# Patient Record
Sex: Female | Born: 1945 | Race: Black or African American | Hispanic: No | Smoking: Never smoker
Health system: Southern US, Community
[De-identification: ages and names within clinical notes are randomized; demographics above are authoritative.]

## PROBLEM LIST (undated history)

## (undated) DIAGNOSIS — E119 Type 2 diabetes mellitus without complications: Secondary | ICD-10-CM

## (undated) DIAGNOSIS — N289 Disorder of kidney and ureter, unspecified: Secondary | ICD-10-CM

## (undated) DIAGNOSIS — H269 Unspecified cataract: Secondary | ICD-10-CM

## (undated) DIAGNOSIS — M1990 Unspecified osteoarthritis, unspecified site: Secondary | ICD-10-CM

## (undated) DIAGNOSIS — K219 Gastro-esophageal reflux disease without esophagitis: Secondary | ICD-10-CM

## (undated) DIAGNOSIS — H539 Unspecified visual disturbance: Secondary | ICD-10-CM

## (undated) DIAGNOSIS — E785 Hyperlipidemia, unspecified: Secondary | ICD-10-CM

## (undated) DIAGNOSIS — I824Z9 Acute embolism and thrombosis of unspecified deep veins of unspecified distal lower extremity: Secondary | ICD-10-CM

## (undated) HISTORY — PX: HYSTERECTOMY: SHX81

## (undated) HISTORY — PX: CHOLECYSTECTOMY: SHX55

## (undated) HISTORY — PX: APPENDECTOMY: SHX54

---

## 2009-12-20 ENCOUNTER — Emergency Department: Admit: 2009-12-20 | Payer: Self-pay | Source: Emergency Department | Admitting: Emergency Medical Services

## 2010-12-22 HISTORY — PX: AV FISTULA PLACEMENT: SHX1204

## 2011-02-03 ENCOUNTER — Ambulatory Visit
Admission: RE | Admit: 2011-02-03 | Disposition: A | Discharge status: Home / Self Care | Payer: Self-pay | Source: Ambulatory Visit | Admitting: Specialist

## 2011-02-03 LAB — CBC
Hematocrit: 31.2 % — ABNORMAL LOW (ref 37.0–47.0)
Hgb: 9.9 g/dL — ABNORMAL LOW (ref 12.0–16.0)
MCH: 28 pg (ref 28.0–32.0)
MCHC: 31.7 g/dL — ABNORMAL LOW (ref 32.0–36.0)
MCV: 88.4 fL (ref 80.0–100.0)
MPV: 11.8 fL (ref 9.4–12.3)
Platelets: 171 10*3/uL (ref 140–400)
RBC: 3.53 10*6/uL — ABNORMAL LOW (ref 4.20–5.40)
RDW: 14 % (ref 12–15)
WBC: 5.87 10*3/uL (ref 3.50–10.80)

## 2011-02-03 LAB — BASIC METABOLIC PANEL
Anion Gap: 11 (ref 5.0–15.0)
BUN: 54 mg/dL — ABNORMAL HIGH (ref 7–21)
CO2: 26 mEq/L (ref 22–31)
Calcium: 9.7 mg/dL (ref 8.6–10.2)
Chloride: 109 mEq/L — ABNORMAL HIGH (ref 98–107)
Creatinine: 2.3 mg/dL — ABNORMAL HIGH (ref 0.5–1.4)
Glucose: 105 mg/dL — ABNORMAL HIGH (ref 70–100)
Potassium: 4.2 mEq/L (ref 3.6–5.0)
Sodium: 146 mEq/L — ABNORMAL HIGH (ref 136–143)

## 2011-02-03 LAB — POTASSIUM WHOLE BLOOD: Whole Blood Potassium: 4 mEq/L (ref 3.6–5.0)

## 2011-02-03 LAB — GFR: EGFR: 25.6

## 2011-04-07 ENCOUNTER — Ambulatory Visit
Admission: RE | Admit: 2011-04-07 | Disposition: A | Discharge status: Home / Self Care | Payer: Self-pay | Source: Ambulatory Visit | Attending: Specialist | Admitting: Specialist

## 2011-06-26 ENCOUNTER — Inpatient Hospital Stay
Admission: AD | Admit: 2011-06-26 | Discharge: 2011-06-30 | Disposition: A | Discharge status: Home / Self Care | Payer: Self-pay | Source: Ambulatory Visit | Attending: Nephrology | Admitting: Nephrology

## 2011-06-27 LAB — COMPREHENSIVE METABOLIC PANEL
ALT: 23 U/L (ref 7–56)
AST (SGOT): 15 U/L (ref 5–40)
Albumin/Globulin Ratio: 1.2 (ref 1.1–1.8)
Albumin: 3.2 g/dL — ABNORMAL LOW (ref 3.7–5.1)
Alkaline Phosphatase: 78 U/L (ref 43–122)
Anion Gap: 9 (ref 5.0–15.0)
BUN: 24 mg/dL — ABNORMAL HIGH (ref 7–21)
Bilirubin, Total: 0.6 mg/dL (ref 0.2–1.3)
CO2: 22 mEq/L (ref 22–31)
Calcium: 8.9 mg/dL (ref 8.6–10.2)
Chloride: 113 mEq/L — ABNORMAL HIGH (ref 98–107)
Creatinine: 2.3 mg/dL — ABNORMAL HIGH (ref 0.5–1.4)
Globulin: 2.6 g/dL (ref 2.0–3.7)
Glucose: 130 mg/dL — ABNORMAL HIGH (ref 70–100)
Potassium: 4.6 mEq/L (ref 3.6–5.0)
Protein, Total: 5.8 g/dL — ABNORMAL LOW (ref 6.0–8.0)
Sodium: 144 mEq/L — ABNORMAL HIGH (ref 136–143)

## 2011-06-27 LAB — CBC AND DIFFERENTIAL
Basophils Absolute Automated: 0.02 10*3/uL (ref 0.00–0.20)
Basophils Automated: 0 % (ref 0–2)
Eosinophils Absolute Automated: 0.15 10*3/uL (ref 0.00–0.70)
Eosinophils Automated: 2 % (ref 0–5)
Hematocrit: 25.4 % — ABNORMAL LOW (ref 37.0–47.0)
Hgb: 8.1 g/dL — ABNORMAL LOW (ref 12.0–16.0)
Immature Granulocytes Absolute: 0.02 10*3/uL
Immature Granulocytes: 0 % (ref 0–1)
Lymphocytes Absolute Automated: 1.75 10*3/uL (ref 0.50–4.40)
Lymphocytes Automated: 24 % (ref 15–41)
MCH: 27.9 pg — ABNORMAL LOW (ref 28.0–32.0)
MCHC: 31.9 g/dL — ABNORMAL LOW (ref 32.0–36.0)
MCV: 87.6 fL (ref 80.0–100.0)
MPV: 10.9 fL (ref 9.4–12.3)
Monocytes Absolute Automated: 0.62 10*3/uL (ref 0.00–1.20)
Monocytes: 9 % (ref 0–11)
Neutrophils Absolute: 4.63 10*3/uL (ref 1.80–8.10)
Neutrophils: 65 % (ref 52–75)
Nucleated RBC: 0 /100 WBC
Platelets: 164 10*3/uL (ref 140–400)
RBC: 2.9 10*6/uL — ABNORMAL LOW (ref 4.20–5.40)
RDW: 15 % (ref 12–15)
WBC: 7.17 10*3/uL (ref 3.50–10.80)

## 2011-06-27 LAB — URINALYSIS WITH MICROSCOPIC
Bilirubin, UA: NEGATIVE
Blood, UA: NEGATIVE
Glucose, UA: NEGATIVE
Ketones UA: NEGATIVE
Leukocyte Esterase, UA: NEGATIVE
Nitrite, UA: NEGATIVE
Specific Gravity UA POCT: 1.009 (ref 1.001–1.035)
Urine pH: 6 (ref 5.0–8.0)
Urobilinogen, UA: NORMAL mg/dL

## 2011-06-27 LAB — PHOSPHORUS: Phosphorus: 3.5 mg/dL (ref 2.5–4.5)

## 2011-06-27 LAB — GFR: EGFR: 25.6

## 2011-06-27 LAB — MAGNESIUM: Magnesium: 1.9 mg/dL (ref 1.6–2.3)

## 2011-06-28 LAB — URINE CULTURE

## 2011-06-28 LAB — CBC AND DIFFERENTIAL
Absolute NRBC: 0 10*3/uL
Basophils Absolute Automated: 0.02 10*3/uL (ref 0.00–0.20)
Basophils Automated: 0 % (ref 0–2)
Eosinophils Absolute Automated: 0.13 10*3/uL (ref 0.00–0.70)
Eosinophils Automated: 2 % (ref 0–5)
Hematocrit: 26.8 % — ABNORMAL LOW (ref 37.0–47.0)
Hgb: 8.6 g/dL — ABNORMAL LOW (ref 12.0–16.0)
Immature Granulocytes Absolute: 0.01 10*3/uL
Immature Granulocytes: 0 % (ref 0–1)
Lymphocytes Absolute Automated: 2.43 10*3/uL (ref 0.50–4.40)
Lymphocytes Automated: 33 % (ref 15–41)
MCH: 28.3 pg (ref 28.0–32.0)
MCHC: 32.1 g/dL (ref 32.0–36.0)
MCV: 88.2 fL (ref 80.0–100.0)
MPV: 11.5 fL (ref 9.4–12.3)
Monocytes Absolute Automated: 0.59 10*3/uL (ref 0.00–1.20)
Monocytes: 8 % (ref 0–11)
Neutrophils Absolute: 4.17 10*3/uL (ref 1.80–8.10)
Neutrophils: 57 % (ref 52–75)
Nucleated RBC: 0 /100 WBC
Platelets: 173 10*3/uL (ref 140–400)
RBC: 3.04 10*6/uL — ABNORMAL LOW (ref 4.20–5.40)
RDW: 15 % (ref 12–15)
WBC: 7.34 10*3/uL (ref 3.50–10.80)

## 2011-06-28 LAB — COMPREHENSIVE METABOLIC PANEL
ALT: 26 U/L (ref 7–56)
AST (SGOT): 17 U/L (ref 5–40)
Albumin/Globulin Ratio: 1.1 (ref 1.1–1.8)
Albumin: 3.4 g/dL — ABNORMAL LOW (ref 3.7–5.1)
Alkaline Phosphatase: 82 U/L (ref 43–122)
Anion Gap: 12 (ref 5.0–15.0)
BUN: 21 mg/dL (ref 7–21)
Bilirubin, Total: 0.6 mg/dL (ref 0.2–1.3)
CO2: 24 mEq/L (ref 22–31)
Calcium: 8.5 mg/dL — ABNORMAL LOW (ref 8.6–10.2)
Chloride: 110 mEq/L — ABNORMAL HIGH (ref 98–107)
Creatinine: 2 mg/dL — ABNORMAL HIGH (ref 0.5–1.4)
Globulin: 3 g/dL (ref 2.0–3.7)
Glucose: 90 mg/dL (ref 70–100)
Potassium: 4.5 mEq/L (ref 3.6–5.0)
Protein, Total: 6.4 g/dL (ref 6.0–8.0)
Sodium: 146 mEq/L — ABNORMAL HIGH (ref 136–143)

## 2011-06-28 LAB — HEPATITIS C ANTIBODY: Hepatitis C, AB: NONREACTIVE

## 2011-06-28 LAB — HEPATITIS B SURFACE ANTIGEN W/ REFLEX TO CONFIRMATION: Hepatitis B Surface Antigen: NONREACTIVE

## 2011-06-28 LAB — PHOSPHORUS: Phosphorus: 4.2 mg/dL (ref 2.5–4.5)

## 2011-06-28 LAB — MAGNESIUM: Magnesium: 1.9 mg/dL (ref 1.6–2.3)

## 2011-06-28 LAB — GFR: EGFR: 30.1

## 2011-06-29 LAB — CBC AND DIFFERENTIAL
Basophils Absolute Automated: 0.02 10*3/uL (ref 0.00–0.20)
Basophils Automated: 0 % (ref 0–2)
Eosinophils Absolute Automated: 0.16 10*3/uL (ref 0.00–0.70)
Eosinophils Automated: 2 % (ref 0–5)
Hematocrit: 29.9 % — ABNORMAL LOW (ref 37.0–47.0)
Hgb: 9.5 g/dL — ABNORMAL LOW (ref 12.0–16.0)
Immature Granulocytes Absolute: 0.01 10*3/uL
Immature Granulocytes: 0 % (ref 0–1)
Lymphocytes Absolute Automated: 2.26 10*3/uL (ref 0.50–4.40)
Lymphocytes Automated: 29 % (ref 15–41)
MCH: 27.8 pg — ABNORMAL LOW (ref 28.0–32.0)
MCHC: 31.8 g/dL — ABNORMAL LOW (ref 32.0–36.0)
MCV: 87.4 fL (ref 80.0–100.0)
MPV: 11.4 fL (ref 9.4–12.3)
Monocytes Absolute Automated: 0.76 10*3/uL (ref 0.00–1.20)
Monocytes: 10 % (ref 0–11)
Neutrophils Absolute: 4.65 10*3/uL (ref 1.80–8.10)
Neutrophils: 59 % (ref 52–75)
Nucleated RBC: 1 /100 WBC
Platelets: 180 10*3/uL (ref 140–400)
RBC: 3.42 10*6/uL — ABNORMAL LOW (ref 4.20–5.40)
RDW: 15 % (ref 12–15)
WBC: 7.85 10*3/uL (ref 3.50–10.80)

## 2011-06-29 LAB — COMPREHENSIVE METABOLIC PANEL
ALT: 24 U/L (ref 7–56)
AST (SGOT): 17 U/L (ref 5–40)
Albumin/Globulin Ratio: 1.3 (ref 1.1–1.8)
Albumin: 3.7 g/dL (ref 3.7–5.1)
Alkaline Phosphatase: 94 U/L (ref 43–122)
Anion Gap: 11 (ref 5.0–15.0)
BUN: 20 mg/dL (ref 7–21)
Bilirubin, Total: 0.7 mg/dL (ref 0.2–1.3)
CO2: 26 mEq/L (ref 22–31)
Calcium: 8.6 mg/dL (ref 8.6–10.2)
Chloride: 106 mEq/L (ref 98–107)
Creatinine: 1.9 mg/dL — ABNORMAL HIGH (ref 0.5–1.4)
Globulin: 2.9 g/dL (ref 2.0–3.7)
Glucose: 141 mg/dL — ABNORMAL HIGH (ref 70–100)
Potassium: 4.5 mEq/L (ref 3.6–5.0)
Protein, Total: 6.6 g/dL (ref 6.0–8.0)
Sodium: 143 mEq/L (ref 136–143)

## 2011-06-29 LAB — MAGNESIUM: Magnesium: 1.9 mg/dL (ref 1.6–2.3)

## 2011-06-29 LAB — PHOSPHORUS: Phosphorus: 4.2 mg/dL (ref 2.5–4.5)

## 2011-06-29 LAB — GFR: EGFR: 31.9

## 2011-06-30 LAB — COMPREHENSIVE METABOLIC PANEL
ALT: 29 U/L (ref 7–56)
AST (SGOT): 34 U/L (ref 5–40)
Albumin/Globulin Ratio: 1.4 (ref 1.1–1.8)
Albumin: 3.8 g/dL (ref 3.7–5.1)
Alkaline Phosphatase: 87 U/L (ref 43–122)
Anion Gap: 13 (ref 5.0–15.0)
BUN: 20 mg/dL (ref 7–21)
Bilirubin, Total: 0.6 mg/dL (ref 0.2–1.3)
CO2: 26 mEq/L (ref 22–31)
Calcium: 8.7 mg/dL (ref 8.6–10.2)
Chloride: 102 mEq/L (ref 98–107)
Creatinine: 2 mg/dL — ABNORMAL HIGH (ref 0.5–1.4)
Globulin: 2.8 g/dL (ref 2.0–3.7)
Glucose: 183 mg/dL — ABNORMAL HIGH (ref 70–100)
Potassium: 4.5 mEq/L (ref 3.6–5.0)
Protein, Total: 6.6 g/dL (ref 6.0–8.0)
Sodium: 141 mEq/L (ref 136–143)

## 2011-06-30 LAB — PHOSPHORUS: Phosphorus: 4.4 mg/dL (ref 2.5–4.5)

## 2011-06-30 LAB — CBC AND DIFFERENTIAL
Basophils Absolute Automated: 0.02 10*3/uL (ref 0.00–0.20)
Basophils Automated: 0 % (ref 0–2)
Eosinophils Absolute Automated: 0.14 10*3/uL (ref 0.00–0.70)
Eosinophils Automated: 2 % (ref 0–5)
Hematocrit: 28.5 % — ABNORMAL LOW (ref 37.0–47.0)
Hgb: 9.2 g/dL — ABNORMAL LOW (ref 12.0–16.0)
Immature Granulocytes Absolute: 0.01 10*3/uL
Immature Granulocytes: 0 % (ref 0–1)
Lymphocytes Absolute Automated: 2.13 10*3/uL (ref 0.50–4.40)
Lymphocytes Automated: 25 % (ref 15–41)
MCH: 28.1 pg (ref 28.0–32.0)
MCHC: 32.3 g/dL (ref 32.0–36.0)
MCV: 87.2 fL (ref 80.0–100.0)
MPV: 10.9 fL (ref 9.4–12.3)
Monocytes Absolute Automated: 0.69 10*3/uL (ref 0.00–1.20)
Monocytes: 8 % (ref 0–11)
Neutrophils Absolute: 5.39 10*3/uL (ref 1.80–8.10)
Neutrophils: 64 % (ref 52–75)
Nucleated RBC: 0 /100 WBC
Platelets: 164 10*3/uL (ref 140–400)
RBC: 3.27 10*6/uL — ABNORMAL LOW (ref 4.20–5.40)
RDW: 15 % (ref 12–15)
WBC: 8.37 10*3/uL (ref 3.50–10.80)

## 2011-06-30 LAB — MAGNESIUM: Magnesium: 1.9 mg/dL (ref 1.6–2.3)

## 2011-06-30 LAB — GFR: EGFR: 30.1

## 2011-08-15 ENCOUNTER — Ambulatory Visit
Admission: RE | Admit: 2011-08-15 | Discharge: 2011-08-15 | Disposition: A | Discharge status: Home / Self Care | Payer: Self-pay | Source: Ambulatory Visit | Attending: Specialist | Admitting: Specialist

## 2011-09-02 LAB — ECG 12-LEAD
Atrial Rate: 74 {beats}/min
P Axis: 60 degrees
P-R Interval: 168 ms
Q-T Interval: 398 ms
QRS Duration: 88 ms
QTC Calculation (Bezet): 441 ms
R Axis: -20 degrees
T Axis: 43 degrees
Ventricular Rate: 74 {beats}/min

## 2011-09-04 LAB — ECG 12-LEAD
Atrial Rate: 75 {beats}/min
P Axis: 44 degrees
P-R Interval: 138 ms
Q-T Interval: 406 ms
QRS Duration: 82 ms
QTC Calculation (Bezet): 453 ms
R Axis: -23 degrees
T Axis: 17 degrees
Ventricular Rate: 75 {beats}/min

## 2011-10-22 NOTE — Op Note (Signed)
Mary [name], Mary [name]      MRN:          [id number]      Account:      [id number]      Document ID:  [id number]      Procedure Date: 04/07/2011            Admit Date: 04/07/2011            Patient Location: DISCHARGED 04/07/2011      Patient Type: A            SURGEON: Shanira Tine A Djuna Frechette MD      ASSISTANT:                  PREOPERATIVE DIAGNOSIS:      Chronic renal failure.            POSTOPERATIVE DIAGNOSIS:      Chronic renal failure.            TITLE OF PROCEDURE:      Left cephalic vein transposition.            ANESTHESIA:      Local sedation.            BRIEF HISTORY:      This is a 65 year old female with history of chronic renal failure who has      a left proximal radial arteriovenous fistula formation; however, a cephalic      vein which is maturing, but it is very deep.  Therefore, decision was made      to proceed with the above procedure.  The diameter of cephalic vein is      marginal.  The patient may require a patch angioplasty at a later date.            DESCRIPTION OF PROCEDURE:      The patient was brought to the suite.  Ultrasound was used, and the      cephalic vein was marked from elbow up to the shoulder.  Then, local      anesthesia was administered, 3 small incisions were made after the arm was      prepped and draped in sterile fashion.  Then incisions were made and the      cephalic vein was dissected out.  The cephalic vein was marginal      approximately 6 mm.  Near the arterial anastomosis the vein was clamped and      was divided in a beveled fashion.  There were some neointimal hyperplasia      formation in this area.  The vein was brought out of its bed.  All the side      branches were ligated using Hemoclips.  The vein was dilated and again      measured approximately 5 to 6 mm.  A tunnel was created after local      anesthesia was administered in a subcutaneous fashion, and then a      venovenostomy was created using 7-0 PDS using 2 separate sutures in a      running fashion.  Flow  was restored.  There was reasonable thrill in the      fistula.  There was no evidence of bleeding.  Wounds were closed using 3-0      Monocryl in a running fashion.  Skin was closed using 4-0 Monocryl in      subcuticular fashion.  Steri-Strips were applied.  Patient tolerated the  Page 1 of 2      NORMANI, BORNTREGER      MRN:          [id number]      Account:      [id number]      Document ID:  [id number]      Procedure Date: 04/07/2011            procedure well.  Needle count, sponge count was correct.  She was      transferred to the recovery room.                        Electronic Signing Provider            D:  04/07/2011 15:13 PM by Dr. Ann Lions A. Lutricia Feil, MD ([id number])      T:  04/07/2011 22:05 PM by [id number]                  [id number] Ikhinmwin MD                                   Page 2 of 2      Authenticated by [facility] A. Lutricia Feil, MD ([id number]) On 04/09/2011 10:56:25 AM

## 2011-10-22 NOTE — Op Note (Signed)
Mary [name], Mary [name]      MRN:          [id number]      Account:      [id number]      Document ID:  [id number]      Procedure Date: 02/03/2011            Admit Date: 02/03/2011            Patient Location: DISCHARGED 02/03/2011      Patient Type: A            SURGEON: Jakaya Jacobowitz A Bindi Klomp MD      ASSISTANT:                  PREOPERATIVE DIAGNOSIS:      Chronic renal failure.            POSTOPERATIVE DIAGNOSIS:      Chronic renal failure.            TITLE OF PROCEDURE:      Creation of left proximal radial arteriovenous fistula.            ANESTHESIA:      Local with sedation.            BRIEF HISTORY:      This is a 65 year old female with history of chronic renal failure who is      in need of long-term dialysis access.  Therefore, the decision was made to      proceed with the above procedure.  Risks and benefits were stated and      agreed.            DESCRIPTION OF PROCEDURE:      The patient was brought to the operating suite, was placed in supine      position.  Left arm was ultrasounded and the antebrachial vein appeared to      be patent and feeding into the cephalic vein.  The basilic vein was small      and possibly had some communication.  Then, we proceeded with prepping and      draping the left arm.  A small incision was made in the antecubital area.      Antebrachial vein was dissected out and ligated using Hemoclips.  The vein      was dilated and appeared to be of reasonable size.  Then, proximal radial      artery, which had a reasonable pulse, was dissected out.  Then, we      proceeded with applying bulldog clamps to the radial artery proximal and      distal arteries and then arteriotomy was made using 11 blade and an      end-to-side anastomosis was created using 7-0 PDS in a running fashion.      Flow was restored.  There was a reasonable thrill in the fistula.  The vein      was set still somewhat in spasm.  Then there was no evidence of bleeding.      Wound was closed using 3-0 Monocryl in  running fashion.  Skin was closed      using 4-0 Monocryl in subcuticular fashion.  Steri-Strips were applied.      The patient tolerated the procedure well, was transferred to recovery room.  Page 1 of 2      LEANDREA, SCAGNELLI      MRN:          [id number]      Account:      [id number]      Document ID:  [id number]      Procedure Date: 02/03/2011                        Electronic Signing Provider            D:  02/03/2011 15:31 PM by Dr. Ann Lions A. Lutricia Feil, MD ([id number])      T:  02/03/2011 22:40 PM by [id number]                  cc:                                   Page 2 of 2      Authenticated by [facility] A. Lutricia Feil, MD ([id number]) On 02/05/2011 08:00:53 AM

## 2011-11-08 NOTE — H&P (Signed)
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  The patient was admitted to [facility] because of increasing shortness of breath, difficulty      breathing, rapid increasing weight over the past couple of months of about      25 to 30 pounds, significant swelling of the bilateral lower extremities      resulting in extreme difficulty in the patient ambulating or wearing shoes,      pain in lower extremities because of the swelling.  The patient's      creatinine clearance has recently dropped to about 15 to 20 mL per minute.      The patient had a left arm AV fistula created by Dr. Lutricia Feil on February 03, 2011.  Left arm AV fistula is functioning well, has good bruit and      thrill.  The patient has agreed to be started on hemodialysis.  Plan is for      patient to be initiated on hemodialysis during this admission.  The patient      denies any chest pain at this time.            PAST MEDICAL HISTORY:      Notable for end-stage kidney failure secondary to diabetic nephropathy and      hypertensive nephrosclerosis, history of hypertension, history of diabetes      mellitus, history of hyperlipidemia, history of a hematological disorder.            ALLERGIES:      The patient has allergy to PENICILLIN.            SOCIAL HISTORY:      The patient does not smoke, drink, or use drugs.            MEDICATIONS:      At home include Glimepiride 2 mg daily, Norvasc 10 mg daily, Carvita  150 mg      daily, Nexium 40 mg daily, Citracal 1 tablet daily, aspirin 81 mg daily,      sodium bicarbonate 650 mEq twice a day, Torsemide 20 mg daily, Lyrica 75 mg      twice a day, Lipitor 10 mg daily.            REVIEW OF SYSTEMS:      Notable for shortness of breath, difficulty breathing, bilateral lower      extremity swelling, weakness.  No chest pain, no nausea, no vomiting.  Page 1 of 2      Mary [name], Mary [name]      MRN:          [id number]      Account:      [id number]      Document ID:  [id number]                        PHYSICAL EXAMINATION:      GENERAL:  Elderly woman lying in bed in no acute respiratory distress.      VITAL SIGNS:  Blood pressure 168/65, temperature of 98.9, pulse of 82,      respiratory rate of 18.      HEENT:  Normocephalic, atraumatic.      NECK:  Supple.      LUNGS:  Decreased breath sounds.      CARDIOVASCULAR:  Normal first and second heart sounds, no S3, no S4.      ABDOMEN:  Soft, slightly obese, nontender, no masses, normal bowel sounds.      EXTREMITIES:  4+ bilateral lower extremity edema.            LABORATORY DATA:      WBC of 7.17, hematocrit of 25.4, platelet of 164.  Urinalysis was positive      for protein.  _____ pending.  Creatinine clearance is about 15 mL per      minute.            IMPRESSION:      1. End-stage kidney failure.      2.  Anemia.            PLAN:      The patient is admitted to the medical floor. We will start patient on      Aranesp 100 mcg ever week.  We will initiate hemodialysis. We will resume      patient's other medications.  I have discussed plan of care with patient      and the patient's family.                                    D:  06/27/2011 09:09 AM by Dr. Gwenith Daily. Leonette Most, MD [phone])      T:  06/27/2011 09:39 AM by [id number]                  cc:                                   Page 2 of 2      Authenticated by Gwenith Daily. Leonette Most, MD [phone]) On 06/27/2011 11:04:42 AM

## 2011-11-08 NOTE — Op Note (Signed)
Mary [name], Mary [name]      MRN:          [id number]      Account:      [id number]      Document ID:  [id number]      Procedure Date: 08/15/2011            Admit Date: 08/15/2011            Patient Location: DISCHARGED 08/15/2011      Patient Type: A            SURGEON: Emaly Boschert A Mariadelosang Wynns MD      ASSISTANT:                  PREOPERATIVE DIAGNOSIS:      Chronic renal failure with non-matured left arm cephalic arteriovenous      fistula.            POSTOPERATIVE DIAGNOSIS:      Chronic renal failure with non-matured left arm cephalic arteriovenous      fistula.            TITLE OF PROCEDURE:      Revision of a left cephalic vein transposition.            PROCEDURES:      Revision of left cephalic vein transposition with patch angioplasty.            ASSISTING SURGEON:      Vecente Campoverde.            BRIEF HISTORY:      This is a 65 year old female with history of chronic renal failure who had      undergone left cephalic vein transposition.  There has been difficulty      accessing the cephalic vein; therefore, decision was made to proceed with      patch angioplasty more proximal aspect of the vein which is rather deep.      Risks and benefits were stated and agreed.            DESCRIPTION OF PROCEDURE:      The patient was brought to the operating suite.  No IV could be obtained by      the anesthesia staff.  Therefore, under ultrasound in sterile technique, a      right brachial IV was inserted.  Two grams of Ancef were given      intravenously.  Left arm was prepped and draped in sterile fashion.  Local      anesthesia was administered, and in the mid upper arm approximately 8 cm of      the cephalic vein, which was rather diseased because of multiple previous      puncture sites and hematoma was dissected out, 5000 units of heparin were      given intravenously.  Then, the cephalic vein was clamped using a #5      [place]ogarty and centrally after it flushed was clamped using a bulldog clamp      and then a 2-cm  pericardial patch was used with 6-0 Prolene in running      fashion, and patch angioplasty was performed.  Then, flow was restored.                                   Page 1 of 2      AIDEL, BINGAMAN      MRN:  [id number]      Account:      [id number]      Document ID:  [id number]      Procedure Date: 08/15/2011            There was excellent thrill in the fistula.  50 mg of protamine were given      intravenously and then excess fat in the subcutaneous tissue was removed      and we proceeded with raising flaps and skin was closed using 3-0 Monocryl      and Indermil.  Arm was wrapped a sterile dressings applied.  The patient      tolerated the procedure well, was transferred to the recovery room.                        Electronic Signing Provider            D:  08/15/2011 17:44 PM by Dr. Ann Lions A. Lutricia Feil, MD ([id number])      T:  08/16/2011 12:03 PM by [id number]                  cc:     Emelda Fear MD                                   Page 2 of 2      Authenticated and Edited by Trixie Dredge. Lutricia Feil, MD ([id number]) On 08/18/11 8:50:31 AM

## 2011-11-08 NOTE — Discharge Summary (Signed)
Mary [name], Mary [name]      MRN:          [id number]      Account:      [id number]      Document ID:  [id number]                  Admit Date: 06/26/2011      Discharge Date: 06/30/2011            ATTENDING PHYSICIAN:  Emelda Fear, MD                  DISCHARGE DIAGNOSIS:      End-stage kidney failure.            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  The patient      also has a hematological disorder.  The patient was admitted to [facility] on June 26, 2011 because of shortness of breath, difficulty      breathing, swelling of the lower extremities resulting in difficulty with      ambulation.  The patient had gained about 20 pounds within the past month.      The patient had a creatinine clearance done, which was less than 20 mL per      minute.  The patient had a left arm AV fistula created in February 2012.      The fistula was functioning well by the time of admission.  Hemodialysis      was initiated via the left arm AV fistula.            After the patient was admitted to the medical floor, the patient tolerated      hemodialysis very well.  During this admission, the patient also was      evaluated by the social work service.  An outpatient dialysis facility was      identified.  The patient was subsequently placed in the outpatient      hemodialysis facility.  Plan was for patient to resume hemodialysis in the      outpatient clinic on Tuesday, Thursday, and Saturday.  At the time of      discharge, the patient was feeling better.  Swelling of the lower      extremities has improved.  Shortness of breath and difficulty breathing      also has resolved.            DISCHARGE MEDICATIONS:      Glimepiride 2 mg daily, Norvasc 10 mg daily, Corvita 150 mg daily, Nexium      40 mg daily, Citracal 1 tablet  daily, aspirin 81 mg daily, sodium      bicarbonate 5 grams b.i.d., torsemide 20 mg daily, Lyrica 75 mg twice a      day, Lipitor 10 mg nightly.            PLAN ON DISCHARGE:      The patient was to follow up in the outpatient dialysis clinic in 1 week.            DISCHARGE DIAGNOSIS:      As listed above.  Page 1 of 2      Mary [name], Mary [name]      MRN:          [id number]      Account:      [id number]      Document ID:  [id number]                                    D:  07/09/2011 10:54 AM by Dr. Gwenith Daily. Leonette Most, MD [phone])      T:  07/09/2011 12:24 PM by BS                        cc:                                   Page 2 of 2      Authenticated by Gwenith Daily. Leonette Most, MD [phone]) On 07/14/2011 11:05:25 AM

## 2011-11-17 ENCOUNTER — Emergency Department
Admit: 2011-11-17 | Discharge: 2011-11-17 | Disposition: A | Discharge status: Home / Self Care | Payer: Self-pay | Source: Emergency Department

## 2011-11-17 LAB — ECG 12-LEAD
Atrial Rate: 88 {beats}/min
P Axis: 68 degrees
P-R Interval: 152 ms
Q-T Interval: 368 ms
QRS Duration: 88 ms
QTC Calculation (Bezet): 445 ms
R Axis: -18 degrees
T Axis: 41 degrees
Ventricular Rate: 88 {beats}/min

## 2011-12-23 DIAGNOSIS — N186 End stage renal disease: Secondary | ICD-10-CM

## 2011-12-23 DIAGNOSIS — I82419 Acute embolism and thrombosis of unspecified femoral vein: Secondary | ICD-10-CM | POA: Insufficient documentation

## 2011-12-23 HISTORY — DX: End stage renal disease: N18.6

## 2011-12-31 ENCOUNTER — Encounter
Admission: RE | Disposition: A | Discharge status: Home / Self Care | Payer: Self-pay | Source: Ambulatory Visit | Attending: Specialist

## 2011-12-31 ENCOUNTER — Ambulatory Visit: Payer: Medicare Other | Admitting: Specialist

## 2011-12-31 ENCOUNTER — Ambulatory Visit
Admission: RE | Admit: 2011-12-31 | Discharge: 2011-12-31 | Disposition: A | Discharge status: Home / Self Care | Payer: Medicare Other | Source: Ambulatory Visit | Attending: Specialist | Admitting: Specialist

## 2011-12-31 DIAGNOSIS — N186 End stage renal disease: Secondary | ICD-10-CM | POA: Insufficient documentation

## 2011-12-31 DIAGNOSIS — E785 Hyperlipidemia, unspecified: Secondary | ICD-10-CM | POA: Insufficient documentation

## 2011-12-31 DIAGNOSIS — E1129 Type 2 diabetes mellitus with other diabetic kidney complication: Secondary | ICD-10-CM | POA: Insufficient documentation

## 2011-12-31 DIAGNOSIS — I871 Compression of vein: Secondary | ICD-10-CM | POA: Insufficient documentation

## 2011-12-31 DIAGNOSIS — T82898A Other specified complication of vascular prosthetic devices, implants and grafts, initial encounter: Secondary | ICD-10-CM | POA: Insufficient documentation

## 2011-12-31 DIAGNOSIS — Z992 Dependence on renal dialysis: Secondary | ICD-10-CM | POA: Insufficient documentation

## 2011-12-31 DIAGNOSIS — Y832 Surgical operation with anastomosis, bypass or graft as the cause of abnormal reaction of the patient, or of later complication, without mention of misadventure at the time of the procedure: Secondary | ICD-10-CM | POA: Insufficient documentation

## 2011-12-31 DIAGNOSIS — I120 Hypertensive chronic kidney disease with stage 5 chronic kidney disease or end stage renal disease: Secondary | ICD-10-CM | POA: Insufficient documentation

## 2011-12-31 DIAGNOSIS — N058 Unspecified nephritic syndrome with other morphologic changes: Secondary | ICD-10-CM | POA: Insufficient documentation

## 2011-12-31 HISTORY — DX: Hyperlipidemia, unspecified: E78.5

## 2011-12-31 HISTORY — DX: Unspecified cataract: H26.9

## 2011-12-31 HISTORY — DX: Gastro-esophageal reflux disease without esophagitis: K21.9

## 2011-12-31 HISTORY — DX: Unspecified osteoarthritis, unspecified site: M19.90

## 2011-12-31 LAB — POCT GLUCOSE: Whole Blood Glucose POCT: 187 mg/dL — AB (ref 70–100)

## 2011-12-31 SURGERY — VENOUS - UPPER FISTULA ANGIO; POSS PTA &/OR THROMBO
Anesthesia: Conscious Sedation | Laterality: Left

## 2011-12-31 MED ORDER — LIDOCAINE HCL (PF) 1 % IJ SOLN
INTRAMUSCULAR | Status: AC
Start: 2011-12-31 — End: 2011-12-31
  Administered 2011-12-31: 10 mL
  Filled 2011-12-31: qty 1

## 2011-12-31 MED ORDER — SODIUM CHLORIDE 0.9 % IV SOLN
INTRAVENOUS | Status: DC
Start: 2011-12-31 — End: 2011-12-31

## 2011-12-31 MED ORDER — HEPARIN WASH BOWL 5 UNITS/ML SOLN (CATH LAB)
Status: AC
Start: 2011-12-31 — End: 2011-12-31
  Filled 2011-12-31: qty 1

## 2011-12-31 MED ORDER — IODIXANOL 320 MG/ML IV SOLN
15.00 mL | Freq: Once | INTRAVENOUS | Status: AC
Start: 2011-12-31 — End: 2011-12-31
  Administered 2011-12-31: 15 mL via INTRAMUSCULAR
  Filled 2011-12-31: qty 50

## 2011-12-31 MED ORDER — ACETAMINOPHEN 325 MG PO TABS
650.0000 mg | ORAL_TABLET | ORAL | Status: DC | PRN
Start: 2011-12-31 — End: 2011-12-31

## 2011-12-31 NOTE — Progress Notes (Signed)
Unable to start peripheral IV or get CBC, Dr. Lutricia Feil aware. States we do not need PIV and has obtained CBC from Dialysis center.

## 2011-12-31 NOTE — H&P (Signed)
PLEASE SEE PREVIOUS H&P DONE BY DR HASHEMI ON 10/07/11 FOR ADDITIONAL INFORMATION IN CHART.    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 The patient   also has a hematological disorder. The patient was admitted to [facility] on June 26, 2011 because of shortness of breath, difficulty   breathing, swelling of the lower extremities resulting in difficulty with   Ambulation.     NOW WITH AV ACCESS POOR HEMODIALYSIS DUE TO OUTFLOW STENOSIS    MEDS: SEE LIST    ALL: NKDA    ROS: DENIES ALL    PE: A/O X3 NAD  LUE AVF PULSES WITH    THRILL    A/P: 66 YO F  AVSS WITH L AVF STENOSIS AND PENDING FISTULOGRAM.

## 2011-12-31 NOTE — Discharge Summary (Signed)
S/p LUE AV graftogram, s/p balloon angioplasty x2, tolerated well.

## 2011-12-31 NOTE — Discharge Instructions (Signed)
You may resume all prior medications. Take Tylenol for pain as needed.    You may use the graft as soon as needed.    Follow up with Dr. Lutricia Feil in 4 weeks. Please get an Ultrasound of the left arm graft prior to the appointment. A prescription order for the Ultrasound will be provided upon discharge.

## 2011-12-31 NOTE — Progress Notes (Signed)
Discharge instructions reviewed with pt.  Pt verbalizes understanding.  Pt ambulates around unit.  Pt tele d/c, pt did not have PIV.

## 2012-01-01 NOTE — Op Note (Signed)
Procedure Date: 12/31/2011     Patient Type: A     SURGEON: Charlyne Robertshaw A Divante Kotch MD  ASSISTANT:  Wendee Copp MD     PREOPERATIVE DIAGNOSIS:  Chronic renal failure with stenosis, left cephalic vein transposition.     POSTOPERATIVE DIAGNOSIS:  Chronic renal failure with stenosis, left cephalic vein transposition.     TITLE OF PROCEDURE:  1.  Intraoperative ultrasound.  2.  Left arm fistulogram.  3.  Venogram of central venous system.  4.  Balloon angioplasty, left cephalic vein.     ANESTHESIA:  Local.     BRIEF HISTORY:  The patient has history of chronic renal failure and has a left arm  cephalic vein transposition.  The patient had undergone ultrasound  examination which revealed evidence of significant stenosis a few  centimeters central to the arterial anastomosis.  Therefore, decision was  made to proceed with the above procedure.  Risks and benefits were stated  and agreed.     DESCRIPTION OF PROCEDURE:  The patient was brought to the catheterization lab.  They could not start  IV.  Therefore, I decided to use the ultrasound and a micropuncture was  used after left arm was prepped in sterile fashion and left arm was  cannulated with a micropuncture.  At this time, fistulogram was obtained,  which showed 2 areas of stenoses, one a few centimeters proximal to the  arterial anastomosis and one in the upper arm.  The cephalic arch also had  some degree of very mild stenosis with a few collaterals but did not appear  to be hemodynamically significant.  The subclavian vein was visualized and  appeared to be patent.  Then, venogram of the innominate vein and proximal  superior vena cava was obtained and appeared to be patent.  At this time, a  6-French sheath was inserted, 0.035 wire was placed and a 9 mm x 4 cm  Mustang balloon was used and both areas of stenoses were balloon  angioplastied.  There was no significant waisting.  Therefore, we went up  to a 12 x 4 Mustang balloon and there appeared to be some waisting,  which  was eliminated.  At this time there was excellent thrill in the fistula.   Followup fistulogram revealed the more central stenosis to be much better,  and more distal one appeared there could be some tortuosity involving the  vein.  At this time, the patient had excellent thrill, and since there was  no more waisting, we decided to leave this alone and 3-0 Monocryl  pursestring suture was placed and sheath was removed.  The patient  tolerated the procedure well, was transferred to the recovery room.           D:  12/31/2011 12:41 PM by Dr. Ann Lions A. Lutricia Feil, MD ([id number])  T:  12/31/2011 19:45 PM by [id number]      Lorin [id number]) (Doc ID: [id number])

## 2012-08-02 ENCOUNTER — Emergency Department
Admission: EM | Admit: 2012-08-02 | Discharge: 2012-08-02 | Disposition: A | Discharge status: Home / Self Care | Payer: Medicare Other | Attending: Emergency Medicine | Admitting: Emergency Medicine

## 2012-08-02 ENCOUNTER — Emergency Department: Payer: Medicare Other

## 2012-08-02 DIAGNOSIS — K219 Gastro-esophageal reflux disease without esophagitis: Secondary | ICD-10-CM | POA: Insufficient documentation

## 2012-08-02 DIAGNOSIS — N186 End stage renal disease: Secondary | ICD-10-CM | POA: Insufficient documentation

## 2012-08-02 DIAGNOSIS — I120 Hypertensive chronic kidney disease with stage 5 chronic kidney disease or end stage renal disease: Secondary | ICD-10-CM | POA: Insufficient documentation

## 2012-08-02 DIAGNOSIS — M7989 Other specified soft tissue disorders: Secondary | ICD-10-CM | POA: Insufficient documentation

## 2012-08-02 DIAGNOSIS — M19079 Primary osteoarthritis, unspecified ankle and foot: Secondary | ICD-10-CM | POA: Insufficient documentation

## 2012-08-02 DIAGNOSIS — E7800 Pure hypercholesterolemia, unspecified: Secondary | ICD-10-CM | POA: Insufficient documentation

## 2012-08-02 DIAGNOSIS — E119 Type 2 diabetes mellitus without complications: Secondary | ICD-10-CM | POA: Insufficient documentation

## 2012-08-02 MED ORDER — TRAMADOL HCL 50 MG PO TABS
50.00 mg | ORAL_TABLET | Freq: Four times a day (QID) | ORAL | Status: DC | PRN
Start: 2012-08-02 — End: 2018-04-08

## 2012-08-02 MED ORDER — TRAMADOL HCL 50 MG PO TABS
50.00 mg | ORAL_TABLET | Freq: Once | ORAL | Status: AC
Start: 2012-08-02 — End: 2012-08-02
  Administered 2012-08-02: 50 mg via ORAL
  Filled 2012-08-02: qty 1

## 2012-08-02 NOTE — ED Provider Notes (Signed)
[facility] EMERGENCY DEPARTMENT HISTORY AND PHYSICAL EXAM    Date: 08/02/2012  Patient Name: Mary [name]  Attending Physician: Jerline Pain, MD  Patient DOB:  18-Jun-1946  MRN:  [id number]  Room:  06/A06    Patient was evaluated by ED physician, Jerline Pain, MD at 3:56 PM    History     Chief Complaint   Patient presents with   . Leg Pain     right       Onset:   10 days  Severity:  moderate      The patient Mary [name], is a 66 y.o. female who presents with chief complaint of gradually worsening RLE pain and swelling for 10 days. Pt has a h/o ESRD on HD, remote h/o DVT, DM, HTN and chronic BLE swelling. Pt saw her podiatrist for sx 1 wk ago and had an ankle cortisone injection without improvement.  Pt referred to ED to r/o DVT. Pt denies any SOB, no back pain, no focal weakness.    PCP:  Alric Quan, MD  Podiatry: Dr Araceli Bouche      Past Medical History       Past Medical History   Diagnosis Date   . Diabetes mellitus without complication    . Hyperlipidemia    . Hypertensive disorder    . Chronic kidney disease      dialysis t-tr-sat   . Arthritis    . Cataracts, bilateral    . GERD (gastroesophageal reflux disease)          Past Surgical History       Past Surgical History   Procedure Date   . Av fistula placement 2012         Family History    History reviewed. No pertinent family history.    Social History    History     Social History   . Marital Status: Widowed     Spouse Name: N/A     Number of Children: N/A   . Years of Education: N/A     Social History Main Topics   . Smoking status: Never Smoker    . Smokeless tobacco: None   . Alcohol Use: No   . Drug Use: No   . Sexually Active:      Other Topics Concern   . None     Social History Narrative   . None       Allergies    Allergies   Allergen Reactions   . Penicillins Hives   . Shrimp (Shellfish Allergy) Swelling         Current/Home Medications    Current/Home Medications    ACETAMINOPHEN (TYLENOL) 500 MG TABLET    Take 500 mg by mouth every  6 (six) hours as needed.      AMLODIPINE (NORVASC) 10 MG TABLET    Take 10 mg by mouth daily.      ASPIRIN 81 MG TABLET    Take 81 mg by mouth daily.      ATORVASTATIN (LIPITOR) 10 MG TABLET    Take 10 mg by mouth daily.      B COMPLEX-VITAMIN C-FOLIC ACID (NEPHRO-VITE) 0.8 MG TABS    Take 0.8 mg by mouth.    BISACODYL (DULCOLAX) 5 MG EC TABLET    Take 5 mg by mouth daily as needed.      ESOMEPRAZOLE (NEXIUM) 40 MG CAPSULE    Take 40 mg by mouth every morning before breakfast.  INSULIN ASPART (NOVOLOG) 100 UNIT/ML INJECTION    Inject 1-10 Units into the skin 3 (three) times daily before meals.      INSULIN GLARGINE (LANTUS) 100 UNIT/ML INJECTION    Inject 10-20 Units into the skin nightly.      PREGABALIN (LYRICA) 75 MG CAPSULE    Take 75 mg by mouth 2 (two) times daily.         Vital Signs     BP 140/64  Pulse 72  Temp(Src) 98 F (36.7 C) (Temporal Artery)  Resp 18  Ht 1.6 m  Wt 94.5 kg  BMI 36.91 kg/m2  SpO2 98%  Patient Vitals for the past 24 hrs:   BP Temp Temp src Pulse Resp SpO2 Height Weight   08/02/12 1720 140/64 mmHg - - 72  18  98 % - -   08/02/12 1536 148/64 mmHg 98 F (36.7 C) Temporal Art 83  16  97 % 1.6 m 94.5 kg         Review of Systems   Review of Systems   Constitutional: Negative for fever.   Musculoskeletal: Positive for myalgias. Negative for back pain and falls.   Neurological: Negative for weakness.   All other systems reviewed and are negative.        Physical Exam     CONSTITUTIONAL  Patient is afebrile, Vital signs reviewed, Well appearing,  Patient appears uncomfortable, Alert and oriented X 3  HEAD   Atraumatic, Normocephalic.  EYES   Pupils equal, round and reactive to light, Extraocular muscles intact.  ENT   Posterior pharynx normal, Mouth normal to inspection.  NECK   Normal ROM, Cervical spine nontender.  RESPIRATORY CHEST   Chest is nontender, Breath sounds normal, No respiratory distress.  CARDIOVASCULAR   RRR, Normal S1 S2. + systolic murmur 2/6.  ABDOMEN   Abdomen  is nontender, No peritoneal signs.  UPPER EXTREMITY   Inspection normal, No cyanosis.  LOWER EXTREMITY   No cyanosis. FP 3/4 b/l, chronic stasis changes BLE with moderate edema, 3+ LLE and ankle, 2+ RLE. TTP of medial L ankle and dorsal foot, no calf tenderness, no induration or fluctuance.  NEURO  No focal motor deficits, CNs intact, Speech normal.  SKIN   Skin is warm, Skin is dry, Skin is normal color.  LYMPHATIC   No adenopathy in neck.  PSYCHIATRIC   Oriented X 3, Normal affect, Normal insight, Normal concentration.      ED Medication Orders     ED Medication Orders      Start     Status Ordering Provider    08/02/12 1701   traMADOL (ULTRAM) tablet 50 mg   Once      Route: Oral  Ordered Dose: 50 mg         Last MAR action:  Given Joane Postel A                Orders Placed During this Encounter     Orders Placed This Encounter   Procedures   . US Venous Dopp Right Low Extrem       Diagnostic Study Results     Radiologic Studies  Radiology Results (24 Hour)     Procedure Component Value Units Date/Time    US Venous Dopp Right Low Extrem [id number] Collected:08/02/12 1745    Order Status:Completed  Updated:08/02/12 1751    Narrative:    Clinical History: Right lower extremity pain. Rule out DVT.     Findings: Real-time sonographic  imaging of the deep venous system of the  right lower extremity was performed using color-flow, Doppler waveform,  manual graded compression, and flow augmentation techniques. Common  femoral, femoral, popliteal, and calf veins were evaluated. All  visualized deep venous structures demonstrate normal flow and  compressibility in the absence of intraluminal thrombus.       Impression:    Impression: No evidence of right lower extremity DVT.      Marland Kitchen    Clinical Course / MDM       Notes: Pt's pain improved with Ultram, VSS, reviewed results of doppler and follow up with pt.  Foot and ankle pain and increased swelling likely related to arthritis.              Clinical Impression &  Disposition     Clinical Impression:  1. Swelling of right lower extremity    2. Osteoarthritis of ankle and foot        Disposition  ED Disposition     Discharge Mary [name] discharge to home/self care.    Condition at discharge: Stable            Prescriptions  New Prescriptions    TRAMADOL (ULTRAM) 50 MG TABLET    Take 1 tablet (50 mg total) by mouth every 6 (six) hours as needed for Pain.               Fenton Malling, MD  08/02/12 [phone]

## 2012-08-02 NOTE — Discharge Instructions (Signed)
LE Edema Etiology Unknown    You have been seen today for swelling of your leg(s).    The cause of your swelling is still not known, despite the evaluation done by the physician.    There are many possible causes for leg swelling. The following is a description of some of theses causes:   Deep Venous Thrombus (DVT): This is a blood clot in one of the deep veins of the leg. Symptoms can include leg pain and swelling. The diagnosis is usually made with an ultrasound of the leg which can detect a blockage in the veins.   Congestive heart failure is a condition where some fluid has built-up in your lungs because of a problem with your heart. The main symptom is shortness of breath which is often worse when you lie down. You may also notice leg swelling and weight gain.   Cellulitis: This is a bacterial infection of the skin. Symptoms usually include redness, swelling, and warmth in the affected area. Some people will have a fever with this infection.   Venous Stasis: This develops when the blood pools in the legs for an extended time. The legs become swollen and sometimes red. The swelling gets better at night because lying flat makes it easier for blood to return to the heart. During the day when a person stands or sits with their legs dangling down, the blood once again has trouble getting out of the legs and the swelling worsens.   Low amounts of Albumin: Albumin is a type of protein that is carried in the blood stream. One of the things that it does is to keep the fluid part of the blood from leaking out of the blood vessels and into the surrounding tissues. Low albumin can happen from poor nutrition, alcoholism, liver disease and other chronic illnesses.    You had a duplex-Doppler ultrasound of your leg to look for a blood clot. No blood clot was seen. Although this test is not perfect, it does a very good job in detecting blood clots. In some cases, the doctor may want the ultrasound repeated in about a  week to recheck the results.    Despite any testing that may have been done here today, the exact cause of your swelling is still not known. Even though the cause is unknown, your physician feels that it is safe for you to be discharged to home. You will need to be seen by your doctor or a referral physician in order to have further testing done.    YOU SHOULD SEEK MEDICAL ATTENTION IMMEDIATELY, EITHER HERE OR AT THE NEAREST EMERGENCY DEPARTMENT, IF ANY OF THE FOLLOWING OCCURS:   You develop worsening swelling of your leg(s).   You develop redness of the skin of your legs with pain and / or fever.   You develop any shortness of breath or chest pain or tightness or pressure over your heart.   You develop any shortness or breath, especially if you also have pain in your chest when you take a breath.      LE Edema Etiology Unknown    You have been seen today for swelling of your leg(s).    The cause of your swelling is still not known, despite the evaluation done by the physician.    There are many possible causes for leg swelling. The following is a description of some of theses causes:   Deep Venous Thrombus (DVT): This is a blood clot in one of the  deep veins of the leg. Symptoms can include leg pain and swelling. The diagnosis is usually made with an ultrasound of the leg which can detect a blockage in the veins.   Congestive heart failure is a condition where some fluid has built-up in your lungs because of a problem with your heart. The main symptom is shortness of breath which is often worse when you lie down. You may also notice leg swelling and weight gain.   Cellulitis: This is a bacterial infection of the skin. Symptoms usually include redness, swelling, and warmth in the affected area. Some people will have a fever with this infection.   Venous Stasis: This develops when the blood pools in the legs for an extended time. The legs become swollen and sometimes red. The swelling gets better at  night because lying flat makes it easier for blood to return to the heart. During the day when a person stands or sits with their legs dangling down, the blood once again has trouble getting out of the legs and the swelling worsens.   Low amounts of Albumin: Albumin is a type of protein that is carried in the blood stream. One of the things that it does is to keep the fluid part of the blood from leaking out of the blood vessels and into the surrounding tissues. Low albumin can happen from poor nutrition, alcoholism, liver disease and other chronic illnesses.    You had a duplex-Doppler ultrasound of your leg to look for a blood clot. No blood clot was seen. Although this test is not perfect, it does a very good job in detecting blood clots. In some cases, the doctor may want the ultrasound repeated in about a week to recheck the results.    Despite any testing that may have been done here today, the exact cause of your swelling is still not known. Even though the cause is unknown, your physician feels that it is safe for you to be discharged to home. You will need to be seen by your doctor or a referral physician in order to have further testing done.    YOU SHOULD SEEK MEDICAL ATTENTION IMMEDIATELY, EITHER HERE OR AT THE NEAREST EMERGENCY DEPARTMENT, IF ANY OF THE FOLLOWING OCCURS:   You develop worsening swelling of your leg(s).   You develop redness of the skin of your legs with pain and / or fever.   You develop any shortness of breath or chest pain or tightness or pressure over your heart.   You develop any shortness or breath, especially if you also have pain in your chest when you take a breath.

## 2012-08-02 NOTE — ED Notes (Signed)
Pt states that she has a history of bone spurs and was c/o foot and ankle pain that was treated by a prednisone shot last Monday.  Pt now c/o heel pain that radiates up her leg.  Unsure if it is the front or back of her leg but states it hurts.  Pt states she called her doctor and they told her to come to ER to make sure there is no blood clot.  Right ankle and lower leg  slightly swollen.

## 2013-06-20 ENCOUNTER — Emergency Department: Payer: Medicare Other

## 2013-06-20 ENCOUNTER — Inpatient Hospital Stay: Payer: Medicare Other | Admitting: Family Medicine

## 2013-06-20 ENCOUNTER — Inpatient Hospital Stay
Admission: EM | Admit: 2013-06-20 | Discharge: 2013-06-23 | DRG: 602 | Disposition: A | Discharge status: Home / Self Care | Payer: Medicare Other | Attending: Family Medicine | Admitting: Family Medicine

## 2013-06-20 DIAGNOSIS — L02419 Cutaneous abscess of limb, unspecified: Principal | ICD-10-CM | POA: Diagnosis present

## 2013-06-20 DIAGNOSIS — Z880 Allergy status to penicillin: Secondary | ICD-10-CM

## 2013-06-20 DIAGNOSIS — Z992 Dependence on renal dialysis: Secondary | ICD-10-CM

## 2013-06-20 DIAGNOSIS — I825Y9 Chronic embolism and thrombosis of unspecified deep veins of unspecified proximal lower extremity: Secondary | ICD-10-CM | POA: Diagnosis present

## 2013-06-20 DIAGNOSIS — K219 Gastro-esophageal reflux disease without esophagitis: Secondary | ICD-10-CM | POA: Diagnosis present

## 2013-06-20 DIAGNOSIS — H268 Other specified cataract: Secondary | ICD-10-CM | POA: Diagnosis present

## 2013-06-20 DIAGNOSIS — Z794 Long term (current) use of insulin: Secondary | ICD-10-CM

## 2013-06-20 DIAGNOSIS — Z7982 Long term (current) use of aspirin: Secondary | ICD-10-CM

## 2013-06-20 DIAGNOSIS — I120 Hypertensive chronic kidney disease with stage 5 chronic kidney disease or end stage renal disease: Secondary | ICD-10-CM | POA: Diagnosis present

## 2013-06-20 DIAGNOSIS — E119 Type 2 diabetes mellitus without complications: Secondary | ICD-10-CM | POA: Diagnosis present

## 2013-06-20 DIAGNOSIS — N186 End stage renal disease: Secondary | ICD-10-CM | POA: Diagnosis present

## 2013-06-20 DIAGNOSIS — N039 Chronic nephritic syndrome with unspecified morphologic changes: Secondary | ICD-10-CM | POA: Diagnosis present

## 2013-06-20 LAB — BASIC METABOLIC PANEL
Anion Gap: 17 — ABNORMAL HIGH (ref 5.0–15.0)
BUN: 69 mg/dL — ABNORMAL HIGH (ref 7–19)
CO2: 23 (ref 22–29)
Calcium: 9.5 mg/dL (ref 8.5–10.5)
Chloride: 102 (ref 98–107)
Creatinine: 5.8 mg/dL — ABNORMAL HIGH (ref 0.6–1.0)
Glucose: 188 mg/dL — ABNORMAL HIGH (ref 70–100)
Potassium: 4.4 (ref 3.5–5.1)
Sodium: 142 (ref 136–145)

## 2013-06-20 LAB — CBC AND DIFFERENTIAL
Basophils Absolute Automated: 0.03 (ref 0.00–0.20)
Basophils Automated: 0 %
Eosinophils Absolute Automated: 0.21 (ref 0.00–0.70)
Eosinophils Automated: 2 %
Hematocrit: 31.8 % — ABNORMAL LOW (ref 37.0–47.0)
Hgb: 10.8 g/dL — ABNORMAL LOW (ref 12.0–16.0)
Immature Granulocytes Absolute: 0.06 — ABNORMAL HIGH
Immature Granulocytes: 1 %
Lymphocytes Absolute Automated: 2.43 (ref 0.50–4.40)
Lymphocytes Automated: 25 %
MCH: 31.3 pg (ref 28.0–32.0)
MCHC: 34 g/dL (ref 32.0–36.0)
MCV: 92.2 fL (ref 80.0–100.0)
MPV: 11.9 fL (ref 9.4–12.3)
Monocytes Absolute Automated: 0.64 (ref 0.00–1.20)
Monocytes: 7 %
Neutrophils Absolute: 6.31 (ref 1.80–8.10)
Neutrophils: 66 %
Nucleated RBC: 0 (ref 0–1)
Platelets: 177 (ref 140–400)
RBC: 3.45 — ABNORMAL LOW (ref 4.20–5.40)
RDW: 14 % (ref 12–15)
WBC: 9.62 (ref 3.50–10.80)

## 2013-06-20 LAB — GFR: EGFR: 8.7

## 2013-06-20 MED ORDER — SODIUM CHLORIDE 0.9 % IV MBP
1.0000 g | Freq: Once | INTRAVENOUS | Status: AC
Start: 2013-06-20 — End: 2013-06-20
  Administered 2013-06-20: 1 g via INTRAVENOUS
  Filled 2013-06-20: qty 1000

## 2013-06-20 MED ORDER — ACETAMINOPHEN 325 MG PO TABS
650.00 mg | ORAL_TABLET | Freq: Four times a day (QID) | ORAL | Status: AC | PRN
Start: 2013-06-20 — End: 2013-06-21

## 2013-06-20 MED ORDER — HYDROCODONE-ACETAMINOPHEN 5-325 MG PO TABS
1.0000 | ORAL_TABLET | Freq: Once | ORAL | Status: AC
Start: 2013-06-20 — End: 2013-06-20
  Administered 2013-06-20: 1 via ORAL
  Filled 2013-06-20: qty 1

## 2013-06-20 NOTE — Plan of Care (Signed)
Pt admit from ED at 2115 via stretcher.  Pt oriented to room and call light.  Dr. [name] called and updated to pt admit.  Safety measures implemented.  Bed alarm is on.

## 2013-06-20 NOTE — ED Provider Notes (Addendum)
Physician/Midlevel provider first contact with patient: 06/20/13 1724         History     Chief Complaint   Patient presents with   . Leg Pain     Since Friday. Gradual onset lle redness edema of foot/ankle.  No fever  No upper leg pain/swelling  Gradual onset  Dialysis t/t/s  DM history    No injury  History per pt  Gradual onset  No modifiers    Pcp/renal [place]      Past Medical History   Diagnosis Date   . Diabetes mellitus without complication    . Hyperlipidemia    . Hypertensive disorder    . Chronic kidney disease      dialysis t-tr-sat   . Arthritis    . Cataracts, bilateral    . GERD (gastroesophageal reflux disease)        Past Surgical History   Procedure Date   . Av fistula placement 2012       History reviewed. No pertinent family history.    Social  History   Substance Use Topics   . Smoking status: Never Smoker    . Smokeless tobacco: Not on file   . Alcohol Use: No       .     Allergies   Allergen Reactions   . Penicillins Hives   . Shrimp (Shellfish Allergy) Swelling       Current/Home Medications    ACETAMINOPHEN (TYLENOL) 500 MG TABLET    Take 500 mg by mouth every 6 (six) hours as needed.      AMLODIPINE (NORVASC) 10 MG TABLET    Take 10 mg by mouth daily.      ASPIRIN 81 MG TABLET    Take 81 mg by mouth daily.      ATORVASTATIN (LIPITOR) 10 MG TABLET    Take 10 mg by mouth daily.      B COMPLEX-VITAMIN C-FOLIC ACID (NEPHRO-VITE) 0.8 MG TABS    Take 0.8 mg by mouth.    BISACODYL (DULCOLAX) 5 MG EC TABLET    Take 5 mg by mouth daily as needed.      ESOMEPRAZOLE (NEXIUM) 40 MG CAPSULE    Take 40 mg by mouth every morning before breakfast.      INSULIN ASPART (NOVOLOG) 100 UNIT/ML INJECTION    Inject 1-10 Units into the skin 3 (three) times daily before meals.      INSULIN GLARGINE (LANTUS) 100 UNIT/ML INJECTION    Inject 10-20 Units into the skin nightly.      PREGABALIN (LYRICA) 75 MG CAPSULE    Take 75 mg by mouth 2 (two) times daily.      TRAMADOL (ULTRAM) 50 MG TABLET    Take 1 tablet (50  mg total) by mouth every 6 (six) hours as needed for Pain.        Review of Systems   Constitutional: Negative for fever.   HENT: Negative for congestion.    Respiratory: Negative for shortness of breath.    Cardiovascular: Positive for leg swelling. Negative for chest pain.   Gastrointestinal: Negative for abdominal pain.   Genitourinary: Negative for dysuria.   Musculoskeletal: Positive for arthralgias (l shoulder pain awoke with this pain worse with palpation).   Skin: Negative for rash.   Neurological: Negative for headaches.   Psychiatric/Behavioral: Negative for dysphoric mood.   All other systems reviewed and are negative.        Physical Exam    BP  118/59  Pulse 69  Temp 98.3 F (36.8 C)  Resp 16  Ht 1.6 m  Wt 79.379 kg  BMI 31.01 kg/m2  SpO2 97%    Physical Exam   Constitutional:        Well Appearing   HENT:   Head: Normocephalic.   Eyes: Conjunctivae normal are normal.   Neck:        Normal Inspection   Cardiovascular: Normal rate, regular rhythm, normal heart sounds and intact distal pulses.    Pulmonary/Chest: Breath sounds normal.   Abdominal: Soft. Bowel sounds are normal. There is no tenderness.   Musculoskeletal: She exhibits edema.        Redness confluent lle asso warmth  No fluctuance  No upper leg edema     Neurological: She is alert. She has normal strength.   Skin: Skin is warm and dry.   Psychiatric: She has a normal mood and affect.       MDM and ED Course     ED Medication Orders      Start     Status Ordering Provider    06/20/13 2040   HYDROcodone-acetaminophen (NORCO) 5-325 MG per tablet 1 tablet   Once      Route: Oral  Ordered Dose: 1 tablet         Ordered Alisa Graff    06/20/13 1855   cefTRIAXone (ROCEPHIN) 1 g in sodium chloride 0.9 % 50 mL IVPB mini-bag plus   Once      Route: Intravenous  Ordered Dose: 1 g         Last MAR action:  Stopped K[facility]ens [facility] J                 MDM  cx ikhinwin will see tomorrow      Procedures    Clinical Impression & Disposition      Clinical Impression  Final diagnoses:   Cellulitis of left lower extremity   Diabetes        ED Disposition     Admit Bed Type: General [8]  Admitting Physician: Toy Baker [id number]  Patient Class: Observation [104]             New Prescriptions    No medications on file               Alisa Graff, MD  06/20/13 1737    Alisa Graff, MD  06/20/13 2040

## 2013-06-20 NOTE — ED Notes (Signed)
Pt reports left leg pain since Friday. Pt with + swelling and bruising to LLE/ankle.  No injury noted. Pt alert.

## 2013-06-21 DIAGNOSIS — L03116 Cellulitis of left lower limb: Secondary | ICD-10-CM

## 2013-06-21 HISTORY — DX: Cellulitis of left lower limb: L03.116

## 2013-06-21 LAB — CBC
Hematocrit: 28.8 % — ABNORMAL LOW (ref 37.0–47.0)
Hgb: 9.5 g/dL — ABNORMAL LOW (ref 12.0–16.0)
MCH: 30.5 pg (ref 28.0–32.0)
MCHC: 33 g/dL (ref 32.0–36.0)
MCV: 92.6 fL (ref 80.0–100.0)
MPV: 11.8 fL (ref 9.4–12.3)
Nucleated RBC: 0 (ref 0–1)
Platelets: 193 (ref 140–400)
RBC: 3.11 — ABNORMAL LOW (ref 4.20–5.40)
RDW: 14 % (ref 12–15)
WBC: 7.69 (ref 3.50–10.80)

## 2013-06-21 LAB — POCT GLUCOSE
Whole Blood Glucose POCT: 279 mg/dL — AB (ref 70–100)
Whole Blood Glucose POCT: 323 mg/dL — AB (ref 70–100)
Whole Blood Glucose POCT: 183 mg/dL — AB (ref 70–100)
Whole Blood Glucose POCT: 140 mg/dL — AB (ref 70–100)
Whole Blood Glucose POCT: 140 mg/dL — AB (ref 70–100)

## 2013-06-21 LAB — RENAL FUNCTION PANEL
Albumin: 2.7 g/dL — ABNORMAL LOW (ref 3.5–5.0)
Anion Gap: 15 (ref 5.0–15.0)
BUN: 74 mg/dL — ABNORMAL HIGH (ref 7–19)
CO2: 23 (ref 22–29)
Calcium: 9.1 mg/dL (ref 8.5–10.5)
Chloride: 104 (ref 98–107)
Creatinine: 5.2 mg/dL — ABNORMAL HIGH (ref 0.6–1.0)
Glucose: 229 mg/dL — ABNORMAL HIGH (ref 70–100)
Phosphorus: 5.7 mg/dL — ABNORMAL HIGH (ref 2.3–4.7)
Potassium: 4.1 (ref 3.5–5.1)
Sodium: 142 (ref 136–145)

## 2013-06-21 LAB — GFR: EGFR: 9.9

## 2013-06-21 MED ORDER — INSULIN ASPART 100 UNIT/ML SC SOLN
1.00 [IU] | SUBCUTANEOUS | Status: DC | PRN
Start: 2013-06-21 — End: 2013-06-23
  Administered 2013-06-21: 2 [IU] via SUBCUTANEOUS
  Administered 2013-06-22: 3 [IU] via SUBCUTANEOUS

## 2013-06-21 MED ORDER — AMLODIPINE BESYLATE 5 MG PO TABS
10.0000 mg | ORAL_TABLET | Freq: Every day | ORAL | Status: DC
Start: 2013-06-21 — End: 2013-06-23
  Administered 2013-06-22: 10 mg via ORAL
  Filled 2013-06-21: qty 2

## 2013-06-21 MED ORDER — SODIUM CHLORIDE 0.9 % IV BOLUS
250.0000 mL | INTRAVENOUS | Status: AC | PRN
Start: 2013-06-21 — End: 2013-06-21

## 2013-06-21 MED ORDER — HEPARIN SODIUM (PORCINE) 1000 UNIT/ML IJ SOLN
2000.0000 [IU] | INTRAMUSCULAR | Status: AC | PRN
Start: 2013-06-21 — End: 2013-06-21
  Administered 2013-06-21: 2000 [IU] via INTRAVENOUS
  Filled 2013-06-21: qty 2

## 2013-06-21 MED ORDER — DEXTROSE 50 % IV SOLN
25.0000 mL | INTRAVENOUS | Status: DC | PRN
Start: 2013-06-21 — End: 2013-06-23

## 2013-06-21 MED ORDER — NEPHRO-VITE 0.8 MG PO TABS
1.0000 | ORAL_TABLET | Freq: Every day | ORAL | Status: DC
Start: 2013-06-21 — End: 2013-06-23
  Administered 2013-06-21 – 2013-06-23 (×3): 1 via ORAL
  Filled 2013-06-21 (×3): qty 1

## 2013-06-21 MED ORDER — SODIUM CHLORIDE 0.9 % IV MBP
100.0000 mg | Freq: Two times a day (BID) | INTRAVENOUS | Status: DC
Start: 2013-06-21 — End: 2013-06-21
  Administered 2013-06-21: 100 mg via INTRAVENOUS
  Filled 2013-06-21 (×2): qty 100

## 2013-06-21 MED ORDER — VANCOMYCIN HCL 1000 MG IV SOLR
1000.0000 mg | Freq: Once | INTRAVENOUS | Status: AC
Start: 2013-06-21 — End: 2013-06-21
  Administered 2013-06-21: 1000 mg via INTRAVENOUS
  Filled 2013-06-21: qty 1000

## 2013-06-21 MED ORDER — PANTOPRAZOLE SODIUM 40 MG PO TBEC
40.0000 mg | DELAYED_RELEASE_TABLET | Freq: Every morning | ORAL | Status: DC
Start: 2013-06-21 — End: 2013-06-23
  Administered 2013-06-21 – 2013-06-23 (×3): 40 mg via ORAL
  Filled 2013-06-21 (×3): qty 1

## 2013-06-21 MED ORDER — LIDOCAINE-TRANSPARENT DRESSING 4 % EX KIT
PACK | CUTANEOUS | Status: DC | PRN
Start: 2013-06-21 — End: 2013-06-23
  Administered 2013-06-21 – 2013-06-23 (×2): 1 via TOPICAL
  Filled 2013-06-21 (×2): qty 1

## 2013-06-21 MED ORDER — GLUCOSE 40 % PO GEL
15.0000 g | ORAL | Status: DC | PRN
Start: 2013-06-21 — End: 2013-06-23

## 2013-06-21 MED ORDER — ACETAMINOPHEN 325 MG PO TABS
650.0000 mg | ORAL_TABLET | ORAL | Status: DC | PRN
Start: 2013-06-21 — End: 2013-06-23

## 2013-06-21 MED ORDER — SODIUM CHLORIDE 0.9 % IV SOLN
INTRAVENOUS | Status: AC
Start: 2013-06-21 — End: 2013-06-21

## 2013-06-21 MED ORDER — BISACODYL 5 MG PO TBEC
5.0000 mg | DELAYED_RELEASE_TABLET | Freq: Every day | ORAL | Status: DC | PRN
Start: 2013-06-21 — End: 2013-06-23
  Administered 2013-06-21 – 2013-06-22 (×2): 5 mg via ORAL
  Filled 2013-06-21 (×2): qty 1

## 2013-06-21 MED ORDER — ASPIRIN 81 MG PO CHEW
81.00 mg | CHEWABLE_TABLET | Freq: Every day | ORAL | Status: DC
Start: 2013-06-21 — End: 2013-06-23
  Administered 2013-06-21 – 2013-06-23 (×3): 81 mg via ORAL
  Filled 2013-06-21 (×3): qty 1

## 2013-06-21 MED ORDER — SODIUM CHLORIDE 0.9 % IV BOLUS
100.0000 mL | INTRAVENOUS | Status: AC | PRN
Start: 2013-06-21 — End: 2013-06-21

## 2013-06-21 MED ORDER — ATORVASTATIN CALCIUM 20 MG PO TABS
10.0000 mg | ORAL_TABLET | Freq: Every evening | ORAL | Status: DC
Start: 2013-06-21 — End: 2013-06-23
  Administered 2013-06-21 – 2013-06-22 (×2): 10 mg via ORAL
  Filled 2013-06-21 (×2): qty 1

## 2013-06-21 MED ORDER — INSULIN ASPART 100 UNIT/ML SC SOLN
1.0000 [IU] | Freq: Three times a day (TID) | SUBCUTANEOUS | Status: DC | PRN
Start: 2013-06-21 — End: 2013-06-23
  Administered 2013-06-21: 1 [IU] via SUBCUTANEOUS
  Filled 2013-06-21: qty 30

## 2013-06-21 MED ORDER — ONDANSETRON HCL 4 MG/2ML IJ SOLN
4.00 mg | Freq: Three times a day (TID) | INTRAMUSCULAR | Status: DC | PRN
Start: 2013-06-21 — End: 2013-06-23

## 2013-06-21 MED ORDER — INSULIN GLARGINE 100 UNIT/ML SC SOLN
12.0000 [IU] | Freq: Every evening | SUBCUTANEOUS | Status: DC
Start: 2013-06-21 — End: 2013-06-23
  Administered 2013-06-21 – 2013-06-22 (×3): 12 [IU] via SUBCUTANEOUS
  Filled 2013-06-21: qty 120

## 2013-06-21 MED ORDER — INSULIN ASPART 100 UNIT/ML SC SOLN
1.0000 [IU] | Freq: Every evening | SUBCUTANEOUS | Status: DC | PRN
Start: 2013-06-21 — End: 2013-06-23
  Administered 2013-06-21: 1 [IU] via SUBCUTANEOUS
  Administered 2013-06-21: 2 [IU] via SUBCUTANEOUS
  Administered 2013-06-22: 1 [IU] via SUBCUTANEOUS
  Filled 2013-06-21: qty 20

## 2013-06-21 MED ORDER — ENOXAPARIN SODIUM 30 MG/0.3ML SC SOLN
30.0000 mg | Freq: Every morning | SUBCUTANEOUS | Status: DC
Start: 2013-06-21 — End: 2013-06-23
  Administered 2013-06-21 – 2013-06-23 (×3): 30 mg via SUBCUTANEOUS
  Filled 2013-06-21 (×3): qty 0.3

## 2013-06-21 MED ORDER — OXYCODONE-ACETAMINOPHEN 5-325 MG PO TABS
1.0000 | ORAL_TABLET | ORAL | Status: DC | PRN
Start: 2013-06-21 — End: 2013-06-23
  Administered 2013-06-21 – 2013-06-23 (×3): 1 via ORAL
  Filled 2013-06-21 (×3): qty 1

## 2013-06-21 MED ORDER — PREGABALIN 75 MG PO CAPS
75.0000 mg | ORAL_CAPSULE | Freq: Two times a day (BID) | ORAL | Status: DC
Start: 2013-06-21 — End: 2013-06-23
  Administered 2013-06-21 – 2013-06-23 (×5): 75 mg via ORAL
  Filled 2013-06-21 (×5): qty 1

## 2013-06-21 MED ORDER — GLUCAGON HCL (RDNA) 1 MG IJ SOLR
1.0000 mg | INTRAMUSCULAR | Status: DC | PRN
Start: 2013-06-21 — End: 2013-06-23

## 2013-06-21 NOTE — Progress Notes (Signed)
[facility]  HOSPITALIST  PROGRESS NOTE      Patient: Mary [name]  Date: 06/21/2013   LOS: 1 Days  Admission Date: 06/20/2013   MRN: [id number]  Attending: Evans Lance MD     ASSESSMENT/PLAN     Mary [name] is a 67 y.o. female admitted with LEFT LE cellulitis for the past 3 days.    LEFT LE Cellulitis  On doxycycline with improved. S/P single dose ceftriaxone;  Followed by ID (Dr. Fayne Mediate) who adding vanco (renally dosed) with improved. Pedal pulses >+1 B/L, arterial insufficiency unlikely    ESRD on HD (TTS)  Stable. Managed by  Leonette Most    IDDM  Not well controlled. Continue sliding scale and home Lantus    Anemia of Chronic Kidney Disease  Hgb at goal. Continue to monitor    HTN  Well controlled    HLD  Stable, continue lipitor    GERD  Continue PPI    Neuropathy  Stable. Continue lyrica    Nutrition: Renal Diet  GI Prophylaxis: continue PPI  DVT Prophylaxis: low molecular weight heparin    Code Status: full code    DISPO: Pending    Care Plan discussed with nursing, consultants, case manager.    Family Contact: none    SUBJECTIVE     Leg swelling/pain improved, redness down. No fever/chills, no other pain. No other complaints. No HA/dizziness    MEDICATIONS     Current Facility-Administered Medications   Medication Dose Route Frequency   . amLODIPine  10 mg Oral Daily   . aspirin  81 mg Oral Daily   . atorvastatin  10 mg Oral QPM   . b complex-vitamin c-folic acid  1 tablet Oral Daily   . [COMPLETED] cefTRIAXone  1 g Intravenous Once   . doxycycline  100 mg Intravenous Q12H   . enoxaparin  30 mg Subcutaneous QAM   . [COMPLETED] HYDROcodone-acetaminophen  1 tablet Oral Once   . insulin glargine  12 Units Subcutaneous QHS   . pantoprazole  40 mg Oral QAM AC   . pregabalin  75 mg Oral Q12H [place]       ROS     Remainder of 10 point ROS as above or otherwise negative    PHYSICAL EXAM     Filed Vitals:    06/21/13 0549   BP: 116/55   Pulse: 68   Temp: 97.3 F (36.3 C)   Resp: 20   SpO2: 95%       Temperature: Temp  Min:  97 F (36.1 C)  Max: 98.3 F (36.8 C)  Pulse: Pulse  Min: 68   Max: 77   Respiratory: Resp  Min: 16   Max: 20   Non-Invasive BP: BP  Min: 112/67  Max: 150/63  Pulse Oximetry SpO2  Min: 95 %  Max: 100 %    Intake and Output Summary (Last 24 hours) at Date Time    Intake/Output Summary (Last 24 hours) at 06/21/13 0848  Last data filed at 06/21/13 0547   Gross per 24 hour   Intake    211 ml   Output      0 ml   Net    211 ml       GEN APPEARANCE: Normal;  A&OX3  HEENT: PERLA; EOMI; Conjunctiva Clear  NECK: Supple; No bruits  CVS: RRR, S1, S2; No M/G/R  LUNGS: CTAB; No Wheezes; No Rhonchi: No rales  ABD: Soft; No TTP; + Normoactive BS  EXT:  LEFT UE fistula, No edema; Pulses 2+ and intact  SKIN: mild LEFT LE rash with swelling, mild   NEURO: CN 2-12 intact; No Focal neurological deficits      LABS       Lab 06/21/13 0520 06/20/13 1746   WBC 7.69 9.62   RBC 3.11* 3.45*   HGB 9.5* 10.8*   HCT 28.8* 31.8*   MCV 92.6 92.2   PLT 193 177         Lab 06/20/13 1746   NA 142   K 4.4   CL 102   CO2 23   BUN 69*   CREAT 5.8*   GLU 188*   CA 9.5   MG --         Lab 06/21/13 0922   ALT --   AST --   GGT --   BILITOTAL --   BILIDIRECT --   ALB 2.7*   ALKPHOS --       No results found for this basename: CK:3,CKMB:3,CKMBINDEX:3,TROPI:3 in the last 168 hours    No results found for this basename: INR:3,PT:3,PTT:3 in the last 168 hours      RADIOLOGY     Radiological Procedure reviewed.    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 No evidence of     acute DVT.        Susy Manor, MD     06/20/2013 7:19 PM       Signed,  Evans Lance, MD  8:48 AM 06/21/2013

## 2013-06-21 NOTE — Progress Notes (Signed)
Dialysis Treatment Note  Pt's lines clotted at 3.5 hour mark of dialysis, able to return all but approx 50 ml blood. New set up and treatment resumed. Lowered goal per bp decrease.     Patient:  Mary [name] MRN#:  [id number]  Unit/Room/Bed:  [id number]   Isolation: None       Allergies:   Allergies   Allergen Reactions   . Penicillins Hives   . Shrimp (Shellfish Allergy) Swelling     Code Status: Full Code    Problem List:   Patient Active Problem List    Diagnosis Date Noted   . Cellulitis of left leg 06/21/2013   . ESRD (end stage renal disease) 06/21/2013   . DM (diabetes mellitus) 06/21/2013   . Hypertension 06/21/2013   . Cellulitis 06/21/2013       Dialysis Order:  Active Orders   Dialysis    Hemodialysis inpatient     Frequency: Once     Start Date/Time: 06/21/13 [id number]     Number of Occurrences:  1 Occurrences     Order Questions:     . Date of Dialysis 06/21/2013     . K+ (Initial) 3 mEq     . KPP (Per Protocol) Yes     . Ca++ 2.5 mEq     . Bicarb 35 mEq     . Na+ 138 mEq     . Dialyzer F160     . Dialysate Temperature (C) 37     . BFR-As tolerated to a maximum of: 350 mL/min     . DFR 700 mL/min     . Duration of Treatment 4 Hours     . Fluid Removal (L) and Dry Weight (Kg) 2L (Comment: 2 - 2.5 kg as tolerated )     . Access Type-Location AVF-L        Dialysis Treatment Type:    Time out/Safety Check: Time Out/Safety Check Completed: Yes (06/21/13 0912)  [facility] Consent: Consent for HD signed for this hospitalization: Yes (06/21/13 0912)  Blood Consent (if applicable): Blood Consent Verified: Not Applicable ([id number])    Dialysis Access:      Graft/Fistula  Left (Active)   Fistula/ Graft Assessment Abnormalities WDL 06/21/2013  1:51 PM   Needle Size 15 Gauge 06/21/2013  1:51 PM   Cannulation Sites held Arterial (min) 5 06/21/2013  1:51 PM   Cannulation Sites held Venous (min) 5 06/21/2013  1:51 PM   Hemostasis Achieved Yes 06/21/2013  1:51 PM       General Assessments:     06/21/13 0912   Bedside  Nurse Communication   Name of bedside RN - pre dialysis [facility]   Treatment Initiation- With Dialysis Precautions   Time Out/Safety Check Completed Yes   Consent for HD signed for this hospitalization Y   Blood Consent Verified N/A   Dialysis Precautions All Connections Secured;Saline Line Double Clamped;Venous Parameters Set;Arterial Parameters Set;Air Foam Detecctor Engaged   Dialysis Treatment Type Routine;Bedside   Is patient diabetic? No   RO/Hemodialysis Architectural technologist   Is Total Chlorine less than 0.1 ppm? Yes   Orignial Total Chlorine Testing Time 0800   RO/Hemodialysis Archivist Number 3    RO # 3    Water Hardness 0    pH 7.6    Pressure Test Verified Yes   Alarms Verified Passed   Machine Temperature 97.7 F (36.5 C)   Alarms Verified Yes   Hemodialysis  Conductivity (Machine) 13.9    Hemodialysis Conductivity (Meter) 13.8    RO Machine Log Completed Yes   Hepatitis Status   HBsAg (Antigen) Result Negative   HBsAg Date Drawn 08/31/12   HBsAb (Antibody) Result Reactive  (>150)   Dialysis Weight   Pre-Treatment Weight (Kg) 88.9    Scale Type Floor Scale   Vitals   Temp ! 96.9 F (36.1 C)   Heart Rate 65    BP 133/59 mmHg   O2 Device None (Room air)   Assessment   Mental Status Alert;Oriented;Cooperative   Respiratory  WDL   Edema  X   LLE Edema +3   Skin Condition/Temp Warm   Mobility Ambulatory with Assistance   Graft/Fistula  Left   Placement Date: (c)   Access Type: Arteriovenous Fistula  Orientation: Left  Graft/Fistula Location: Upper Arm   Fistula/ Graft Assessment Abnormalities WDL   Needle Size 15 Gauge   Pain Assessment   Charting Type Assessment   Pain Assessment Numeric Scale (0-10)   Pain Score 0   Hemodialysis Comments   Pre-Hemodialysis Comments Consent signed, pre weighed, pt finished breakfast before dialysis    Hemodialysis History Information   Outpatient Dialysis Unit [facility]    Outpatient Dialysis Schedule TTS   Outpatient Nephrologist [facility]       06/21/13 1351   Treatment Summary   Time Off Machine 1330   Duration of Treatment (Hours) 4   Dialyzer Clearance Clotted   Fluid Volume Off (mL) 2000    Prime Volume (mL) 200    Rinseback Volume (mL) 400    Fluid Given: Normal Saline (mL) 200    Fluid Given: PRBC  0 mL   Fluid Given: Albumin (mL) 0    Fluid Given: Other (mL) 0    Total Fluid Given 800    Hemodialysis Net Fluid Removed 1200    Post Treatment Assessment   Patient Response to Treatment pt's blood pressure started dropping during first hour of dialysis, lowered goal   Information for Next Treatment 2000 units of heparin used, lines clotted at 3.5 hours, will need more heparin    Graft/Fistula  Left   Placement Date: (c)   Access Type: Arteriovenous Fistula  Orientation: Left  Graft/Fistula Location: Upper Arm   Fistula/ Graft Assessment Abnormalities WDL   Needle Size 15 Gauge   Cannulation Sites held Arterial (min) 5   Cannulation Sites held Venous (min) 5   Hemostasis Achieved Yes   Vitals   Temp 97.2 F (36.2 C)   Heart Rate 61    BP 114/57 mmHg   Assessment   Mental Status Alert;Oriented;Cooperative   Cardiac Regularity Regular   Cardiac Rhythm Normal Sinus Rhythm   Edema  X   LLE Edema +2   General Skin Color Appropriate for ethnicity  (left lower ext redness, swollen )   Skin Condition/Temp Warm   Mobility Ambulatory with Assistance   Education   Person taught Patient   Knowledge basis Substantial   Topics taught Procedure   Bedside Nurse Communication   Name of bedside RN - pre dialysis [facility]    Name of bedside RN - post dialysis [facility]           Pain Assessment: Pain Assessment  Charting Type: Assessment (06/21/13 0912)  Pain Assessment: Numeric Scale (0-10) (06/21/13 0912)  Pain Score: 0-No pain (06/21/13 0912)  POSS Score: Awake and Alert (06/20/13 2154)  Pain Location: Leg (06/20/13 2154)  Pain Orientation: Left (06/20/13 2154)  Pain Descriptors: Heaviness (06/20/13 2154)  Pain Frequency: Continuous (06/20/13 2154)  Effect of Pain on  Daily Activities: moderate (06/20/13 2154)  Patient's Stated Comfort Functional Goal: 3-mild pain (06/20/13 2154)  Pain Intervention(s): Medication (See eMAR) (06/20/13 2154)    Labs Values:    HBsAg Result:HBsAg (Antigen) Result: Negative (06/21/13 0912)  HBsAg Date Drawn: HBsAg Date Drawn: 08/31/12 (06/21/13 0912)  HBsAg Next Due Date:     Lab Results   Component Value Date    BUN 74* 06/21/2013    NA 142 06/21/2013    K 4.1 06/21/2013    CREAT 5.2* 06/21/2013    CO2 23 06/21/2013    CA 9.1 06/21/2013    PHOS 5.7* 06/21/2013    GLU 229* 06/21/2013    ALB 2.7* 06/21/2013    HGB 9.5* 06/21/2013    HCT 28.8* 06/21/2013    WBC 7.69 06/21/2013    PLT 193 06/21/2013         Current Diet Order  Diet consistent carbohydrate     RO/Hemodialysis Machine Safety Checks - Before Each Treatment:  RO/Hemodialysis Archivist Number: 3  (06/21/13 0912)  RO #: 3  (06/21/13 0912)  Water Hardness: 0  (06/21/13 0912)  pH: 7.6  (06/21/13 0912)  Pressure Test Verified: Yes (06/21/13 0912)  Alarms Verified: Passed (06/21/13 0912)  Machine Temperature: 97.7 F (36.5 C) (06/21/13 0912)  Alarms Verified: Yes (06/21/13 0912)  Hemodialysis Conductivity (Machine): 13.9  (06/21/13 0912)  Hemodialysis Conductivity (Meter): 13.8  (06/21/13 0912)  RO Machine Log Completed: Yes (06/21/13 0912)    Chlorine Testing:  RO/Hemodialysis Machine Safety Checks  Is Total Chlorine less than 0.1 ppm?: Yes (06/21/13 0912)  Orignial Total Chlorine Testing Time: 0800 (06/21/13 0912)    Vitals and Weight:  Patient Vitals for the past 4 hrs:   BP Temp Pulse Resp   06/21/13 1351 114/57 mmHg 97.2 F (36.2 C) 61  -   06/21/13 1322 111/57 mmHg 96.6 F (35.9 C) 66  20    06/21/13 1317 120/60 mmHg - 66  -   06/21/13 1300 121/68 mmHg - 67  -   06/21/13 1234 114/62 mmHg - 64  -   06/21/13 1214 123/62 mmHg - 61  -   06/21/13 1153 133/61 mmHg - 62  -   06/21/13 1134 129/66 mmHg - 62  -   06/21/13 1113 116/59 mmHg - 63  -   06/21/13 1053 129/62 mmHg - 62  -   06/21/13 1034  124/59 mmHg - 64  -   06/21/13 1013 99/52 mmHg - 66  -        Dialysis Weight  Pre-Treatment Weight (Kg): 88.9  (06/21/13 0912)  Scale Type: Floor Scale (06/21/13 0912)       Treatment:   06/21/13 0912 06/21/13 0936   Vitals   Temp ! 96.9 F (36.1 C) --    Heart Rate 65  64    BP 133/59 mmHg 123/56 mmHg   O2 Device None (Room air) --    Journalist, newspaper Yes --    Blood Flow Rate (mL/min) 350 mL/min 350 mL/min   Arterial Pressure (mmHg) -230 mmHg -160 mmHg   Venous Pressure (mmHg) 100  121    Dialysate Flow Rate (mL/min) 700 mL/min 700 mL/min   Transmembrane Pressure (mmHg) 50 mmHg 52 mmHg   Ultrafiltration Rate (mL/Hr) 600 mL/hr 600 mL/hr   Fluid Removal (ml) 0 ml 250 ml  Dialysate K (mEq/L) 3 mEq/L 3 mEq/L   Dialysate CA (mEq/L) 2.5 mEq/L 2.5 mEq/L   Hemodialysis Comments   Arteriovenous Lines Secure Yes Yes   Comments pre weigh, lidocaine cream applied pre dialysis  --       06/21/13 0954 06/21/13 1013 06/21/13 1034   Vitals   Heart Rate 61  66  64    BP 113/60 mmHg 99/52 mmHg 124/59 mmHg   Machine Metrics   Blood Flow Rate (mL/min) 350 mL/min 350 mL/min 350 mL/min   Arterial Pressure (mmHg) -162 mmHg -170 mmHg -169 mmHg   Venous Pressure (mmHg) 126  126  125    Dialysate Flow Rate (mL/min) 700 mL/min 700 mL/min 700 mL/min   Transmembrane Pressure (mmHg) 53 mmHg 56 mmHg 50 mmHg   Ultrafiltration Rate (mL/Hr) 600 mL/hr 600 mL/hr 460 mL/hr   Fluid Removal (ml) 440 ml 621 ml 778 ml   Fluid Bolus (ml) --  100 ml --    Dialysate K (mEq/L) 3 mEq/L 3 mEq/L 3 mEq/L   Dialysate CA (mEq/L) 2.5 mEq/L 2.5 mEq/L 2.5 mEq/L   Hemodialysis Comments   Arteriovenous Lines Secure Yes Yes Yes   Comments --  ns bp support, lowered goal  --        06/21/13 1053   Vitals   Heart Rate 62    BP 129/62 mmHg   Machine Metrics   Blood Flow Rate (mL/min) 350 mL/min   Arterial Pressure (mmHg) -168 mmHg   Venous Pressure (mmHg) 126    Dialysate Flow Rate (mL/min) 700 mL/min   Transmembrane Pressure (mmHg)  54 mmHg   Ultrafiltration Rate (mL/Hr) 460 mL/hr   Fluid Removal (ml) 927 ml   Fluid Bolus (ml) --    Dialysate K (mEq/L) 3 mEq/L   Dialysate CA (mEq/L) 2.5 mEq/L   Hemodialysis Comments   Arteriovenous Lines Secure Yes   Comments --       06/21/13 1053 06/21/13 1113 06/21/13 1120   Vitals   Heart Rate 62  63  --    BP 129/62 mmHg 116/59 mmHg --    Machine Metrics   Blood Flow Rate (mL/min) 350 mL/min 350 mL/min 350 mL/min   Arterial Pressure (mmHg) -168 mmHg -174 mmHg -171 mmHg   Venous Pressure (mmHg) 126  134  164    Dialysate Flow Rate (mL/min) 700 mL/min 700 mL/min --    Transmembrane Pressure (mmHg) 54 mmHg 54 mmHg 52 mmHg   Ultrafiltration Rate (mL/Hr) 460 mL/hr 460 mL/hr 460 mL/hr   Fluid Removal (ml) 927 ml 1081 ml --    Dialysate K (mEq/L) 3 mEq/L 3 mEq/L --    Dialysate CA (mEq/L) 2.5 mEq/L 2.5 mEq/L --    Hemodialysis Comments   Arteriovenous Lines Secure Yes Yes --    Comments --  no hd complaints, talking on the  phone  pt complaining of left leg pain, requested pain medication        06/21/13 1134 06/21/13 1153   Vitals   Heart Rate 62  62    BP 129/66 mmHg 133/61 mmHg   Machine Metrics   Blood Flow Rate (mL/min) 350 mL/min 350 mL/min   Arterial Pressure (mmHg) -173 mmHg -162 mmHg   Venous Pressure (mmHg) 204  193    Dialysate Flow Rate (mL/min) 700 mL/min 700 mL/min   Transmembrane Pressure (mmHg) 39 mmHg 26 mmHg   Ultrafiltration Rate (mL/Hr) 460 mL/hr 460 mL/hr   Fluid Removal (ml) 1234 ml 1382 ml  Dialysate K (mEq/L) 3 mEq/L 3 mEq/L   Dialysate CA (mEq/L) 2.5 mEq/L 2.5 mEq/L   Hemodialysis Comments   Arteriovenous Lines Secure Yes Yes   Comments Pt received percocet by primary RN --             Intake/Output:  I/O this shift:  In: 240 [P.O.:240]  Out: 1200 [Other:1200]      Medications:  Current Facility-Administered Medications   Medication Dose Route Last Rate Last Dose   . heparin (porcine) injection 2,000 Units  2,000 Units Intravenous   2,000 Units at 06/21/13 0925   . sodium chloride 0.9 %  bolus 100 mL  100 mL Other       . sodium chloride 0.9 % bolus 250 mL  250 mL Intravenous            Education:  Education  Person taught: Patient (06/21/13 1351)  Knowledge basis: Substantial (06/21/13 1351)  Topics taught: Procedure (06/21/13 1351)    Primary Nurse Communication:  Bedside Nurse Communication  Name of bedside RN - pre dialysis: [facility]as[facility]  (06/21/13 1351)  Name of bedside RN - post dialysis: Chasity (06/21/13 1351)      [facility] nurse signature/date/time: Reynaldo Minium 06/21/13 1400

## 2013-06-21 NOTE — Consults (Signed)
Service Date: 06/21/2013     Patient Type: I     CONSULTING PHYSICIAN: Leontine Locket MD     REFERRING PHYSICIAN: Toy Baker MD     CONSULTING SERVICE:  Infectious Disease     REASON FOR CONSULTATION:  Antibiotic recommendations.     HISTORY OF PRESENT ILLNESS:  The patient is a very pleasant 67 year old female with a known past medical  history significant for end-stage renal disease on hemodialysis every  Tuesday, Thursday, and Saturday under Dr. Emelda Fear, type 2  diabetes, anemia and hypertension.  She presents to the hospital with a  chief complaint of left lower extremity pain and cellulitis.  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  She was appropriately pancultured including  2 sets of blood cultures and started on IV antibiotics, given the clinical  presentation of cellulitis.  She was started on IV doxycycline by the ER  and is currently on doxycycline as well as Rocephin.  I was asked to see  the patient for further recommendations.  To date since admission, her pain  has greatly improved.  There is cellulitis of the left lower extremity, but  again also has clinically improved while on therapy.  I am seeing her for  the first time today on June 21, 2013.  She is resting comfortably in no  apparent distress, appears nontoxic, undergoing hemodialysis in her room.     PAST MEDICAL HISTORY:  Again is listed above including end-stage renal disease, type 2 diabetes,  anemia, hypertension, history of arthritis, bilateral cataracts and  gastroesophageal reflux disease.     SOCIAL HISTORY:  She is a nonsmoker, nondrinker, non-IV drug abuser.  No recent travel  outside of [place].     FAMILY HISTORY:  Noncontributory towards the patient's current condition.     CURRENT MEDICATIONS:  As per [facility] including the Rocephin x1 dose IV doxycycline.     ALLERGIES:  She has no known drug allergies except for PENICILLINS and SHRIMP.      REVIEW OF SYSTEMS:  Again significant for the pain, heat, tenderness and erythema to her left  lower extremity.  She does recall during her birthday celebration several  days ago she did inadvertently cause trauma to her lower extremity hitting  it against a piece of furniture.  This could be the cause that prompted the  cause of her initiation of cellulitis.     PHYSICAL EXAMINATION:  GENERAL:  She is hemodynamically stable and afebrile.  HEENT:  Unremarkable.  NECK:  Supple.  LUNGS:  Grossly clear.  HEART:  S1, S2.  ABDOMEN:  Soft, benign.  EXTREMITIES:  The left lower extremity cellulitis below the knee with  erythema, redness, heat production and pain.  NEUROLOGIC:  No focal deficits, mood normal affect.     LABORATORY DATA:  White count of 7.7, H and H of 10/30, platelet count of 193 normal  differential; BUN and creatinine are 74/5.2 as expected given her known  end-stage renal disease on dialysis, glucose of 229.  Blood cultures are  pending.  Venous Doppler confirming chronic DVT, nothing acute, there are  chronic thrombosis seen at the left popliteal vein.  No acute DVT mentioned  or seen.     CONCLUSION:  This is a very pleasant 67 year old female with left lower extremity  cellulitis already clinically improving on the antibiotics started by the  ER including Rocephin x1  and doxycycline.  Given the fact that she is a  dialysis patient undergoing dialysis 3 times a week I will modify her  antibiotics to as follows:  I will discontinue both the Rocephin and the  doxycycline now and start her on vancomycin will give 1 dose of vancomycin  1 g post-dialysis today and continue to follow the patient clinically.  If  she continues to improve on vancomycin she will be able to be discharged  home with a 7 to 10-day course of IV vancomycin post-dialysis at her  dialysis center.  This should be appropriate for the treatment and  management of her left lower extremity cellulitis.  We will follow up on  the cultures  and if these cultures do return back negative and there is no  evidence of any additional potential bacteremia.  The vancomycin should  suffice upon her discharge.     Thanks again for this very interesting consultation, further  recommendations to follow.  Overall time of consultation took 60 minutes.           D:  06/21/2013 11:10 AM by Dr. Eddie Dibbles B. Zadiel [place] MD ([id number])  T:  06/21/2013 14:44 PM by       Lorin [id number]) (Doc [id number])

## 2013-06-21 NOTE — H&P (Signed)
ADMISSION HISTORY AND PHYSICAL EXAM    Date Time: 06/21/2013 1:57 AM  Patient Name: Mary [name]  Attending Physician: Toy Baker, MD    Assessment:   CELLULITIS-LEFT LOWER EXTREMITY: admit to [facility], IV antibiotics, ID consult, pain control, elevation and monitor, Doppler depicts chronic thrombus, Pt reports she is aware and has been treated for the same, blood cultures drawn in the ED, monitor  ESRD: Dialysis Tuesday, Thursday and Saturday, Dr. Leonette Most on board, defer to him for dialysis orders.  DM-II: continue home lantus, consistent carb diet, SSNI, accuhecks, optimize BG control  Anemia: chronic, secondary to renal disease, monitor  Hypertension: optimize BP control in the hospital          Plan:   See above    History of Present Illness:   Mary [name] is a 67 y.o. female who presents to the hospital with swelling, redness, tenderness and warmth affecting the left lower extremity. Pt reports symptoms started 3 days back and progressively getting worse. Denies fever, chills or trauma to the leg. Pt has ESRD and receives dialysis Tuesday, Thursday and Saturday. Pt denies any HA, sore throat, neck pain, CP, SOB, cough, abd pain, N/V/D, focal neurological deficits or any other concerns. Pt reports that she was diagnosed with a blood clot in left leg many years back, took blood thinners as prescribed and was taken off the blood thinners many years back.     Past Medical History:     Past Medical History   Diagnosis Date   . Diabetes mellitus without complication    . Hyperlipidemia    . Hypertensive disorder    . Chronic kidney disease      dialysis t-tr-sat   . Arthritis    . Cataracts, bilateral    . GERD (gastroesophageal reflux disease)        Past Surgical History:     Past Surgical History   Procedure Date   . Av fistula placement 2012       Family History:   History reviewed. No pertinent family history.    Social History:     History     Social History   . Marital Status: Widowed     Spouse Name:  N/A     Number of Children: N/A   . Years of Education: N/A     Social History Main Topics   . Smoking status: Never Smoker    . Smokeless tobacco: Not on file   . Alcohol Use: No   . Drug Use: No   . Sexually Active:      Other Topics Concern   . Not on file     Social History Narrative   . No narrative on file       Allergies:     Allergies   Allergen Reactions   . Penicillins Hives   . Shrimp (Shellfish Allergy) Swelling       Medications:     Prescriptions prior to admission   Medication Sig   . acetaminophen (TYLENOL) 500 MG tablet Take 500 mg by mouth every 6 (six) hours as needed.     Marland Kitchen amLODIPine (NORVASC) 10 MG tablet Take 10 mg by mouth daily.     Marland Kitchen aspirin 81 MG tablet Take 81 mg by mouth daily.     Marland Kitchen atorvastatin (LIPITOR) 10 MG tablet Take 10 mg by mouth daily.     Marland Kitchen b complex-vitamin c-folic acid (NEPHRO-VITE) 0.8 MG TABS Take 0.8 mg by mouth.   Marland Kitchen  bisacodyl (DULCOLAX) 5 MG EC tablet Take 5 mg by mouth daily as needed.     Marland Kitchen esomeprazole (NEXIUM) 40 MG capsule Take 40 mg by mouth every morning before breakfast.     . insulin aspart (NOVOLOG) 100 UNIT/ML injection Inject 1-10 Units into the skin 3 (three) times daily before meals.     . insulin glargine (LANTUS) 100 UNIT/ML injection Inject 10-20 Units into the skin nightly.     . pregabalin (LYRICA) 75 MG capsule Take 75 mg by mouth 2 (two) times daily.     . traMADOL (ULTRAM) 50 MG tablet Take 1 tablet (50 mg total) by mouth every 6 (six) hours as needed for Pain.       Review of Systems:   A comprehensive review of systems was: Negative except as above on a 10 system ROS    Physical Exam:     Filed Vitals:    06/20/13 2111   BP: 112/67   Pulse: 74   Temp: 97 F (36.1 C)   Resp: 18   SpO2: 97%       Intake and Output Summary (Last 24 hours) at Date Time  No intake or output data in the 24 hours ending 06/21/13 0157    General appearance - in mild to moderate distress  Mental status - alert, oriented to person, place, and time  Eyes - pupils equal and  reactive, extraocular eye movements intact  Neck - supple, no significant adenopathy  Chest - scattered basilary crackles  Heart - S1 and S2 normal  Abdomen - soft, nontender, nondistended, no masses or organomegaly  Neurological - alert, oriented, normal speech, no focal findings or movement disorder noted  Musculoskeletal - no joint tenderness, deformity or swelling  Extremities - abnormal exam of redness, warmth and tenderness circumfrential left LE, fistula left UE,   Skin - as above, warm    Labs:     Results     Procedure Component Value Units Date/Time    Blood Culture #1 [id number] Collected:06/20/13 1936    Specimen Information:Blood / Blood Updated:06/20/13 2143    Narrative:    63ml required    Blood Culture #2 [id number] Collected:06/20/13 1804    Specimen Information:Blood / Blood Updated:06/20/13 2101    Narrative:    13ml required    CBC and differential [id number]  (Abnormal) Collected:06/20/13 1746    Specimen Information:Blood / Blood Updated:06/20/13 1844     WBC 9.62      RBC 3.45 (L)      Hgb 10.8 (L) g/dL      Hematocrit 31.8 (L) %      MCV 92.2 fL      MCH 31.3 pg      MCHC 34.0 g/dL      RDW 14 %      Platelets 177      MPV 11.9 fL      Neutrophils 66 %      Lymphocytes Automated 25 %      Monocytes 7 %      Eosinophils Automated 2 %      Basophils Automated 0 %      Immature Granulocyte 1 %      Nucleated RBC 0      Neutrophils Absolute 6.31      Abs Lymph Automated 2.43      Abs Mono Automated 0.64      Abs Eos Automated 0.21      Absolute Baso Automated 0.03  Absolute Immature Granulocyte 0.06 (H)     Basic Metabolic Panel [id number]  (Abnormal) Collected:06/20/13 1746    Specimen Information:Blood Updated:06/20/13 1811     Glucose 188 (H) mg/dL      BUN 69 (H) mg/dL      Creatinine 5.8 (H) mg/dL      CALCIUM 9.5 mg/dL      Sodium 142      Potassium 4.4      Chloride 102      CO2 23      Anion Gap 17.0 (H)     GFR [id number] Collected:06/20/13 1746     EGFR 8.7 Updated:06/20/13 1811           Recent CBC   Recent Labs   Basename 06/20/13 1746    RBC 3.45*    HGB 10.8*    HCT 31.8*    MCV 92.2    MCH 31.3    MCHC 34.0    RDW 14    MPV 11.9    LABPLAT --     Recent BMP   Recent Labs   Basename 06/20/13 1746    GLU 188*    BUN 69*    CREAT 5.8*    CA 9.5    NA 142    K 4.4    CL 102    CO2 23       Rads:   Radiological Procedure reviewed.    HISTORY: Left lower extremity edema and erythema; evaluate for DVT.   FINDINGS: Pearline Cables scale, color Doppler and spectral sonography of the left   lower extremity veins was performed. Peripheral linear echogenic   thrombus is present in the left popliteal vein and is compressible. This   appears chronic.. The other left lower extremity deep venous structures   are widely patent and compressible. No detectable acute intraluminal   thrombus. Right common femoral vein is patent.   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 No evidence of   acute DVT.   Susy [place], MD   06/20/2013 7:19 PM              Signed by: Toy Baker

## 2013-06-21 NOTE — Treatment Plan (Signed)
Consult dictated  Dx: LLE cellulitis  Give vanco post hd; dose adjust thereafter while in-patient; upon d/c may administer at HD center  Check cultures  Continue excellent nursing supportative care  Will follow

## 2013-06-21 NOTE — Consults (Signed)
[facility]   Consultation    Date Time: 06/21/2013 4:56 PM  Patient Name: Mary [name], Mary [name]  Medical Record Number: [id number]   Primary Care Physician: @[email or web address] @   Requesting Physician: Toy Baker, MD    Reason for Consultation:   ESRD  Assessment:   ESRD  Left LE cellulitis  Plan:   Hemodialysis today    We will follow this patient closely with you. Thank you for allowing [place] to participate in the care of this patient.    Alric Quan, MD  Office - [phone]      History:   Mary [name] is a 67 y.o. female who presents to the hospital on 06/20/2013 with PMH notable for  ESRD on hemodialysis tue, thur, sat, DM, HTN, admitted because of left lower extremity cellulitis. Renal consult requested for management of kidney failure. Pt was last hemodialysed on Saturday. Denies any SOB.    Past Medical and Surgical  History:     Past Medical History   Diagnosis Date   . Diabetes mellitus without complication    . Hyperlipidemia    . Hypertensive disorder    . Chronic kidney disease      dialysis t-tr-sat   . Arthritis    . Cataracts, bilateral    . GERD (gastroesophageal reflux disease)        Family History:     Social History:   No tobacco  No EtOH  No IVDA  Married  Allergies:     Allergies   Allergen Reactions   . Penicillins Hives   . Shrimp (Shellfish Allergy) Swelling     Medications:     Current Facility-Administered Medications   Medication Dose Route Frequency   . amLODIPine  10 mg Oral Daily   . aspirin  81 mg Oral Daily   . atorvastatin  10 mg Oral QPM   . b complex-vitamin c-folic acid  1 tablet Oral Daily   . [COMPLETED] cefTRIAXone  1 g Intravenous Once   . enoxaparin  30 mg Subcutaneous QAM   . [COMPLETED] HYDROcodone-acetaminophen  1 tablet Oral Once   . insulin glargine  12 Units Subcutaneous QHS   . pantoprazole  40 mg Oral QAM AC   . pregabalin  75 mg Oral Q12H [place]   . [COMPLETED] vancomycin  1,000 mg Intravenous Once   . [DISCONTINUED] doxycycline  100 mg  Intravenous Q12H     Review of Systems:   12 systems were reviewed and were negative except for the following:Left leg pain   Physical Exam:   Temp:  [96.6 F (35.9 C)-98.3 F (36.8 C)] 97.2 F (36.2 C)  Heart Rate:  [61-77] 61   Resp Rate:  [16-20] 20   BP: (99-150)/(52-68) 114/57 mmHg    Intake and Output Summary (Last 24 hours) at Date Time    Intake/Output Summary (Last 24 hours) at 06/21/13 1656  Last data filed at 06/21/13 1351   Gross per 24 hour   Intake    891 ml   Output   1200 ml   Net   -309 ml       Gen: WN WD NAD  HEENT: NC/AT, moist MM, clear oropharynx  Neck: No JVD  CV: S1 S2 N RRR no M/R/GC  Chest: Good effort, CTAB  Ab: ND NT Soft no HSM +BS  Skin: Dry  Ext: left leg cellulitis  Psych: Appropriate mood and affect    Labs:     Lab 06/21/13  [id number] 06/20/13 1746   GLU 229* 188*   BUN 74* 69*   CREAT 5.2* 5.8*   CA 9.1 9.5   NA 142 142   K 4.1 4.4   CL 104 102   CO2 23 23   ALB 2.7* --   PHOS 5.7* --   MG -- --       Lab 06/21/13 0520 06/20/13 1746   WBC 7.69 9.62   RBC 3.11* 3.45*   HGB 9.5* 10.8*   HCT 28.8* 31.8*   MCV 92.6 92.2   MCH 30.5 31.3   MCHC 33.0 34.0   RDW 14 14   MPV 11.8 11.9   PLT 193 177     Radiology:   US Venous Low Extrem Duplx Dopp Uni Left    06/20/2013   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 No evidence of acute DVT.  Susy Manor, MD  06/20/2013 7:19 PM     EKG:     Prior Records:   I reviewed her  old records.

## 2013-06-21 NOTE — Plan of Care (Signed)
Problem: Health Promotion  Goal: Knowledge - disease process  Extent of understanding conveyed about a specific disease process.   Outcome: Progressing  Patient has cellulitis on the LLE, and with 3+ edema. Purple skin color with pulses.  pain level is at 6 out of 10, and no nausea and vomiting.  Blood glucose last night was 279. Waiting for Dr [name] to enter the admission orders since 2300.  Patient is also a dialysis patient Mary [name], and Sat    Problem: Safety  Goal: Patient will be free from injury during hospitalization  Outcome: Progressing  Patient is alert and oriented times three. Able to ambulate with cane and steady gait.  Bed alarm is still on for safety and asked patient to call before getting up.    Problem: Pain  Goal: Patient's pain/discomfort is manageable  Outcome: Progressing  Patient complaints of left leg pain 6 out of 10. No pain medications given so far.     Problem: Potential for Compromised Skin Integrity  Goal: Skin integrity is maintained or improved  Assess and monitor skin integrity. Identify patients at risk for skin breakdown on admission and per policy. Collaborate with interdisciplinary team and initiate plans and interventions as needed.   Outcome: Progressing  Patient has cellulitis on the LLE, with 3+ edema. No opening and any drainage. Skin is purple in color, warm and hard to touch.    Problem: Moderate/High Fall Risk Score >/=15  Goal: Patient will remain free of falls  Outcome: Progressing  Patient is alert and oriented times three. Able to ambulate with cane and steady gait.  Bed alarm is still on for safety and asked patient to call before getting up.

## 2013-06-22 LAB — CBC
Hematocrit: 27.7 % — ABNORMAL LOW (ref 37.0–47.0)
Hgb: 9.2 g/dL — ABNORMAL LOW (ref 12.0–16.0)
MCH: 30.5 pg (ref 28.0–32.0)
MCHC: 33.2 g/dL (ref 32.0–36.0)
MCV: 91.7 fL (ref 80.0–100.0)
MPV: 11.4 fL (ref 9.4–12.3)
Nucleated RBC: 0 (ref 0–1)
Platelets: 160 (ref 140–400)
RBC: 3.02 — ABNORMAL LOW (ref 4.20–5.40)
RDW: 14 % (ref 12–15)
WBC: 7.64 (ref 3.50–10.80)

## 2013-06-22 LAB — POCT GLUCOSE
Whole Blood Glucose POCT: 130 mg/dL — AB (ref 70–100)
Whole Blood Glucose POCT: 186 mg/dL — AB (ref 70–100)
Whole Blood Glucose POCT: 289 mg/dL — AB (ref 70–100)
Whole Blood Glucose POCT: 183 mg/dL — AB (ref 70–100)

## 2013-06-22 LAB — VANCOMYCIN, RANDOM
Vancomycin Random: 16.2 ug/mL
Vancomycin Time of Last Dose: 1149

## 2013-06-22 LAB — HEMOGLOBIN A1C: Hemoglobin A1C: 8 % — ABNORMAL HIGH (ref 0.0–6.0)

## 2013-06-22 MED ORDER — VANCOMYCIN HCL 1000 MG IV SOLR
1000.0000 mg | Freq: Once | INTRAVENOUS | Status: AC
Start: 2013-06-23 — End: 2013-06-23
  Administered 2013-06-23: 1000 mg via INTRAVENOUS
  Filled 2013-06-22: qty 1000

## 2013-06-22 MED ORDER — GUAIFENESIN-CODEINE 100-10 MG/5ML PO SYRP
5.0000 mL | ORAL_SOLUTION | ORAL | Status: DC | PRN
Start: 2013-06-22 — End: 2013-06-23

## 2013-06-22 MED ORDER — CALCIUM CARBONATE ANTACID 500 MG PO CHEW
500.0000 mg | CHEWABLE_TABLET | Freq: Once | ORAL | Status: AC
Start: 2013-06-22 — End: 2013-06-22
  Administered 2013-06-22: 500 mg via ORAL
  Filled 2013-06-22: qty 1

## 2013-06-22 NOTE — Progress Notes (Addendum)
[facility]  HOSPITALIST  PROGRESS NOTE      Patient: Mary [name]  Date: 06/22/2013   LOS: 2 Days  Admission Date: 06/20/2013   MRN: [id number]  Attending: Garen Grams MD     ASSESSMENT/PLAN     Mary [name] is a 67 y.o. female admitted with   Active Hospital Problems    Diagnosis   . Cellulitis of left leg   . ESRD (end stage renal disease)   . DM (diabetes mellitus)   . Hypertension   . Cellulitis     1.  Cellulitis of left leg: completes 48 hours from blood culture collection overnight.  Will get report by am.  Therefore, she will remain in hospital overnight and get dialysis in am.  She should go home thereafter if blood cultures remain negative per ID recs to complete 10 days of treatment.  Repeat vancomycin level in am to see if she needs redosing in am.    2.  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    3.  HTN: appears to be reasonably controlled on her home regimen.    4.  DM: continued on home regimen of medications.    Nutrition: Renal Diet  GI Prophylaxis: not indicated  DVT Prophylaxis: heparin    Code Status: full code    DISPO: home with family    Care Plan discussed with nursing, consultants, case manager.    Family Contact: daughter updated on phone    SUBJECTIVE     Mary [name] reports feeling better with respect to her left leg.  Less swollen and less tender compared to admission.  Feels ok.  Interested in going home.    MEDICATIONS     Current Facility-Administered Medications   Medication Dose Route Frequency   . amLODIPine  10 mg Oral Daily   . aspirin  81 mg Oral Daily   . atorvastatin  10 mg Oral QPM   . b complex-vitamin c-folic acid  1 tablet Oral Daily   . enoxaparin  30 mg Subcutaneous QAM   . insulin glargine  12 Units Subcutaneous QHS   . pantoprazole  40 mg Oral  QAM AC   . pregabalin  75 mg Oral Q12H [place]   . [START ON 06/23/2013] vancomycin  1,000 mg Intravenous Once in dialysis       ROS     Remainder of 10 point ROS as above or otherwise negative    PHYSICAL EXAM     Filed Vitals:    06/22/13 1716   BP: 108/63   Pulse: 73   Temp: 97.2 F (36.2 C)   Resp: 18   SpO2: 95%       Temperature: Temp  Min: 97.2 F (36.2 C)  Max: 97.8 F (36.6 C)  Pulse: Pulse  Min: 65   Max: 73   Respiratory: Resp  Min: 18   Max: 20   Non-Invasive BP: BP  Min: 108/63  Max: 124/62  Pulse Oximetry SpO2  Min: 95 %  Max: 97 %    Intake and Output Summary (Last 24 hours) at Date Time    Intake/Output Summary (Last 24 hours) at 06/22/13 1826  Last data filed at 06/22/13 0538   Gross per 24 hour  Intake    453 ml   Output      0 ml   Net    453 ml       GEN APPEARANCE: No acute distress, alert and oriented times 3  HEENT: PERLA; Extraocular movements intact; sclera anicteric  NECK: Supple; No bruits, No JVP elevation  CARDIAC: Regular rate and rhythm, S1, S2; no murmur, gallop or rub  LUNGS: Clear to auscultation  Without wheezes;rhonchi or rales  ABD: Soft; No TTP; + Normoactive BS, no masses, rebound or guarding  EXT: No edema; Pulses 2+ bilaterally  SKIN: No rash or Lesions  NEURO: CN 2-12 intact; No focal neurological deficits      LABS       Lab 06/22/13 0607 06/21/13 0520 06/20/13 1746   WBC 7.64 7.69 9.62   RBC 3.02* 3.11* 3.45*   HGB 9.2* 9.5* 10.8*   HCT 27.7* 28.8* 31.8*   MCV 91.7 92.6 92.2   PLT 160 193 177         Lab 06/21/13 0922 06/20/13 1746   NA 142 142   K 4.1 4.4   CL 104 102   CO2 23 23   BUN 74* 69*   CREAT 5.2* 5.8*   GLU 229* 188*   CA 9.1 9.5   MG -- --         Lab 06/21/13 0922   ALT --   AST --   GGT --   BILITOTAL --   BILIDIRECT --   ALB 2.7*   ALKPHOS --       No results found for this basename: CK:3,CKMB:3,CKMBINDEX:3,TROPI:3 in the last 168 hours    No results found for this basename: INR:3,PT:3,PTT:3 in the last 168 hours      RADIOLOGY     Radiological procedures  reviewed.    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 No evidence of     acute DVT.        Susy Manor, MD     06/20/2013 7:19 PM       Signed,  Garen Grams, MD  6:26 PM 06/22/2013

## 2013-06-22 NOTE — Progress Notes (Signed)
[facility] ID PROGRESS NOTE    Date Time: 06/22/2013 4:18 PM  Patient Name: Mary [name], Mary [name]      Assessment:   LLE cellulitis  ESRF on HD Tuesday, Thursday and Saturdays  DM  Anemia  HTN  Chronic L popliteal thrombus  GERD   Bilateral cataracts    Plan:    Continue IV Vancomycin prn levels on HD for 10 days, last dose given was 06/21/13   Clinically improving, patient report 50 % better.   OK with discharge of BC negative at 48 hours   Follow up in the office after discharge in 10 days.    Subjective:   Still with erythema, warmth and pain. Reports difficulty walking due to discomfort. Tolerating her Vancomycin with 50% improvement in her LLE sxs.    Medications:     Current Facility-Administered Medications   Medication Dose Route Frequency   . amLODIPine  10 mg Oral Daily   . aspirin  81 mg Oral Daily   . atorvastatin  10 mg Oral QPM   . b complex-vitamin c-folic acid  1 tablet Oral Daily   . enoxaparin  30 mg Subcutaneous QAM   . insulin glargine  12 Units Subcutaneous QHS   . pantoprazole  40 mg Oral QAM AC   . pregabalin  75 mg Oral Q12H [place]   . [START ON 06/23/2013] vancomycin  1,000 mg Intravenous Once in dialysis       Review of Systems:   A comprehensive review of systems was: History obtained from chart review and the patient  Respiratory ROS: negative for - cough, shortness of breath or sputum changes  Cardiovascular ROS: negative for - chest pain  Gastrointestinal ROS: negative for - abdominal pain, diarrhea or nausea/vomiting  Discomfort in LLE with dependency and ambulation  Physical Exam:     Filed Vitals:    06/22/13 0624   BP: 124/62   Pulse: 65   Temp: 97.5 F (36.4 C)   Resp: 18   SpO2: 96%     Temp (24hrs), Avg:97.7 F (36.5 C), Min:97.5 F (36.4 C), Max:97.8 F (36.6 C)    Intake and Output Summary (Last 24 hours) at Date Time    Intake/Output Summary (Last 24 hours) at 06/22/13 1618  Last data filed at 06/22/13 0538   Gross per 24 hour   Intake    453 ml   Output      0 ml   Net    453 ml        General appearance - Nontoxic and NAD  Mental status - alert, normal mood  HEENT-NC/AT, anicteric  Chest - lungs clear  Heart - S1 and S2 with out murmur  Abdomen - soft and nontender  Neurological - not examined  Extremities - LLE with wrinkles of the skin, area above the ankle with pigmentation change and slight erythema, warmth and tenderness  Skin - no drug rash  LUE HD fistula with bruit      Labs:     Results     Procedure Component Value Units Date/Time    POCT glucose (AC, HS, and 0300) [id number]  (Abnormal) Collected:06/22/13 1147     POCT Glucose WB 186 (A) mg/dL Updated:06/22/13 1147    Hemoglobin A1C [[id number]]  (Abnormal) Collected:06/22/13 0607    Specimen Information:Blood Updated:06/22/13 1026     Hemoglobin A1C 8.0 (H) %     POCT glucose (AC, HS, and 0300) [id number]  (Abnormal) Collected:06/22/13 0718     POCT  Glucose WB 130 (A) mg/dL Updated:06/22/13 0719    Vancomycin, random [id number] Collected:06/22/13 0607    Specimen Information:Blood Updated:06/22/13 0701     Vancomycin Random 16.2 ug/mL      Vancomycin Time of Last Dose 1149      Vancomycin Date of Last Dose 06/21/2013     CBC [[id number]]  (Abnormal) Collected:06/22/13 0607    Specimen Information:Blood / Blood Updated:06/22/13 0629     WBC 7.64      RBC 3.02 (L)      Hgb 9.2 (L) g/dL      Hematocrit 27.7 (L) %      MCV 91.7 fL      MCH 30.5 pg      MCHC 33.2 g/dL      RDW 14 %      Platelets 160      MPV 11.4 fL      Nucleated RBC 0     Blood Culture #1 [id number] Collected:06/20/13 1936    Specimen Information:Blood / Blood Updated:06/21/13 2215    Narrative:    ORDER#: [id number]                                    ORDERED BY: Leana Roe, PATRICK  SOURCE: Blood Blood                                  COLLECTED:  06/20/13 19:36  ANTIBIOTICS AT COLL.:                                RECEIVED :  06/20/13 21:42  Culture Blood                              PRELIM      06/21/13 22:15  06/21/13   No Growth after 1 day/s of incubation.       Blood Culture #2 [id number] Collected:06/20/13 [id number]    Specimen Information:Blood / Blood Updated:06/21/13 2121    Narrative:    ORDER#: [id number]                                    ORDERED BY: Leana Roe, PATRICK  SOURCE: Blood Blood                                  COLLECTED:  06/20/13 18:04  ANTIBIOTICS AT COLL.:                                RECEIVED :  06/20/13 21:01  Culture Blood                              PRELIM      06/21/13 21:21  06/21/13   No Growth after 1 day/s of incubation.                Lab 06/21/13 0922 06/20/13 1746   NA 142 142   K 4.1 4.4   CL 104 102   CO2 23 23  BUN 74* 69*   CREAT 5.2* 5.8*   CA 9.1 9.5   ALB 2.7* --   PROT -- --   BILITOTAL -- --   ALKPHOS -- --   ALT -- --   AST -- --   GLU 229* 188*       Lab 06/21/13 0922   NA 142   K 4.1   CL 104   CO2 23   BUN 74*   CREAT 5.2*   EGFR 9.9   GLU 229*   CA 9.1       Lab 06/22/13 0607 06/21/13 0520 06/20/13 1746   WBC 7.64 7.69 9.62   HGB 9.2* 9.5* 10.8*   HCT 27.7* 28.8* 31.8*   PLT 160 193 177   NEUTROPCT -- -- --   MONOPCT -- -- --     No results found for this basename: CK in the last 168 hours  Recent LFT No results found for this basename: BILIT,BILID,BILII,AST,ALT,ALP,TP,ALB,GLOB,AG in the last 24 hours    Rads:     Radiology Results (24 Hour)     ** No Results found for the last 24 hours. **          Signed by: Chesley Noon

## 2013-06-22 NOTE — Plan of Care (Signed)
Problem: Health Promotion  Goal: Knowledge - disease process  Extent of understanding conveyed about a specific disease process.   Outcome: Progressing  Patient has cellulitis on the LLE, and improve today, less swollen and still in purple color and patient is able to move around more without the cane.  Blood glucose was 249 at night and covered with Novolog and Lantus.  Continue Plan of care, and treat infection and monitoring the blood glucose.    Problem: Safety  Goal: Patient will be free from injury during hospitalization  Outcome: Progressing  Patient is alert and oriented times three, and able to make her needs known. Bed alarm is on at all times for safety.    Problem: Pain  Goal: Patient's pain/discomfort is manageable  Outcome: Progressing  Patient c/o throbbing on the LLE, and percocet 1 tab po gave tolerated well, and pain level is down and able to sleep.  No nausea and vomiting.    Problem: Potential for Compromised Skin Integrity  Goal: Skin integrity is maintained or improved  Assess and monitor skin integrity. Identify patients at risk for skin breakdown on admission and per policy. Collaborate with interdisciplinary team and initiate plans and interventions as needed.   Outcome: Progressing  Patient has cellulitis on the LLE, with no opening and any drainage.     Problem: Moderate/High Fall Risk Score >/=15  Goal: Patient will remain free of falls  Patient is alert and oriented times three, and able to make her needs known. Bed alarm is on at all times for safety.

## 2013-06-22 NOTE — Progress Notes (Signed)
Mary [name] is a 67 y.o. female patient with PMH notable for DM, HTN, ESRD on hemodialysis, admitted because of cellulitis of left leg.    Active Problems:   Cellulitis of left leg   ESRD (end stage renal disease)   DM (diabetes mellitus)   Hypertension   Cellulitis    Past Medical History   Diagnosis Date   . Diabetes mellitus without complication    . Hyperlipidemia    . Hypertensive disorder    . Chronic kidney disease      dialysis t-tr-sat   . Arthritis    . Cataracts, bilateral    . GERD (gastroesophageal reflux disease)      Current Facility-Administered Medications   Medication Dose Route Frequency Provider Last Rate Last Dose   . acetaminophen (TYLENOL) tablet 650 mg  650 mg Oral Q4H PRN Toy Baker, MD       . acetaminophen (TYLENOL) tablet 650 mg  650 mg Oral Q4H PRN Toy Baker, MD       . amLODIPine (NORVASC) tablet 10 mg  10 mg Oral Daily Toy Baker, MD   10 mg at 06/22/13 0852   . aspirin chewable tablet 81 mg  81 mg Oral Daily Toy Baker, MD   81 mg at 06/22/13 0855   . atorvastatin (LIPITOR) tablet 10 mg  10 mg Oral QPM Toy Baker, MD   10 mg at 06/22/13 1742   . b complex-vitamin c-folic acid (NEPHRO-VITE) tablet 1 tablet  1 tablet Oral Daily Toy Baker, MD   1 tablet at 06/22/13 0853   . bisacodyl (DULCOLAX) EC tablet 5 mg  5 mg Oral QD PRN Toy Baker, MD   5 mg at 06/21/13 2255   . dextrose (GLUCOSE) 40 % oral gel 15 g  15 g Oral PRN Toy Baker, MD       . dextrose 50 % bolus 25 mL  25 mL Intravenous PRN Toy Baker, MD       . enoxaparin (LOVENOX) syringe 30 mg  30 mg Subcutaneous Darlina Guys, MD   30 mg at 06/22/13 0855   . glucagon (rDNA) (GLUCAGEN) injection 1 mg  1 mg Intramuscular PRN Toy Baker, MD       . insulin aspart (NovoLOG) injection 1-3 Units  1-3 Units Subcutaneous QHS and 0300 PRN Toy Baker, MD   1 Units at 06/21/13 2250   . insulin aspart (NovoLOG) injection 1-5 Units  1-5 Units Subcutaneous TID AC PRN Toy Baker, MD   1  Units at 06/21/13 [phone]   . insulin aspart (NovoLOG) injection 1-5 Units  1-5 Units Subcutaneous PRN Toy Baker, MD   3 Units at 06/22/13 1742   . insulin glargine (LANTUS) injection 12 Units  12 Units Subcutaneous QHS Toy Baker, MD   12 Units at 06/21/13 2250   . lidocaine (LMX PLUS 4%) 4 % dressing   Topical PRN Alric Quan, MD   1 each at 06/21/13 [phone]   . ondansetron (ZOFRAN) injection 4 mg  4 mg Intravenous Q8H PRN Toy Baker, MD       . oxyCODONE-acetaminophen (PERCOCET) 5-325 MG per tablet 1 tablet  1 tablet Oral Q4H PRN Toy Baker, MD   1 tablet at 06/22/13 0127   . pantoprazole (PROTONIX) EC tablet 40 mg  40 mg Oral QAM AC Toy Baker, MD   40 mg at 06/22/13 0851   . pregabalin (LYRICA) capsule 75 mg  75 mg Oral Q12H [facility] Toy Baker, MD  75 mg at 06/22/13 0856   . [START ON 06/23/2013] vancomycin (VANCOCIN) 1,000 mg in sodium chloride 0.9 % 250 mL IVPB  1,000 mg Intravenous Once in dialysis Leontine Locket, MD         Allergies   Allergen Reactions   . Penicillins Hives   . Shrimp (Shellfish Allergy) Swelling     Blood pressure 108/63, pulse 73, temperature 97.2 F (36.2 C), temperature source Oral, resp. rate 18, height 1.6 m (5' 2.99"), weight 78.4 kg (172 lb 13.5 oz), SpO2 95.00%.    Subjective:  Symptoms:  Stable.    Diet:  Adequate intake.    Activity level: Returning to normal.      Objective:  General Appearance:  In no acute distress.    Vital signs: (most recent): Blood pressure 108/63, pulse 73, temperature 97.2 F (36.2 C), temperature source Oral, resp. rate 18, height 1.6 m (5' 2.99"), weight 78.4 kg (172 lb 13.5 oz), SpO2 95.00%.    Output: No urine output and producing stool.    Lungs:  Normal effort.    Heart: Normal rate.    Neurological: Patient is alert.    Skin:  Warm.    Abdomen: Abdomen is soft.      Results     Procedure Component Value Units Date/Time    POCT glucose (AC, HS, and 0300) [id number]  (Abnormal) Collected:06/22/13 1718     POCT Glucose WB  289 (A) mg/dL Updated:06/22/13 1719    POCT glucose (AC, HS, and 0300) [id number]  (Abnormal) Collected:06/22/13 1147     POCT Glucose WB 186 (A) mg/dL Updated:06/22/13 1147    Hemoglobin A1C [[id number]]  (Abnormal) Collected:06/22/13 0607    Specimen Information:Blood Updated:06/22/13 1026     Hemoglobin A1C 8.0 (H) %     POCT glucose (AC, HS, and 0300) [id number]  (Abnormal) Collected:06/22/13 0718     POCT Glucose WB 130 (A) mg/dL Updated:06/22/13 0719    Vancomycin, random [id number] Collected:06/22/13 0607    Specimen Information:Blood Updated:06/22/13 0701     Vancomycin Random 16.2 ug/mL      Vancomycin Time of Last Dose 1149      Vancomycin Date of Last Dose 06/21/2013     CBC [[id number]]  (Abnormal) Collected:06/22/13 0607    Specimen Information:Blood / Blood Updated:06/22/13 0629     WBC 7.64      RBC 3.02 (L)      Hgb 9.2 (L) g/dL      Hematocrit 27.7 (L) %      MCV 91.7 fL      MCH 30.5 pg      MCHC 33.2 g/dL      RDW 14 %      Platelets 160      MPV 11.4 fL      Nucleated RBC 0     Blood Culture #1 [id number] Collected:06/20/13 1936    Specimen Information:Blood / Blood Updated:06/21/13 2215    Narrative:    ORDER#: [id number]                                    ORDERED BY: Leana Roe, PATRICK  SOURCE: Blood Blood                                  COLLECTED:  06/20/13 19:36  ANTIBIOTICS AT COLL.:  RECEIVED :  06/20/13 21:42  Culture Blood                              PRELIM      06/21/13 22:15  06/21/13   No Growth after 1 day/s of incubation.      Blood Culture #2 [id number] Collected:06/20/13 [id number]    Specimen Information:Blood / Blood Updated:06/21/13 2121    Narrative:    ORDER#: [id number]                                    ORDERED BY: Leana Roe, PATRICK  SOURCE: Blood Blood                                  COLLECTED:  06/20/13 18:04  ANTIBIOTICS AT COLL.:                                RECEIVED :  06/20/13 21:01  Culture Blood                              PRELIM       06/21/13 21:21  06/21/13   No Growth after 1 day/s of incubation.        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 No evidence of acute DVT.  Susy Manor, MD  06/20/2013 7:19 PM     Assessment:  (1.cellulitis of left leg  2.ESRD  3.DM  4.HTN).       Plan:   (1.Antibiotic coverage  2.hemodialysis in AM).       Alric Quan  06/22/2013

## 2013-06-23 LAB — POCT GLUCOSE
Whole Blood Glucose POCT: 86 mg/dL (ref 70–100)
Whole Blood Glucose POCT: 120 mg/dL — AB (ref 70–100)

## 2013-06-23 LAB — CBC
Hematocrit: 28.1 % — ABNORMAL LOW (ref 37.0–47.0)
Hgb: 9.4 g/dL — ABNORMAL LOW (ref 12.0–16.0)
MCH: 30.5 pg (ref 28.0–32.0)
MCHC: 33.5 g/dL (ref 32.0–36.0)
MCV: 91.2 fL (ref 80.0–100.0)
MPV: 10.9 fL (ref 9.4–12.3)
Nucleated RBC: 0 (ref 0–1)
Platelets: 176 (ref 140–400)
RBC: 3.08 — ABNORMAL LOW (ref 4.20–5.40)
RDW: 14 % (ref 12–15)
WBC: 7.48 (ref 3.50–10.80)

## 2013-06-23 LAB — BASIC METABOLIC PANEL
Anion Gap: 12 (ref 5.0–15.0)
BUN: 49 mg/dL — ABNORMAL HIGH (ref 7–19)
CO2: 23 (ref 22–29)
Calcium: 9.1 mg/dL (ref 8.5–10.5)
Chloride: 109 — ABNORMAL HIGH (ref 98–107)
Creatinine: 3.8 mg/dL — ABNORMAL HIGH (ref 0.6–1.0)
Glucose: 90 mg/dL (ref 70–100)
Potassium: 4.1 (ref 3.5–5.1)
Sodium: 144 (ref 136–145)

## 2013-06-23 LAB — VANCOMYCIN, RANDOM
Vancomycin Random: 12.8 ug/mL
Vancomycin Time of Last Dose: 1145

## 2013-06-23 LAB — GFR: EGFR: 14.3

## 2013-06-23 MED ORDER — SODIUM CHLORIDE 0.9 % IV BOLUS
100.0000 mL | INTRAVENOUS | Status: AC | PRN
Start: 2013-06-23 — End: 2013-06-23

## 2013-06-23 MED ORDER — SODIUM CHLORIDE 0.9 % IV BOLUS
250.0000 mL | INTRAVENOUS | Status: AC | PRN
Start: 2013-06-23 — End: 2013-06-23

## 2013-06-23 MED ORDER — HEPARIN SODIUM (PORCINE) 1000 UNIT/ML IJ SOLN
2000.0000 [IU] | INTRAMUSCULAR | Status: AC | PRN
Start: 2013-06-23 — End: 2013-06-23
  Administered 2013-06-23: 2000 [IU]
  Filled 2013-06-23: qty 2

## 2013-06-23 NOTE — Progress Notes (Signed)
Dialysis Treatment Note    Patient:  Mary [name] MRN#:  [id number]  Unit/Room/Bed:  [id number]   Isolation: None       Allergies:   Allergies   Allergen Reactions   . Penicillins Hives   . Shrimp (Shellfish Allergy) Swelling     Code Status: Full Code    Problem List:   Patient Active Problem List    Diagnosis Date Noted   . Cellulitis of left leg 06/21/2013   . ESRD (end stage renal disease) 06/21/2013   . DM (diabetes mellitus) 06/21/2013   . Hypertension 06/21/2013   . Cellulitis 06/21/2013       Dialysis Order:  Active Orders   Dialysis    Hemodialysis inpatient     Frequency: Once     Start Date/Time: 06/23/13 1208     Number of Occurrences:  1 Occurrences     Order Comments: Ok to admin vancomycin ordered on dialysis per no IV access     Order Questions:     . Date of Dialysis 06/23/2013     . K+ (Initial) 2 mEq     . KPP (Per Protocol) Yes     . Ca++ 2.5 mEq     . Bicarb 35 mEq     . Na+ 138 mEq     . Dialyzer F160     . Dialysate Temperature (C) 37     . BFR-As tolerated to a maximum of: 350 mL/min     . DFR 700 mL/min     . Duration of Treatment 3.5 Hours     . Fluid Removal (L) and Dry Weight (Kg) 2L (Comment: as tol)        Dialysis Treatment Type:    Time out/Safety Check: Time Out/Safety Check Completed: Yes (06/23/13 0840)  [facility] Consent: Consent for HD signed for this hospitalization: Yes (06/23/13 0840)  Blood Consent (if applicable): Blood Consent Verified: Not Applicable ([id number])    Dialysis Access:      Graft/Fistula  Left (Active)   Fistula/ Graft Assessment Abnormalities WDL 06/23/2013 12:31 PM   Needle Size 15 Gauge 06/23/2013 12:31 PM   Cannulation Sites held Arterial (min) 5 06/23/2013 12:31 PM   Cannulation Sites held Venous (min) 5 06/23/2013 12:31 PM   Hemostasis Achieved Yes 06/23/2013 12:31 PM       General Assessments:     06/23/13 0840   Bedside Nurse Communication   Name of bedside RN - pre dialysis Edna   Treatment Initiation- With Dialysis Precautions   Time Out/Safety  Check Completed Yes   Consent for HD signed for this hospitalization Y   Blood Consent Verified N/A   Dialysis Precautions All Connections Secured;Saline Line Double Clamped;Venous Parameters Set;Arterial Parameters Set;Air Foam Detecctor Engaged   Dialysis Treatment Type Routine;Bedside   Is patient diabetic? Yes   RO/Hemodialysis Architectural technologist   Is Total Chlorine less than 0.1 ppm? Yes   Orignial Total Chlorine Testing Time 0745   RO/Hemodialysis Archivist Number 2    RO # 2    Water Hardness 0    pH 7.6    Pressure Test Verified Yes   Alarms Verified Passed   Machine Temperature 98.6 F (37 C)   Alarms Verified Yes   Hemodialysis Conductivity (Machine) 13.7    Hemodialysis Conductivity (Meter) 13.8    Dialyzer Lot Number [id number])   Tubing Lot Number [id number])   RO Machine Log Completed Yes  Hepatitis Status   HBsAg (Antigen) Result Negative   HBsAg Date Drawn 08/31/12   HBsAb (Antibody) Result Reactive  (>150)   Dialysis Weight   Pre-Treatment Weight (Kg) 88.9    Scale Type Floor Scale   Vitals   Temp 97.1 F (36.2 C)   Heart Rate 77    BP 134/82 mmHg   O2 Device None (Room air)   Assessment   Mental Status Alert;Oriented;Cooperative   Cardiac Regularity Regular   Respiratory  WDL   Edema  WDL   General Skin Color Appropriate for ethnicity  (LLE warm to touch, reddened, improved since last tx)   Skin Condition/Temp Warm   Gastrointestinal (WDL) WDL   Mobility Ambulatory   Graft/Fistula  Left   Placement Date: (c)   Access Type: Arteriovenous Fistula  Orientation: Left  Graft/Fistula Location: Upper Arm   Fistula/ Graft Assessment Abnormalities WDL   Needle Size 15 Gauge   Pain Assessment   Charting Type Assessment   Pain Assessment Numeric Scale (0-10)   Pain Score 0   Hemodialysis Comments   Pre-Hemodialysis Comments Tx started without difficulty    Hemodialysis History Information   Outpatient Dialysis Unit [facility]    Outpatient Dialysis Schedule TTS   Outpatient  Nephrologist Ikhinmwin       06/23/13 1231   Treatment Summary   Time Off Machine 1210   Duration of Treatment (Hours) 3.5   Dialyzer Clearance Lightly streaked   Fluid Volume Off (mL) 2600    Prime Volume (mL) 200    Rinseback Volume (mL) 200    Fluid Given: Normal Saline (mL) 200    Fluid Given: PRBC  0 mL   Fluid Given: Albumin (mL) 0    Fluid Given: Other (mL) 250   (Vancomycin)   Total Fluid Given 850    Hemodialysis Net Fluid Removed 1750    Post Treatment Assessment   Post-Treatment Weight (Kg) 87.2    Patient Response to Treatment pt tolerated tx well, a couple episodes of asymptomatic hyptoension, did not increase goal to compensate for vanco admin per bp   Graft/Fistula  Left   Placement Date: (c)   Access Type: Arteriovenous Fistula  Orientation: Left  Graft/Fistula Location: Upper Arm   Fistula/ Graft Assessment Abnormalities WDL   Needle Size 15 Gauge   Cannulation Sites held Arterial (min) 5   Cannulation Sites held Venous (min) 5   Hemostasis Achieved Yes   Vitals   Temp 97.4 F (36.3 C)   Heart Rate 73    BP 134/76 mmHg   Assessment   Mental Status Alert;Oriented;Cooperative   Cardiac Regularity Other (Comment)   Edema  WDL   General Skin Color Appropriate for ethnicity   Skin Condition/Temp Warm   Mobility Ambulatory   Education   Person taught Patient   Knowledge basis None   Topics taught Procedure;Medications   Teaching Tools Explain   Bedside Nurse Communication   Name of bedside RN - pre dialysis Parke Simmers   Name of bedside RN - post dialysis Edna           Pain Assessment: Pain Assessment  Charting Type: Assessment (06/23/13 0840)  Pain Assessment: Numeric Scale (0-10) (06/23/13 0840)  Pain Score: 0-No pain (06/23/13 0840)  POSS Score: Slightly drowsy, easily aroused (06/23/13 0330)  Pain Location: Ankle (06/23/13 0245)  Pain Orientation: Left (06/23/13 0245)  Pain Descriptors: Aching (06/23/13 0245)  Pain Frequency: Intermittent (06/23/13 0245)  Effect of Pain on Daily Activities: moderate  (  06/23/13 0245)  Patient's Stated Comfort Functional Goal: 3-mild pain (06/23/13 0330)  Pain Intervention(s): Medication (See eMAR) (06/23/13 0245)    Labs Values:    HBsAg Result:HBsAg (Antigen) Result: Negative (06/23/13 0840)  HBsAg Date Drawn: HBsAg Date Drawn: 08/31/12 (06/23/13 0840)  HBsAg Next Due Date:     Lab Results   Component Value Date    BUN 49* 06/23/2013    NA 144 06/23/2013    K 4.1 06/23/2013    CREAT 3.8* 06/23/2013    CO2 23 06/23/2013    CA 9.1 06/23/2013    PHOS 5.7* 06/21/2013    GLU 90 06/23/2013    ALB 2.7* 06/21/2013    HGB 9.4* 06/23/2013    HCT 28.1* 06/23/2013    WBC 7.48 06/23/2013    PLT 176 06/23/2013         Current Diet Order  Diet consistent carbohydrate     RO/Hemodialysis Machine Safety Checks - Before Each Treatment:  RO/Hemodialysis Archivist Number: 2  (06/23/13 0840)  RO #: 2  (06/23/13 0840)  Water Hardness: 0  (06/23/13 0840)  pH: 7.6  (06/23/13 0840)  Pressure Test Verified: Yes (06/23/13 0840)  Alarms Verified: Passed (06/23/13 0840)  Machine Temperature: 98.6 F (37 C) (06/23/13 0840)  Alarms Verified: Yes (06/23/13 0840)  Hemodialysis Conductivity (Machine): 13.7  (06/23/13 0840)  Hemodialysis Conductivity (Meter): 13.8  (06/23/13 0840)  RO Machine Log Completed: Yes (06/23/13 0840)    Chlorine Testing:  RO/Hemodialysis Machine Safety Checks  Is Total Chlorine less than 0.1 ppm?: Yes (06/23/13 0840)  Orignial Total Chlorine Testing Time: 0745 (06/23/13 0840)    Vitals and Weight:  Patient Vitals for the past 4 hrs:   BP Temp Pulse   06/23/13 1231 134/76 mmHg 97.4 F (36.3 C) 73    06/23/13 1156 121/62 mmHg - 72    06/23/13 1141 95/59 mmHg - 74    06/23/13 1126 87/64 mmHg - 76    06/23/13 1113 106/59 mmHg - 77    06/23/13 1056 106/65 mmHg - 68    06/23/13 1041 101/59 mmHg - 72    06/23/13 1026 112/59 mmHg - 69    06/23/13 0955 129/65 mmHg - 72    06/23/13 0940 108/57 mmHg - 70    06/23/13 0926 111/59 mmHg - 67    06/23/13 0911 121/58 mmHg - 68    06/23/13 0859 120/55 mmHg  - 65         Dialysis Weight  Pre-Treatment Weight (Kg): 88.9  (06/23/13 0840)  Scale Type: Floor Scale (06/23/13 0840)  Post-Treatment Weight (Kg): 87.2  (06/23/13 1231)    Treatment:            06/23/13 0840 06/23/13 0859 06/23/13 0911   Vitals   Temp 97.1 F (36.2 C) --  --    Heart Rate 77  65  68    BP 134/82 mmHg 120/55 mmHg 121/58 mmHg   O2 Device None (Room air) --  --    Journalist, newspaper Yes --  --    Blood Flow Rate (mL/min) 350 mL/min 350 mL/min 350 mL/min   Arterial Pressure (mmHg) -140 mmHg -147 mmHg -147 mmHg   Venous Pressure (mmHg) 100  107  106    Dialysate Flow Rate (mL/min) 700 mL/min 700 mL/min 700 mL/min   Transmembrane Pressure (mmHg) 90 mmHg 92 mmHg 92 mmHg   Ultrafiltration Rate (mL/Hr) 740 mL/hr 740 mL/hr 740 mL/hr  Fluid Removal (ml) 0 ml 220 ml 381 ml   Dialysate K (mEq/L) 2 mEq/L 2 mEq/L 2 mEq/L   Dialysate CA (mEq/L) 2.5 mEq/L 2.5 mEq/L 2.5 mEq/L   Hemodialysis Comments   Arteriovenous Lines Secure Yes Yes Yes       06/23/13 0926   Vitals   Temp --    Heart Rate 67    BP 111/59 mmHg   O2 Device --    Journalist, newspaper --    Blood Flow Rate (mL/min) 350 mL/min   Arterial Pressure (mmHg) -150 mmHg   Venous Pressure (mmHg) 109    Dialysate Flow Rate (mL/min) 700 mL/min   Transmembrane Pressure (mmHg) 93 mmHg   Ultrafiltration Rate (mL/Hr) 740 mL/hr   Fluid Removal (ml) 563 ml   Dialysate K (mEq/L) 2 mEq/L   Dialysate CA (mEq/L) 2.5 mEq/L   Hemodialysis Comments   Arteriovenous Lines Secure Yes      06/23/13 0940 06/23/13 0955 06/23/13 1026   Vitals   Heart Rate 70  72  69    BP 108/57 mmHg 129/65 mmHg 112/59 mmHg   Machine Metrics   Blood Flow Rate (mL/min) 350 mL/min 350 mL/min 350 mL/min   Arterial Pressure (mmHg) -149 mmHg -148 mmHg -148 mmHg   Venous Pressure (mmHg) 119  121  117    Dialysate Flow Rate (mL/min) 700 mL/min 700 mL/min 700 mL/min   Transmembrane Pressure (mmHg) 94 mmHg 95 mmHg 94 mmHg    Ultrafiltration Rate (mL/Hr) 740 mL/hr 740 mL/hr 740 mL/hr   Fluid Removal (ml) 744 ml 950 ml 1302 ml   Fluid Bolus (ml) 100 ml --  --    Hemodialysis Comments   Arteriovenous Lines Secure Yes Yes Yes   Comments Flushed lines, pt talking on the phone, no complaints  --  needles intact, no complaints       06/23/13 1041   Vitals   Heart Rate 72    BP 101/59 mmHg   Machine Metrics   Blood Flow Rate (mL/min) 350 mL/min   Arterial Pressure (mmHg) -149 mmHg   Venous Pressure (mmHg) 117    Dialysate Flow Rate (mL/min) 700 mL/min   Transmembrane Pressure (mmHg) 93 mmHg   Ultrafiltration Rate (mL/Hr) 740 mL/hr   Fluid Removal (ml) 1484 ml   Fluid Bolus (ml) --    Hemodialysis Comments   Arteriovenous Lines Secure Yes   Comments --       06/23/13 1113   Machine Metrics   Blood Flow Rate (mL/min) 350 mL/min   Arterial Pressure (mmHg) -150 mmHg   Venous Pressure (mmHg) 121    Dialysate Flow Rate (mL/min) 700 mL/min   Transmembrane Pressure (mmHg) 93 mmHg   Ultrafiltration Rate (mL/Hr) 740 mL/hr   Fluid Removal (ml) 1900 ml       Intake/Output:  I/O this shift:  In: 360 [P.O.:360]  Out: 1750 [Other:1750]      Medications:  Current Facility-Administered Medications   Medication Dose Route Last Rate Last Dose   . heparin (porcine) injection 2,000 Units  2,000 Units Intracatheter   2,000 Units at 06/23/13 0838   . sodium chloride 0.9 % bolus 100 mL  100 mL Other       . sodium chloride 0.9 % bolus 250 mL  250 mL Intravenous            Education:  Education  Person taught: Patient (06/23/13 1231)  Knowledge basis: None (06/23/13 1231)  Topics taught:  Procedure;Medications (06/23/13 1231)  Teaching Tools: Explain (06/23/13 1231)    Primary Nurse Communication:  Bedside Nurse Communication  Name of bedside RN - pre dialysis: Parke Simmers (06/23/13 1231)  Name of bedside RN - post dialysis: Edna (06/23/13 1231)      [facility] nurse signature/date/time:Bessye Stith 06/23/13 1240

## 2013-06-23 NOTE — Plan of Care (Signed)
Problem: Pain  Goal: Patient's pain/discomfort is manageable  Outcome: Progressing  Pt reports relief of intermittent pain with PRN Percocet.  Continue to monitor.     Problem: Moderate/High Fall Risk Score >/=15  Goal: Patient will remain free of falls  Outcome: Progressing  Pt remains free from injury, utilizes call light, bed alarm on.

## 2013-06-23 NOTE — Plan of Care (Signed)
Pt discharged home with family after dialysis. Discharged instructions given. Pt verbalized understanding of discharged instructions.

## 2013-06-23 NOTE — Discharge Summary (Signed)
[name]      Patient: Mary [name]  Admission Date: 06/20/2013   DOB: [birth year]  Discharge Date: 06/23/2013    MRN: [id number]  Discharge Attending: Garen Grams MD   Referring Physician: No primary provider on file.  PCP: No primary provider on file.       DISCHARGE SUMMARY     Discharge Information   Admission Diagnosis:   Diabetes [250.00]  Cellulitis of left lower extremity [682.6]  92319  Cellulitis  192319       Discharge Diagnosis:   Patient Active Problem List    Diagnosis Date Noted   . Cellulitis of left leg 06/21/2013   . ESRD (end stage renal disease) 06/21/2013   . DM (diabetes mellitus) 06/21/2013   . Hypertension 06/21/2013   . Cellulitis 06/21/2013        Admission Condition: fair  Discharge Condition: good  Functional Status: Patient is independent with mobility/ambulation, transfers, ADL's, IADL's.    Discharge Medications:     Medication List       As of 06/23/2013  1:34 PM      CONTINUE taking these medications           acetaminophen 500 MG tablet    Commonly known as: TYLENOL        amLODIPine 10 MG tablet    Commonly known as: NORVASC        aspirin 81 MG tablet        atorvastatin 10 MG tablet    Commonly known as: LIPITOR        b complex-vitamin c-folic acid 0.8 MG Tabs        bisacodyl 5 MG EC tablet    Commonly known as: DULCOLAX        esomeprazole 40 MG capsule    Commonly known as: NexIUM        insulin aspart 100 UNIT/ML injection    Commonly known as: NovoLOG        insulin glargine 100 UNIT/ML injection    Commonly known as: LANTUS        pregabalin 75 MG capsule    Commonly known as: LYRICA        traMADol 50 MG tablet    Commonly known as: ULTRAM    Take 1 tablet (50 mg total) by mouth every 6 (six) hours as needed for Pain.                 Hospital Course   Presentation History   [name] presented to the ED due to swelling and redness in her left leg.  She was found to have cellulitis and so was admitted to the hospital.    See H&P for details.    Hospital  Course (3 Days)   1.  Cellulitis of left leg: she was seen by infectious disease who adjusted her antibiotics while here.  She gradually was tapered to vancomycin.  She is gradually improved with respect to swelling and pain in that leg.  She has remained afebrile. She will complete her course of treatment for a total of 10 days with vancomycin after dialysis.  Nephrology is aware.  She will follow with infectious disease in the office early next week to evaluate the progress of the infection.    2. 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  She has not had any problems other  than venous stasis in that leg since then.  She will be seen by her outpatient hematologist for further guidance.  I discussed this with the patient, her family and Dr Desma Paganini.    3. HTN was reasonably controlled during this stay.    4.  DM was reasonably controlled during this stay.    5.  ESRD: she was maintained on RRT via a fistula per her outpatient regimen with her nephrologist.    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 No evidence of     acute DVT.        Susy Manor, MD     06/20/2013 7:19 PM       Treatment Team:   Attending Provider: Toy Baker, MD  Consulting Physician: Chesley Noon, MD  Consulting Physician: Leontine Locket, MD  Consulting Physician: Alric Quan, MD  Consulting Physician: Quintin Alto, Rollene Rotunda, MD       Best Practices   Was the patient admitted with either a CHF Exacerbation or Pneumonia? No     Progress Note/Physical Exam at Discharge     Subjective: [name] reports continuing to feel better.  She is tolerating oral intake.  On dialysis currently and doing well.  Looking forward to going home.  Informed her that her blood cultures were reported as negative.    Filed Vitals:    06/23/13 1126 06/23/13 1141 06/23/13 1156 06/23/13 1231    BP: 87/64 95/59 121/62 134/76   Pulse: 76 74 72 73   Temp:    97.4 F (36.3 C)   TempSrc:       Resp:       Height:       Weight:       SpO2:           General: NAD, Alert and oriented to person, place and time  HEENT: perrla, eomi, sclera anicteric, OP: Clear, oral mucosa moist  Neck: supple, full range of motion, no lymphadenopathy  Cardiovascular: Regular, rate and rhythm, no m/r/g  Lungs: Clear to auscultation bilaterally, no wheeze/rales/rhonchi  Abdomen: soft, +BS, Nontender/Nondistended, no masses  Extremities: no Clubbing/cyanosis/edema  Skin: no rashes or lesions noted  Neurologic: CNII-XII intact, no focal motor or sensory deficits         Diagnostics     Labs/Studies Pending at Discharge: No    Last Labs     Lab 06/23/13 0632 06/22/13 0607 06/21/13 0520   WBC 7.48 7.64 7.69   RBC 3.08* 3.02* 3.11*   HGB 9.4* 9.2* 9.5*   HCT 28.1* 27.7* 28.8*   MCV 91.2 91.7 92.6   PLT 176 160 193         Lab 06/23/13 0632 06/21/13 0922 06/20/13 1746   NA 144 142 142   K 4.1 4.1 4.4   CL 109* 104 102   CO2 23 23 23    BUN 49* 74* 69*   CREAT 3.8* 5.2* 5.8*   GLU 90 229* 188*   CA 9.1 9.1 9.5   MG -- -- --     Microbiology Results     Procedure Component Value Units Date/Time    Blood Culture #1 [id number] Collected:06/20/13 1936    Specimen Information:Blood / Blood Updated:06/22/13 2215    Narrative:    ORDER#: [id number]  ORDERED BY: MEEHAN, PATRICK  SOURCE: Blood Blood                                  COLLECTED:  06/20/13 19:36  ANTIBIOTICS AT COLL.:                                RECEIVED :  06/20/13 21:42  Culture Blood                              PRELIM      06/22/13 22:15  06/21/13   No Growth after 1 day/s of incubation.  06/22/13   No Growth after 2 day/s of incubation.      Blood Culture #2 [id number] Collected:06/20/13 [id number]    Specimen Information:Blood / Blood Updated:06/22/13 2121    Narrative:    ORDER#: [id number]                                    ORDERED BY: Leana Roe,  PATRICK  SOURCE: Blood Blood                                  COLLECTED:  06/20/13 18:04  ANTIBIOTICS AT COLL.:                                RECEIVED :  06/20/13 21:01  Culture Blood                              PRELIM      06/22/13 21:21  06/21/13   No Growth after 1 day/s of incubation.  06/22/13   No Growth after 2 day/s of incubation.               Patient Instructions   Discharge Diet: diabetic diet and renal diet  Discharge Activity:  activity as tolerated    Follow Up Appointment:  Follow-up Information     Follow up with Alric Quan, MD. In 2 days. (dialysis)     Contact information:    [address]  [place] [address]  [phone]          Follow up with Chesley Noon, MD. Call in 5 days.    Contact information:    [place]  [place] [address]  [phone]                 Time spent examining patient, discussing with patient/family regarding hospital course, chart review, reconciling medications and discharge planning: 40 minutes.    Signed,  Garen Grams, MD  1:34 PM 06/23/2013

## 2013-06-27 NOTE — Progress Notes (Signed)
Pt receives hout-pt hemodialysis at [facility] , Tues-Thurs- Sat , first chair @ 0600 per Girard Cooter, Dialysis Liaison w/ [facility].

## 2013-07-11 ENCOUNTER — Emergency Department: Payer: Medicare Other

## 2013-07-11 ENCOUNTER — Emergency Department
Admission: EM | Admit: 2013-07-11 | Discharge: 2013-07-11 | Disposition: A | Discharge status: Home / Self Care | Payer: Medicare Other | Attending: Emergency Medical Services | Admitting: Emergency Medical Services

## 2013-07-11 DIAGNOSIS — G5790 Unspecified mononeuropathy of unspecified lower limb: Secondary | ICD-10-CM | POA: Insufficient documentation

## 2013-07-11 DIAGNOSIS — Z794 Long term (current) use of insulin: Secondary | ICD-10-CM | POA: Insufficient documentation

## 2013-07-11 DIAGNOSIS — Z992 Dependence on renal dialysis: Secondary | ICD-10-CM | POA: Insufficient documentation

## 2013-07-11 DIAGNOSIS — Z7982 Long term (current) use of aspirin: Secondary | ICD-10-CM | POA: Insufficient documentation

## 2013-07-11 DIAGNOSIS — E119 Type 2 diabetes mellitus without complications: Secondary | ICD-10-CM | POA: Insufficient documentation

## 2013-07-11 DIAGNOSIS — N189 Chronic kidney disease, unspecified: Secondary | ICD-10-CM | POA: Insufficient documentation

## 2013-07-11 DIAGNOSIS — E7800 Pure hypercholesterolemia, unspecified: Secondary | ICD-10-CM | POA: Insufficient documentation

## 2013-07-11 DIAGNOSIS — I129 Hypertensive chronic kidney disease with stage 1 through stage 4 chronic kidney disease, or unspecified chronic kidney disease: Secondary | ICD-10-CM | POA: Insufficient documentation

## 2013-07-11 DIAGNOSIS — Z86718 Personal history of other venous thrombosis and embolism: Secondary | ICD-10-CM | POA: Insufficient documentation

## 2013-07-11 MED ORDER — DIPHENHYDRAMINE HCL 25 MG PO CAPS
50.0000 mg | ORAL_CAPSULE | Freq: Once | ORAL | Status: AC
Start: 2013-07-11 — End: 2013-07-11
  Administered 2013-07-11: 50 mg via ORAL
  Filled 2013-07-11: qty 2

## 2013-07-11 MED ORDER — GABAPENTIN 300 MG PO CAPS
300.0000 mg | ORAL_CAPSULE | Freq: Three times a day (TID) | ORAL | Status: DC
Start: 2013-07-11 — End: 2013-07-11
  Filled 2013-07-11 (×4): qty 1

## 2013-07-11 NOTE — ED Provider Notes (Signed)
Physician/Midlevel provider first contact with patient: 07/11/13 0400         EMERGENCY DEPARTMENT HISTORY AND PHYSICAL EXAM    Date: 07/11/2013  Patient Name: Mary [name]  Attending Physician: Alain Honey, MD  Patient DOB:  1946-03-25  MRN:  [id number]    History of Presenting Illness     Chief Complaint:   Chief Complaint   Patient presents with   . Leg Pain       Historian:  Patient  Onset:   Sudden     67 y.o. female h/o DM, high chol, HTN, DVT, neuropathy. Patient is an extremely poor historian, distracting complaints from one body system to the other. When she was asked about her presenting symptoms today, she reports tingling and numbness of the right foot. Associated with subjective facial swelling and itching. Today the patient p/w right foot tingling that she thinks migrated from her left foot and is mainly concerned about the amount of itching on the bottom of her right foot. Patient was recently diagnosed with a DVT in the left leg approximately 24 hours ago and was prescribed Lovenox. Denies any fever, weakness, right leg pain, right foot pain, or any injury.     PMD:  Alric Quan, MD    Past Medical History     Past Medical History   Diagnosis Date   . Diabetes mellitus without complication    . Hyperlipidemia    . Hypertensive disorder    . Chronic kidney disease      dialysis t-tr-sat   . Arthritis    . Cataracts, bilateral    . GERD (gastroesophageal reflux disease)    . Acute deep vein thrombosis (DVT) of distal vein of left lower extremity        Past Surgical History     Past Surgical History   Procedure Date   . Av fistula placement 2012       Family History     History reviewed. No pertinent family history.    Social History     History     Social History   . Marital Status: Widowed     Spouse Name: N/A     Number of Children: N/A   . Years of Education: N/A     Social History Main Topics   . Smoking status: Never Smoker    . Smokeless tobacco: Not on file   . Alcohol Use: No   .  Drug Use: No   . Sexually Active:      Other Topics Concern   . Not on file     Social History Narrative   . No narrative on file       Allergies     Allergies   Allergen Reactions   . Penicillins Hives   . Shrimp (Shellfish Allergy) Swelling       Home Medications     Home medications reviewed by ED MD at 4:01 AM     Previous Medications    ACETAMINOPHEN (TYLENOL) 500 MG TABLET    Take 500 mg by mouth every 6 (six) hours as needed.      AMLODIPINE (NORVASC) 10 MG TABLET    Take 10 mg by mouth daily.      ASPIRIN 81 MG TABLET    Take 81 mg by mouth daily.      ATORVASTATIN (LIPITOR) 10 MG TABLET    Take 10 mg by mouth daily.      B COMPLEX-VITAMIN C-FOLIC  ACID (NEPHRO-VITE) 0.8 MG TABS    Take 0.8 mg by mouth.    BISACODYL (DULCOLAX) 5 MG EC TABLET    Take 5 mg by mouth daily as needed.      ESOMEPRAZOLE (NEXIUM) 40 MG CAPSULE    Take 40 mg by mouth every morning before breakfast.      INSULIN ASPART (NOVOLOG) 100 UNIT/ML INJECTION    Inject 1-10 Units into the skin 3 (three) times daily before meals.      INSULIN GLARGINE (LANTUS) 100 UNIT/ML INJECTION    Inject 10-20 Units into the skin nightly.      PREGABALIN (LYRICA) 75 MG CAPSULE    Take 75 mg by mouth 2 (two) times daily.      TRAMADOL (ULTRAM) 50 MG TABLET    Take 1 tablet (50 mg total) by mouth every 6 (six) hours as needed for Pain.       Review of Systems     Review of Systems   Constitutional: Negative for fever.   HENT:        Positive for subjective facial swelling   Musculoskeletal:        Negative for right leg pain or right foot pain    Skin: Positive for itching (face, right foot).   Neurological: Positive for tingling (right foot). Negative for weakness.        Positive for right foot numbness   All other systems reviewed and are negative.         Physical Exam     CONSTITUTIONAL  Patient is afebrile, Vital signs reviewed.  HEAD  Atraumatic, Normocephallc.  EYES   Eyes are normal to inspection, PERRL, No discharge from eyes,  ENT  Ears normal to  inspection, Nose examination normal, Posterior pharynx normal.  NECK   Normal ROM, No jugular venous distention, No meningeal signs.  RESPIRATORY CHEST   Chest is nontender, Breath sounds normal.  CARDIOVASCULAR   RRR, Heart sounds normal, Normal S1 S2.  ABDOMEN  Abdomen is nontender, No pulsatile masses, No other masses,  Bowel sounds normal, No distension, No peritoneal signs.  BACK  There is no CVA Tenderness, There is no tenderness to palpation.   UPPER EXTREMITY  Inspection normal, No cyanosis.   LOWER EXTREMITY  Left leg is slightly more swollen compared to the right and slightly warmer than the right leg with calf tenderness, No lesions or rash on the right foot, Sensation is intact, No cyanosis.   NEURO  GCS Is 15, No focal motor deficits, No focal sensory deficits  SKIN   Skin is warm, Skin is dry, Skin is normal color  LYMPHATIC   No adenopathy in neck.  PSYCHIATRIC Oriented X 3, Normal affect. Normal insight.    ED Medications Administered     ED Medication Orders      Start     Status Ordering Provider    07/11/13 0600   gabapentin (NEURONTIN) capsule 300 mg   Every 8 hours scheduled      Route: Oral  Ordered Dose: 300 mg         Last MAR action:  Not Given Phillp Dolores A    07/11/13 0459   diphenhydrAMINE (BENADRYL) capsule 50 mg   Once      Route: Oral  Ordered Dose: 50 mg         Last MAR action:  Given Lorenza Shakir A  Orders Placed During This Encounter     No orders of the defined types were placed in this encounter.       Data Review     Nursing records reviewed and agree: Yes    Pulse Oximetry Analysis - Normal    I, Alain Honey, MD, personally performed the services documented.  Jinho Heath Lark is scribing for me on [employer]. I reviewed and confirm the accuracy of the information in this medical record.     I, Toribio Harbour, am serving as a Education administrator to document services personally performed by Alain Honey, MD, based on the provider's statements to me.     Rendering  Provider: Alain Honey, MD    Diagnostic Study Results     Labs     Results     ** No Results found for the last 24 hours. **          Radiologic Studies  Radiology Results (24 Hour)     ** No Results found for the last 24 hours. **      .    VS     Patient Vitals for the past 24 hrs:   BP Temp Pulse Resp SpO2 Weight   07/11/13 0502 126/62 mmHg - 81  18  95 % -   07/11/13 0246 112/51 mmHg 96.9 F (36.1 C) 78  20  100 % 78.4 kg       MDM and Clinical Notes       Diagnosis and Disposition     Clinical Impression  1. Neuropathy        Disposition  ED Disposition     Discharge Mary [name] discharge to home/self care.    Condition at discharge: Stable            Prescriptions  New Prescriptions    No medications on file           Alain Honey, MD  07/11/13 [phone]

## 2013-07-11 NOTE — Discharge Instructions (Signed)
Dear Ms. Riley Nearing:    I appreciate your choosing the [facility] for your healthcare needs, and hope your visit today was EXCELLENT.    Instructions:  Please follow-up with Dr. Leonette Most, your primary care doctor, today as scheduled.       Below is some information that our patients often find helpful.    We wish you good health and please do not hesitate to contact us if we can ever be of any assistance.    Sincerely,  Alain Honey, MD  [name]    ________________________________________________________________    If you do not continue to improve or your condition worsens, please contact your doctor or return immediately to the Emergency Department.    Thank you for choosing [facility] for your emergency care needs.  We strive to provide EXCELLENT care to you and your family.      DOCTOR REFERRALS  Call [phone] if you need any further referrals and we can help you find a primary care doctor or specialist.  Also, available online at:  [email or web address]    YOUR CONTACT INFORMATION  Before leaving please check with registration to make sure we have an up-to-date contact number.  You can call registration at [phone] to update your information.  For questions about your hospital bill, please call [phone].  For questions about your Emergency Dept Physician bill please call [phone].      [place]  If you need help with health or social services, please call 2-1-1 for a free referral to resources in your area.  2-1-1 is a free service connecting people with information on health insurance, free clinics, pregnancy, mental health, dental care, food assistance, housing, and substance abuse counseling.  Also, available online at:  http://[email or web address].org    MEDICAL RECORDS AND TESTS  Certain laboratory test results do not come back the same day, for example urine cultures.   We will contact  you if other important findings are noted.  Radiology films are often reviewed again to ensure accuracy.  If there is any discrepancy, we will notify you.      Please call [phone] to pick up a complimentary CD of any radiology studies performed.  If you or your doctor would like to request a copy of your medical records, please call [phone].      ORTHOPEDIC INJURY   Please know that significant injuries can exist even when an initial x-ray is read as normal or negative.  This can occur because some fractures (broken bones) are not initially visible on x-rays.  For this reason, close outpatient follow-up with your primary care doctor or bone specialist (orthopedist) is required.    MEDICATIONS AND FOLLOWUP  Please be aware that some prescription medications can cause drowsiness.  Use caution when driving or operating machinery.    The examination and treatment you have received in our Emergency Department is provided on an emergency basis, and is not intended to be a substitute for your primary care physician.  It is important that your doctor checks you again and that you report any new or remaining problems at that time.      [place], [place], [place] [address] (1.4 miles, 7 minutes)  [place], [place], [place] [address] (6.5 miles, 13 minutes)  Handout with directions available on request

## 2013-07-11 NOTE — ED Notes (Addendum)
Was seen in [facility] ER yesterday for LLE pain, dx with DVT, now c/o RLE pain and swelling, started Lovenox injections

## 2013-07-19 ENCOUNTER — Inpatient Hospital Stay
Admission: EM | Admit: 2013-07-19 | Discharge: 2013-07-21 | DRG: 313 | Disposition: A | Discharge status: Home / Self Care | Payer: Medicare Other | Attending: Internal Medicine | Admitting: Internal Medicine

## 2013-07-19 ENCOUNTER — Emergency Department: Payer: Medicare Other

## 2013-07-19 ENCOUNTER — Inpatient Hospital Stay: Payer: Medicare Other | Admitting: Internal Medicine

## 2013-07-19 DIAGNOSIS — Z794 Long term (current) use of insulin: Secondary | ICD-10-CM

## 2013-07-19 DIAGNOSIS — I825Y9 Chronic embolism and thrombosis of unspecified deep veins of unspecified proximal lower extremity: Secondary | ICD-10-CM | POA: Diagnosis present

## 2013-07-19 DIAGNOSIS — E119 Type 2 diabetes mellitus without complications: Secondary | ICD-10-CM | POA: Diagnosis present

## 2013-07-19 DIAGNOSIS — I319 Disease of pericardium, unspecified: Secondary | ICD-10-CM | POA: Diagnosis present

## 2013-07-19 DIAGNOSIS — G609 Hereditary and idiopathic neuropathy, unspecified: Secondary | ICD-10-CM | POA: Diagnosis present

## 2013-07-19 DIAGNOSIS — Z880 Allergy status to penicillin: Secondary | ICD-10-CM

## 2013-07-19 DIAGNOSIS — I120 Hypertensive chronic kidney disease with stage 5 chronic kidney disease or end stage renal disease: Secondary | ICD-10-CM | POA: Diagnosis present

## 2013-07-19 DIAGNOSIS — E785 Hyperlipidemia, unspecified: Secondary | ICD-10-CM | POA: Diagnosis present

## 2013-07-19 DIAGNOSIS — Z7901 Long term (current) use of anticoagulants: Secondary | ICD-10-CM

## 2013-07-19 DIAGNOSIS — Z91013 Allergy to seafood: Secondary | ICD-10-CM

## 2013-07-19 DIAGNOSIS — N186 End stage renal disease: Secondary | ICD-10-CM | POA: Diagnosis present

## 2013-07-19 DIAGNOSIS — M129 Arthropathy, unspecified: Secondary | ICD-10-CM | POA: Diagnosis present

## 2013-07-19 DIAGNOSIS — K219 Gastro-esophageal reflux disease without esophagitis: Secondary | ICD-10-CM | POA: Diagnosis present

## 2013-07-19 DIAGNOSIS — Z992 Dependence on renal dialysis: Secondary | ICD-10-CM

## 2013-07-19 DIAGNOSIS — R079 Chest pain, unspecified: Principal | ICD-10-CM | POA: Diagnosis present

## 2013-07-19 DIAGNOSIS — I498 Other specified cardiac arrhythmias: Secondary | ICD-10-CM | POA: Diagnosis present

## 2013-07-19 LAB — CK
Creatine Kinase (CK): 124 U/L (ref 29–168)
Creatine Kinase (CK): 89 U/L (ref 29–168)

## 2013-07-19 LAB — POCT GLUCOSE: Whole Blood Glucose POCT: 310 mg/dL — AB (ref 70–100)

## 2013-07-19 LAB — CBC AND DIFFERENTIAL
Basophils Absolute Automated: 0.03 (ref 0.00–0.20)
Basophils Automated: 0 %
Eosinophils Absolute Automated: 0.05 (ref 0.00–0.70)
Eosinophils Automated: 0 %
Hematocrit: 34.7 % — ABNORMAL LOW (ref 37.0–47.0)
Hgb: 11.7 g/dL — ABNORMAL LOW (ref 12.0–16.0)
Immature Granulocytes Absolute: 0.03
Immature Granulocytes: 0 %
Lymphocytes Absolute Automated: 1.91 (ref 0.50–4.40)
Lymphocytes Automated: 18 %
MCH: 31 pg (ref 28.0–32.0)
MCHC: 33.7 g/dL (ref 32.0–36.0)
MCV: 91.8 fL (ref 80.0–100.0)
MPV: 12.1 fL (ref 9.4–12.3)
Monocytes Absolute Automated: 0.77 (ref 0.00–1.20)
Monocytes: 7 %
Neutrophils Absolute: 7.78 (ref 1.80–8.10)
Neutrophils: 74 %
Nucleated RBC: 0 (ref 0–1)
Platelets: 158 (ref 140–400)
RBC: 3.78 — ABNORMAL LOW (ref 4.20–5.40)
RDW: 14 % (ref 12–15)
WBC: 10.54 (ref 3.50–10.80)

## 2013-07-19 LAB — BASIC METABOLIC PANEL
Anion Gap: 14 (ref 5.0–15.0)
BUN: 20 mg/dL — ABNORMAL HIGH (ref 7–19)
CO2: 26 (ref 22–29)
Calcium: 10 mg/dL (ref 8.5–10.5)
Chloride: 93 — ABNORMAL LOW (ref 98–107)
Creatinine: 3.3 mg/dL — ABNORMAL HIGH (ref 0.6–1.0)
Glucose: 187 mg/dL — ABNORMAL HIGH (ref 70–100)
Potassium: 4.4 (ref 3.5–5.1)
Sodium: 133 — ABNORMAL LOW (ref 136–145)

## 2013-07-19 LAB — TROPONIN I
Troponin I: 0.02 ng/mL (ref 0.00–0.09)
Troponin I: 0.02 ng/mL (ref 0.00–0.09)

## 2013-07-19 LAB — PT AND APTT
PT INR: 1.8 — ABNORMAL HIGH (ref 0.9–1.1)
PT: 20.8 — ABNORMAL HIGH (ref 12.6–15.0)
PTT: 43 — ABNORMAL HIGH (ref 23–37)

## 2013-07-19 LAB — GFR: EGFR: 16.8

## 2013-07-19 MED ORDER — ONDANSETRON HCL 4 MG/2ML IJ SOLN
4.0000 mg | Freq: Once | INTRAMUSCULAR | Status: AC
Start: 2013-07-19 — End: 2013-07-19
  Administered 2013-07-19: 4 mg via INTRAVENOUS
  Filled 2013-07-19: qty 2

## 2013-07-19 MED ORDER — FAMOTIDINE 10 MG/ML IV SOLN (WRAP)
20.0000 mg | Freq: Once | INTRAVENOUS | Status: AC
Start: 2013-07-19 — End: 2013-07-19
  Administered 2013-07-19: 20 mg via INTRAVENOUS
  Filled 2013-07-19: qty 2

## 2013-07-19 MED ORDER — GLUCAGON HCL (RDNA) 1 MG IJ SOLR
1.00 mg | INTRAMUSCULAR | Status: DC | PRN
Start: 2013-07-19 — End: 2013-07-21

## 2013-07-19 MED ORDER — DEXTROSE 50 % IV SOLN
25.0000 mL | INTRAVENOUS | Status: DC | PRN
Start: 2013-07-19 — End: 2013-07-21

## 2013-07-19 MED ORDER — PROMETHAZINE HCL 25 MG/ML IJ SOLN
12.5000 mg | Freq: Four times a day (QID) | INTRAMUSCULAR | Status: DC | PRN
Start: 2013-07-19 — End: 2013-07-21

## 2013-07-19 MED ORDER — CYCLOBENZAPRINE HCL 10 MG PO TABS
10.00 mg | ORAL_TABLET | Freq: Three times a day (TID) | ORAL | Status: DC
Start: 2013-07-19 — End: 2013-07-20
  Administered 2013-07-19: 10 mg via ORAL
  Filled 2013-07-19 (×2): qty 1

## 2013-07-19 MED ORDER — GABAPENTIN 300 MG PO CAPS
300.0000 mg | ORAL_CAPSULE | Freq: Three times a day (TID) | ORAL | Status: DC
Start: 2013-07-19 — End: 2013-07-20
  Administered 2013-07-19 – 2013-07-20 (×2): 300 mg via ORAL
  Filled 2013-07-19 (×2): qty 1

## 2013-07-19 MED ORDER — PREGABALIN 75 MG PO CAPS
75.0000 mg | ORAL_CAPSULE | Freq: Two times a day (BID) | ORAL | Status: DC
Start: 2013-07-19 — End: 2013-07-20
  Administered 2013-07-19 – 2013-07-20 (×2): 75 mg via ORAL
  Filled 2013-07-19 (×2): qty 1

## 2013-07-19 MED ORDER — PANTOPRAZOLE SODIUM 40 MG IV SOLR
40.0000 mg | Freq: Every day | INTRAVENOUS | Status: DC
Start: 2013-07-19 — End: 2013-07-19
  Administered 2013-07-19: 40 mg via INTRAVENOUS
  Filled 2013-07-19: qty 40

## 2013-07-19 MED ORDER — ATORVASTATIN CALCIUM 20 MG PO TABS
10.0000 mg | ORAL_TABLET | Freq: Every evening | ORAL | Status: DC
Start: 2013-07-19 — End: 2013-07-21
  Administered 2013-07-19 – 2013-07-20 (×2): 10 mg via ORAL
  Filled 2013-07-19 (×2): qty 1

## 2013-07-19 MED ORDER — GLUCOSE 40 % PO GEL
15.0000 g | ORAL | Status: DC | PRN
Start: 2013-07-19 — End: 2013-07-21

## 2013-07-19 MED ORDER — NITROGLYCERIN 0.4 MG SL SUBL
0.40 mg | SUBLINGUAL_TABLET | SUBLINGUAL | Status: DC | PRN
Start: 2013-07-19 — End: 2013-07-21
  Administered 2013-07-19: 0.4 mg via SUBLINGUAL

## 2013-07-19 MED ORDER — HYDROMORPHONE HCL PF 1 MG/ML IJ SOLN
0.2000 mg | INTRAMUSCULAR | Status: DC | PRN
Start: 2013-07-19 — End: 2013-07-20

## 2013-07-19 MED ORDER — HYDROMORPHONE HCL PF 1 MG/ML IJ SOLN
0.5000 mg | INTRAMUSCULAR | Status: DC | PRN
Start: 2013-07-19 — End: 2013-07-19
  Administered 2013-07-19: 0.5 mg via INTRAVENOUS
  Filled 2013-07-19: qty 1

## 2013-07-19 MED ORDER — WARFARIN SODIUM 5 MG PO TABS
5.0000 mg | ORAL_TABLET | Freq: Every day | ORAL | Status: DC
Start: 2013-07-19 — End: 2013-07-21
  Administered 2013-07-19 – 2013-07-21 (×3): 5 mg via ORAL
  Filled 2013-07-19 (×3): qty 1

## 2013-07-19 MED ORDER — BISACODYL 10 MG RE SUPP
10.0000 mg | Freq: Every day | RECTAL | Status: DC | PRN
Start: 2013-07-19 — End: 2013-07-21

## 2013-07-19 MED ORDER — LACTULOSE 10 GM/15ML PO SOLN
20.0000 g | Freq: Three times a day (TID) | ORAL | Status: DC
Start: 2013-07-19 — End: 2013-07-21
  Administered 2013-07-19 – 2013-07-21 (×6): 20 g via ORAL
  Filled 2013-07-19 (×6): qty 30

## 2013-07-19 MED ORDER — MORPHINE SULFATE 2 MG/ML IJ/IV SOLN (WRAP)
2.0000 mg | Status: DC | PRN
Start: 2013-07-19 — End: 2013-07-19

## 2013-07-19 MED ORDER — HYDROMORPHONE HCL PF 1 MG/ML IJ SOLN
0.5000 mg | Freq: Once | INTRAMUSCULAR | Status: AC
Start: 2013-07-19 — End: 2013-07-19
  Administered 2013-07-19: 0.5 mg via INTRAVENOUS
  Filled 2013-07-19: qty 1

## 2013-07-19 MED ORDER — ASPIRIN 81 MG PO CHEW
81.00 mg | CHEWABLE_TABLET | Freq: Every day | ORAL | Status: DC
Start: 2013-07-19 — End: 2013-07-20
  Administered 2013-07-19 – 2013-07-20 (×2): 81 mg via ORAL
  Filled 2013-07-19 (×3): qty 1

## 2013-07-19 MED ORDER — IODIXANOL 320 MG/ML IV SOLN
100.00 mL | Freq: Once | INTRAVENOUS | Status: AC | PRN
Start: 2013-07-19 — End: 2013-07-19
  Administered 2013-07-19: 100 mL via INTRAVENOUS

## 2013-07-19 MED ORDER — NITROGLYCERIN 0.4 MG SL SUBL
0.4000 mg | SUBLINGUAL_TABLET | SUBLINGUAL | Status: AC | PRN
Start: 2013-07-19 — End: 2013-07-19
  Administered 2013-07-19 (×3): 0.4 mg via SUBLINGUAL
  Filled 2013-07-19 (×2): qty 1

## 2013-07-19 MED ORDER — SIMETHICONE 80 MG PO CHEW
80.0000 mg | CHEWABLE_TABLET | Freq: Two times a day (BID) | ORAL | Status: DC
Start: 2013-07-19 — End: 2013-07-21
  Administered 2013-07-19 – 2013-07-21 (×4): 80 mg via ORAL
  Filled 2013-07-19 (×5): qty 1

## 2013-07-19 MED ORDER — NITROGLYCERIN 0.4 MG SL SUBL
0.4000 mg | SUBLINGUAL_TABLET | SUBLINGUAL | Status: DC | PRN
Start: 2013-07-19 — End: 2013-07-21

## 2013-07-19 MED ORDER — ACETAMINOPHEN 500 MG PO TABS
500.0000 mg | ORAL_TABLET | Freq: Four times a day (QID) | ORAL | Status: DC | PRN
Start: 2013-07-19 — End: 2013-07-21

## 2013-07-19 MED ORDER — DIPHENHYDRAMINE HCL 25 MG PO CAPS
25.0000 mg | ORAL_CAPSULE | Freq: Every day | ORAL | Status: DC | PRN
Start: 2013-07-19 — End: 2013-07-21

## 2013-07-19 MED ORDER — PANTOPRAZOLE SODIUM 40 MG PO TBEC
40.0000 mg | DELAYED_RELEASE_TABLET | Freq: Every morning | ORAL | Status: DC
Start: 2013-07-20 — End: 2013-07-21
  Administered 2013-07-20 – 2013-07-21 (×2): 40 mg via ORAL
  Filled 2013-07-19 (×2): qty 1

## 2013-07-19 MED ORDER — INSULIN GLARGINE 100 UNIT/ML SC SOLN
4.0000 [IU] | Freq: Every evening | SUBCUTANEOUS | Status: DC
Start: 2013-07-19 — End: 2013-07-21
  Administered 2013-07-19 – 2013-07-20 (×2): 4 [IU] via SUBCUTANEOUS

## 2013-07-19 MED ORDER — INSULIN ASPART 100 UNIT/ML SC SOLN
1.0000 [IU] | Freq: Three times a day (TID) | SUBCUTANEOUS | Status: DC | PRN
Start: 2013-07-19 — End: 2013-07-21
  Administered 2013-07-19: 2 [IU] via SUBCUTANEOUS
  Administered 2013-07-20: 3 [IU] via SUBCUTANEOUS
  Administered 2013-07-20: 2 [IU] via SUBCUTANEOUS
  Administered 2013-07-20: 5 [IU] via SUBCUTANEOUS
  Administered 2013-07-20: 1 [IU] via SUBCUTANEOUS
  Administered 2013-07-21: 2 [IU] via SUBCUTANEOUS
  Administered 2013-07-21: 4 [IU] via SUBCUTANEOUS
  Filled 2013-07-19: qty 20

## 2013-07-19 NOTE — H&P (Addendum)
[facility]      Patient: Mary [name]  Date: 07/19/2013   DOB: [birth year]  Admission Date: 07/19/2013   MRN: [id number]  Attending: Merrilee Jansky MD       Chief Complaint   Patient presents with   . Chest Pain      History Gathered From: Self, Adult Child and EMS/ED notes.    HISTORY AND PHYSICAL     Mary [name] is a 67 y.o. female with a PMHx of ESRD, IDDM, recent hospitalization for left lower extremity clot on coumadin who presented localized substernal chest pain at rest last night.  Pain also occurred during HD.  Chest pain worse with laying flat after eating pinto bean meal, non spicy, non fatty meal. +belching, 10/10 crampy, dull, hurts with movement on rotation to right and raising legs.  Patient states pain decreased to 8/10 after Dilaudid 0.5mg  iv which is tolerable for her. Denies nausea, +constipated (may have not had a BM in several days). Denied fevers or chills.             Pain mmildly relieved with nitro SL x3.  More relief with Dilaudid 0.5 mig iv but this causes sedation in patient.  ER also ordered CT chest with IV contrast which is negative for PE.       Past Medical History   Diagnosis Date   . Diabetes mellitus without complication    . Hyperlipidemia    . Hypertensive disorder    . Chronic kidney disease      dialysis t-tr-sat   . Arthritis    . Cataracts, bilateral    . GERD (gastroesophageal reflux disease)    . Acute deep vein thrombosis (DVT) of distal vein of left lower extremity    . Hemodialysis access site with mature fistula        Past Surgical History   Procedure Date   . Av fistula placement 2012         Home medications:   Benadryl 25 mg daily prn neuropthy  Gabapentin 300 mg po q8hrs   Amlodipine 10 mg daily  Flexeril 10mg  tid  Percocet 5-325 mg 1-2 tabs every 6hours prn   Atorvastatin  10 mg daily  Warfarin 5mg  daily at 5pm  Nexium 30mg  po daily  lantus 4 - 5 qhs  novolog 5-6 units bid prn blood sugars  Asa 81 daily  Nephrocaps 1 tab daily  Dulcolax 5mg   qhs  Lyrica 75 mg bid  Tylenol 500mg  1 tab q6hrs prn pain      Allergies   Allergen Reactions   . Penicillins Hives   . Shrimp (Shellfish Allergy) Swelling       CODE STATUS: full code  Will need to be further reviewed with patient.  Daughters have Advance directive at home and will bring in tomorrow.  POA: Alvera Singh     PRIMARY CARE MD: Alric Quan, MD    History reviewed. No pertinent family history.    History   Substance Use Topics   . Smoking status: Never Smoker    . Smokeless tobacco: Not on file   . Alcohol Use: No       REVIEW OF SYSTEMS     Ten point review of systems negative or as per HPI and below endorsements.    PHYSICAL EXAM     Vital Signs (most recent): BP 142/70  Pulse 105  Temp 98.4 F (36.9 C)  Resp 20  Ht  1.6 m (5\' 3" )  Wt 85.73 kg (189 lb)  BMI 33.49 kg/m2  SpO2 99%  Constiutional: Appears stated age.  Lethargy after dilaudid iv    HEENT: alopecia, NC/AT, PERRL, no scleral icterus or conjunctival pallor, no nasal discharge, MMM, oropharynx without erythema or exudate  Neck: trachea midline, supple, no cervical or supraclavicular lymphadenopathy or masses  Cardiovascular: RRR, normal S1 S2, no murmurs, gallops, palpable thrills, no JVD, Non-displaced PMI.  Respiratory: Normal rate. No retractions or increased work of breathing. Clear to auscultation and percussion bilaterally.  Gastrointestinal: Diminished BS, mild distension, mild diffuse tenderness, no rebound or guarding, no hepatosplenomegaly  Genitourinary: no suprapubic or costovertebral angle tenderness  Musculoskeletal: ROM and motor strength grossly normal. No clubbing, edema, or cyanosis. DP and radial pulses 2+ and symmetric.  Skin: indurated left lower extremity (Blood clot)  Neurologic: EOMI, CN 2-12 grossly intact. no gross motor or sensory deficits, patellar and bicep DTR 2+ and symmetric, downward plantar reflexes      LABS & IMAGING     Recent Results (from the past 24 hour(s))   BASIC METABOLIC PANEL     Collection Time    07/19/13  1:03 PM       Component Value Range    Glucose 187 (*) 70 - 100 mg/dL    BUN 20 (*) 7 - 19 mg/dL    Creatinine 3.3 (*) 0.6 - 1.0 mg/dL    CALCIUM 10.0  8.5 - 10.5 mg/dL    Sodium 133 (*) 136 - 145    Potassium 4.4  3.5 - 5.1    Chloride 93 (*) 98 - 107    CO2 26  22 - 29    Anion Gap 14.0  5.0 - 15.0   CK    Collection Time    07/19/13  1:03 PM       Component Value Range    Creatine Kinase (CK) 124  29 - 168 U/L   GFR    Collection Time    07/19/13  1:03 PM       Component Value Range    EGFR 16.8     CBC AND DIFFERENTIAL    Collection Time    07/19/13  1:04 PM       Component Value Range    WBC 10.54  3.50 - 10.80    RBC 3.78 (*) 4.20 - 5.40    Hgb 11.7 (*) 12.0 - 16.0 g/dL    Hematocrit 34.7 (*) 37.0 - 47.0 %    MCV 91.8  80.0 - 100.0 fL    MCH 31.0  28.0 - 32.0 pg    MCHC 33.7  32.0 - 36.0 g/dL    RDW 14  12 - 15 %    Platelets 158  140 - 400    MPV 12.1  9.4 - 12.3 fL    Neutrophils 74  None %    Lymphocytes Automated 18  None %    Monocytes 7  None %    Eosinophils Automated 0  None %    Basophils Automated 0  None %    Immature Granulocyte 0  None %    Nucleated RBC 0  0 - 1    Neutrophils Absolute 7.78  1.80 - 8.10    Abs Lymph Automated 1.91  0.50 - 4.40    Abs Mono Automated 0.77  0.00 - 1.20    Abs Eos Automated 0.05  0.00 - 0.70    Absolute Baso Automated  0.03  0.00 - 0.20    Absolute Immature Granulocyte 0.03  0   TROPONIN I    Collection Time    07/19/13  1:04 PM       Component Value Range    Troponin I 0.02  0.00 - 0.09 ng/mL   PT AND APTT    Collection Time    07/19/13  2:23 PM       Component Value Range    PT 20.8 (*) 12.6 - 15.0    PT INR 1.8 (*) 0.9 - 1.1    PT Anticoag. Given Within 48 hrs. Unknown      PTT 43 (*) 23 - 37   TROPONIN I    Collection Time    07/19/13  6:17 PM       Component Value Range    Troponin I 0.02  0.00 - 0.09 ng/mL   CK    Collection Time    07/19/13  6:17 PM       Component Value Range    Creatine Kinase (CK) 89  29 - 168 U/L         IMAGING:  XR  CHEST AP PORTABLE    Final Result:  Hypoinflation with minimal basilar atelectasis.         Sheria Lang, MD     07/19/2013 2:45 PM   CT ANGIOGRAM CHEST    Final Result:      1. No CT scan evidence for detectable pulmonary embolism.    2. Minimal cardiomegaly.    3. Minimal bibasilar lung atelectasis.    4. Remainder as above.        Sheria Lang, MD     07/19/2013 3:18 PM       CARDIAC:  EKG Interpretation:  sinus rhythm, LVH.    Markers:    Lab 07/19/13 1817 07/19/13 1304 07/19/13 1303   CK 89 -- 124   CKMB -- -- --   CKMBINDEX -- -- --   TROPI 0.02 0.02 --       EMERGENCY DEPARTMENT COURSE:  Orders Placed This Encounter   Procedures   . XR Chest AP Portable   . CT Angio Chest   . CBC and differential   . Basic Metabolic Panel   . Creatine Kinase (CK)   . Troponin I   . GFR   . PT/APTT   . Troponin I   . Creatine Kinase (CK)   . Diet consistent carbohydrate   . May be transported off monitor   . ED Holding Orders Expire in 8 Hours   . Notify Admitting Attending ( Change in Condition)   . Notify Attending of Patient Arrival to Floor within 8 Hours   . Notify Physician (Vital Signs)   . Notify Physician (Lab Results)   . Vital Signs Q4HR   . ED Unit Sec Comm Order   . ECG 12 Lead   . ECG 12 lead   . ECG 12 lead   . Saline lock IV   . [name] ED Bed Request       ASSESSMENT & PLAN     Mary [name] is a 67 y.o. female admitted under OBSERVATION with nonspecific chest pain.    1. Chest pain/Abdominal pain: Given risk factors will continue to follow Cardiac enzymes and daily ekg.  If positive for ischemia/injury will contact cardiology.  This could also be due GI due to constipation. Check CT abdomen.  Start Lactulose tid, with  dulcolax prn. Decrease Dilaudid to 0.2 mg iv due to sedation.  Ad simethicone. Will order clear liquid diet for now, but npo for ct scan. advance as tolerated.     2. HTN: norvasc.      3. IDDM: start lantus subqhs 4 units, Novolog ss, check ac,qhs bs    4. Chronic neuropathy: resume Lyrica,  Gabapentin, flexeril    5. Left lower ext dvt: continue coumadin      6. ESRD: ON HMD Nephro consulted.   Marland Kitchen  GI Prophylaxis  continue PPI         DVT/VTE Prophylaxis: on coumadin      Signed,  Merrilee Jansky, MD  07/19/2013 7:40 PM    ** Notified around 3 am by Radiologist regarding patient having a new pericardial effusion on CT abdomen: ordered echo and cardiology consult.

## 2013-07-19 NOTE — ED Notes (Addendum)
Mid sternal chest "pressure" "gas" since last night.  Pt just finished hemodialysis today.  Pt in tears because of pain when she attempted to climb onto stretcher.  Movement exacerbates pain.

## 2013-07-19 NOTE — Consults (Signed)
[facility]   Consultation    Date Time: 07/19/2013 5:59 PM  Patient Name: Mary [name], Mary [name]  Medical Record Number: [id number]   Primary Care Physician: @[email or web address] @   Requesting Physician: Laroy Apple, MD    Reason for Consultation:   ESRD on hemodialysis      Assessment:   Chest pain  ESRD  DM  Plan:   Hemodialysis Tue, Thurs, Sat    We will follow this patient closely with you. Thank you for allowing [place] to participate in the care of this patient.    Alric Quan, MD  Office - [phone]      History:   MARGHERITA HOSP is a 67 y.o. female who presents to the hospital on 07/19/2013 with PMH notable for DM, HTN, ESRD on hemodialysis, who is admitted because of chest pain. Renal consult requested for management of kidney failure. Pt had hemodialysis today. Next scheduled hemodialysis is on Thursday. Patient states that pain worsened during hemodialysis today, had partial relief with oxygen.    Past Medical and Surgical  History:     Past Medical History   Diagnosis Date   . Diabetes mellitus without complication    . Hyperlipidemia    . Hypertensive disorder    . Chronic kidney disease      dialysis t-tr-sat   . Arthritis    . Cataracts, bilateral    . GERD (gastroesophageal reflux disease)    . Acute deep vein thrombosis (DVT) of distal vein of left lower extremity    . Hemodialysis access site with mature fistula        Family History:     Social History:   No tobacco  No EtOH  No IVDA  Married  Allergies:     Allergies   Allergen Reactions   . Penicillins Hives   . Shrimp (Shellfish Allergy) Swelling     Medications:     Current Facility-Administered Medications   Medication Dose Route Frequency   . [COMPLETED] famotidine  20 mg Intravenous Once   . [COMPLETED] HYDROmorphone  0.5 mg Intravenous Once   . [COMPLETED] ondansetron  4 mg Intravenous Once   . pantoprazole  40 mg Intravenous Daily     Review of Systems:   12 systems were reviewed and were negative except for  the following:chest pain  Physical Exam:   Temp:  [98.4 F (36.9 C)] 98.4 F (36.9 C)  Heart Rate:  [90-104] 99   Resp Rate:  [16-22] 20   BP: (97-151)/(56-74) 151/74 mmHg    Intake and Output Summary (Last 24 hours) at Date Time  No intake or output data in the 24 hours ending 07/19/13 1759    Gen: WN WD NAD  HEENT: NC/AT, moist MM, clear oropharynx  Neck: No JVD  CV: S1 S2 N RRR no M/R/GC  Chest:decreased breath sounds  Ab: ND NT Soft no HSM +BS  Skin: Dry  Ext: slight swelling  Psych: Appropriate mood and affect    Labs:     Lab 07/19/13 1303   GLU 187*   BUN 20*   CREAT 3.3*   CA 10.0   NA 133*   K 4.4   CL 93*   CO2 26   ALB --   PHOS --   MG --       Lab 07/19/13 1304   WBC 10.54   RBC 3.78*   HGB 11.7*   HCT 34.7*   MCV 91.8  MCH 31.0   MCHC 33.7   RDW 14   MPV 12.1   PLT 158     Radiology:   Ct Angio Chest    07/19/2013   1. No CT scan evidence for detectable pulmonary embolism. 2. Minimal cardiomegaly. 3. Minimal bibasilar lung atelectasis. 4. Remainder as above.  Sheria Lang, MD  07/19/2013 3:18 PM     Xr Chest Ap Portable    07/19/2013   Hypoinflation with minimal basilar atelectasis.  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 No evidence of acute DVT.  Susy Manor, MD  06/20/2013 7:19 PM     EKG:     Prior Records:   I reviewed her  old records.

## 2013-07-19 NOTE — ED Provider Notes (Signed)
Physician/Midlevel provider first contact with patient: 07/19/13 [place] HISTORY AND PHYSICAL EXAM    Patient Name: Mary [name], Mary [name]  Encounter Date:  07/19/2013  Rendering Provider: Sung Amabile, MD  Patient DOB:  1946-10-14  MRN:  [id number]    History of Presenting Illness     Historian: Patient    67 y.o. female h/o DM, hyperlipidemia, HTN, chronic kidney disease, DVT of LLE, and hemodialysis access site with mature fistula p/w persistent, worsening mid-sternal chest pain that feels like "gas" since waking ~8.5 hours ago. Took Tums with no relief. Reports that pain worsened during dialysis today. She felt some relief with oxygen given during dialysis. States that after dialysis she felt unsteady. Pt is on Coumadin.    PMD:  Alric Quan, MD    Past Medical History     Past Medical History   Diagnosis Date   . Diabetes mellitus without complication    . Hyperlipidemia    . Hypertensive disorder    . Chronic kidney disease      dialysis t-tr-sat   . Arthritis    . Cataracts, bilateral    . GERD (gastroesophageal reflux disease)    . Acute deep vein thrombosis (DVT) of distal vein of left lower extremity    . Hemodialysis access site with mature fistula        Past Surgical History     Past Surgical History   Procedure Date   . Av fistula placement 2012       Family History     History reviewed. No pertinent family history.    Social History     History     Social History   . Marital Status: Widowed     Spouse Name: N/A     Number of Children: N/A   . Years of Education: N/A     Social History Main Topics   . Smoking status: Never Smoker    . Smokeless tobacco: Not on file   . Alcohol Use: No   . Drug Use: No   . Sexually Active:      Other Topics Concern   . Not on file     Social History Narrative   . No narrative on file       Home Medications     Home medications reviewed by ED MD at 12:35 PM     Previous Medications    ACETAMINOPHEN (TYLENOL) 500 MG  TABLET    Take 500 mg by mouth every 6 (six) hours as needed.      AMLODIPINE (NORVASC) 10 MG TABLET    Take 10 mg by mouth daily.      ASPIRIN 81 MG TABLET    Take 81 mg by mouth daily.      ATORVASTATIN (LIPITOR) 10 MG TABLET    Take 10 mg by mouth daily.      B COMPLEX-VITAMIN C-FOLIC ACID (NEPHRO-VITE) 0.8 MG TABS    Take 0.8 mg by mouth.    BISACODYL (DULCOLAX) 5 MG EC TABLET    Take 5 mg by mouth daily as needed.      ESOMEPRAZOLE (NEXIUM) 40 MG CAPSULE    Take 40 mg by mouth every morning before breakfast.      INSULIN ASPART (NOVOLOG) 100 UNIT/ML INJECTION    Inject 1-10 Units into the skin 3 (three) times daily before meals.      INSULIN GLARGINE (LANTUS)  100 UNIT/ML INJECTION    Inject 10-20 Units into the skin nightly.      PREGABALIN (LYRICA) 75 MG CAPSULE    Take 75 mg by mouth 2 (two) times daily.      TRAMADOL (ULTRAM) 50 MG TABLET    Take 1 tablet (50 mg total) by mouth every 6 (six) hours as needed for Pain.       Review of Systems     Review of Systems   Constitutional: Positive for malaise/fatigue.   Cardiovascular: Positive for chest pain.   All other systems reviewed and are negative.        Physical Exam     CONSTITUTIONAL: Vital signs reviewed, Well appearing, Alert and oriented X 3.   HEAD: Atraumatic, Normocephalic.   EYES: Eyes are normal to inspection, Pupils equal, round and reactive to light, Sclera are normal, Conjunctiva are normal.   ENT: Nose examination normal, Posterior pharynx normal, Mouth normal to inspection.   NECK: Normal ROM.   RESPIRATORY CHEST: Chest is nontender, Breath sounds normal, No respiratory distress.   CARDIOVASCULAR: RRR, No murmurs.   ABDOMEN: Abdomen is nontender, Bowel sounds normal, No distension, No peritoneal signs.   BACK: There is no CVA Tenderness, There is no tenderness to palpation.   NEURO: No focal motor deficits, No focal sensory deficits, Speech normal.   SKIN: Skin is warm, Skin is dry, Skin is normal color.   PSYCHIATRIC: Oriented X 3, Normal  affect, Normal insight.     ED Medications Administered     ED Medication Orders      Start     Status Ordering Provider    07/19/13 1553   pantoprazole (PROTONIX) injection 40 mg   Daily      Route: Intravenous  Ordered Dose: 40 mg         Last MAR action:  Given Juron Vorhees SELF    07/19/13 1553   HYDROmorphone (DILAUDID) injection 0.5 mg   Once      Route: Intravenous  Ordered Dose: 0.5 mg         Last MAR action:  Given Quinntin Malter SELF    07/19/13 1553   ondansetron (ZOFRAN) injection 4 mg   Once      Route: Intravenous  Ordered Dose: 4 mg         Last MAR action:  Given Ignatz Deis SELF    07/19/13 1531   nitroglycerin (NITROSTAT) SL tablet 0.4 mg   Every 5 min PRN      Route: Sublingual  Ordered Dose: 0.4 mg         Last MAR action:  Given Nakshatra Klose SELF    07/19/13 1253   famotidine (PEPCID) injection 20 mg   Once      Route: Intravenous  Ordered Dose: 20 mg         Last MAR action:  Given Calianne Larue, [place] During This Encounter     Orders Placed This Encounter   Procedures   . XR Chest AP Portable   . CT Angio Chest   . CBC and differential   . Basic Metabolic Panel   . Creatine Kinase (CK)   . Troponin I   . GFR   . PT/APTT   . Diet consistent carbohydrate   . May be transported off monitor   . ED Unit Sec Comm Order   .  ECG 12 Lead   . ECG 12 lead   . Saline lock IV   . [name] ED Bed Request       Diagnostic Study Results     The results of the diagnostic studies below were reviewed by the ED provider:    Labs  Results     Procedure Component Value Units Date/Time    PT/APTT [id number]  (Abnormal) Collected:07/19/13 1423     PT 20.8 (H) Updated:07/19/13 1441     PT INR 1.8 (H)      PT Anticoag. Given Within 48 hrs. Unknown      PTT 43 (H)     Troponin I [id number] Collected:07/19/13 1304    Specimen Information:Blood Updated:07/19/13 1332     Troponin I 0.02 ng/mL     Basic Metabolic Panel [id number]  (Abnormal) Collected:07/19/13 1303     Specimen Information:Blood Updated:07/19/13 1329     Glucose 187 (H) mg/dL      BUN 20 (H) mg/dL      Creatinine 3.3 (H) mg/dL      CALCIUM 10.0 mg/dL      Sodium 133 (L)      Potassium 4.4      Chloride 93 (L)      CO2 26      Anion Gap 14.0     Creatine Kinase (CK) [id number] Collected:07/19/13 1303    Specimen Information:Blood Updated:07/19/13 1329     Creatine Kinase (CK) 124 U/L     GFR [id number] Collected:07/19/13 1303     EGFR 16.8 Updated:07/19/13 1329    CBC and differential [[id number]]  (Abnormal) Collected:07/19/13 1304    Specimen Information:Blood / Blood Updated:07/19/13 1311     WBC 10.54      RBC 3.78 (L)      Hgb 11.7 (L) g/dL      Hematocrit 34.7 (L) %      MCV 91.8 fL      MCH 31.0 pg      MCHC 33.7 g/dL      RDW 14 %      Platelets 158      MPV 12.1 fL      Neutrophils 74 %      Lymphocytes Automated 18 %      Monocytes 7 %      Eosinophils Automated 0 %      Basophils Automated 0 %      Immature Granulocyte 0 %      Nucleated RBC 0      Neutrophils Absolute 7.78      Abs Lymph Automated 1.91      Abs Mono Automated 0.77      Abs Eos Automated 0.05      Absolute Baso Automated 0.03      Absolute Immature Granulocyte 0.03           Radiologic Studies  Radiology Results (24 Hour)     Procedure Component Value Units Date/Time    CT Angio Chest [id number] Collected:07/19/13 1513    Order Status:Completed  Updated:07/19/13 1522    Narrative:    Reason for exam: 67 year old female with chest pain. Concern for  pulmonary embolism.    TECHNIQUE: Spiral CT angiography after IV contrast. 95 cc Visipaque  administered. MIPS generated.    FINDINGS:  There are no detectable pulmonary emboli. The pulmonary arteries are  normal caliber.    The heart is minimally enlarged. Aorta reveals no aneurysm or  dissection. There is no pericardial effusion.  The lungs reveal minimal bibasilar atelectasis. No infiltrates are seen.  There are no pleural effusions. There is no pneumothorax.    There is mild arthritis  in the spine. Thyroid gland is minimally  enlarged.      Impression:      1. No CT scan evidence for detectable pulmonary embolism.  2. Minimal cardiomegaly.  3. Minimal bibasilar lung atelectasis.  4. Remainder as above.    Sheria Lang, MD   07/19/2013 3:18 PM    XR Chest AP Portable [id number] Collected:07/19/13 1443    Order Status:Completed  Updated:07/19/13 1449    Narrative:    Reason for exam: 68 year old female with chest pain.    FINDINGS:  Portable AP view.  The lungs are hypoinflated with minimal basilar atelectasis. The heart  is top normal size. There is no pneumothorax.      Impression:     Hypoinflation with minimal basilar atelectasis.    Sheria Lang, MD   07/19/2013 2:45 PM          Scribe and MD Attestations     I, Sung Amabile, MD, personally performed the services documented. Arne Cleveland is scribing for me on [place] G. I reviewed and confirm the accuracy of the information in this medical record.    I, Arne Cleveland, am serving as a Education administrator to document services personally performed by Sung Amabile, MD, based on the provider's statements to me.     Rendering Provider: Sung Amabile, MD    Monitors, EKG, Critical Care, and Splints     EKG (interpreted by ED physician): Normal sinus rhythm, rate at 92 bpm, nonspecific ST changes, nonspecific EKG.  Cardiac Monitor (interpreted by ED physician): N/A    Critical Care:   Splint check:      MDM and Clinical Notes     Notes: Pt denies having pain like this in the past. Her pain did not seem to change after nitroglycerin but is much better after Protonix and Dilaudid. Pt was belching and spitting up. Likely this is GERD/PUD. Will be admitted.    Consults:    Diagnosis and Disposition     Clinical Impression  1. Chest pain        Disposition  ED Disposition     Admit Bed Type: Telemetry [5]  Admitting Physician: Laroy Apple [id number]  Patient Class: Observation [104]            Prescriptions     New Prescriptions    No  medications on file                 Truddie Coco Self, MD  07/21/13 2142

## 2013-07-19 NOTE — Progress Notes (Signed)
Patient is admitted to room 426 from ED with CP. BP is 151/74, HR is 99. C/o CP 8/10. Dr. Emelia Loron is notified patient's arrival and condition. Stat EKG and  ck and troponin done, sublingual nitroglycerine X2 given per MD's order. Patient's chest down to 4/10 after two nitro but BP is down to 108/71. Patient still c/o epigastric pain and is very uncomfortable. Dr.Valosky is notified again. Dilaudid 0.5mg  IV given per Dr.Valosky's order. Patient is resting comfortable in bed. Pain level is 1/10 at epigastric area.

## 2013-07-20 ENCOUNTER — Inpatient Hospital Stay: Payer: Medicare Other

## 2013-07-20 ENCOUNTER — Ambulatory Visit: Payer: Medicare Other

## 2013-07-20 LAB — ECG 12-LEAD
Atrial Rate: 92 {beats}/min
Atrial Rate: 100 {beats}/min
Atrial Rate: 105 {beats}/min
P Axis: 61 degrees
P Axis: 63 degrees
P Axis: 57 degrees
P-R Interval: 164 ms
P-R Interval: 160 ms
P-R Interval: 140 ms
Q-T Interval: 360 ms
Q-T Interval: 362 ms
Q-T Interval: 334 ms
QRS Duration: 88 ms
QRS Duration: 86 ms
QRS Duration: 84 ms
QTC Calculation (Bezet): 445 ms
QTC Calculation (Bezet): 466 ms
QTC Calculation (Bezet): 441 ms
R Axis: -20 degrees
R Axis: 12 degrees
R Axis: -20 degrees
T Axis: 54 degrees
T Axis: 49 degrees
T Axis: 34 degrees
Ventricular Rate: 92 {beats}/min
Ventricular Rate: 100 {beats}/min
Ventricular Rate: 105 {beats}/min

## 2013-07-20 LAB — POCT GLUCOSE
Whole Blood Glucose POCT: 290 mg/dL — AB (ref 70–100)
Whole Blood Glucose POCT: 201 mg/dL — AB (ref 70–100)
Whole Blood Glucose POCT: 165 mg/dL — AB (ref 70–100)
Whole Blood Glucose POCT: 355 mg/dL — AB (ref 70–100)

## 2013-07-20 LAB — TROPONIN I
Troponin I: 0.01 ng/mL (ref 0.00–0.09)
Troponin I: 0.04 ng/mL (ref 0.00–0.09)

## 2013-07-20 LAB — CK
Creatine Kinase (CK): 68 U/L (ref 29–168)
Creatine Kinase (CK): 50 U/L (ref 29–168)

## 2013-07-20 LAB — TSH: TSH: 0.59 (ref 0.35–4.94)

## 2013-07-20 MED ORDER — IBUPROFEN 600 MG PO TABS
600.0000 mg | ORAL_TABLET | Freq: Three times a day (TID) | ORAL | Status: DC
Start: 2013-07-20 — End: 2013-07-21
  Administered 2013-07-20 – 2013-07-21 (×3): 600 mg via ORAL
  Filled 2013-07-20 (×3): qty 1

## 2013-07-20 NOTE — Plan of Care (Signed)
Problem: Pain  Goal: Patient's pain/discomfort is manageable  Outcome: Progressing  Pt resting well overnight. No c/o chest pain. Pt drowsy, but easily arousable. VSS, afebrile, 4LNC, Tele- SR. Pt tolerating po. Able to swallow HS pills. No void or BM yet this evening. No acute events. Will con't to monitor pt for changes and con't to ensure pt safety.

## 2013-07-20 NOTE — Progress Notes (Signed)
Mary [name] is a 67 y.o. female patient with PMH notable for ESRD on hemodialysis, DM, HTN, admitted because of chest pain.  .  Active Problems:   Chest pain at rest    Past Medical History   Diagnosis Date   . Diabetes mellitus without complication    . Hyperlipidemia    . Hypertensive disorder    . Chronic kidney disease      dialysis t-tr-sat   . Arthritis    . Cataracts, bilateral    . GERD (gastroesophageal reflux disease)    . Acute deep vein thrombosis (DVT) of distal vein of left lower extremity    . Hemodialysis access site with mature fistula      Current Facility-Administered Medications   Medication Dose Route Frequency Provider Last Rate Last Dose   . acetaminophen (TYLENOL) tablet 500 mg  500 mg Oral Q6H PRN Shariff, Imran, MD       . aspirin chewable tablet 81 mg  81 mg Oral Daily Shariff, Imran, MD   81 mg at 07/20/13 1015   . atorvastatin (LIPITOR) tablet 10 mg  10 mg Oral QHS Shariff, Imran, MD   10 mg at 07/19/13 2146   . bisacodyl (DULCOLAX) suppository 10 mg  10 mg Rectal QD PRN Shariff, Imran, MD       . dextrose (GLUCOSE) 40 % oral gel 15 g  15 g Oral PRN Shariff, Imran, MD       . dextrose 50 % bolus 25 mL  25 mL Intravenous PRN Shariff, Imran, MD       . diphenhydrAMINE (BENADRYL) capsule 25 mg  25 mg Oral QD PRN Shariff, Imran, MD       . [COMPLETED] famotidine (PEPCID) injection 20 mg  20 mg Intravenous Once Truddie Coco Self, MD   20 mg at 07/19/13 1426   . glucagon (rDNA) (GLUCAGEN) injection 1 mg  1 mg Intramuscular PRN Shariff, Imran, MD       . [COMPLETED] HYDROmorphone (DILAUDID) injection 0.5 mg  0.5 mg Intravenous Once Truddie Coco Self, MD   0.5 mg at 07/19/13 1613   . insulin aspart (NovoLOG) injection 1-5 Units  1-5 Units Subcutaneous TID AC PRN Merrilee Jansky, MD   2 Units at 07/20/13 1338   . insulin glargine (LANTUS) injection 4 Units  4 Units Subcutaneous QHS Shariff, Imran, MD   4 Units at 07/19/13 2147   . [COMPLETED] iodixanol (VISIPAQUE) 320 MG/ML injection  100 mL  100 mL Intravenous ONCE PRN Truddie Coco Self, MD   100 mL at 07/19/13 1510   . lactulose (CHRONULAC) 10 GM/15ML solution 20 g  20 g Oral Q8H Frannie, MD   20 g at 07/20/13 1338   . [COMPLETED] nitroglycerin (NITROSTAT) SL tablet 0.4 mg  0.4 mg Sublingual Q5 Min PRN Truddie Coco Self, MD   0.4 mg at 07/19/13 1754   . nitroglycerin (NITROSTAT) SL tablet 0.4 mg  0.4 mg Sublingual Q5 Min PRN Laroy Apple, MD   0.4 mg at 07/19/13 1759   . nitroglycerin (NITROSTAT) SL tablet 0.4 mg  0.4 mg Sublingual Q5 Min PRN Shariff, Imran, MD       . [COMPLETED] ondansetron (ZOFRAN) injection 4 mg  4 mg Intravenous Once Truddie Coco Self, MD   4 mg at 07/19/13 1613   . pantoprazole (PROTONIX) EC tablet 40 mg  40 mg Oral QAM AC Shariff, Imran, MD   40 mg at 07/20/13 0818   . promethazine (  PHENERGAN) injection 12.5 mg  12.5 mg Intravenous Q6H PRN Shariff, Imran, MD       . simethicone (MYLICON) chewable tablet 80 mg  80 mg Oral BID Shariff, Imran, MD   80 mg at 07/19/13 2146   . warfarin (COUMADIN) tablet 5 mg  5 mg Oral Daily at 1800 Shariff, Imran, MD   5 mg at 07/19/13 2146   . [DISCONTINUED] cyclobenzaprine (FLEXERIL) tablet 10 mg  10 mg Oral TID Merrilee Jansky, MD   10 mg at 07/19/13 2147   . [DISCONTINUED] gabapentin (NEURONTIN) capsule 300 mg  300 mg Oral Q8H [place] Shariff, Imran, MD   300 mg at 07/20/13 0621   . [DISCONTINUED] HYDROmorphone (DILAUDID) injection 0.2 mg  0.2 mg Intravenous Q3H PRN Shariff, Imran, MD       . [DISCONTINUED] HYDROmorphone (DILAUDID) injection 0.5 mg  0.5 mg Intravenous Q4H PRN Laroy Apple, MD   0.5 mg at 07/19/13 1829   . [DISCONTINUED] morphine injection 2 mg  2 mg Intravenous Q4H PRN Laroy Apple, MD       . [DISCONTINUED] pantoprazole (PROTONIX) injection 40 mg  40 mg Intravenous Daily Truddie Coco Self, MD   40 mg at 07/19/13 1613   . [DISCONTINUED] pregabalin (LYRICA) capsule 75 mg  75 mg Oral Q12H [place] Shariff, Imran, MD   75 mg at  07/20/13 1015     Allergies   Allergen Reactions   . Penicillins Hives   . Shrimp (Shellfish Allergy) Swelling     Blood pressure 134/61, pulse 97, temperature 98 F (36.7 C), temperature source Axillary, resp. rate 16, height 1.6 m (5\' 3" ), weight 81.647 kg (180 lb), SpO2 94.00%.    Subjective:  Symptoms:  She reports shortness of breath, chest pain, weakness and chest pressure.    Diet:  She reports  nausea.    Activity level: Impaired due to weakness.    Pain:  She complains of pain that is moderate.      Objective:  General Appearance:  In no acute distress.    Vital signs: (most recent): Blood pressure 134/61, pulse 97, temperature 98 F (36.7 C), temperature source Axillary, resp. rate 16, height 1.6 m (5\' 3" ), weight 81.647 kg (180 lb), SpO2 94.00%.    Output: No urine output and producing stool.    Lungs:  There are decreased breath sounds.    Heart: Tachycardia.    Extremities: There is local extremity swelling.    Abdomen: Abdomen is soft.  There is generalized tenderness.       Results     Procedure Component Value Units Date/Time    POCT glucose (AC and HS) [id number]  (Abnormal) Collected:07/20/13 1200     POCT Glucose WB 201 (A) mg/dL Updated:07/20/13 1225    TSH [id number] Collected:07/20/13 0012    Specimen Information:Blood Updated:07/20/13 0944    Troponin I [id number] Collected:07/20/13 0755    Specimen Information:Blood Updated:07/20/13 0858     Troponin I 0.04 ng/mL     Narrative:    CK MB    CREATINE KINASE LEVEL (CK) [id number] Collected:07/20/13 0755    Specimen Information:Blood Updated:07/20/13 0845     Creatine Kinase (CK) 50 U/L     Narrative:    CK MB    POCT glucose (AC and HS) [id number]  (Abnormal) Collected:07/20/13 0650     POCT Glucose WB 290 (A) mg/dL Updated:07/20/13 0650    Troponin I [id number] Collected:07/20/13 0017    Specimen Information:Blood Updated:07/20/13 0129  Troponin I 0.01 ng/mL     Narrative:    CK MB    CREATINE KINASE LEVEL (CK) [id number]  Collected:07/20/13 0017    Specimen Information:Blood Updated:07/20/13 0122     Creatine Kinase (CK) 68 U/L     Narrative:    CK MB    POCT glucose (AC and HS) [id number]  (Abnormal) Collected:07/19/13 2110     POCT Glucose WB 310 (A) mg/dL Updated:07/19/13 2201    Troponin I [id number] Collected:07/19/13 1817    Specimen Information:Blood Updated:07/19/13 1912     Troponin I 0.02 ng/mL     Creatine Kinase (CK) [id number] Collected:07/19/13 1817    Specimen Information:Blood Updated:07/19/13 1908     Creatine Kinase (CK) 89 U/L     PT/APTT [id number]  (Abnormal) Collected:07/19/13 1423     PT 20.8 (H) Updated:07/19/13 1441     PT INR 1.8 (H)      PT Anticoag. Given Within 48 hrs. Unknown      PTT 43 (H)       Ct Abdomen Pelvis Wo Iv/ Wo Po Cont    07/20/2013   1. Interval development of a minimal to moderate pericardial effusion and trace pleural effusions since prior exam one day earlier. 2. No detectable acute abnormalities in the abdomen or pelvis. 3. Contrast residue gallbladder, kidneys, and bladder. 4. Numerous small low-attenuation kidney lesions. 5. Remainder as above. 6. Findings discussed with Dr. Beryle Lathe by Dr. Oren Binet at 3:59 AM.  Sheria Lang, MD  07/20/2013 8:23 AM     Ct Angio Chest    07/19/2013   1. No CT scan evidence for detectable pulmonary embolism. 2. Minimal cardiomegaly. 3. Minimal bibasilar lung atelectasis. 4. Remainder as above.  Sheria Lang, MD  07/19/2013 3:18 PM     Xr Chest Ap Portable    07/19/2013   Hypoinflation with minimal basilar atelectasis.  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 No evidence of acute DVT.  Susy [place], MD  06/20/2013 7:19 PM     Assessment:  (1.chest pain  2.ESRD  3.HTN  4.DM).       Plan:   (1.Hemodialysis as per schedule  2.monitor serum electrolytes).       Alric Quan  07/20/2013

## 2013-07-20 NOTE — Consults (Signed)
[facility]    Date Time: 07/20/2013 4:22 PM  Patient Name: Mary [name]  Requesting Physician: Laroy Apple, MD       Reason for Consultation:   Chest pain      History:   Mary [name] is a 67 y.o. female admitted on 07/19/2013, for whom we are asked to provide cardiac consultation. She has no history of cardiac disease. She has diabetes and ESRD on HD. She has a DVT and was recently started on coumadin. For the past 2 days she has had constant midsternal chest pain that's worse with inspiration and when lying flat. CT chest showed no PE but did show a pericardial effusion. Echo today shows a small, 1cm effusion. She is pain free now. She did have a recent URI.    Past Medical History:     Past Medical History   Diagnosis Date   . Diabetes mellitus without complication    . Hyperlipidemia    . Hypertensive disorder    . Chronic kidney disease      dialysis t-tr-sat   . Arthritis    . Cataracts, bilateral    . GERD (gastroesophageal reflux disease)    . Acute deep vein thrombosis (DVT) of distal vein of left lower extremity    . Hemodialysis access site with mature fistula        Past Surgical History:     Past Surgical History   Procedure Date   . Av fistula placement 2012       Family History:   History reviewed. No pertinent family history.    Social History:     History     Social History   . Marital Status: Widowed     Spouse Name: N/A     Number of Children: N/A   . Years of Education: N/A     Social History Main Topics   . Smoking status: Never Smoker    . Smokeless tobacco: Not on file   . Alcohol Use: No   . Drug Use: No   . Sexually Active:      Other Topics Concern   . Not on file     Social History Narrative   . No narrative on file       Allergies:     Allergies   Allergen Reactions   . Penicillins Hives   . Shrimp (Shellfish Allergy) Swelling       Medications:     Prescriptions prior to admission   Medication Sig   . acetaminophen  (TYLENOL) 500 MG tablet Take 500 mg by mouth every 6 (six) hours as needed.     Marland Kitchen amLODIPine (NORVASC) 10 MG tablet Take 10 mg by mouth daily.     Marland Kitchen aspirin 81 MG tablet Take 81 mg by mouth daily.     Marland Kitchen atorvastatin (LIPITOR) 10 MG tablet Take 10 mg by mouth daily.     Marland Kitchen b complex-vitamin c-folic acid (NEPHRO-VITE) 0.8 MG TABS Take 0.8 mg by mouth.   . bisacodyl (DULCOLAX) 5 MG EC tablet Take 5 mg by mouth daily as needed.     Marland Kitchen esomeprazole (NEXIUM) 40 MG capsule Take 40 mg by mouth every morning before breakfast.     . insulin aspart (NOVOLOG) 100 UNIT/ML injection Inject 1-10 Units into the skin 3 (three) times daily before meals.     . insulin glargine (LANTUS) 100 UNIT/ML injection Inject 10-20 Units into the  skin nightly.     . pregabalin (LYRICA) 75 MG capsule Take 75 mg by mouth 2 (two) times daily.     . traMADOL (ULTRAM) 50 MG tablet Take 1 tablet (50 mg total) by mouth every 6 (six) hours as needed for Pain.   . warfarin (COUMADIN) 5 MG tablet Take 5 mg by mouth daily.       Current Facility-Administered Medications   Medication Dose Route Frequency Provider Last Rate Last Dose   . acetaminophen (TYLENOL) tablet 500 mg  500 mg Oral Q6H PRN Shariff, Imran, MD       . aspirin chewable tablet 81 mg  81 mg Oral Daily Shariff, Imran, MD   81 mg at 07/20/13 1015   . atorvastatin (LIPITOR) tablet 10 mg  10 mg Oral QHS Shariff, Imran, MD   10 mg at 07/19/13 2146   . bisacodyl (DULCOLAX) suppository 10 mg  10 mg Rectal QD PRN Shariff, Imran, MD       . dextrose (GLUCOSE) 40 % oral gel 15 g  15 g Oral PRN Shariff, Imran, MD       . dextrose 50 % bolus 25 mL  25 mL Intravenous PRN Shariff, Imran, MD       . diphenhydrAMINE (BENADRYL) capsule 25 mg  25 mg Oral QD PRN Shariff, Imran, MD       . glucagon (rDNA) (GLUCAGEN) injection 1 mg  1 mg Intramuscular PRN Shariff, Imran, MD       . insulin aspart (NovoLOG) injection 1-5 Units  1-5 Units Subcutaneous TID AC PRN Shariff, Imran, MD   2 Units at 07/20/13 1338   .  insulin glargine (LANTUS) injection 4 Units  4 Units Subcutaneous QHS Shariff, Imran, MD   4 Units at 07/19/13 2147   . lactulose (CHRONULAC) 10 GM/15ML solution 20 g  20 g Oral Q8H Kinnelon, MD   20 g at 07/20/13 1338   . [COMPLETED] nitroglycerin (NITROSTAT) SL tablet 0.4 mg  0.4 mg Sublingual Q5 Min PRN Truddie Coco Self, MD   0.4 mg at 07/19/13 1754   . nitroglycerin (NITROSTAT) SL tablet 0.4 mg  0.4 mg Sublingual Q5 Min PRN Laroy Apple, MD   0.4 mg at 07/19/13 1759   . nitroglycerin (NITROSTAT) SL tablet 0.4 mg  0.4 mg Sublingual Q5 Min PRN Shariff, Imran, MD       . pantoprazole (PROTONIX) EC tablet 40 mg  40 mg Oral QAM AC Shariff, Imran, MD   40 mg at 07/20/13 0818   . promethazine (PHENERGAN) injection 12.5 mg  12.5 mg Intravenous Q6H PRN Shariff, Imran, MD       . simethicone (MYLICON) chewable tablet 80 mg  80 mg Oral BID Shariff, Imran, MD   80 mg at 07/19/13 2146   . warfarin (COUMADIN) tablet 5 mg  5 mg Oral Daily at 1800 Shariff, Imran, MD   5 mg at 07/19/13 2146   . [DISCONTINUED] cyclobenzaprine (FLEXERIL) tablet 10 mg  10 mg Oral TID Merrilee Jansky, MD   10 mg at 07/19/13 2147   . [DISCONTINUED] gabapentin (NEURONTIN) capsule 300 mg  300 mg Oral Q8H Wallace Shariff, Imran, MD   300 mg at 07/20/13 0621   . [DISCONTINUED] HYDROmorphone (DILAUDID) injection 0.2 mg  0.2 mg Intravenous Q3H PRN Shariff, Imran, MD       . [DISCONTINUED] HYDROmorphone (DILAUDID) injection 0.5 mg  0.5 mg Intravenous Q4H PRN Laroy Apple, MD   0.5 mg at  07/19/13 1829   . [DISCONTINUED] morphine injection 2 mg  2 mg Intravenous Q4H PRN Laroy Apple, MD       . [DISCONTINUED] pantoprazole (PROTONIX) injection 40 mg  40 mg Intravenous Daily Truddie Coco Self, MD   40 mg at 07/19/13 1613   . [DISCONTINUED] pregabalin (LYRICA) capsule 75 mg  75 mg Oral Q12H [place]on Shariff, Imran, MD   75 mg at 07/20/13 1015         Review of Systems:    Comprehensive review of systems including  constitutional, eyes, ears, nose, mouth, throat, cardiovascular, GI, GU, musculoskeletal, integumentary, respiratory, neurologic, psychiatric, and endocrine is negative other than what is mentioned already in the history of present illness    Physical Exam:     Filed Vitals:    07/20/13 1618   BP: 119/65   Pulse: 120   Temp: 99 F (37.2 C)   Resp: 18   SpO2: 94%     Temp (24hrs), Avg:98.8 F (37.1 C), Min:97.8 F (36.6 C), Max:99.9 F (37.7 C)      Intake and Output Summary (Last 24 hours) at Date Time  No intake or output data in the 24 hours ending 07/20/13 1622    GENERAL: Patient is in no acute distress   HEENT: No scleral icterus or conjunctival pallor, moist mucous membranes   NECK: No jugular venous distention or thyromegaly, normal carotid upstrokes without bruits   CARDIAC: Normal apical impulse, regular rate and rhythm, with normal S1 and S2, and no murmurs, rubs, or gallops   CHEST: Clear to auscultation bilaterally, normal respiratory effort  ABDOMEN: No abdominal bruits, masses, or hepatosplenomegaly, nontender, non-distended, good bowel sounds   EXTREMITIES: No clubbing, cyanosis, or edema, 2+ DP, PT, and femoral pulses bilaterally without bruits  SKIN: No rash or jaundice   NEUROLOGIC: Alert and oriented to time, place and person, normal mood and affect  MUSCULOSKELETAL: Normal muscle strength and tone.      Labs Reviewed:       Lab 07/20/13 0755 07/20/13 0017 07/19/13 1817   CK 50 68 89   TROPI 0.04 0.01 0.02   TROPT -- -- --   CKMBINDEX -- -- --     No results found for this basename: DIG in the last 168 hours  No results found for this basename: CHOL:3,TRIG:3,HDL:3,LDL:3 in the last 168 hours  No results found for this basename: BILITOTAL,BILIDIRECT,PROT,ALB,ALT,AST in the last 168 hours  No results found for this basename: MG in the last 168 hours    Lab 07/19/13 1423   PT 20.8*   INR 1.8*   PTT 43*       Lab 07/19/13 1304   WBC 10.54   HGB 11.7*   HCT 34.7*   PLT 158       Lab 07/19/13 1303    NA 133*   K 4.4   CL 93*   CO2 26   BUN 20*   CREAT 3.3*   EGFR 16.8   GLU 187*   CA 10.0         Radiology   Radiological Procedure reviewed.      chest X-ray    ECG- NSR with slight diffuse ST elevation      Assessment:    Probable viral pericarditis. Clinical picture, echo, ECG all fit   ESRD- not uremic   Diabetes   DVT - on coumadin    Recommendations:    Ibuprofen 600mg  tid with food for 5-7 days  Would continue PPI   Follow up limited echo next week in the office to make sure the effusion is not increasing in size, especially in light of anticoagulation.            Signed by: Reina Fuse, MD    [place]  NP [place] (8am-5pm)  MD [employer] [phone] (8am-5pm)  After hours, non urgent consult line [phone]  After Hours, urgent consults [phone]

## 2013-07-20 NOTE — Progress Notes (Signed)
[facility]  HOSPITALIST  PROGRESS NOTE      Patient: Mary [name]  Date: 07/20/2013   LOS: 1 Days  Admission Date: 07/19/2013   MRN: [id number]  Attending: Evans Lance MD     ASSESSMENT/PLAN     ARLEN BAKARE is a 67 y.o. female admitted with chest pain secondary to a pericardial effusion.     1. Chest Pain/Moderate Pericardial Effusion/Viral Pericarditis: not from uremia, BUN WNL; WBC WNL, check TSH, trop neg x2; await ECHO; cardiology involved, appreciate recs; started on scheduled ibuprofen w/ food; plan to repeat ECHO in one ewek  2. HTN: fairly well controlled  3. IDDM: not well controlled, lantus 4 units qHS and ISS, will increase to moderate/high dose SS if continues to be uncontrolled today  4. ESRD on HD (TTS): plan for HD tomorrow; HD MGMT as per Dr. Leonette Most, Gwenith Daily, MD   5. 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 Chronic Peripheral Neuropathy: continue gabapentin  7. HLD: continue lipitor 10    Nutrition: Renal Diet    GI Prophylaxis: not indicated    DVT Prophylaxis: Coumadin 5 mg daily and SCDs       Code Status: full code    DISPO: pending    Family Contact: none    Care Plan discussed with nursing, consultants, case manager.     SUBJECTIVE     Sleepy, c/o of chest pain, no SOB, no fevers, no other complaints    MEDICATIONS     Current Facility-Administered Medications   Medication Dose Route Frequency   . aspirin  81 mg Oral Daily   . atorvastatin  10 mg Oral QHS   . cyclobenzaprine  10 mg Oral TID   . [COMPLETED] famotidine  20 mg Intravenous Once   . gabapentin  300 mg Oral Q8H [place]   . [COMPLETED] HYDROmorphone  0.5 mg Intravenous Once   . insulin glargine  4 Units Subcutaneous QHS   . lactulose  20 g Oral Q8H [place]   . [COMPLETED] ondansetron  4 mg Intravenous Once   . pantoprazole  40 mg Oral QAM AC   . pregabalin  75 mg Oral Q12H [place]   . simethicone  80 mg Oral BID   . warfarin  5 mg Oral Daily  at 1800   . [DISCONTINUED] pantoprazole  40 mg Intravenous Daily       ROS     Remainder of 10 point ROS as above or otherwise negative    PHYSICAL EXAM     Filed Vitals:    07/20/13 0826   BP: 138/66   Pulse: 114   Temp: 99.4 F (37.4 C)   Resp: 16   SpO2: 95%       Temperature: Temp  Min: 97.8 F (36.6 C)  Max: 99.9 F (37.7 C)  Pulse: Pulse  Min: 90   Max: 114   Respiratory: Resp  Min: 16   Max: 22   Non-Invasive BP: BP  Min: 97/56  Max: 157/75  Pulse Oximetry SpO2  Min: 90 %  Max: 100 %    Intake and Output Summary (Last 24 hours) at Date Time  No intake or output data in the 24 hours ending 07/20/13 1913    GEN APPEARANCE: Normal;  Somnolent  HEENT: PERLA; EOMI; Conjunctiva Clear  NECK: Supple; No bruits  CVS: sinus tach, S1, S2; No M/G/R  LUNGS:  CTAB; No Wheezes; No Rhonchi: No rales  ABD: Soft; No TTP; + Normoactive BS  EXT: No edema; Pulses 2+ and intact  SKIN: No rash or Lesions  NEURO: CN 2-12 intact; No Focal neurological deficits      LABS       Lab 07/19/13 1304   WBC 10.54   RBC 3.78*   HGB 11.7*   HCT 34.7*   MCV 91.8   PLT 158         Lab 07/19/13 1303   NA 133*   K 4.4   CL 93*   CO2 26   BUN 20*   CREAT 3.3*   GLU 187*   CA 10.0   MG --       No results found for this basename: ALT:3,AST:3,GGT:3,BILITOTAL:3,BILIDIRECT:3,ALB:3,ALKPHOS:3 in the last 168 hours      Lab 07/20/13 0755 07/20/13 0017 07/19/13 1817   CK 50 68 89   CKMB -- -- --   CKMBINDEX -- -- --   TROPI 0.04 0.01 0.02         Lab 07/19/13 1423   INR 1.8*   PT 20.8*   PTT 43*         RADIOLOGY     Radiological Procedure reviewed.    XR CHEST AP PORTABLE    Final Result:  Hypoinflation with minimal basilar atelectasis.         Sheria Lang, MD     07/19/2013 2:45 PM   CT ANGIOGRAM CHEST    Final Result:      1. No CT scan evidence for detectable pulmonary embolism.    2. Minimal cardiomegaly.    3. Minimal bibasilar lung atelectasis.    4. Remainder as above.        Sheria Lang, MD     07/19/2013 3:18 PM   CT ABDOMEN PELVIS WO IV/ WO PO  CONT    Final Result:      1. Interval development of a minimal to moderate pericardial effusion    and trace pleural effusions since prior exam one day earlier.    2. No detectable acute abnormalities in the abdomen or pelvis.    3. Contrast residue gallbladder, kidneys, and bladder.    4. Numerous small low-attenuation kidney lesions.    5. Remainder as above.    6. Findings discussed with Dr. Beryle Lathe by Dr. Oren Binet at 3:59 AM.        Sheria Lang, MD     07/20/2013 8:23 AM   ECHOCARDIOGRAM ADULT COMP W CLR/ DOPP WV INC BUBBLE STDY    (Results Pending)   ECHOCARDIOGRAM ADULT COMPLETE W CLR/ DOPP WAVEFORM    (Results Pending)       Signed,  Evans Lance, MD  9:21 AM 07/20/2013

## 2013-07-20 NOTE — Plan of Care (Signed)
Problem: Safety  Goal: Patient will be free from injury during hospitalization  Outcome: Progressing  Patient assess as high  Risk for falls, fall precautions in placed. Pt very drowsy Dr Larrie Kass  Aware medications reviewed and changed, see orders/MAR.

## 2013-07-20 NOTE — Progress Notes (Signed)
Continuous pulse requested by Dr  Larrie Kass 02 95% on 2l n/c.

## 2013-07-20 NOTE — Plan of Care (Signed)
Patient more alert, was able to get out of bed to bedside commode attempt to urinate with the assistance of x1 nurse.

## 2013-07-21 LAB — CBC
Hematocrit: 38.8 % (ref 37.0–47.0)
Hgb: 13.1 g/dL (ref 12.0–16.0)
MCH: 30.7 pg (ref 28.0–32.0)
MCHC: 33.8 g/dL (ref 32.0–36.0)
MCV: 90.9 fL (ref 80.0–100.0)
MPV: 11.9 fL (ref 9.4–12.3)
Nucleated RBC: 0 (ref 0–1)
Platelets: 157 (ref 140–400)
RBC: 4.27 (ref 4.20–5.40)
RDW: 13 % (ref 12–15)
WBC: 15.31 — ABNORMAL HIGH (ref 3.50–10.80)

## 2013-07-21 LAB — POCT GLUCOSE
Whole Blood Glucose POCT: 216 mg/dL — AB (ref 70–100)
Whole Blood Glucose POCT: 326 mg/dL — AB (ref 70–100)
Whole Blood Glucose POCT: 209 mg/dL — AB (ref 70–100)

## 2013-07-21 MED ORDER — HEPARIN SODIUM (PORCINE) 1000 UNIT/ML IJ SOLN
1000.0000 [IU] | INTRAMUSCULAR | Status: AC | PRN
Start: 2013-07-21 — End: 2013-07-21
  Administered 2013-07-21: 1000 [IU]

## 2013-07-21 MED ORDER — SODIUM CHLORIDE 0.9 % IV BOLUS
100.0000 mL | INTRAVENOUS | Status: AC | PRN
Start: 2013-07-21 — End: 2013-07-21

## 2013-07-21 MED ORDER — BISACODYL 5 MG PO TBEC
5.00 mg | DELAYED_RELEASE_TABLET | Freq: Once | ORAL | Status: AC
Start: 2013-07-21 — End: 2013-07-21
  Administered 2013-07-21: 5 mg via ORAL
  Filled 2013-07-21: qty 1

## 2013-07-21 MED ORDER — AMLODIPINE BESYLATE 2.5 MG PO TABS
2.5000 mg | ORAL_TABLET | Freq: Every day | ORAL | Status: DC
Start: 2013-07-21 — End: 2013-07-21
  Filled 2013-07-21: qty 1

## 2013-07-21 MED ORDER — HEPARIN SODIUM (PORCINE) 1000 UNIT/ML IJ SOLN
1000.0000 [IU] | INTRAMUSCULAR | Status: AC | PRN
Start: 2013-07-21 — End: 2013-07-21
  Filled 2013-07-21: qty 1

## 2013-07-21 MED ORDER — SODIUM CHLORIDE 0.9 % IV BOLUS
250.0000 mL | INTRAVENOUS | Status: AC | PRN
Start: 2013-07-21 — End: 2013-07-21

## 2013-07-21 MED ORDER — LIDOCAINE-TRANSPARENT DRESSING 4 % EX KIT
PACK | CUTANEOUS | Status: DC | PRN
Start: 2013-07-21 — End: 2013-07-21
  Administered 2013-07-21: 1 via TOPICAL
  Filled 2013-07-21: qty 1

## 2013-07-21 MED ORDER — IBUPROFEN 600 MG PO TABS
600.0000 mg | ORAL_TABLET | Freq: Three times a day (TID) | ORAL | Status: AC
Start: 2013-07-21 — End: 2013-07-28

## 2013-07-21 NOTE — Plan of Care (Signed)
Pt will free from injury.

## 2013-07-21 NOTE — Progress Notes (Signed)
[facility] Case Management Initial Discharge Planning Assessment:    This CM/SW met with patient, introduced self and Case Management Services.     Psychosocial/Demographic Information:  1) Name of person Mary [name], relationship with patient and best contact information:Patient    2) Pt lives with - Daughter Jari Sportsman, [phone]; Daughter    3) Type of Residence, # of steps: House    4) Prior level of functioning - Independent    5) Insurance information, Medication Coverage on facesheet is verified:Medicare, Medicaid    6) Community Connections:    7) Any additional emergency contacts:Same as above    8) Advanced Directives on the chart?No    9) Healthcare Decision Maker and relationship to patient, best contact information:Patient    10) Palliative Care or Hospice involvement?No      DME, SNF, HH needs:  1) Current DME's at home or used in the past:[facility]    2) SNF or Acute Rehab placement prior to admission:None    3) [place] currently being used or used in the past:N    4) Referrals needed:Y or N -- No   A) SNF/Acute Rehab/LTACH    B) PACE   C) TCM   D) [place]) [facility] or [place]) [place] (referred to [facility] Liaison?)   G) DME   I) Others    Discharge Needs:   A) PCP, follow-up appointment made?Dr. Marigene Ehlers) Transportation Needs:Pt drives   C) Others    Plan of Care and possible d/c date explained to patient/family member:Yes    LACE Index score: _10______    1st IMM given and signed?yes    Any discharge barriers:None    Additional Note:   Known dialysis patient.  States she drives herself to and from dialysis

## 2013-07-21 NOTE — Discharge Summary (Signed)
[facility]      Patient: Mary [name]  Admission Date: 07/19/2013   DOB: 06/28/1946  Discharge Date: 07/21/2013    MRN: [id number]  Discharge Attending: Evans Lance MD   Referring Physician: Alric Quan, MD  PCP: Alric Quan, MD       DISCHARGE SUMMARY     Discharge Information   Admission Diagnosis:   Chest Pain     Discharge Diagnosis:   Patient Active Problem List    Diagnosis Date Noted   . Viral pericarditis 07/21/2013   . Chest pain at rest 07/19/2013   . Cellulitis of left leg 06/21/2013   . ESRD (end stage renal disease) 06/21/2013   . DM (diabetes mellitus) 06/21/2013   . Hypertension 06/21/2013   . Cellulitis 06/21/2013        Admission Condition: fair  Discharge Condition: stable  Functional Status: Patient is independent with mobility/ambulation, transfers, ADL's, IADL's.    Discharge Medications:     Medication List       As of 07/21/2013  6:12 PM      START taking these medications           ibuprofen 600 MG tablet    Commonly known as: ADVIL,MOTRIN    Take 1 tablet (600 mg total) by mouth 3 (three) times daily with meals.         CONTINUE taking these medications           acetaminophen 500 MG tablet    Commonly known as: TYLENOL        amLODIPine 10 MG tablet    Commonly known as: NORVASC        aspirin 81 MG tablet        atorvastatin 10 MG tablet    Commonly known as: LIPITOR        b complex-vitamin c-folic acid 0.8 MG Tabs        bisacodyl 5 MG EC tablet    Commonly known as: DULCOLAX        esomeprazole 40 MG capsule    Commonly known as: NexIUM        insulin aspart 100 UNIT/ML injection    Commonly known as: NovoLOG        insulin glargine 100 UNIT/ML injection    Commonly known as: LANTUS        pregabalin 75 MG capsule    Commonly known as: LYRICA        traMADol 50 MG tablet    Commonly known as: ULTRAM    Take 1 tablet (50 mg total) by mouth every 6 (six) hours as needed for Pain.        warfarin 5 MG tablet    Commonly known as: COUMADIN               Where  to get your medications     These are the prescriptions that you need to pick up.    You may get these medications from any pharmacy.           ibuprofen 600 MG tablet                       Hospital Course   Presentation History   Mary [name] is a 67 y.o. female with a PMHx of ESRD, IDDM, recent hospitalization for left lower extremity clot on coumadin who presented localized substernal chest pain at rest last night  See HPI for details.    Hospital Course (2 Days)     1. Chest Pain/Moderate Pericardial Effusion/Viral Pericarditis: pt admitted to telemetry for close cardiac monitoring. ECG and trop upon admission unremarkable except for mild, diffuse ST changes. CTA chest showed evolving pericardial effusion described ad moderate. Pt seen by cardiology who felt symptoms and pericardial effusion due to recent URI. She was started on ibuprofen 600 mg q6 with resolution of CP symptoms. Due to the size no surgical intervention necessary. Admission ECHO showed small pericardial effusion w/o tamponade. She was discharged on 7 days worth of ibuprofen 600q6hr and a repeat ECHO in one week's time with Dr. Franchot Heidelberg, cardiology.  2.   3. HTN: fairly well controlled. Elevated and started on amlodipine 2.5. Upon discharge pt noted to have BP's in the 90's during dialysis. New amlodipine held, but home anti-HTN's resumed.  4. IDDM: stable.  5. ESRD on HD (TTS): HD MGMT as per Dr. Leonette Most, Gwenith Daily, MD, had HD upon D/C  6. 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 Chronic Peripheral Neuropathy: continue gabapentin  8. HLD: continue lipitor 10    Procedures/Imaging:   XR CHEST AP PORTABLE    Final Result:  Hypoinflation with minimal basilar atelectasis.         Sheria Lang, MD     07/19/2013 2:45 PM   CT ANGIOGRAM CHEST    Final Result:      1. No CT scan evidence for detectable pulmonary embolism.    2. Minimal cardiomegaly.    3. Minimal bibasilar lung atelectasis.    4. Remainder as above.        Sheria Lang, MD     07/19/2013 3:18  PM   CT ABDOMEN PELVIS WO IV/ WO PO CONT    Final Result:      1. Interval development of a minimal to moderate pericardial effusion    and trace pleural effusions since prior exam one day earlier.    2. No detectable acute abnormalities in the abdomen or pelvis.    3. Contrast residue gallbladder, kidneys, and bladder.    4. Numerous small low-attenuation kidney lesions.    5. Remainder as above.    6. Findings discussed with Dr. Beryle Lathe by Dr. Oren Binet at 3:59 AM.        Sheria Lang, MD     07/20/2013 8:23 AM   ECHOCARDIOGRAM ADULT COMPLETE W CLR/ DOPP WAVEFORM    Final Result:      Normal left ventricular systolic function.    No significant valvular heart disease.    Small pericardial effusion without evidence of cardiac tamponade.          Audria Nine, MD     07/20/2013 4:09 PM   ECHOCARDIOGRAM ADULT COMP W CLR/ DOPP WV INC BUBBLE STDY    (Results Pending)       Treatment Team:   Attending Provider: Laroy Apple, MD  Consulting Physician: Laroy Apple, MD  Consulting Physician: Lottie Dawson, MD  Consulting Physician: Oris Drone, NP       Best Practices   Was the patient admitted with either a CHF Exacerbation or Pneumonia? No     Progress Note/Physical Exam at Discharge     Subjective: denies CP or SOB, no fevers, no cough    Filed Vitals:    07/21/13 1700 07/21/13 1715 07/21/13 1745 07/21/13 1800   BP: 89/56 87/61 77/60  97/60   Pulse:  108 108 116 111   Temp:       TempSrc:       Resp: 16 16 16 16    Height:       Weight:       SpO2:           General: NAD, AAOx3  HEENT: perrla, eomi, sclera anicteric, OP: Clear, MMM  Neck: supple, FROM, no LAD  Cardiovascular: RRR, no m/r/g  Lungs: CTAB, no w/r/r  Abdomen: soft, +BS, NT/ND, no masses, no g/r  Extremities: no C/C/E  Skin: no rashes or lesions noted       Diagnostics     Labs/Studies Pending at Discharge: No    Last Labs     Lab 07/21/13 0712 07/19/13 1304   WBC 15.31* 10.54   RBC 4.27 3.78*   HGB 13.1 11.7*   HCT 38.8 34.7*   MCV  90.9 91.8   PLT 157 158         Lab 07/19/13 1303   NA 133*   K 4.4   CL 93*   CO2 26   BUN 20*   CREAT 3.3*   GLU 187*   CA 10.0   MG --        Patient Instructions   Discharge Diet: renal diet  Discharge Activity:  activity as tolerated    Follow Up Appointment:  Follow-up Information     Follow up with Alric Quan, MD .    Contact information:    [address]  [place] [address]  [phone]                 Time spent examining patient, discussing with patient/family regarding hospital course, chart review, reconciling medications and discharge planning: 45 minutes.    Signed,  Evans Lance, MD  6:12 PM 07/21/2013

## 2013-07-21 NOTE — Progress Notes (Signed)
Dialysis Treatment Note    Patient:  Mary [name] MRN#:  [id number]  Unit/Room/Bed:  [id number]   Isolation: None       Allergies:   Allergies   Allergen Reactions   . Penicillins Hives   . Shrimp (Shellfish Allergy) Swelling     Code Status: Prior    Problem List:   Patient Active Problem List    Diagnosis Date Noted   . Viral pericarditis 07/21/2013   . Chest pain at rest 07/19/2013   . Cellulitis of left leg 06/21/2013   . ESRD (end stage renal disease) 06/21/2013   . DM (diabetes mellitus) 06/21/2013   . Hypertension 06/21/2013   . Cellulitis 06/21/2013       Dialysis Order:  Active Orders   Dialysis    Hemodialysis inpatient     Frequency: Once     Start Date/Time: 07/21/13 1635     Number of Occurrences:  1 Occurrences     Order Questions:     . Date of Dialysis 07/21/2013     . K+ (Initial) 2 mEq     . KPP (Per Protocol) Yes     . Ca++ 2.5 mEq     . Bicarb 35 mEq     . Na+ 138 mEq     . Dialyzer F160     . Dialysate Temperature (C) 37     . BFR-As tolerated to a maximum of: 350 mL/min     . DFR 700 mL/min     . Duration of Treatment 3.5 Hours     . Fluid Removal (L) and Dry Weight (Kg) Other (please specify) (Comment: 1.5L as tolerated)        Dialysis Treatment Type:    Time out/Safety Check: Time Out/Safety Check Completed: Yes (07/21/13 1530)  [facility] Consent: Consent for HD signed for this hospitalization: Yes (07/21/13 1530)  Blood Consent (if applicable): Blood Consent Verified: Not Applicable ([id number])    Dialysis Access:           General Assessments:       07/21/13 1530   Bedside Nurse Communication   Name of bedside RN - pre dialysis Mitra RN   Treatment Initiation- With Dialysis Precautions   Time Out/Safety Check Completed Yes   Consent for HD signed for this hospitalization Y   Blood Consent Verified N/A   Dialysis Precautions All Connections Secured;Saline Line Double Clamped;Venous Parameters Set;Arterial Parameters Set;Air Foam Detecctor Engaged   Dialysis Treatment Type  Bedside   Is patient diabetic? Yes   RO/Hemodialysis Architectural technologist   Is Total Chlorine less than 0.1 ppm? Yes   Orignial Total Chlorine Testing Time 1530   RO/Hemodialysis Archivist Number 1    RO # 1    Water Hardness 0    pH 7.4    Pressure Test Verified Yes   Alarms Verified Passed   Machine Temperature 98.6 F (37 C)   Alarms Verified Yes   Hemodialysis Conductivity (Machine) 13.8    Hemodialysis Conductivity (Meter) 13.8    Dialyzer Lot Number [id number])   Tubing Lot Number [id number])   RO Machine Log Completed Yes   Hepatitis Status   HBsAg (Antigen) Result Unknown   Dialysis Weight   Pre-Treatment Weight (Kg) 91.1    Scale Type In Bed Sling Scale   Vitals   Temp 98.4 F (36.9 C)   Heart Rate ! 111    Resp Rate 18  BP 102/64 mmHg   SpO2 97 %   O2 Device Nasal cannula  (2L)   Assessment   Mental Status Alert;Oriented;Cooperative;Follows Commands   Cardiac (WDL) WDL   Cardiac Regularity Regular   Cardiac Rhythm Normal Sinus Rhythm   Respiratory Pattern Dyspnea with exertion   Bilateral Breath Sounds Clear;Diminished   General Skin Color Appropriate for ethnicity   Mobility Ambulatory with Assistance   Pain Assessment   Charting Type Assessment   Pain Assessment Numeric Scale (0-10)   Pain Score 0   Hemodialysis Comments   Pre-Hemodialysis Comments Dr Leonette Most; 1.5L as tolerated due to low SBP   Hemodialysis History Information   Outpatient Dialysis Unit [facility]   Outpatient Dialysis Schedule TTS   Outpatient Nephrologist Dr Leonette Most      07/21/13 1940   Treatment Summary   Time Off Machine 1940   Duration of Treatment (Hours) 3.5   Dialyzer Clearance Lightly streaked   Fluid Volume Off (mL) 1900    Prime Volume (mL) 200    Rinseback Volume (mL) 200    Fluid Given: Normal Saline (mL) 0    Fluid Given: PRBC  0 mL   Fluid Given: Albumin (mL) 0    Fluid Given: Other (mL) 0    Total Fluid Given 400    Hemodialysis Net Fluid Removed 1500    Post Treatment Assessment    Post-Treatment Weight (Kg) 87.8    Patient Response to Treatment pt had SBP <100 during HD Tx; MD Aware reduced UFG   Additional Dialyzer Used NA   Graft/Fistula Left   Removal Date/Time: 07/21/13 1858  No Placement Date or Time found.   Present on Admission?: Yes  Orientation: Left  Graft/Fistula Location: Upper Arm   Fistula/ Graft Assessment Abnormalities WDL   Cannulation Sites held Arterial (min) 10   Cannulation Sites held Venous (min) 10   Hemostasis Achieved Yes   Vitals   Temp 97.1 F (36.2 C)   Heart Rate ! 109    Resp Rate 18    BP 118/61 mmHg   SpO2 98 %   O2 Device Nasal cannula  (2L)   Assessment   Mental Status Alert;Oriented;Cooperative;Follows Commands   Cardiac (WDL) WDL   Cardiac Regularity Regular   Cardiac Rhythm Normal Sinus Rhythm   Respiratory Pattern Regular;Unlabored   Bilateral Breath Sounds Clear;Diminished   General Skin Color Appropriate for ethnicity   Gastrointestinal (WDL) WDL   Mobility Ambulatory with Assistance   Education   Person taught Patient   Knowledge basis Minimal   Topics taught Access care;S&S of infection;Procedure;Tx Options   Teaching Tools Explain   Bedside Nurse Communication   Name of bedside RN - post dialysis Jonelle Sidle RN           Pain Assessment: Pain Assessment  Charting Type: Assessment (07/21/13 1530)  Pain Assessment: Numeric Scale (0-10) (07/21/13 1530)  Pain Score: 0-No pain (07/21/13 1530)  POSS Score: Slightly drowsy, easily aroused (07/21/13 0717)  Pain Location: Shoulder (07/20/13 0826)  Pain Orientation: Right (07/20/13 0826)  Pain Descriptors: Aching (07/20/13 0826)  Pain Frequency: Intermittent (07/20/13 0826)  Effect of Pain on Daily Activities: severe (07/19/13 1830)  Patient's Stated Comfort Functional Goal: 2-mild pain (07/20/13 0826)  Pain Intervention(s): Repositioned (07/20/13 0826)    Labs Values:    HBsAg Result:HBsAg (Antigen) Result: Unknown (07/21/13 1530)  HBsAg Date Drawn:    HBsAg Next Due Date:     Lab Results   Component Value Date  BUN 20* 07/19/2013    NA 133* 07/19/2013    K 4.4 07/19/2013    CREAT 3.3* 07/19/2013    CO2 26 07/19/2013    CA 10.0 07/19/2013    PHOS 5.7* 06/21/2013    GLU 187* 07/19/2013    ALB 2.7* 06/21/2013    HGB 13.1 07/21/2013    HCT 38.8 07/21/2013    WBC 15.31* 07/21/2013    PLT 157 07/21/2013    PTT 43* 07/19/2013    PT 20.8* 07/19/2013    INR 1.8* 07/19/2013         Current Diet Order  Diet consistent carbohydrate and renal 80 GM Protein     RO/Hemodialysis Machine Safety Checks - Before Each Treatment:  RO/Hemodialysis Archivist Number: 1  (07/21/13 1530)  RO #: 1  (07/21/13 1530)  Water Hardness: 0  (07/21/13 1530)  pH: 7.4  (07/21/13 1530)  Pressure Test Verified: Yes (07/21/13 1530)  Alarms Verified: Passed (07/21/13 1530)  Machine Temperature: 98.6 F (37 C) (07/21/13 1530)  Alarms Verified: Yes (07/21/13 1530)  Hemodialysis Conductivity (Machine): 13.8  (07/21/13 1530)  Hemodialysis Conductivity (Meter): 13.8  (07/21/13 1530)  RO Machine Log Completed: Yes (07/21/13 1530)    Chlorine Testing:  RO/Hemodialysis Machine Safety Checks  Is Total Chlorine less than 0.1 ppm?: Yes (07/21/13 1530)  Orignial Total Chlorine Testing Time: [id number] (07/21/13 1530)    Vitals and Weight:  Patient Vitals for the past 4 hrs:   BP Temp Pulse Resp   07/21/13 1940 118/61 mmHg 97.1 F (36.2 C) 109  18    07/21/13 1930 92/60 mmHg - 110  18    07/21/13 1915 98/64 mmHg - 114  18    07/21/13 1900 101/68 mmHg - 112  18    07/21/13 1845 92/62 mmHg - 110  18    07/21/13 1830 104/58 mmHg - 110  16    07/21/13 1815 97/72 mmHg - 108  16    07/21/13 1800 97/60 mmHg - 111  16    07/21/13 1745 77/60 mmHg - 116  16    07/21/13 1715 87/61 mmHg - 108  16    07/21/13 1700 89/56 mmHg - 108  16    07/21/13 1645 83/61 mmHg - 105  16    07/21/13 1630 91/52 mmHg - 108  18    07/21/13 1618 83/59 mmHg 98.8 F (37.1 C) 105  18    07/21/13 1610 108/62 mmHg - 107  18         Dialysis Weight  Pre-Treatment Weight (Kg): 91.1  (07/21/13 1530)  Scale Type:  In Bed Sling Scale (07/21/13 1530)  Post-Treatment Weight (Kg): 87.8  (07/21/13 1940)    Treatment:     07/21/13 1610   Graft/Fistula Left   Removal Date/Time: 07/21/13 1858  No Placement Date or Time found.   Present on Admission?: Yes  Orientation: Left  Graft/Fistula Location: Upper Arm   Fistula/ Graft Assessment Abnormalities WDL   Needle Size 15 Gauge   Vitals   Heart Rate ! 107    Resp Rate 18    BP 108/62 mmHg   SpO2 100 %   Machine Metrics   $Treatment Started/Capturing Charge Yes   Blood Flow Rate (mL/min) 350 mL/min   Arterial Pressure (mmHg) -160 mmHg   Venous Pressure (mmHg) 130    Dialysate Flow Rate (mL/min) 700 mL/min   Transmembrane Pressure (mmHg) 90 mmHg   Ultrafiltration Rate (mL/Hr) 400 mL/hr  Fluid Removal (ml) 0 ml   Fluid Bolus (ml) 200 ml   Dialysate K (mEq/L) 2 mEq/L   Dialysate CA (mEq/L) 2.5 mEq/L   Hemodialysis Comments   Arteriovenous Lines Secure Yes   Comments HD Tx Started      07/21/13 1630   Vitals   Heart Rate ! 108    Resp Rate 18    BP 91/52 mmHg   Machine Metrics   Blood Flow Rate (mL/min) 350 mL/min   Arterial Pressure (mmHg) -160 mmHg   Venous Pressure (mmHg) 130    Dialysate Flow Rate (mL/min) 700 mL/min   Transmembrane Pressure (mmHg) 100 mmHg   Ultrafiltration Rate (mL/Hr) 400 mL/hr   Fluid Removal (ml) 120 ml   Fluid Bolus (ml) 0 ml   Hemodialysis Comments   Arteriovenous Lines Secure Yes   Comments pt vss; pt awake playing on ipad      07/21/13 1645 07/21/13 1700 07/21/13 1715   Vitals   Heart Rate ! 105  ! 108  ! 108    Resp Rate 16  16  16     BP ! 83/61 mmHg ! 89/56 mmHg ! 87/61 mmHg   Machine Metrics   Blood Flow Rate (mL/min) 350 mL/min 350 mL/min 350 mL/min   Arterial Pressure (mmHg) -160 mmHg -170 mmHg -170 mmHg   Venous Pressure (mmHg) 140  140  140    Dialysate Flow Rate (mL/min) 700 mL/min 700 mL/min 700 mL/min   Transmembrane Pressure (mmHg) 100 mmHg 100 mmHg 100 mmHg   Ultrafiltration Rate (mL/Hr) 400 mL/hr 570 mL/hr 570 mL/hr   Fluid Removal (ml) 230 ml 380  ml 510 ml   Fluid Bolus (ml) 0 ml 0 ml 0 ml   Hemodialysis Comments   Arteriovenous Lines Secure Yes Yes Yes   Comments pt vss; family @ bedside; increase ufg pt vss; no complaints pt vss       07/21/13 1745   Vitals   Heart Rate ! 116    Resp Rate 16    BP ! 77/60 mmHg   Machine Metrics   Blood Flow Rate (mL/min) 350 mL/min   Arterial Pressure (mmHg) -180 mmHg   Venous Pressure (mmHg) 150    Dialysate Flow Rate (mL/min) 700 mL/min   Transmembrane Pressure (mmHg) 110 mmHg   Ultrafiltration Rate (mL/Hr) 600 mL/hr   Fluid Removal (ml) 790 ml   Fluid Bolus (ml) 0 ml   Hemodialysis Comments   Arteriovenous Lines Secure Yes   Comments pt vss; pt eating dinner; sitting up      07/21/13 1800 07/21/13 1815 07/21/13 1830   Vitals   Heart Rate ! 111  ! 108  ! 110    Resp Rate 16  16  16     BP 97/60 mmHg 97/72 mmHg 104/58 mmHg   Machine Metrics   Blood Flow Rate (mL/min) 350 mL/min 350 mL/min 350 mL/min   Arterial Pressure (mmHg) -170 mmHg -170 mmHg -170 mmHg   Venous Pressure (mmHg) 140  140  140    Dialysate Flow Rate (mL/min) 700 mL/min 700 mL/min 700 mL/min   Transmembrane Pressure (mmHg) 100 mmHg 100 mmHg 100 mmHg   Ultrafiltration Rate (mL/Hr) 320 mL/hr 630 mL/hr 630 mL/hr   Fluid Removal (ml) 880 ml 1040 ml 1200 ml   Fluid Bolus (ml) 0 ml 0 ml 0 ml   Hemodialysis Comments   Arteriovenous Lines Secure Yes Yes Yes   Comments pt vss; decreased ufg pt vss; md @ bedside;  increase ufg pt vss;        07/21/13 1845   Vitals   Heart Rate ! 110    Resp Rate 18    BP 92/62 mmHg   Machine Metrics   Blood Flow Rate (mL/min) 350 mL/min   Arterial Pressure (mmHg) -160 mmHg   Venous Pressure (mmHg) 140    Dialysate Flow Rate (mL/min) 700 mL/min   Transmembrane Pressure (mmHg) 100 mmHg   Ultrafiltration Rate (mL/Hr) 630 mL/hr   Fluid Removal (ml) 1360 ml   Fluid Bolus (ml) 0 ml   Hemodialysis Comments   Arteriovenous Lines Secure Yes   Comments pt vss; pt on phone      07/21/13 1900 07/21/13 1915 07/21/13 1930   Vitals   Temp --  --  --     Heart Rate ! 112  ! 114  ! 110    Resp Rate 18  18  18     BP 101/68 mmHg 98/64 mmHg 92/60 mmHg   SpO2 --  --  --    O2 Device --  --  --    Retail banker   Blood Flow Rate (mL/min) 350 mL/min 350 mL/min 350 mL/min   Arterial Pressure (mmHg) -170 mmHg -170 mmHg -170 mmHg   Venous Pressure (mmHg) 140  140  140    Dialysate Flow Rate (mL/min) 700 mL/min 700 mL/min 700 mL/min   Transmembrane Pressure (mmHg) 100 mmHg 100 mmHg 100 mmHg   Ultrafiltration Rate (mL/Hr) 650 mL/hr 650 mL/hr 650 mL/hr   Fluid Removal (ml) 1510 ml 1670 ml 1800 ml   Fluid Bolus (ml) 0 ml 0 ml 0 ml   Hemodialysis Comments   Arteriovenous Lines Secure Yes Yes Yes   Comments pt vss pt vss; pt watching tv pt vss       07/21/13 1940   Vitals   Temp 97.1 F (36.2 C)   Heart Rate ! 109    Resp Rate 18    BP 118/61 mmHg   SpO2 98 %   O2 Device Nasal cannula  (2L)   Machine Metrics   Blood Flow Rate (mL/min) --    Arterial Pressure (mmHg) --    Venous Pressure (mmHg) --    Dialysate Flow Rate (mL/min) --    Transmembrane Pressure (mmHg) --    Ultrafiltration Rate (mL/Hr) --    Fluid Removal (ml) 1900 ml   Fluid Bolus (ml) 200 ml   Hemodialysis Comments   Arteriovenous Lines Secure Yes   Comments HD Tx Completed            Intake/Output:  I/O this shift:  In: -   Out: 1500 [Other:1500]      Medications:  Current Facility-Administered Medications   Medication Dose Route Last Rate Last Dose   . [EXPIRED] heparin (porcine) injection 1,000 Units  1,000 Units Intracatheter       . heparin (porcine) injection 1,000 Units  1,000 Units Intracatheter   1,000 Units at 07/21/13 1610   . [EXPIRED] sodium chloride 0.9 % bolus 100 mL  100 mL Other       . [EXPIRED] sodium chloride 0.9 % bolus 250 mL  250 mL Intravenous            Education:  Education  Person taught: Patient (07/21/13 1940)  Knowledge basis: Minimal (07/21/13 1940)  Topics taught: Access care;S&S of infection;Procedure;Tx Options (07/21/13 1940)  Teaching Tools: Explain (07/21/13 1940)    Primary  Nurse Communication:  Bedside Nurse Communication  Name of bedside RN - pre dialysis: Rosalio Macadamia RN (07/21/13 1530)  Name of bedside RN - post dialysis: Jonelle Sidle RN (07/21/13 1940)      [facility] nurse signature/date/time:__________________________________________

## 2013-07-21 NOTE — Plan of Care (Signed)
Problem: Moderate/High Fall Risk Score >/=15  Goal: Patient will remain free of falls  Outcome: Progressing  Pt is lethargic,arousable open eyes, difficult to give medication, unable to make conversation  Bed alarms on   Sao2 monitoring continuous- 92-95% with O2 3 LPM    Problem: Potential for Compromised Skin Integrity  Goal: Skin integrity is maintained or improved  Assess and monitor skin integrity. Identify patients at risk for skin breakdown on admission and per policy. Collaborate with interdisciplinary team and initiate plans and interventions as needed.   Outcome: Progressing  Skin intact

## 2013-07-21 NOTE — Plan of Care (Signed)
Problem: Renal Failure  Goal: Fluid and electrolyte balance are achieved/maintained  Intervention: Monitor intake and output every shift  Pt assist to the bath room help to stay hydrated.

## 2013-07-21 NOTE — Progress Notes (Addendum)
[facility]  PROGRESS NOTE  [name]     Date Time: 07/21/2013 10:10 AM  Patient Name: Mary [name], Mary [name]  Medical Record #:  [id number]  Account#:  [id number]  Admission Date:  07/19/2013       Patient Active Problem List   Diagnosis   . Cellulitis of left leg   . ESRD (end stage renal disease)   . DM (diabetes mellitus)   . Hypertension   . Cellulitis   . Chest pain at rest       Subjective:   Denies chest pain, SOB or palpitations.     Assessment:   Probable viral pericarditis. 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 Continue ibuprofen TID with full stomach for at least a week for probable viral pericarditis.  2. Resume low dose amlodipine for hypertension.  3. Continue Coumadin with target INR of 2.0-3.0  4. For repeat limited echo in our office next week. We will arrange.  5. We will sign off.    Medications:      Scheduled Meds:           amLODIPine 2.5 mg Oral Daily   atorvastatin 10 mg Oral QHS   ibuprofen 600 mg Oral Q8H   insulin glargine 4 Units Subcutaneous QHS   lactulose 20 g Oral Q8H SCH   pantoprazole 40 mg Oral QAM AC   simethicone 80 mg Oral BID   warfarin 5 mg Oral Daily at 1800   [DISCONTINUED] aspirin 81 mg Oral Daily   [DISCONTINUED] cyclobenzaprine 10 mg Oral TID   [DISCONTINUED] gabapentin 300 mg Oral Q8H [facility]   [DISCONTINUED] pregabalin 75 mg Oral Q12H [place]       Continuous Infusions:          Physical Exam:     Filed Vitals:    07/21/13 0717   BP: 149/76   Pulse: 120   Temp: 97.1 F (36.2 C)   Resp: 22   SpO2: 99%     Telemetry : Sinus tachycardia 110 to 122 beats per minute.     Intake and Output Summary (Last 24 hours) at Date Time  No intake or output data in the 24 hours ending 07/21/13 1010    General Appearance:  Breathing comfortable, no acute distress  Head:   normocephalic  Eyes:  EOM's intact  Neck:  No carotid bruit or jugular venous distension, brisk carotid upstroke  Lungs:  Clear to auscultation throughout, no wheezes, rhonchi or rales, good respiratory effort   Chest Wall:  No tenderness or deformity  Heart:  S1, S2 normal, no S3, no S4, tachycardic  Abdomen:  Soft, non-tender, positive bowel sounds, no hepatojugular reflux  Extremities:  No cyanosis, clubbing or edema  Pulses:  Equal radial pulses, 4/4 symmetric  Neurologic:  Alert and oriented x3, mood and affect normal    Labs:        Lab 07/20/13 0755 07/20/13 0017 07/19/13 1817   CK 50 68 89   TROPI 0.04 0.01 0.02   TROPT -- -- --   CKMBINDEX -- -- --       Lab 07/19/13 1423   PT 20.8*   INR 1.8*   PTT 43*       Lab 07/21/13  [id number] 07/19/13 1304   WBC 15.31* 10.54   HGB 13.1 11.7*   HCT 38.8 34.7*   PLT 157 158       Lab 07/19/13 1303   NA 133*   K 4.4   CL 93*   CO2 26   BUN 20*   CREAT 3.3*   EGFR 16.8   GLU 187*   CA 10.0       Lab 07/20/13 0012   TSH 0.59   FREET3 --       Rads:   Radiological Procedure reviewed.                     Signed by: Oris Drone, NP        [facility]  NP [place] (8am-5pm)  MD [facility] [phone] (8am-5pm)  After hours, non urgent consult line [phone]  After Hours, urgent consults [phone]     Patient seen and examined, my assessment and plans as noted above. RRR    Signed by    Reina Fuse, MD

## 2013-07-21 NOTE — Progress Notes (Signed)
Mary [name] is a 67 y.o. female patient with PMH notable for ESRD on hemodialysis, DM, HTN, admitted because of chest pain.  .  Active Problems:   Chest pain at rest    Past Medical History   Diagnosis Date   . Diabetes mellitus without complication    . Hyperlipidemia    . Hypertensive disorder    . Chronic kidney disease      dialysis t-tr-sat   . Arthritis    . Cataracts, bilateral    . GERD (gastroesophageal reflux disease)    . Acute deep vein thrombosis (DVT) of distal vein of left lower extremity    . Hemodialysis access site with mature fistula      Current Facility-Administered Medications   Medication Dose Route Frequency Provider Last Rate Last Dose   . acetaminophen (TYLENOL) tablet 500 mg  500 mg Oral Q6H PRN Shariff, Imran, MD       . amLODIPine (NORVASC) tablet 2.5 mg  2.5 mg Oral Daily [place], [place] R, NP       . atorvastatin (LIPITOR) tablet 10 mg  10 mg Oral QHS Shariff, Imran, MD   10 mg at 07/20/13 2238   . bisacodyl (DULCOLAX) suppository 10 mg  10 mg Rectal QD PRN Shariff, Imran, MD       . dextrose (GLUCOSE) 40 % oral gel 15 g  15 g Oral PRN Shariff, Imran, MD       . dextrose 50 % bolus 25 mL  25 mL Intravenous PRN Shariff, Imran, MD       . diphenhydrAMINE (BENADRYL) capsule 25 mg  25 mg Oral QD PRN Shariff, Imran, MD       . glucagon (rDNA) (GLUCAGEN) injection 1 mg  1 mg Intramuscular PRN Shariff, Imran, MD       . heparin (porcine) injection 1,000 Units  1,000 Units Intracatheter PRN Alric Quan, MD       . ibuprofen (ADVIL,MOTRIN) tablet 600 mg  600 mg Oral Q8H Reina Fuse, MD   600 mg at 07/21/13 1000   . insulin aspart (NovoLOG) injection 1-5 Units  1-5 Units Subcutaneous TID AC PRN Shariff, Imran, MD   4 Units at 07/21/13 1224   . insulin glargine (LANTUS) injection 4 Units  4 Units Subcutaneous QHS Shariff, Imran, MD   4 Units at 07/20/13 2238   . lactulose (CHRONULAC) 10 GM/15ML solution 20 g  20 g Oral Q8H [place] Shariff, Imran, MD   20 g at 07/21/13 0557   .  nitroglycerin (NITROSTAT) SL tablet 0.4 mg  0.4 mg Sublingual Q5 Min PRN Laroy Apple, MD   0.4 mg at 07/19/13 1759   . nitroglycerin (NITROSTAT) SL tablet 0.4 mg  0.4 mg Sublingual Q5 Min PRN Shariff, Imran, MD       . pantoprazole (PROTONIX) EC tablet 40 mg  40 mg Oral QAM AC Shariff, Imran, MD   40 mg at 07/21/13 0845   . promethazine (PHENERGAN) injection 12.5 mg  12.5 mg Intravenous Q6H PRN Shariff, Imran, MD       . simethicone (MYLICON) chewable tablet 80 mg  80 mg Oral BID Shariff, Imran, MD   80 mg at 07/21/13 1053   . sodium chloride 0.9 % bolus 100 mL  100 mL Other Q1H PRN Alric Quan, MD       . sodium chloride 0.9 % bolus 250 mL  250 mL Intravenous PRN Alric Quan, MD       .  warfarin (COUMADIN) tablet 5 mg  5 mg Oral Daily at 1800 Shariff, Imran, MD   5 mg at 07/20/13 1804   . [DISCONTINUED] aspirin chewable tablet 81 mg  81 mg Oral Daily Shariff, Imran, MD   81 mg at 07/20/13 1015     Allergies   Allergen Reactions   . Penicillins Hives   . Shrimp (Shellfish Allergy) Swelling     Blood pressure 96/65, pulse 117, temperature 100.1 F (37.8 C), temperature source Oral, resp. rate 19, height 1.6 m (5\' 3" ), weight 81.647 kg (180 lb), SpO2 95.00%.    Subjective:  Symptoms:  She reports shortness of breath, chest pain, weakness and chest pressure.    Diet:  She reports  nausea.    Activity level: Impaired due to weakness.    Pain:  She complains of pain that is moderate.      Objective:  General Appearance:  In no acute distress.    Vital signs: (most recent): Blood pressure 96/65, pulse 117, temperature 100.1 F (37.8 C), temperature source Oral, resp. rate 19, height 1.6 m (5\' 3" ), weight 81.647 kg (180 lb), SpO2 95.00%.    Output: No urine output and producing stool.    Lungs:  There are decreased breath sounds.    Heart: Tachycardia.    Extremities: There is local extremity swelling.    Abdomen: Abdomen is soft.  There is generalized tenderness.       Results     Procedure  Component Value Units Date/Time    POCT glucose (AC and HS) [id number]  (Abnormal) Collected:07/21/13 1140     POCT Glucose WB 326 (A) mg/dL Updated:07/21/13 1140    CBC without differential [id number]  (Abnormal) Collected:07/21/13 0712    Specimen Information:Blood / Blood Updated:07/21/13 0735     WBC 15.31 (H)      RBC 4.27      Hgb 13.1 g/dL      Hematocrit 38.8 %      MCV 90.9 fL      MCH 30.7 pg      MCHC 33.8 g/dL      RDW 13 %      Platelets 157      MPV 11.9 fL      Nucleated RBC 0     POCT glucose (AC and HS) [id number]  (Abnormal) Collected:07/21/13 0607     POCT Glucose WB 216 (A) mg/dL Updated:07/21/13 0610    POCT glucose (AC and HS) [id number]  (Abnormal) Collected:07/20/13 2138     POCT Glucose WB 355 (A) mg/dL Updated:07/20/13 2139    POCT glucose (AC and HS) [id number]  (Abnormal) Collected:07/20/13 1620     POCT Glucose WB 165 (A) mg/dL Updated:07/20/13 1620    TSH [id number] Collected:07/20/13 0012    Specimen Information:Blood Updated:07/20/13 1558     Thyroid Stimulating Hormone 0.59       Echocardiogram Adult Complete W Color Doppler Waveform    07/20/2013   Normal left ventricular systolic function. No significant valvular heart disease. Small pericardial effusion without evidence of cardiac tamponade.    Audria Nine, MD  07/20/2013 4:09 PM     Ct Abdomen Pelvis Wo Iv/ Wo Po Cont    07/20/2013   1. Interval development of a minimal to moderate pericardial effusion and trace pleural effusions since prior exam one day earlier. 2. No detectable acute abnormalities in the abdomen or pelvis. 3. Contrast residue gallbladder, kidneys, and bladder. 4. Numerous small low-attenuation kidney lesions. 5. Remainder as above.  6. Findings discussed with Dr. Beryle Lathe by Dr. Oren Binet at 3:59 AM.  Sheria Lang, MD  07/20/2013 8:23 AM     Ct Angio Chest    07/19/2013   1. No CT scan evidence for detectable pulmonary embolism. 2. Minimal cardiomegaly. 3. Minimal bibasilar lung atelectasis. 4. Remainder as  above.  Sheria Lang, MD  07/19/2013 3:18 PM     Xr Chest Ap Portable    07/19/2013   Hypoinflation with minimal basilar atelectasis.  Sheria Lang, MD  07/19/2013 2:45 PM     Assessment:  (1.chest pain  2.ESRD  3.HTN  4.DM).       Plan:   (1.Hemodialysis today  2.monitor serum electrolytes).       Alric Quan  07/21/2013

## 2013-07-21 NOTE — Discharge Instructions (Signed)
Diet consistent carbohydrate  Activity as tolerated  Diagnosis chest pain      Recognizing a Heart Attack or Angina  If you have risk factors for heart problems, you should always be on the lookout for signs of angina or a heart attack. If you have a sudden heart problem, getting treatment right away could save your life.    Understanding angina and heart attack   Anginais a painful burning, tightness, or pressure in the chest, back, neck, throat, or jaw. It signals a lowered amount of blood flow to the heart, most commonly explained by a blockage in a heart artery.Angina is a signthat you may be having, or are about to have, aheart attack.   A heart attack, also known as acute myocardial infarction, or AMI, is what happens when blood cannot get to part of the heartmuscle. Part of the heart muscle then dies. Though many people survive, a heart attack can be deadly. It is vital to get help as soon as possible for a heart attack.  Stable angina versus unstable angina  Stable angina, also known as chronic angina, has a typical pattern. It occurs predictably with physical exertion or strong emotion. Symptoms are easily relieved by rest and nitroglycerin or both. Angina symptoms will most likely feel the same time each time you have them. It is important to discuss these symptoms with your doctor as they can be a warning sign for a future heart attack.  Unstable angina causes unexpected or unpredictable symptoms, commonly occuring at rest, and is a medical emergency. Angina is also considered unstable if resting and nitroglycerin don't provide symptom relief or if symptoms are worsening, occuring more frequently and lasting longer. These symptoms suggest a severe blockage or a spasm of a heart artery. Unstable angina is commonly a sign of an active heart attack. Remember the following tips:   Stable angina symptoms last for only a few minutes. If they last for longer than a few minutes, or go away and come back,  you may be having a heart attack. Call 911!   Stable angina symptoms should go away with rest or medication. If they don't go away, call 911!   If you have shortness of breath, cold sweat, nausea, or lightheadedness, call 911!  For new-onset angina, there is only one response: ?call 911! You should never "diagnose" angina by yourself. If these symptoms are new, or worse than usual, call 911!  Warning signs of a heart attack  If you have symptoms that you can't explain, call 911 right away. The following are warning signs of a possible heart attack:   Chest discomfort.Most heart attacks involve discomfort in the center of the chest that lasts more than a few minutes, or that goes away and comes back. It can feel like uncomfortable pressure, squeezing, fullness or pain.   Discomfort in other areas of the upper body.Symptoms can include pain or discomfort in one or both arms, the back, neck, jaw, or stomach.   Shortness of breathwith or without chest discomfort.   Other signsmay include breaking out in a cold sweat, nausea, or lightheadedness.  Note for women:Like men, women commonly have chest pain or discomfort as a heart attack symptom. But women are somewhat more likely than men to have other common symptoms, particularly shortness of breath, nausea and vomiting, back pain, or jaw pain.  If you have diabetes: silent heart problems  Over time, high blood sugar can damage nerves in your body. This may keep  you from feeling pain caused by a heart problem, leading to a "silent" heart problem. If you don't feel symptoms, you are less able to get treatment right away. Talk to your health care provider about how to lower your risk for silent heart problems.    [address], [address] [place] [address]. All rights reserved. This information is not intended as a substitute for professional medical care. Always follow your healthcare professional's instructions.

## 2013-07-22 NOTE — Progress Notes (Signed)
Pt leaving at this time verbal D/C instructions have been given to patient and daughter who is POA at bedside after dialysis. No noted resp , or cardiac distress. Pt taken down to car via W/C by [employer].

## 2013-07-30 ENCOUNTER — Inpatient Hospital Stay
Admission: EM | Admit: 2013-07-30 | Discharge: 2013-08-02 | DRG: 314 | Disposition: A | Discharge status: Home / Self Care | Payer: Medicare Other | Attending: Internal Medicine | Admitting: Internal Medicine

## 2013-07-30 ENCOUNTER — Inpatient Hospital Stay: Payer: Medicare Other | Admitting: Internal Medicine

## 2013-07-30 ENCOUNTER — Emergency Department: Payer: Medicare Other

## 2013-07-30 DIAGNOSIS — Z794 Long term (current) use of insulin: Secondary | ICD-10-CM

## 2013-07-30 DIAGNOSIS — E1149 Type 2 diabetes mellitus with other diabetic neurological complication: Secondary | ICD-10-CM | POA: Diagnosis present

## 2013-07-30 DIAGNOSIS — I5033 Acute on chronic diastolic (congestive) heart failure: Secondary | ICD-10-CM | POA: Diagnosis present

## 2013-07-30 DIAGNOSIS — M129 Arthropathy, unspecified: Secondary | ICD-10-CM | POA: Diagnosis present

## 2013-07-30 DIAGNOSIS — R0602 Shortness of breath: Secondary | ICD-10-CM | POA: Diagnosis present

## 2013-07-30 DIAGNOSIS — N186 End stage renal disease: Secondary | ICD-10-CM | POA: Diagnosis present

## 2013-07-30 DIAGNOSIS — Z7901 Long term (current) use of anticoagulants: Secondary | ICD-10-CM

## 2013-07-30 DIAGNOSIS — E669 Obesity, unspecified: Secondary | ICD-10-CM | POA: Diagnosis present

## 2013-07-30 DIAGNOSIS — D638 Anemia in other chronic diseases classified elsewhere: Secondary | ICD-10-CM | POA: Diagnosis present

## 2013-07-30 DIAGNOSIS — I300 Acute nonspecific idiopathic pericarditis: Principal | ICD-10-CM | POA: Diagnosis present

## 2013-07-30 DIAGNOSIS — Z7982 Long term (current) use of aspirin: Secondary | ICD-10-CM

## 2013-07-30 DIAGNOSIS — K219 Gastro-esophageal reflux disease without esophagitis: Secondary | ICD-10-CM | POA: Diagnosis present

## 2013-07-30 DIAGNOSIS — Z992 Dependence on renal dialysis: Secondary | ICD-10-CM

## 2013-07-30 DIAGNOSIS — Z880 Allergy status to penicillin: Secondary | ICD-10-CM

## 2013-07-30 DIAGNOSIS — J90 Pleural effusion, not elsewhere classified: Secondary | ICD-10-CM | POA: Diagnosis present

## 2013-07-30 DIAGNOSIS — I509 Heart failure, unspecified: Secondary | ICD-10-CM | POA: Diagnosis present

## 2013-07-30 DIAGNOSIS — I825Y9 Chronic embolism and thrombosis of unspecified deep veins of unspecified proximal lower extremity: Secondary | ICD-10-CM | POA: Diagnosis present

## 2013-07-30 DIAGNOSIS — R079 Chest pain, unspecified: Secondary | ICD-10-CM

## 2013-07-30 DIAGNOSIS — E785 Hyperlipidemia, unspecified: Secondary | ICD-10-CM | POA: Diagnosis present

## 2013-07-30 DIAGNOSIS — Z6834 Body mass index (BMI) 34.0-34.9, adult: Secondary | ICD-10-CM

## 2013-07-30 DIAGNOSIS — H269 Unspecified cataract: Secondary | ICD-10-CM | POA: Diagnosis present

## 2013-07-30 DIAGNOSIS — E1142 Type 2 diabetes mellitus with diabetic polyneuropathy: Secondary | ICD-10-CM | POA: Diagnosis present

## 2013-07-30 DIAGNOSIS — Z91013 Allergy to seafood: Secondary | ICD-10-CM

## 2013-07-30 DIAGNOSIS — E871 Hypo-osmolality and hyponatremia: Secondary | ICD-10-CM | POA: Diagnosis present

## 2013-07-30 DIAGNOSIS — I120 Hypertensive chronic kidney disease with stage 5 chronic kidney disease or end stage renal disease: Secondary | ICD-10-CM | POA: Diagnosis present

## 2013-07-30 LAB — CBC AND DIFFERENTIAL
Basophils Absolute Automated: 0.02 (ref 0.00–0.20)
Basophils Automated: 0 %
Eosinophils Absolute Automated: 0.13 (ref 0.00–0.70)
Eosinophils Automated: 1 %
Hematocrit: 24.9 % — ABNORMAL LOW (ref 37.0–47.0)
Hgb: 8.4 g/dL — ABNORMAL LOW (ref 12.0–16.0)
Immature Granulocytes Absolute: 0.04
Immature Granulocytes: 0 %
Lymphocytes Absolute Automated: 1.9 (ref 0.50–4.40)
Lymphocytes Automated: 14 %
MCH: 30.4 pg (ref 28.0–32.0)
MCHC: 33.7 g/dL (ref 32.0–36.0)
MCV: 90.2 fL (ref 80.0–100.0)
MPV: 10.2 fL (ref 9.4–12.3)
Monocytes Absolute Automated: 0.9 (ref 0.00–1.20)
Monocytes: 7 %
Neutrophils Absolute: 10.29 — ABNORMAL HIGH (ref 1.80–8.10)
Neutrophils: 78 %
Nucleated RBC: 0 (ref 0–1)
Platelets: 304 (ref 140–400)
RBC: 2.76 — ABNORMAL LOW (ref 4.20–5.40)
RDW: 14 % (ref 12–15)
WBC: 13.24 — ABNORMAL HIGH (ref 3.50–10.80)

## 2013-07-30 LAB — BASIC METABOLIC PANEL
Anion Gap: 11 (ref 5.0–15.0)
BUN: 26 mg/dL — ABNORMAL HIGH (ref 7–19)
CO2: 26 (ref 22–29)
Calcium: 8.6 mg/dL (ref 8.5–10.5)
Chloride: 96 — ABNORMAL LOW (ref 98–107)
Creatinine: 4.6 mg/dL — ABNORMAL HIGH (ref 0.6–1.0)
Glucose: 236 mg/dL — ABNORMAL HIGH (ref 70–100)
Potassium: 3.9 (ref 3.5–5.1)
Sodium: 133 — ABNORMAL LOW (ref 136–145)

## 2013-07-30 LAB — GFR: EGFR: 11.4

## 2013-07-30 LAB — POCT GLUCOSE: Whole Blood Glucose POCT: 198 mg/dL — AB (ref 70–100)

## 2013-07-30 LAB — CK: Creatine Kinase (CK): 57 U/L (ref 29–168)

## 2013-07-30 LAB — TROPONIN I: Troponin I: <0.01 ng/mL (ref 0.00–0.09)

## 2013-07-30 MED ORDER — AMLODIPINE BESYLATE 5 MG PO TABS
10.0000 mg | ORAL_TABLET | Freq: Every day | ORAL | Status: DC
Start: 2013-07-31 — End: 2013-07-31

## 2013-07-30 MED ORDER — ASPIRIN 81 MG PO CHEW
81.00 mg | CHEWABLE_TABLET | Freq: Every day | ORAL | Status: DC
Start: 2013-07-31 — End: 2013-07-31

## 2013-07-30 MED ORDER — FUROSEMIDE 10 MG/ML IJ SOLN
80.0000 mg | Freq: Once | INTRAMUSCULAR | Status: AC
Start: 2013-07-30 — End: 2013-07-30
  Administered 2013-07-30: 80 mg via INTRAVENOUS
  Filled 2013-07-30: qty 8

## 2013-07-30 MED ORDER — INSULIN ASPART 100 UNIT/ML SC SOLN
1.0000 [IU] | Freq: Three times a day (TID) | SUBCUTANEOUS | Status: DC | PRN
Start: 2013-07-30 — End: 2013-08-02
  Administered 2013-07-30: 1 [IU] via SUBCUTANEOUS
  Administered 2013-07-31: 5 [IU] via SUBCUTANEOUS
  Administered 2013-07-31: 3 [IU] via SUBCUTANEOUS
  Administered 2013-07-31 – 2013-08-01 (×2): 4 [IU] via SUBCUTANEOUS
  Administered 2013-08-01: 3 [IU] via SUBCUTANEOUS
  Administered 2013-08-01: 1 [IU] via SUBCUTANEOUS
  Administered 2013-08-01: 4 [IU] via SUBCUTANEOUS
  Administered 2013-08-02: 3 [IU] via SUBCUTANEOUS
  Filled 2013-07-30: qty 50
  Filled 2013-07-30: qty 40

## 2013-07-30 MED ORDER — CYCLOBENZAPRINE HCL 10 MG PO TABS
10.00 mg | ORAL_TABLET | Freq: Three times a day (TID) | ORAL | Status: DC | PRN
Start: 2013-07-30 — End: 2013-08-02

## 2013-07-30 MED ORDER — IBUPROFEN 600 MG PO TABS
600.0000 mg | ORAL_TABLET | Freq: Four times a day (QID) | ORAL | Status: DC
Start: 2013-07-31 — End: 2013-07-31
  Administered 2013-07-31: 600 mg via ORAL
  Filled 2013-07-30: qty 1

## 2013-07-30 MED ORDER — BISACODYL 5 MG PO TBEC
5.00 mg | DELAYED_RELEASE_TABLET | Freq: Every day | ORAL | Status: DC | PRN
Start: 2013-07-30 — End: 2013-08-02
  Administered 2013-08-01: 5 mg via ORAL
  Filled 2013-07-30: qty 1

## 2013-07-30 MED ORDER — SODIUM CHLORIDE 0.9 % IJ SOLN
3.0000 mL | Freq: Three times a day (TID) | INTRAMUSCULAR | Status: DC
Start: 2013-07-30 — End: 2013-08-02

## 2013-07-30 MED ORDER — GLUCOSE 40 % PO GEL
15.0000 g | ORAL | Status: DC | PRN
Start: 2013-07-30 — End: 2013-08-02

## 2013-07-30 MED ORDER — INSULIN GLARGINE 100 UNIT/ML SC SOLN
7.0000 [IU] | Freq: Every evening | SUBCUTANEOUS | Status: DC
Start: 2013-07-30 — End: 2013-08-01
  Administered 2013-07-30 – 2013-07-31 (×2): 7 [IU] via SUBCUTANEOUS
  Filled 2013-07-30: qty 70

## 2013-07-30 MED ORDER — ATORVASTATIN CALCIUM 20 MG PO TABS
10.00 mg | ORAL_TABLET | Freq: Every evening | ORAL | Status: DC
Start: 2013-07-30 — End: 2013-08-02
  Administered 2013-07-31 – 2013-08-01 (×2): 10 mg via ORAL
  Filled 2013-07-30 (×2): qty 1

## 2013-07-30 MED ORDER — DEXTROSE 50 % IV SOLN
25.0000 mL | INTRAVENOUS | Status: DC | PRN
Start: 2013-07-30 — End: 2013-08-02

## 2013-07-30 MED ORDER — GLUCAGON HCL (RDNA) 1 MG IJ SOLR
1.0000 mg | INTRAMUSCULAR | Status: DC | PRN
Start: 2013-07-30 — End: 2013-08-02

## 2013-07-30 MED ORDER — GABAPENTIN 300 MG PO CAPS
300.0000 mg | ORAL_CAPSULE | Freq: Three times a day (TID) | ORAL | Status: DC
Start: 2013-07-30 — End: 2013-08-02
  Administered 2013-07-30 – 2013-08-02 (×8): 300 mg via ORAL
  Filled 2013-07-30 (×8): qty 1

## 2013-07-30 MED ORDER — PANTOPRAZOLE SODIUM 40 MG PO TBEC
40.0000 mg | DELAYED_RELEASE_TABLET | Freq: Every evening | ORAL | Status: DC
Start: 2013-07-30 — End: 2013-08-02
  Administered 2013-07-30 – 2013-08-01 (×3): 40 mg via ORAL
  Filled 2013-07-30 (×3): qty 1

## 2013-07-30 MED ORDER — TRAMADOL HCL 50 MG PO TABS
50.0000 mg | ORAL_TABLET | Freq: Three times a day (TID) | ORAL | Status: DC | PRN
Start: 2013-07-30 — End: 2013-08-02

## 2013-07-30 MED ORDER — WARFARIN SODIUM 5 MG PO TABS
5.00 mg | ORAL_TABLET | Freq: Every day | ORAL | Status: DC
Start: 2013-07-30 — End: 2013-08-02
  Administered 2013-07-30 – 2013-08-01 (×3): 5 mg via ORAL
  Filled 2013-07-30 (×3): qty 1

## 2013-07-30 MED ORDER — NEPHRO-VITE 0.8 MG PO TABS
1.0000 | ORAL_TABLET | Freq: Every day | ORAL | Status: DC
Start: 2013-07-31 — End: 2013-08-02
  Administered 2013-07-31 – 2013-08-01 (×2): 1 via ORAL
  Filled 2013-07-30 (×2): qty 1

## 2013-07-30 MED ORDER — ACETAMINOPHEN 500 MG PO TABS
500.00 mg | ORAL_TABLET | Freq: Four times a day (QID) | ORAL | Status: DC | PRN
Start: 2013-07-30 — End: 2013-08-02
  Administered 2013-07-31: 500 mg via ORAL
  Filled 2013-07-30: qty 1

## 2013-07-30 MED ORDER — PREGABALIN 75 MG PO CAPS
75.00 mg | ORAL_CAPSULE | Freq: Two times a day (BID) | ORAL | Status: DC
Start: 2013-07-30 — End: 2013-08-02
  Administered 2013-07-30 – 2013-08-01 (×5): 75 mg via ORAL
  Filled 2013-07-30 (×5): qty 1

## 2013-07-30 NOTE — Progress Notes (Signed)
Pt arrived from ED. Pt alert and oriented x4.  Tolerating sips of clear liq. Family at bedside. Pt instructed to call RN or tech before getting oob by self. Call bell with in reach. Will continue to check on frequently.

## 2013-07-30 NOTE — ED Notes (Signed)
Pt received boxed lunch

## 2013-07-30 NOTE — ED Provider Notes (Signed)
Physician/Midlevel provider first contact with patient: 07/30/13 [facility] HISTORY AND PHYSICAL EXAM    Date: 07/30/2013  Patient Name: Mary [name]  Attending Physician: Sharren Bridge, MD  Patient DOB:  1945/12/28  MRN:  [id number]  Room:  26/B26    History     Chief Complaint   Patient presents with   . Chest Pain       Historian: Pt  Onset:   Gradual  Severity:  Moderate      The patient Mary [name], is a 67 y.o. female h/o DM, high chol, HTN, CKD on HD, cataracts, GERD, and DVT who presents with chief complaint of increased SOB with onset during dialysis today PTA. Associated with chest pressure. SOB improved with oxygen and chest pressure resolved in ED.     PCP:  Alric Quan, MD      Past Medical History       Past Medical History   Diagnosis Date   . Diabetes mellitus without complication    . Hyperlipidemia    . Hypertensive disorder    . Chronic kidney disease      dialysis t-tr-sat   . Arthritis    . Cataracts, bilateral    . GERD (gastroesophageal reflux disease)    . Acute deep vein thrombosis (DVT) of distal vein of left lower extremity    . Hemodialysis access site with mature fistula          Past Surgical History       Past Surgical History   Procedure Date   . Av fistula placement 2012         Family History    No family history on file.    Social History    History     Social History   . Marital Status: Widowed     Spouse Name: N/A     Number of Children: N/A   . Years of Education: N/A     Social History Main Topics   . Smoking status: Never Smoker    . Smokeless tobacco: None   . Alcohol Use: No   . Drug Use: No   . Sexually Active:      Other Topics Concern   . None     Social History Narrative   . None       Allergies    Allergies   Allergen Reactions   . Penicillins Hives   . Shrimp (Shellfish Allergy) Swelling         Current/Home Medications    Current/Home Medications    ACETAMINOPHEN (TYLENOL) 500 MG TABLET    Take 500 mg by mouth  every 6 (six) hours as needed.      AMLODIPINE (NORVASC) 10 MG TABLET    Take 10 mg by mouth daily.      ASPIRIN 81 MG TABLET    Take 81 mg by mouth daily.      ATORVASTATIN (LIPITOR) 10 MG TABLET    Take 10 mg by mouth daily.      B COMPLEX-VITAMIN C-FOLIC ACID (NEPHRO-VITE) 0.8 MG TABS    Take 0.8 mg by mouth.    BISACODYL (DULCOLAX) 5 MG EC TABLET    Take 5 mg by mouth daily as needed.      CYCLOBENZAPRINE (FLEXERIL) 10 MG TABLET    Take 10 mg by mouth 3 (three) times daily as needed.    ESOMEPRAZOLE ([place])  40 MG CAPSULE    Take 40 mg by mouth every morning before breakfast.      GABAPENTIN (NEURONTIN) 300 MG CAPSULE    Take 300 mg by mouth 3 (three) times daily.    IBUPROFEN (ADVIL,MOTRIN) 600 MG TABLET    Take 600 mg by mouth every 6 (six) hours as needed.    INSULIN ASPART (NOVOLOG) 100 UNIT/ML INJECTION    Inject 1-10 Units into the skin 3 (three) times daily before meals.      INSULIN GLARGINE (LANTUS) 100 UNIT/ML INJECTION    Inject 10-20 Units into the skin nightly.      PREGABALIN (LYRICA) 75 MG CAPSULE    Take 75 mg by mouth 2 (two) times daily.      TRAMADOL (ULTRAM) 50 MG TABLET    Take 1 tablet (50 mg total) by mouth every 6 (six) hours as needed for Pain.    WARFARIN (COUMADIN) 5 MG TABLET    Take 5 mg by mouth daily.       Vital Signs     BP 101/57  Pulse 93  Temp 98.7 F (37.1 C)  Resp 18  Ht 1.6 m  Wt 81.194 kg  BMI 31.72 kg/m2  SpO2 98%  Patient Vitals for the past 24 hrs:   BP Temp Pulse Resp SpO2 Height Weight   07/30/13 1815 101/57 mmHg 98.7 F (37.1 C) 93  18  98 % 1.6 m -   07/30/13 1729 - 98.5 F (36.9 C) - - - - -   07/30/13 1724 124/59 mmHg - 79  20  - - -   07/30/13 1523 98/59 mmHg 99.8 F (37.7 C) 110  18  93 % 1.6 m 81.194 kg       Home Medications     Home medications reviewed by ED MD at 4:10 PM     Previous Medications    ACETAMINOPHEN (TYLENOL) 500 MG TABLET    Take 500 mg by mouth every 6 (six) hours as needed.      AMLODIPINE (NORVASC) 10 MG TABLET    Take 10 mg by  mouth daily.      ASPIRIN 81 MG TABLET    Take 81 mg by mouth daily.      ATORVASTATIN (LIPITOR) 10 MG TABLET    Take 10 mg by mouth daily.      B COMPLEX-VITAMIN C-FOLIC ACID (NEPHRO-VITE) 0.8 MG TABS    Take 0.8 mg by mouth.    BISACODYL (DULCOLAX) 5 MG EC TABLET    Take 5 mg by mouth daily as needed.      CYCLOBENZAPRINE (FLEXERIL) 10 MG TABLET    Take 10 mg by mouth 3 (three) times daily as needed.    ESOMEPRAZOLE ([place]) 40 MG CAPSULE    Take 40 mg by mouth every morning before breakfast.      GABAPENTIN (NEURONTIN) 300 MG CAPSULE    Take 300 mg by mouth 3 (three) times daily.    IBUPROFEN (ADVIL,MOTRIN) 600 MG TABLET    Take 600 mg by mouth every 6 (six) hours as needed.    INSULIN ASPART (NOVOLOG) 100 UNIT/ML INJECTION    Inject 1-10 Units into the skin 3 (three) times daily before meals.      INSULIN GLARGINE (LANTUS) 100 UNIT/ML INJECTION    Inject 10-20 Units into the skin nightly.      PREGABALIN (LYRICA) 75 MG CAPSULE    Take 75 mg by mouth 2 (two) times daily.  TRAMADOL (ULTRAM) 50 MG TABLET    Take 1 tablet (50 mg total) by mouth every 6 (six) hours as needed for Pain.    WARFARIN (COUMADIN) 5 MG TABLET    Take 5 mg by mouth daily.         Review of Systems     Review of Systems   Respiratory: Positive for shortness of breath.    Cardiovascular:        +Chest pressure   All other systems reviewed and are negative.       Physical Exam     CONSTITUTIONAL   Vital signs reviewed, Obese African American female in moderate distress, Mildly dyspneic, Alert and oriented X 3.  HEAD   Atraumatic, Normocephalic  EYES   Eyes are normal to inspection, Pupils equal, round and reactive to light, No discharge from eyes, Extraocular muscles intact, Sclera are normal, Conjunctiva normal.  ENT Mouth normal to Inspection.  NECK   Normal ROM. No jugular venous distention, No meningeal signs  RESPIRATORY Chest is nontender, Breath sounds normal, No respiratory distress, Mildly dyspneic.  CARDIOVASCULAR   Rate is  tachycardic, rhythm is nl, No murmurs.  ABDOMEN   Abdomen is nontender, No masses, No distension, No peritoneal signs.  BACK  There is no CVA Tenderness, Normal inspection.  UPPER EXTREMITY   Inspection normal, No cyanosis., No edema  LOWER EXTREMITY   Inspection normal, No cyanosis, No edema   NEURO   No focal neuro deficits, Speech normal.  SKIN Skin is warm, Skin is dry, Skin is normal color.  LYMPHATIC   No adenopathy in neck.  PSYCHIATRIC Oriented X 3, Normal affect,       ED Medication Orders     ED Medication Orders     None          Orders Placed During this Encounter     Orders Placed This Encounter   Procedures   . Chest AP Portable   . Basic Metabolic Panel   . CBC with differential   . CK with MB if indicated   . Troponin I   . GFR   . Cardiac monitoring   . ED Holding Orders Expire in 8 Hours   . Notify Admitting Attending ( Change in Condition)   . Notify Attending of Patient Arrival to Floor within 8 Hours   . Notify Physician (Vital Signs)   . Notify Physician (Lab Results)   . Vital Signs Q4HR   . ECG 12 Lead   . Saline lock IV   . [name] ED Bed Request       Diagnostic Study Results     Labs     Results     Procedure Component Value Units Date/Time    Troponin I [id number] Collected:07/30/13 1611    Specimen Information:Blood Updated:07/30/13 1636     Troponin I <0.01 ng/mL     Basic Metabolic Panel [id number]  (Abnormal) Collected:07/30/13 1611    Specimen Information:Blood Updated:07/30/13 1635     Glucose 236 (H) mg/dL      BUN 26 (H) mg/dL      Creatinine 4.6 (H) mg/dL      CALCIUM 8.6 mg/dL      Sodium 133 (L)      Potassium 3.9      Chloride 96 (L)      CO2 26      Anion Gap 11.0     CK with MB if indicated [id number] Collected:07/30/13 1611  Specimen Information:Blood Updated:07/30/13 1635     Creatine Kinase (CK) 57 U/L     GFR [id number] Collected:07/30/13 1611     EGFR 11.4 Updated:07/30/13 1635    CBC with differential [id number]  (Abnormal) Collected:07/30/13 1611    Specimen  Information:Blood / Blood Updated:07/30/13 1617     WBC 13.24 (H)      RBC 2.76 (L)      Hgb 8.4 (L) [name]/dL      Hematocrit 24.9 (L) %      MCV 90.2 fL      MCH 30.4 pg      MCHC 33.7 [name]/dL      RDW 14 %      Platelets 304      MPV 10.2 fL      Neutrophils 78 %      Lymphocytes Automated 14 %      Monocytes 7 %      Eosinophils Automated 1 %      Basophils Automated 0 %      Immature Granulocyte 0 %      Nucleated RBC 0      Neutrophils Absolute 10.29 (H)      Abs Lymph Automated 1.90      Abs Mono Automated 0.90      Abs Eos Automated 0.13      Absolute Baso Automated 0.02      Absolute Immature Granulocyte 0.04           Radiologic Studies  Radiology Results (24 Hour)     Procedure Component Value Units Date/Time    Chest AP Portable [id number] Collected:07/30/13 1632    Order Status:Completed  Updated:07/30/13 1637    Narrative:    Clinical history: Chest pain.    Portable AP chest was obtained. Comparison made to prior study dated  07/19/2013.    FINDINGS:    There is stable cardiomegaly. There is interval development of bilateral  pleural effusions with bibasilar airspace opacities. There are low lung  volumes. No pneumothorax is seen.      Impression:        1. New bilateral pleural effusions with bibasilar airspace opacities.  2. Stable cardiomegaly.    Darin Engels, MD   07/30/2013 4:33 PM      .    Clinical Course / MDM       Notes:       Consults:  D/w Dr. Lacy Duverney (Hospitalist) who will admit to tele inpatient.     Data Review     Nursing records reviewed and agree: Yes    Pulse Oximetry Analysis - Normal  Laboratory results reviewed by EDP: Yes  Radiologic study results reviewed by EDP: Yes    I, Anan Dapolito, Royston Bake, MD, personally performed the services documented. Mary [name] is scribing for me on [place],Mary [name]. I reviewed and confirm the accuracy of the information in this medical record.     I, Mary [name], am serving as a scribe to document services personally performed by Sharren Bridge, MD based  on the provider's statements to me.     Rendering Provider: Sharren Bridge, MD        Monitors, EKG     Cardiac Monitor (interpreted by ED physician): Sinus tachycardia, rate at 106 BPM, no ectopy.     EKG (interpreted by ED physician): Sinus tachycardia, rate at 106 BPM, no ectopy, no ischemia.       Clinical Impression & Disposition     Clinical Impression:  1. Chest pain        Disposition  ED Disposition     Admit Bed Type: Telemetry [5]  Bed request comments: tele inpatient  Admitting Physician: Laroy Apple [id number]  Patient Class: Inpatient [101]            Prescriptions  New Prescriptions    No medications on file        Sharren Bridge, MD  07/31/13 2228

## 2013-07-30 NOTE — ED Notes (Signed)
Bed:B26<BR> Expected date:<BR> Expected time:<BR> Means of arrival:<BR> Comments:<BR> Waiting on family to come back. Family had left all belongings in room with pt

## 2013-07-30 NOTE — ED Notes (Signed)
Pt was at dialysis today and became SOB. Pt placed on O2 at center and felt better. Pt reports feeling increased SOB after returning home. Pt reprots "tightness" on right side of chest and pain with deep breathing

## 2013-07-30 NOTE — H&P (Signed)
Patient Type: I     ATTENDING PHYSICIAN: Porfirio Oar, MD     PRIMARY CARE PHYSICIAN:  Emelda Fear, M.D.      CHIEF COMPLAINT:  Shortness of breath.     HISTORY OF PRESENT ILLNESS:  This is a 67 year old African-American lady with history of end-stage renal  disease on hemodialysis through left arm AV fistula since two years ago on  dialysis every Tuesday, Thursday and Saturdays under Dr. Leonette Most.  She  still makes about one cup of urine per day.  She also has history of  diabetes on insulin, hypertension, hyperlipidemia, arthritis,  gastroesophageal reflux disease, and bilateral cataracts.  The patient was  recently admitted to hospital for a left lower quadrant DVT and started on  Coumadin.  Subsequently, on July 29th the patient was admitted because of  chest pain and was evaluated by cardiology, [place] and was  diagnosed with viral pericarditis as her echocardiogram indicated a small  pericardial effusion.  The patient was discharged after two days on  ibuprofen and was supposed to follow up with [facility] for a  repeat echocardiogram which has not been done yet.     With the above history, the patient presented to the emergency department  because of dyspnea.  Apparently, she has been getting gradually more short  of breath in the past few days; the family members noticed that she is more  short of breath in the kitchen last night.  Today, she drove herself to  dialysis unit and on dialysis she was found to be short of breath and was  placed on oxygen.  She also felt some funny feeling and possibly mild  left-sided chest pressure; however, the shortness of breath as well as  chest pain resolved as soon as she was put on oxygen.  She finished her  dialysis for 3-1/2 hours and drove herself back home; however, because of  the shortness of breath decided to come to the emergency department.  She  denies any fevers or chills.  She has a nonproductive mild cough which she  does not  think that is significant, denies any nausea or vomiting.  During  the chest pain episode the patient did not have any diaphoresis; however,  became mildly dizzy.  No radiation of the pain to anywhere else.  She also  denies any significant change in her mild lower extremity edema.  No change  in bowel movements, no abdominal pain.     In the emergency department, the patient's chest x-ray indicated new  bilateral pleural effusions and cardiomegaly.  She was referred for  admission for further management and care.     REVIEW OF SYSTEMS:  Ten-system review of systems is negative except as above.     PAST MEDICAL HISTORY:  As above.     PAST SURGICAL HISTORY:  AV fistula placement left upper extremity in 2012.     FAMILY HISTORY:  Negative for coronary disease.     SOCIAL HISTORY:  She does not smoke, drink, or use any illicit drugs.     ALLERGIES:  SHELLFISH, SHRIMP and PENICILLIN.     MEDICATIONS:  Aspirin 81 mg daily, warfarin 5 mg daily, amlodipine 10 mg daily, Lipitor  10 mg daily, Nexium 40 mg daily, Lyrica 75 mg twice a day, Tylenol as  needed, tramadol 50 mg every 6 hours if needed, Flexeril 10 mg 3 times a  day, Neurontin 300 mg 3 times a day, ibuprofen 600 mg  every 6 hours,  Nephro-Vite once a day, Dulcolax as needed, insulin Lantus 7 units every  night, insulin aspart 3 to 4 units before each meal.     PHYSICAL EXAMINATION:  VITAL SIGNS:  Temperature 98.7, heart rate 93, blood pressure 101/57,  respirations 18, pulse oximetry is 98 to 99% on 2 liters.  GENERAL:  The patient is lying down in bed, is in no distress and responds  to questions appropriately.  HEENT:  Head is atraumatic.  Pupils are equal and reactive to light.   Extraocular movements intact.  Conjunctivae not pale, sclerae nonicteric.   Oropharynx is clear.  Mucous membranes are moist.  NECK:  Supple, difficult to assess jugular vein.  However, there seems to  be some jugular venous distention.  CARDIOVASCULAR:  Regular rate and rhythm.  There  are no murmurs.  No  friction rubs heard.  CHEST:  She has diminished breath sounds up to the mid fields both sides.   No crackles, no wheezes.  ABDOMEN:  Soft, nontender, nondistended, bowel sounds present.  MUSCULOSKELETAL:  She has trace to 1+ bilateral lower extremity edema.  No  cyanosis or clubbing.  Distal pulses are symmetrical.  No joint effusion or  joint tenderness.  SKIN:  Intact, warm, no rash.  NEUROLOGIC:  Alert and oriented x3.  Exam is grossly nonfocal.  She moves  all 4 extremities.     LABORATORY DATA:  WBC 13,000, down from 15,000 from last admission about a week ago,  hemoglobin 8.4 down from 13.1 from last admission, platelets 304, glucose  236, BUN 26, creatinine 4.6, chloride 96, sodium 133, potassium 3.9,  bicarbonate 26, calcium 8.6.  Troponin less than 0.01.  Creatine kinase 57.     IMAGING STUDIES:  Portable chest x-ray shows stable cardiomegaly but interval development of  bilateral pleural effusions with bibasilar airspace opacities.  This is new  since July 29th.  There are low lung volumes.  No pneumothorax is seen.     A 12-lead electrocardiogram indicates sinus tachycardia with rate of 106.   No significant ST-T changes concerning for ongoing ischemia.     ASSESSMENT AND PLAN:  1.  Dyspnea and chest pain.  2.  New bilateral pleural effusions and possibly pericardial effusion.   This is more likely due to new onset heart failure rather than under  dialysis.  3.  End-stage renal disease on hemodialysis Tuesday, Thursday, Saturdays.  4.  Insulin-dependent diabetes.  5.  Recent diagnosis of viral pericarditis.     ASSESSMENT AND PLAN:  This is a 67 year old African-American lady with history of end-stage renal  disease on hemodialysis, diabetes, hypertension, and hyperlipidemia, deep  venous thrombosis on Coumadin and recent diagnosis of viral pericarditis  about one week ago, found to have small pericardial effusion, now  presenting with shortness of breath for a few days and an  episode of chest  pain.     Plan to admit to inpatient status:  1.  Monitor on telemetry, trend troponins to rule out myocardial  infarction, cardiac consultation in the morning.  We will check basic  natriuretic peptide.  The patient will need echocardiogram.  However, this  does not seem to be urgent, as the patient's hemodynamics are stable.  2.  Lasix 80 mg intravenously one dose as the patient is still urinating.   There is no need for hemodialysis tonight.  The patient seems to be  reasonably dialyzed today based on her labs.  Blood pressure is stable;  we  will continue her home medications starting tomorrow.  3.  We will check her PT and INR.  If INR is subtherapeutic, pulmonary  embolism should also be considered; however, this is less likely  considering the clinical presentation.  4.  We will resume the patient's Lantus and put her on sliding scale  insulin and diabetic diet.  5.  The patient is full code.           D:  07/30/2013 21:25 PM by Dr. Trilby Drummer, MD ([id number])  T:  07/30/2013 22:51 PM by       Lorin [id number]) (Doc ID: [id number])

## 2013-07-30 NOTE — Progress Notes (Signed)
Dr Tanya Nones notified patient has arrived to room 437.

## 2013-07-31 LAB — ECG 12-LEAD
Atrial Rate: 106 {beats}/min
P Axis: 49 degrees
P-R Interval: 142 ms
Q-T Interval: 304 ms
QRS Duration: 86 ms
QTC Calculation (Bezet): 403 ms
R Axis: -14 degrees
T Axis: 77 degrees
Ventricular Rate: 106 {beats}/min

## 2013-07-31 LAB — GFR: EGFR: 9.5

## 2013-07-31 LAB — BASIC METABOLIC PANEL
Anion Gap: 15 (ref 5.0–15.0)
BUN: 36 mg/dL — ABNORMAL HIGH (ref 7–19)
CO2: 25 (ref 22–29)
Calcium: 8.4 mg/dL — ABNORMAL LOW (ref 8.5–10.5)
Chloride: 94 — ABNORMAL LOW (ref 98–107)
Creatinine: 5.4 mg/dL — ABNORMAL HIGH (ref 0.6–1.0)
Glucose: 302 mg/dL — ABNORMAL HIGH (ref 70–100)
Potassium: 4.4 (ref 3.5–5.1)
Sodium: 134 — ABNORMAL LOW (ref 136–145)

## 2013-07-31 LAB — CBC
Hematocrit: 24.9 % — ABNORMAL LOW (ref 37.0–47.0)
Hgb: 8.3 g/dL — ABNORMAL LOW (ref 12.0–16.0)
MCH: 30.1 pg (ref 28.0–32.0)
MCHC: 33.3 g/dL (ref 32.0–36.0)
MCV: 90.2 fL (ref 80.0–100.0)
MPV: 11.1 fL (ref 9.4–12.3)
Nucleated RBC: 0 (ref 0–1)
Platelets: 305 (ref 140–400)
RBC: 2.76 — ABNORMAL LOW (ref 4.20–5.40)
RDW: 14 % (ref 12–15)
WBC: 13.5 — ABNORMAL HIGH (ref 3.50–10.80)

## 2013-07-31 LAB — POCT GLUCOSE
Whole Blood Glucose POCT: 198 mg/dL — AB (ref 70–100)
Whole Blood Glucose POCT: 301 mg/dL — AB (ref 70–100)
Whole Blood Glucose POCT: 259 mg/dL — AB (ref 70–100)
Whole Blood Glucose POCT: 368 mg/dL — AB (ref 70–100)

## 2013-07-31 LAB — TROPONIN I: Troponin I: <0.01 ng/mL (ref 0.00–0.09)

## 2013-07-31 LAB — PT/INR
PT INR: 1.3 — ABNORMAL HIGH (ref 0.9–1.1)
PT: 15.6 — ABNORMAL HIGH (ref 12.6–15.0)

## 2013-07-31 MED ORDER — COLCHICINE 0.6 MG PO TABS
0.6000 mg | ORAL_TABLET | Freq: Every day | ORAL | Status: DC
Start: 2013-07-31 — End: 2013-08-02
  Administered 2013-07-31 – 2013-08-01 (×2): 0.6 mg via ORAL
  Filled 2013-07-31 (×2): qty 1

## 2013-07-31 MED ORDER — AMLODIPINE BESYLATE 5 MG PO TABS
5.0000 mg | ORAL_TABLET | Freq: Every day | ORAL | Status: DC
Start: 2013-07-31 — End: 2013-08-02
  Administered 2013-07-31 – 2013-08-01 (×2): 5 mg via ORAL
  Filled 2013-07-31 (×2): qty 1

## 2013-07-31 MED ORDER — COLCHICINE 0.6 MG PO TABS
0.6000 mg | ORAL_TABLET | Freq: Two times a day (BID) | ORAL | Status: DC
Start: 2013-07-31 — End: 2013-07-31

## 2013-07-31 NOTE — Consults (Signed)
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   Subtherapeutic INR    ESRD on HD   Diabetes   HLD   HTN   Anemia   Ruled out for MI   LLE DVT - on coumadin: [place] 05/2013   Echo 07/20/13: NL EF, sm to mod pericardial eff 1 cm, no tamponade   CT chest 07/19/13: No dissection aorta   CXR: New bilateral pleural effusions with bibasilar airspace opacities. Stable cardiomegaly.   EKG: Mild sinus tachy 110 BPM, voltages ok, no electrical alternans      Recommendation:      Reduce amlodipine dose to 5 mg qD given fluid retention and adequately controlled BP's.   Continue statin, Lasix IV.    Hold ASA for now. Doubt active ischemia   Will change motrin to colchicine (NSAID may have exacerbated fluid retention)   Continue warfarin with Goal INR 2.0-3.0   Echo tomorrow to f/u effusion. She also has echo scheduled for 8/15 in our office.  History:   NOELENE RINES is a 67 y.o. female who presents to the hospital on 07/30/2013 with onset of dyspnea and chest pressure. Patient has sx prior to HD yesterday, underwent HD and drove home. However, felt that dyspnea was worse and thus came into ED for further eval. No exertional sx. Pt feels the discomfort in chest somewhat more in the supine position. No N/V/diaphoresis.  Pt ran out of coumadin 2 days ago. INR 1.3 on admit. This AM feels better without CP/dyspnea and is lying flat in bed.     Past Medical History:     Past Medical History   Diagnosis Date   . Diabetes mellitus without complication    . Hyperlipidemia    . Hypertensive disorder    . Chronic kidney disease      dialysis t-tr-sat   . Arthritis    . Cataracts,  bilateral    . GERD (gastroesophageal reflux disease)    . Acute deep vein thrombosis (DVT) of distal vein of left lower extremity    . Hemodialysis access site with mature fistula        Past Surgical History:     Past Surgical History   Procedure Date   . Av fistula placement 2012       Family History:   No family history on file.    Social History:     History     Social History   . Marital Status: Widowed     Spouse Name: N/A     Number of Children: N/A   . Years of Education: N/A     Social History Main Topics   . Smoking status: Never Smoker    . Smokeless tobacco: Not on file   . Alcohol Use: No   . Drug Use: No   . Sexually Active:      Other Topics Concern   . Not on file     Social History Narrative   . No narrative on file       Allergies:     Allergies  Allergen Reactions   . Penicillins Hives   . Shrimp (Shellfish Allergy) Swelling       Medications:     Prescriptions prior to admission   Medication Sig   . cyclobenzaprine (FLEXERIL) 10 MG tablet Take 10 mg by mouth 3 (three) times daily as needed.   . gabapentin (NEURONTIN) 300 MG capsule Take 300 mg by mouth 3 (three) times daily.   Marland Kitchen ibuprofen (ADVIL,MOTRIN) 600 MG tablet Take 600 mg by mouth every 6 (six) hours as needed.   Marland Kitchen acetaminophen (TYLENOL) 500 MG tablet Take 500 mg by mouth every 6 (six) hours as needed.     Marland Kitchen amLODIPine (NORVASC) 10 MG tablet Take 10 mg by mouth daily.     Marland Kitchen aspirin 81 MG tablet Take 81 mg by mouth daily.     Marland Kitchen atorvastatin (LIPITOR) 10 MG tablet Take 10 mg by mouth daily.     Marland Kitchen b complex-vitamin c-folic acid (NEPHRO-VITE) 0.8 MG TABS Take 0.8 mg by mouth.   . bisacodyl (DULCOLAX) 5 MG EC tablet Take 5 mg by mouth daily as needed.     Marland Kitchen esomeprazole (NEXIUM) 40 MG capsule Take 40 mg by mouth every morning before breakfast.     . insulin aspart (NOVOLOG) 100 UNIT/ML injection Inject 1-10 Units into the skin 3 (three) times daily before meals.     . insulin glargine (LANTUS) 100 UNIT/ML injection Inject 10-20 Units into  the skin nightly.     . pregabalin (LYRICA) 75 MG capsule Take 75 mg by mouth 2 (two) times daily.     . traMADOL (ULTRAM) 50 MG tablet Take 1 tablet (50 mg total) by mouth every 6 (six) hours as needed for Pain.   . warfarin (COUMADIN) 5 MG tablet Take 5 mg by mouth daily.        Review of Systems:   Constitional, head, eyes, ears, nose, throat, musculoskeletal, skin, neuro, endo, psych, heme, all negative unless noted in HPI.    Physical Exam:   BP 121/70  Pulse 99  Temp 98 F (36.7 C)  Resp 18  Ht 1.6 m (5\' 3" )  Wt 81.194 kg (179 lb)  BMI 31.72 kg/m2  SpO2 94%      Intake and Output Summary (Last 24 hours) at Date Time  No intake or output data in the 24 hours ending 07/31/13 0846    General Appearance:  Breathing comfortably, no acute distress, obese  Head:  Normocephalic  Eyes:  Extraocular eye movements intact  Neck:  No carotid bruit Cannot assess jugular venous distention due to body habitus, brisk carotid upstroke  Lungs:  Clear to auscultation throughout except decreased at bases, no wheezes, rhonchi or rales, good respiratory effort   Chest Wall:  No tenderness or deformity  Heart:  S1, S2 normal, no S3, no S4, no murmur, PMI not palpable, no rub  Abdomen:  Soft, non-tender, positive bowel sounds  Extremities:  No cyanosis, clubbing. +lipidema  Pulses:  Equal radial pulses, 4/4 symmetric  Neurologic:  Alert and oriented x3, mood and affect normal        Labs Reviewed:       Lab 07/31/13 0657 07/30/13 1611   CK -- 57   TROPI <0.01 <0.01   TROPT -- --   CKMBINDEX -- --     No results found for this basename: DIG in the last 168 hours  No results found for this basename: CHOL:3,TRIG:3,HDL:3,LDL:3 in the last 168 hours  No results found  for this basename: BILITOTAL,BILIDIRECT,PROT,ALB,ALT,AST in the last 168 hours  No results found for this basename: MG in the last 168 hours    Lab 07/31/13 0651   PT 15.6*   INR 1.3*   PTT --       Lab 07/31/13 0657 07/30/13 1611   WBC 13.50* 13.24*   HGB 8.3* 8.4*    HCT 24.9* 24.9*   PLT 305 304       Lab 07/31/13 0657 07/30/13 1611   NA 134* 133*   K 4.4 3.9   CL 94* 96*   CO2 25 26   BUN 36* 26*   CREAT 5.4* 4.6*   EGFR 9.5 11.4   GLU 302* 236*   CA 8.4* 8.6     No results found for this basename: TSH,FREET3,FREET4 in the last 168 hours    No results found for this basename: BNP            Signed by: Timmothy Sours, MD [place]  NP [place] (8am-5pm)  MD [employer] [phone]  (8am-5pm)  After hours, non urgent consult line [phone]  After Hours, urgent consults [phone]

## 2013-07-31 NOTE — Consults (Signed)
[facility]   Consultation    Date Time: 07/31/2013 9:42 AM  Patient Name: Mary, [name]  Medical Record Number: [id number]   Primary Care Physician: @[email or web address] @   Requesting Physician: Laroy Apple, MD    Reason for Consultation:   Kidney failure  Assessment:   CHF  Kidney failure  Plan:   Hemodialysis as scheduled    We will follow this patient closely with you. Thank you for allowing [place] to participate in the care of this patient.    Alric Quan, MD  Office - [phone]      History:   Mary [name] is a 67 y.o. female who presents to the hospital on 07/30/2013 with PMH notable for HTN, DM, MGUS,ESRD on hemodialysis, who was admitted because of SOB. SOB started yesterday during hemodialysis, patient had supplemental oxygen which enabled her to complete hemodialysis. Renal consult requested to assist with management of kidney failure.    Past Medical and Surgical  History:     Past Medical History   Diagnosis Date   . Diabetes mellitus without complication    . Hyperlipidemia    . Hypertensive disorder    . Chronic kidney disease      dialysis t-tr-sat   . Arthritis    . Cataracts, bilateral    . GERD (gastroesophageal reflux disease)    . Acute deep vein thrombosis (DVT) of distal vein of left lower extremity    . Hemodialysis access site with mature fistula        Family History:     Social History:   No tobacco  No EtOH  No IVDA  Married  Allergies:     Allergies   Allergen Reactions   . Penicillins Hives   . Shrimp (Shellfish Allergy) Swelling     Medications:     Current Facility-Administered Medications   Medication Dose Route Frequency   . amLODIPine  5 mg Oral Daily   . atorvastatin  10 mg Oral QHS   . b complex-vitamin c-folic acid  1 tablet Oral Daily   . colchicine  0.6 mg Oral Daily   . [COMPLETED] furosemide  80 mg Intravenous Once   . gabapentin  300 mg Oral Q8H [place]   . insulin glargine  7 Units Subcutaneous QHS   . pantoprazole  40 mg Oral QHS   .  pregabalin  75 mg Oral Q12H SCH   . sodium chloride (PF)  3 mL Intravenous Q8H   . warfarin  5 mg Oral Daily at 1800   . [DISCONTINUED] amLODIPine  10 mg Oral Daily   . [DISCONTINUED] aspirin  81 mg Oral Daily   . [DISCONTINUED] colchicine  0.6 mg Oral BID   . [DISCONTINUED] ibuprofen  600 mg Oral Q6H [place]     Review of Systems:   12 systems were reviewed and were negative except for the following:SOB  Physical Exam:   Temp:  [97.5 F (36.4 C)-99.8 F (37.7 C)] 98 F (36.7 C)  Heart Rate:  [79-110] 99   Resp Rate:  [18-20] 18   BP: (98-124)/(51-70) 121/70 mmHg    Intake and Output Summary (Last 24 hours) at Date Time  No intake or output data in the 24 hours ending 07/31/13 0942    Gen: WN WD NAD  HEENT: NC/AT, moist MM, clear oropharynx  Neck: No JVD  CV: S1 S2 N RRR no M/R/GC  Chest: Good effort, CTAB  Ab: ND NT Soft no HSM +  BS  Skin: Dry  Ext: No C/E  Psych: Appropriate mood and affect    Labs:     Lab 07/31/13 0657 07/30/13 1611   GLU 302* 236*   BUN 36* 26*   CREAT 5.4* 4.6*   CA 8.4* 8.6   NA 134* 133*   K 4.4 3.9   CL 94* 96*   CO2 25 26   ALB -- --   PHOS -- --   MG -- --       Lab 07/31/13 0657 07/30/13 1611   WBC 13.50* 13.24*   RBC 2.76* 2.76*   HGB 8.3* 8.4*   HCT 24.9* 24.9*   MCV 90.2 90.2   MCH 30.1 30.4   MCHC 33.3 33.7   RDW 14 14   MPV 11.1 10.2   PLT 305 304     Radiology:   Echocardiogram Adult Complete W Color Doppler Waveform    07/20/2013   Normal left ventricular systolic function. No significant valvular heart disease. Small pericardial effusion without evidence of cardiac tamponade.    Audria Nine, MD  07/20/2013 4:09 PM     Ct Abdomen Pelvis Wo Iv/ Wo Po Cont    07/20/2013   1. Interval development of a minimal to moderate pericardial effusion and trace pleural effusions since prior exam one day earlier. 2. No detectable acute abnormalities in the abdomen or pelvis. 3. Contrast residue gallbladder, kidneys, and bladder. 4. Numerous small low-attenuation kidney lesions. 5. Remainder as  above. 6. Findings discussed with Dr. Beryle Lathe by Dr. Oren Binet at 3:59 AM.  Sheria Lang, MD  07/20/2013 8:23 AM     Ct Angio Chest    07/19/2013   1. No CT scan evidence for detectable pulmonary embolism. 2. Minimal cardiomegaly. 3. Minimal bibasilar lung atelectasis. 4. Remainder as above.  Sheria Lang, MD  07/19/2013 3:18 PM     Chest Ap Portable    07/30/2013    1. New bilateral pleural effusions with bibasilar airspace opacities. 2. Stable cardiomegaly.  Darin Engels, MD  07/30/2013 4:33 PM     Xr Chest Ap Portable    07/19/2013   Hypoinflation with minimal basilar atelectasis.  Sheria Lang, MD  07/19/2013 2:45 PM   .   EKG:     Prior Records:   I reviewed her old records.

## 2013-07-31 NOTE — Plan of Care (Signed)
Problem: Pain  Goal: Patient's pain/discomfort is manageable  Outcome: Progressing  Patient denies any chest pain or shortness of breath. Pain denies any other pain throughout the shift. Patient resting quietly. Will continue to monitor patient's status.     Problem: Moderate/High Fall Risk Score >/=15  Goal: Patient will remain free of falls  Outcome: Progressing  Patient assisted out of bed to restroom. Falls precautions in place. Bed alarm on. Will continue to monitor patient's safety.

## 2013-07-31 NOTE — Plan of Care (Signed)
Problem: Moderate/High Fall Risk Score >/=15  Goal: Patient will remain free of falls  Outcome: Progressing  Patient alert and oriented.  Remains free from falls/injury.  Bed alarm on.  Call bell within reach.    Problem: Potential for Compromised Hemodynamic Status  Goal: Stable vital signs and fluid balance  Outcome: Progressing  Vital signs stable.  Patient denies and chest pain or shortness of breath this shift.  95% on 2 liters.

## 2013-07-31 NOTE — Progress Notes (Signed)
Date Time: 07/31/2013 10:15 AM  Patient Name: Mary [name],Mary [name]    Subjective:   Denies any active complaints.  States that chest pain has resolved.  No shortness of breath.  No abdominal pain or nausea.    Pt ordering breakfast.     Objective:   VITALS:  Filed Vitals:    07/30/13 1815 07/30/13 2031 07/30/13 2346 07/31/13 0357   BP: 101/57 103/51 117/51 117/68   Pulse: 93 94 96 99   Temp: 98.7 F (37.1 C) 97.5 F (36.4 C) 97.8 F (36.6 C) 97.9 F (36.6 C)   Resp: 18 18 18 18    Height: 1.6 m (5\' 3" )      Weight:       SpO2: 98% 92% 96% 93%     93% on 2LNC    Intake and Output Summary (Last 24 hours) at Date Time  No intake or output data in the 24 hours ending 07/31/13 0744    PHYSICAL EXAM:  GEN APPEARANCE: No acute distress, obese female  HEENT: PERLA; Conjunctiva Clear  NECK: Supple; FROM; no JVD  CVS: RRR, S1, S2; No M/[name]/R  LUNGS: decreased bs bilateral bases R>L; no wheezes or rales  ABD: Soft; NT/ND; Normoactive BS  EXT: trace BLE edema  SKIN: No rash or lesions  NEURO: CN 2-12 intact; No gross focal neurological deficits  PSYCH: Alert and oriented x3. Appropriate affect.     Labs and Diagnostic Data:       Lab 07/31/13 0657 07/30/13 1611   WBC 13.50* 13.24*   HGB 8.3* 8.4*   HCT 24.9* 24.9*   MCV 90.2 90.2   PLT 305 304       Lab 07/31/13 0657 07/30/13 1611   NA 134* 133*   K 4.4 3.9   CL 94* 96*   CO2 25 26   BUN 36* 26*   CREAT 5.4* 4.6*   GLU 302* 236*   CA 8.4* 8.6       Results     Procedure Component Value Units Date/Time    Basic Metabolic Panel [id number] Collected:07/31/13 [id number]    Specimen Information:Blood Updated:07/31/13 [id number]    CBC [id number] Collected:07/31/13 0657    Specimen Information:Blood / Blood Updated:07/31/13 0657    Troponin I [id number] Collected:07/31/13 0657    Specimen Information:Blood Updated:07/31/13 0657    POCT glucose (AC and HS) [id number]  (Abnormal) Collected:07/31/13 0611     POCT Glucose WB 301 (A) mg/dL Updated:07/31/13 0611    POCT glucose (AC and HS) [id number]   (Abnormal) Collected:07/30/13 2110     POCT Glucose WB 198 (A) mg/dL Updated:07/31/13 0611    POCT glucose (AC and HS) [id number]  (Abnormal) Collected:07/30/13 2146     POCT Glucose WB 198 (A) mg/dL Updated:07/30/13 2146    Troponin I [id number] Collected:07/30/13 1611    Specimen Information:Blood Updated:07/30/13 1636     Troponin I <0.01 ng/mL     Basic Metabolic Panel [id number]  (Abnormal) Collected:07/30/13 1611    Specimen Information:Blood Updated:07/30/13 1635     Glucose 236 (H) mg/dL      BUN 26 (H) mg/dL      Creatinine 4.6 (H) mg/dL      CALCIUM 8.6 mg/dL      Sodium 133 (L)      Potassium 3.9      Chloride 96 (L)      CO2 26      Anion Gap 11.0     CK with MB if indicated [id number] Collected:07/30/13 1611  Specimen Information:Blood Updated:07/30/13 1635     Creatine Kinase (CK) 57 U/L     GFR [id number] Collected:07/30/13 1611     EGFR 11.4 Updated:07/30/13 1635    CBC with differential [id number]  (Abnormal) Collected:07/30/13 1611    Specimen Information:Blood / Blood Updated:07/30/13 1617     WBC 13.24 (H)      RBC 2.76 (L)      Hgb 8.4 (L) [name]/dL      Hematocrit 24.9 (L) %      MCV 90.2 fL      MCH 30.4 pg      MCHC 33.7 [name]/dL      RDW 14 %      Platelets 304      MPV 10.2 fL      Neutrophils 78 %      Lymphocytes Automated 14 %      Monocytes 7 %      Eosinophils Automated 1 %      Basophils Automated 0 %      Immature Granulocyte 0 %      Nucleated RBC 0      Neutrophils Absolute 10.29 (H)      Abs Lymph Automated 1.90      Abs Mono Automated 0.90      Abs Eos Automated 0.13      Absolute Baso Automated 0.02      Absolute Immature Granulocyte 0.04         Radiological Studies:   Imaging reviewed.  CXR (8/9): 1. New bilateral pleural effusions with bibasilar airspace opacities. 2. Stable cardiomegaly.    Current Medications:     Current Facility-Administered Medications   Medication Dose Route Frequency   . amLODIPine  5 mg Oral Daily   . atorvastatin  10 mg Oral QHS   . b complex-vitamin  c-folic acid  1 tablet Oral Daily   . colchicine  0.6 mg Oral Daily   . [COMPLETED] furosemide  80 mg Intravenous Once   . gabapentin  300 mg Oral Q8H [place]   . insulin glargine  7 Units Subcutaneous QHS   . pantoprazole  40 mg Oral QHS   . pregabalin  75 mg Oral Q12H [facility]   . sodium chloride (PF)  3 mL Intravenous Q8H   . warfarin  5 mg Oral Daily at 1800   . [DISCONTINUED] amLODIPine  10 mg Oral Daily   . [DISCONTINUED] aspirin  81 mg Oral Daily   . [DISCONTINUED] colchicine  0.6 mg Oral BID   . [DISCONTINUED] ibuprofen  600 mg Oral Q6H [place]     Assessment and Plan:   Mary [name] is a 67 y.o. 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    1. Chest pain, poss 2' to recently diagnosed pericarditis.  Currently resolved.  Appreciate Cardiology asst.  ACS ruled out with 3 sets of neg CE.  CXR with bilateral pleural effusions.   2. Acute diastolic CHF exacerbation.  Appreciate Cardiology asst.  Repeat 2DE ordered.  Nephrology consulted for assistance with diuresis/volume removal with HD.   3. Pericardial effusion.  Repeat 2DE pending to further assess.  BP stable.  No JVD appreciated.   4. Bilateral pleural effusions.  Continue to monitor closely.  Currently maintaining appropriate O2 sats on 2LNC.  Diurese  per Nephrology.   5. Recent viral pericarditis.  Continue colchicine.  If symptoms persist, will d/w Cardiology regarding steroids.   6. ACD.  Continue aranesp.   7. Hyponatremia, hypervolemic.  Na stable.  Continue to monitor closely.    8. ESRD on HD.  Nephrology, Dr. Leonette Most, consulted.   9. HTN.  BP stable.  Continue home meds.   10. IDDM.  Bg levels poorly controlled.  Continue lantus and will adjust dosing prn.  Monitor accuchecks and administer SSI prn.    11. HLD.  Continue statin.    12. History of LLE dvt.  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  INR 1.3.  Pt states that she lost her coumadin prescription 3 days prior to admission.    13. Diabetic neuropathy.  Continue gabapentin and lyrica.   14. GI prophylaxis.  PPI.  15. Code status.  Full code.     DISPOSITION:   Continue to monitor closely on telemetry.     Laroy Apple, MD

## 2013-07-31 NOTE — Discharge Instructions (Signed)
Discharge Instructions for Heart Failure  You have been diagnosed with heart failure. The term "heart failure" sounds scary because it suggests the heart is no longer working. But, it actually means the heart isn't doing its job as well as it should. Heart failure happens when your heart muscle can't keep up with your body's need for blood flow. Symptoms of heart failure can be controlled by changes in your lifestyle and by following your doctor's advice.  Home care  Activity as tolerated as directed by your doctor  Ask your health care provider about an exercise program. You can benefit from simple activities such as walking or gardening. Exercising most days of the week can make you feel better. Don't be discouraged if your progress is slow at first. Rest as needed and stop activity if you develop symptoms such s chest pain, lightheadedness, or significant shortness of breath.  Diet  Follow aheart healthy diet and work hard to remove salt from your diet. Try to limit total salt/sodium intake to 2000 mg a day. Salt causes your body to retain water, which can make it harder for your heart to pump. You can start limiting salt by doing the following:   Limit canned, dried, packaged, and fast foods.   Don't add salt to your food at the table.   Season foods with herbs instead of salt when you cook.  Reduce your fluid intake. Drinking too much fluid can make heart failure worse. It is commonly advised to limit total fluid intake to less than 66 ounces (2 liters) per day.  Limit alcohol. Too much alcohol can be harmful to the heart. Alcohol should be limited to no more than one serving a day for women and two servings a day for men.  Tobacco  If you smoke, you'll need to quit. Smoking increases your chances of having a heart attack, which makes heart failure worse. Quitting smoking is the number one thing you can do to improve your health. Enroll in a stop-smoking program to improve your chances of success. Talk to  your doctor about medications or nicotine replacement therapy to help you quit smoking.  Medication  Take your medications exactly as prescribed. Learn the names and purpose of each of your medications. Keep an accurate medication list and current dosages with you at all times. Don't skip doses. If you miss a dose of your medication, take it as soon as you remember -- unless it's almost time for your next dose. In that case, just wait and take your next dose at the normal time. Don't take a double dose. If you are unsure, call your doctor's office.  Weight monitoring  Weigh yourself every day. A sudden weight gain can indicate your heart failure is worsening. Weight yourself at the same time of day and in the same kind of clothes. Ideally, weight yourself first thing in the morning after you empty your bladder, but before you eat breakfast. Your health care provider will show you how to track your weight. He or she will also discuss with you when you should call if you have a sudden, unexpected increase in your weight.  In general, your health care provider may ask you to report if your weight increases by more than 2 pounds in 1 day or 5 pounds in 1 week, or whatever weight gain you were told by your doctor. This is a sign that you are retaining more fluid than you should be.  Follow-up care  Make a follow  up appointment as directed. Depending on the type and severity of heart failure you have, you may require follow up as early as 7 days from hospital discharge. Keep appointments for checkups and lab tests that are needed to check your medications and condition.  Recognize that your health and even survival depend on your following medical recommendations.  Symptoms  Heart failure can cause a variety of symptoms, including the following:   Shortness of breath   Difficulty breathing at night   Swelling in the legs and feet or in the abdomen   Becoming easily fatigues   Irregular or rapid heartbeat   Weakness  or lightheadedness  It is important to know what to do if symptoms worsen or if you develop signs of worsening heart failure.    When to call your doctor  Call your doctor right away if you have any of the following signs of worsening heart failure:   Sudden weight gain (more than 2 pounds in 1 day or 5pounds in 1 week, or whatever weight gain you were told to report by your doctor)   Trouble breathing not related to being active   New or increased swelling of your legs or ankles   Swelling or pain in your abdomen   Breathing trouble at night (waking up short of breath, needing more pillows to breathe)   Frequent coughing that doesn't go away   Feeling much more tired than usual  When to seek emergency medical attention  Call 911 right away if you develop:   Severe shortness of breath, such that you can't catch your breath even resting   Severe shortness of breath   Severe chest pain that does not resolve with rest or nitroglycerin   Pink, foamy mucus with cough and shortness of breath   A continuous rapid or irregular heartbeat   Passing out or fainting   Stroke symptoms such as sudden numbness or weakness on one side of your face, arm, or leg or sudden confusion, trouble speaking or vision changes      [address], [address] [place] [address]. All rights reserved. This information is not intended as a substitute for professional medical care. Always follow your healthcare professional's instructions.    Heart Failure: Making Changes to Your Diet  You have a condition called heart failure. It is also known as congestive heart failure, or CHF. When you have heart failure, excess fluid is more likely to build up in your body. This makes the heart work harder to pump blood. Fluid buildup causes symptoms such as shortness of breath and edema (swelling). Controlling the amount of salt (sodium) you eat may help stop fluid from building up. Your doctor may also tell you to  reduce the amount of fluid you drink.  Reading Food Labels  Your healthcare provider will tell you how much sodium you can eat each day. Read food labels to keep track. Keep in mind that certain foods are high in salt. These include canned, frozen, and processed foods. Check the amount of sodium in each serving. Watch out for high-sodium ingredients. These include MSG (monosodium glutamate), baking soda, and sodium phosphate.  Eating Less Salt  Give yourself time to get used to eating less salt. It may take a little while. Here are some tips to help:   Take the saltshaker off the table. Replace it with salt-free herb mixes and spices.   Eat fresh or plain frozen vegetables. These have much less  salt than canned vegetables.   Choose low-sodium snacks like sodium-free pretzels, crackers, or air-popped popcorn.   Don't add salt to your food when you're cooking. Instead, season your foods with pepper, lemon, garlic, or onion.   When you eat out, ask that your food be cooked without added salt.  If You're Told to Limit Fluid  You may need to limit fluid intake to help prevent edema. This includes anything that is liquid at room temperature, such as ice cream and soup. If your doctor tells you to limit fluid, try these tips:   Measure drinks in a measuring cup before you drink them. This will help you meet daily goals.   Chill drinks to make them more refreshing.   Suck on frozen lemon wedges to quench thirst.   Only drink when you're thirsty.   Chew sugarless gum or suck on hard candy to keep your mouth moist.  When to Call Your Doctor  Call your doctor right away if you have any symptoms of worsening heart failure. These can include:   Sudden weight gain   Increased swelling of your legs or ankles   Trouble breathing when you're resting or at night   [address], [place] [address]. All rights reserved. This information is not intended as a substitute for  professional medical care. Always follow your healthcare professional's instructions.

## 2013-08-01 ENCOUNTER — Inpatient Hospital Stay: Payer: Medicare Other

## 2013-08-01 LAB — BASIC METABOLIC PANEL
Anion Gap: 17 — ABNORMAL HIGH (ref 5.0–15.0)
BUN: 52 mg/dL — ABNORMAL HIGH (ref 7–19)
CO2: 25 (ref 22–29)
Calcium: 8.8 mg/dL (ref 8.5–10.5)
Chloride: 95 — ABNORMAL LOW (ref 98–107)
Creatinine: 6.7 mg/dL — ABNORMAL HIGH (ref 0.6–1.0)
Glucose: 194 mg/dL — ABNORMAL HIGH (ref 70–100)
Potassium: 4.8 (ref 3.5–5.1)
Sodium: 137 (ref 136–145)

## 2013-08-01 LAB — PT/INR
PT INR: 1.3 — ABNORMAL HIGH (ref 0.9–1.1)
PT: 15.8 — ABNORMAL HIGH (ref 12.6–15.0)

## 2013-08-01 LAB — GFR: EGFR: 7.4

## 2013-08-01 LAB — CBC
Hematocrit: 27.7 % — ABNORMAL LOW (ref 37.0–47.0)
Hgb: 9.2 g/dL — ABNORMAL LOW (ref 12.0–16.0)
MCH: 29.8 pg (ref 28.0–32.0)
MCHC: 33.2 g/dL (ref 32.0–36.0)
MCV: 89.6 fL (ref 80.0–100.0)
MPV: 11.1 fL (ref 9.4–12.3)
Nucleated RBC: 0 (ref 0–1)
Platelets: 348 (ref 140–400)
RBC: 3.09 — ABNORMAL LOW (ref 4.20–5.40)
RDW: 14 % (ref 12–15)
WBC: 13.6 — ABNORMAL HIGH (ref 3.50–10.80)

## 2013-08-01 LAB — POCT GLUCOSE
Whole Blood Glucose POCT: 187 mg/dL — AB (ref 70–100)
Whole Blood Glucose POCT: 298 mg/dL — AB (ref 70–100)
Whole Blood Glucose POCT: 340 mg/dL — AB (ref 70–100)
Whole Blood Glucose POCT: 341 mg/dL — AB (ref 70–100)

## 2013-08-01 MED ORDER — INSULIN GLARGINE 100 UNIT/ML SC SOLN
12.0000 [IU] | Freq: Every evening | SUBCUTANEOUS | Status: DC
Start: 2013-08-01 — End: 2013-08-02
  Administered 2013-08-01: 12 [IU] via SUBCUTANEOUS

## 2013-08-01 NOTE — Progress Notes (Addendum)
[facility]  PROGRESS NOTE  [name]     Date Time: 08/01/2013 9:32 AM  Patient Name: Mary [name], Mary [name]  Medical Record #:  [id number]  Account#:  [id number]  Admission Date:  07/30/2013       Patient Active Problem List   Diagnosis   . Cellulitis of left leg   . ESRD (end stage renal disease)   . DM (diabetes mellitus)   . Hypertension   . Cellulitis   . Chest pain at rest   . Viral pericarditis   . Dyspnea   . Chest pain   . Pleural effusion       Subjective:   Denies chest pain, SOB or palpitations.    Assessment:   Dyspnea and chest pain, likely acute on chronic diastolic CHF in setting of ESRD and pericardial effusion   Presumed viral pericarditis.   Subtherapeutic INR   ESRD on HD   Diabetes   HLD   HTN   Anemia   Ruled out for MI   LLE DVT - on coumadin: [place] 05/2013   Echo 07/20/13: NL EF, small to mod pericardial eff 1 cm, no tamponade   CT chest 07/19/13: No dissection aorta   CXR: New bilateral pleural effusions with bibasilar airspace opacities. Stable cardiomegaly.       Recommendation:   1. Echo today to reassess pericardial effusion.  2. Continue colchicine for presumed viral pericarditis.  3. Continue Coumadin with target INR of 2.0-3.0.  4. Monitor HCT, currently 27.7 in the setting of anticoagulation.  5. Continue hemodialysis.    Signed by: Oris Drone, NP    Patient seen and examined.  Please see full NP progress note for details.  Cardiac : Regular rhythm.  Agree with plan as outlined.    Jovita Kussmaul MD    Medications:      Scheduled Meds:         amLODIPine 5 mg Oral Daily   atorvastatin 10 mg Oral QHS   b complex-vitamin c-folic acid 1 tablet Oral Daily   colchicine 0.6 mg Oral Daily   gabapentin 300 mg Oral Q8H [place]   insulin glargine 7 Units Subcutaneous QHS   pantoprazole 40 mg Oral QHS   pregabalin 75 mg Oral Q12H [place]   sodium chloride (PF) 3 mL Intravenous Q8H   warfarin 5 mg Oral Daily at 1800       Continuous Infusions:        Physical Exam:     Filed Vitals:    08/01/13 0804   BP:  123/61   Pulse: 94   Temp: 97.2 F (36.2 C)   Resp: 16   SpO2: 96%     Telemetry : sinus rhythm no ectopy..     Intake and Output Summary (Last 24 hours) at Date Time    Intake/Output Summary (Last 24 hours) at 08/01/13 0932  Last data filed at 08/01/13 0617   Gross per 24 hour   Intake    240 ml   Output      0 ml   Net    240 ml       General Appearance:  Breathing comfortable, no acute distress  Head:  normocephalic  Eyes:  EOM's intact  Neck:  No carotid bruit or jugular venous distension, brisk carotid upstroke  Lungs:  Clear to auscultation throughout, no wheezes, rhonchi or rales, good respiratory effort   Chest Wall:  No tenderness or deformity  Heart:  S1, S2 normal, no  S3, no S4,   Abdomen:  Soft, non-tender, positive bowel sounds, obese abdomen  Extremities: trace pretibial and ankle edema.  Pulses:  Equal radial pulses, 4/4 symmetric  Neurologic:  Alert and oriented x3, mood and affect normal    Labs:      Lab 07/31/13 0657 07/30/13 1611   CK -- 57   TROPI <0.01 <0.01   TROPT -- --   CKMBINDEX -- --         Lab 07/31/13 0651   PT 15.6*   INR 1.3*   PTT --       Lab 08/01/13 0617 07/31/13 0657 07/30/13 1611   WBC 13.60* 13.50* 13.24*   HGB 9.2* 8.3* 8.4*   HCT 27.7* 24.9* 24.9*   PLT 348 305 304       Lab 08/01/13 0617 07/31/13 0657 07/30/13 1611   NA 137 134* 133*   K 4.8 4.4 3.9   CL 95* 94* 96*   CO2 25 25 26    BUN 52* 36* 26*   CREAT 6.7* 5.4* 4.6*   EGFR 7.4 9.5 11.4   GLU 194* 302* 236*   CA 8.8 8.4* 8.6          Rads:   Radiological Procedure reviewed.                     Signed by: Oris Drone, NP        [place]  NP [place] (8am-5pm)  MD [facility] [phone] (8am-5pm)  After hours, non urgent consult line [phone]  After Hours, urgent consults [phone]

## 2013-08-01 NOTE — Progress Notes (Addendum)
Date Time: 08/01/2013 10:10 AM  Patient Name: Mary [name],Mary [name]    Subjective:   Denies any active complaints.  States that chest pain has resolved.  No shortness of breath this AM.    No abdominal pain or nausea.      Objective:   VITALS:  Filed Vitals:    07/31/13 1941 07/31/13 2316 08/01/13 0411 08/01/13 0617   BP: 124/60 107/60 137/62    Pulse: 97 100 94    Temp: 97.7 F (36.5 C) 98.4 F (36.9 C) 98.1 F (36.7 C)    TempSrc: Oral      Resp: 18 18 20     Height:       Weight:    87.8 kg (193 lb 9 oz)   SpO2: 93% 96% 94%      96% on 2LNC    Intake and Output Summary (Last 24 hours) at Date Time    Intake/Output Summary (Last 24 hours) at 08/01/13 0750  Last data filed at 08/01/13 0617   Gross per 24 hour   Intake    240 ml   Output      0 ml   Net    240 ml     PHYSICAL EXAM:  GEN APPEARANCE: No acute distress, obese female sitting in chair  HEENT: PERLA; Conjunctiva Clear  NECK: Supple; FROM; no JVD  CVS: RRR, S1, S2; No M/[name]/R  LUNGS: decreased bs bilateral bases R>L; no wheezes or rales  ABD: Soft; NT/ND; Normoactive BS  EXT: trace BLE edema  SKIN: No rash or lesions  NEURO: CN 2-12 intact; No gross focal neurological deficits  PSYCH: Alert and oriented x3. Appropriate affect.     Labs and Diagnostic Data:       Lab 08/01/13 0617 07/31/13 0657 07/30/13 1611   WBC 13.60* 13.50* 13.24*   HGB 9.2* 8.3* 8.4*   HCT 27.7* 24.9* 24.9*   MCV 89.6 90.2 90.2   PLT 348 305 304       Lab 08/01/13 0617 07/31/13 0657 07/30/13 1611   NA 137 134* 133*   K 4.8 4.4 3.9   CL 95* 94* 96*   CO2 25 25 26    BUN 52* 36* 26*   CREAT 6.7* 5.4* 4.6*   GLU 194* 302* 236*   CA 8.8 8.4* 8.6       Results     Procedure Component Value Units Date/Time    Basic Metabolic Panel [id number]  (Abnormal) Collected:08/01/13 0617    Specimen Information:Blood Updated:08/01/13 0724     Glucose 194 (H) mg/dL      BUN 52 (H) mg/dL      Creatinine 6.7 (H) mg/dL      CALCIUM 8.8 mg/dL      Sodium 137      Potassium 4.8      Chloride 95 (L)      CO2 25       Anion Gap 17.0 (H)     GFR [id number] Collected:08/01/13 0617     EGFR 7.4 Updated:08/01/13 0724    CBC [id number]  (Abnormal) Collected:08/01/13 0617    Specimen Information:Blood / Blood Updated:08/01/13 0713     WBC 13.60 (H)      RBC 3.09 (L)      Hgb 9.2 (L) [name]/dL      Hematocrit 27.7 (L) %      MCV 89.6 fL      MCH 29.8 pg      MCHC 33.2 [name]/dL  RDW 14 %      Platelets 348      MPV 11.1 fL      Nucleated RBC 0     POCT glucose (AC and HS) [id number]  (Abnormal) Collected:08/01/13 0617     POCT Glucose WB 187 (A) mg/dL Updated:08/01/13 0618    POCT glucose (AC and HS) [id number]  (Abnormal) Collected:07/31/13 2125     POCT Glucose WB 368 (A) mg/dL Updated:07/31/13 2125    POCT glucose (AC and HS) [id number]  (Abnormal) Collected:07/31/13 1221     POCT Glucose WB 259 (A) mg/dL Updated:07/31/13 0000000    Basic Metabolic Panel [id number]  (Abnormal) Collected:07/31/13 0657    Specimen Information:Blood Updated:07/31/13 0830     Glucose 302 (H) mg/dL      BUN 36 (H) mg/dL      Creatinine 5.4 (H) mg/dL      CALCIUM 8.4 (L) mg/dL      Sodium 134 (L)      Potassium 4.4      Chloride 94 (L)      CO2 25      Anion Gap 15.0     GFR [[id number]] Collected:07/31/13 0657     EGFR 9.5 Updated:07/31/13 0830    Troponin I [id number] Collected:07/31/13 0657    Specimen Information:Blood Updated:07/31/13 0829     Troponin I <0.01 ng/mL     Prothrombin time/INR [id number]  (Abnormal) Collected:07/31/13 0651    Specimen Information:Blood Updated:07/31/13 0825     PT 15.6 (H)      PT INR 1.3 (H)      PT Anticoag. Given Within 48 hrs. warfarin ([place]     CBC [id number]  (Abnormal) Collected:07/31/13 0657    Specimen Information:Blood / Blood Updated:07/31/13 0809     WBC 13.50 (H)      RBC 2.76 (L)      Hgb 8.3 (L) [name]/dL      Hematocrit 24.9 (L) %      MCV 90.2 fL      MCH 30.1 pg      MCHC 33.3 [name]/dL      RDW 14 %      Platelets 305      MPV 11.1 fL      Nucleated RBC 0         Radiological Studies:   Imaging reviewed.  CXR  (8/9): 1. New bilateral pleural effusions with bibasilar airspace opacities. 2. Stable cardiomegaly.    Current Medications:     Current Facility-Administered Medications   Medication Dose Route Frequency   . amLODIPine  5 mg Oral Daily   . atorvastatin  10 mg Oral QHS   . b complex-vitamin c-folic acid  1 tablet Oral Daily   . colchicine  0.6 mg Oral Daily   . gabapentin  300 mg Oral Q8H [place]   . insulin glargine  7 Units Subcutaneous QHS   . pantoprazole  40 mg Oral QHS   . pregabalin  75 mg Oral Q12H SCH   . sodium chloride (PF)  3 mL Intravenous Q8H   . warfarin  5 mg Oral Daily at 1800   . [DISCONTINUED] amLODIPine  10 mg Oral Daily   . [DISCONTINUED] aspirin  81 mg Oral Daily   . [DISCONTINUED] colchicine  0.6 mg Oral BID   . [DISCONTINUED] ibuprofen  600 mg Oral Q6H [place]     Assessment and Plan:   Mary [name] is a 67 y.o. female with PMH significant for recent hospitalization 2' to  viral pericarditis with moderate pericardial effusion, previously diagnosed LLE dvt on AC, ESRD on HD, HTN, HLD and IDDM was admitted on 8/9 2' to recurrent chest pain with associated shortness of breath.    1. Chest pain, poss 2' to recently diagnosed pericarditis.  Currently resolved.  Appreciate Cardiology asst.  ACS ruled out with 3 sets of neg CE.  CXR with bilateral pleural effusions.   2. Acute diastolic CHF exacerbation.  Appreciate Cardiology asst.  Repeat 2DE ordered.  Nephrology consulted for assistance with diuresis/volume removal with HD.   3. Pericardial effusion.  Repeat 2DE pending to further assess.  BP stable.  No JVD appreciated.   4. Bilateral pleural effusions.  Continue to monitor closely.  Currently maintaining appropriate O2 sats on 2LNC.  Diurese per Nephrology.   5. Recent viral pericarditis.  Continue colchicine.   6. ACD.  Continue aranesp.   7. ESRD on HD.  Nephrology, Dr. Leonette Most, following.  8. HTN.  BP stable.  Continue home meds.   9. IDDM.  Bg levels poorly controlled.  Increased lantus dosing  today.  Continue to monitor and will adjust dosing prn.  Monitor accuchecks and administer SSI prn.    10. HLD.  Continue statin.   11. History of LLE dvt.  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 Pt states that she lost her coumadin prescription 3 days prior to admission.  Repeat INR pending.    12. Diabetic neuropathy.  Continue gabapentin and lyrica.   13. GI prophylaxis.  PPI.  14. Code status.  Full code.     DISPOSITION:   Continue to monitor closely on telemetry.     Laroy Apple, MD    ADDENDUM:  2DE (8/11): 1. Atrial septal aneurysm is present. 2. There is a small to moderate pericardial effusion noted both anteriorly and posteriorly. this is only slightly increased from the previous echocardiogram dated 07/20/2013. Variation of tricuspid flow velocity is noted. There is no diastolic collapse of the right ventricle noted. There is organized material note noted in the apex of the right  ventricle not previously reported. Small pleural effusion is present. Clinical correlation is required considering these  echocardiographic changes. 3. There are no other significant changes noted.    2DE report discussed with Dr. Werner Lean who recommends follow-up imaging regarding organized material noted in apex of right ventricle and confirms no evidence of tamponade.  Case discussed with Dr. Truddie Coco who also will review 2DE.  Repeat 2DE already scheduled on 8/15.

## 2013-08-01 NOTE — Consults (Signed)
Consult received to evaluate skin integrity on left buttock on this 67 year old female admitted with dyspnea.  PMH includes DM, HLD, HTN, CKD on HD, GERD, cataracts, DVT, arthritis..    Assessment:  Patient sitting in recline chair. Able to stand for assessment of buttocks. To the left of the gluteal cleft is a small <0.5 cm round firm node with yellow surface. Appears to be a hardened pustule of some sort. No induration, fluctuance or erythema noted.  Patient states she has had for some time.  Patient states it is not painful.    Recommendations:  Encouraged patient to bring this to the attention of her PCP after discharge. Attending MD notified.. No topical treatment or dressing necessary at this point in time.

## 2013-08-01 NOTE — Plan of Care (Signed)
Problem: Pain  Goal: Patient's pain/discomfort is manageable  Outcome: Progressing  PT denies any pain nor sob at this time.  VS were stable. Will continue to monitor.    Problem: Moderate/High Fall Risk Score >/=15  Goal: Patient will remain free of falls  Outcome: Progressing  Pt would call if needed to get oob as instructed.  Denies any pain nor sob at this time.  Bed alarms on.  Call bell within reach.

## 2013-08-01 NOTE — Progress Notes (Addendum)
Mary [name] is a 67 y.o. female patient with PMH notable for HTN, DM, MGUS, ESRD on hemodialysis, admitted because of  chest pain, SOB.      Principal Problem:   *Dyspnea  Active Problems:   Chest pain   Pleural effusion    Past Medical History   Diagnosis Date   . Diabetes mellitus without complication    . Hyperlipidemia    . Hypertensive disorder    . Chronic kidney disease      dialysis t-tr-sat   . Arthritis    . Cataracts, bilateral    . GERD (gastroesophageal reflux disease)    . Acute deep vein thrombosis (DVT) of distal vein of left lower extremity    . Hemodialysis access site with mature fistula      Current Facility-Administered Medications   Medication Dose Route Frequency Provider Last Rate Last Dose   . acetaminophen (TYLENOL) tablet 500 mg  500 mg Oral Q6H PRN Trilby Drummer, MD   500 mg at 07/31/13 1715   . amLODIPine (NORVASC) tablet 5 mg  5 mg Oral Daily Timmothy Sours, MD   5 mg at 08/01/13 1000   . atorvastatin (LIPITOR) tablet 10 mg  10 mg Oral QHS Trilby Drummer, MD   10 mg at 07/31/13 2146   . b complex-vitamin c-folic acid (NEPHRO-VITE) tablet 1 tablet  1 tablet Oral Daily Rashidi, Ray Church, MD   1 tablet at 08/01/13 1000   . bisacodyl (DULCOLAX) EC tablet 5 mg  5 mg Oral QD PRN Trilby Drummer, MD   5 mg at 08/01/13 1000   . colchicine tablet 0.6 mg  0.6 mg Oral Daily Timmothy Sours, MD   0.6 mg at 08/01/13 1000   . cyclobenzaprine (FLEXERIL) tablet 10 mg  10 mg Oral TID PRN Rashidi, Ray Church, MD       . dextrose (GLUCOSE) 40 % oral gel 15 g  15 g Oral PRN Rashidi, Ray Church, MD       . dextrose 50 % bolus 25 mL  25 mL Intravenous PRN Rashidi, Ray Church, MD       . gabapentin (NEURONTIN) capsule 300 mg  300 mg Oral Q8H SCH Rashidi, Ray Church, MD   300 mg at 08/01/13 1305   . glucagon (rDNA) (GLUCAGEN) injection 1 mg  1 mg Intramuscular PRN Rashidi, Ray Church, MD       . insulin aspart (NovoLOG) injection 1-5 Units  1-5 Units Subcutaneous TID AC PRN Trilby Drummer, MD   4 Units at 08/01/13 1837   . insulin glargine (LANTUS) injection 12 Units  12 Units Subcutaneous QHS Laroy Apple, MD       . pantoprazole (PROTONIX) EC tablet 40 mg  40 mg Oral QHS Trilby Drummer, MD   40 mg at 07/31/13 2146   . pregabalin (LYRICA) capsule 75 mg  75 mg Oral Q12H [place] Rashidi, Ray Church, MD   75 mg at 08/01/13 1000   . sodium chloride (PF) 0.9 % flush 3 mL  3 mL Intravenous Q8H Rashidi, Ray Church, MD       . traMADol (ULTRAM) tablet 50 mg  50 mg Oral Q8H PRN Rashidi, Ray Church, MD       . warfarin (COUMADIN) tablet 5 mg  5 mg Oral Daily at 1800 Rashidi, Ray Church, MD   5 mg at 08/01/13 1742   . [DISCONTINUED] insulin glargine (LANTUS) injection 7 Units  7 Units Subcutaneous  QHS Trilby Drummer, MD   7 Units at 07/31/13 2147     Allergies   Allergen Reactions   . Penicillins Hives   . Shrimp (Shellfish Allergy) Swelling     Blood pressure 109/52, pulse 100, temperature 97.2 F (36.2 C), temperature source Oral, resp. rate 18, height 1.6 m (5\' 3" ), weight 87.8 kg (193 lb 9 oz), SpO2 98.00%.    Subjective:  Symptoms:  She reports shortness of breath, chest pain and weakness.    Diet:  Adequate intake.    Activity level: Impaired due to pain.      Objective:  General Appearance:  In no acute distress.    Vital signs: (most recent): Blood pressure 109/52, pulse 100, temperature 97.2 F (36.2 C), temperature source Oral, resp. rate 18, height 1.6 m (5\' 3" ), weight 87.8 kg (193 lb 9 oz), SpO2 98.00%.    Output: No urine output and producing stool.    HEENT: Normal HEENT exam.    Lungs:  There are decreased breath sounds.    Heart: Tachycardia.    Extremities: Normal range of motion.    Neurological: Patient is alert.    Skin:  Warm.    Abdomen: Abdomen is soft.  There is no abdominal tenderness.       Results     Procedure Component Value Units Date/Time    POCT glucose (AC and HS) [id number]  (Abnormal) Collected:08/01/13 1600     POCT Glucose WB 340 (A) mg/dL Updated:08/01/13  1759    Prothrombin time/INR [id number]  (Abnormal) Collected:08/01/13 1708    Specimen Information:Blood Updated:08/01/13 1733     PT 15.8 (H)      PT INR 1.3 (H)      PT Anticoag. Given Within 48 hrs. warfarin ([place]     POCT glucose (AC and HS) [id number]  (Abnormal) Collected:08/01/13 1155     POCT Glucose WB 298 (A) mg/dL Updated:08/01/13 [id number]    Basic Metabolic Panel [id number]  (Abnormal) Collected:08/01/13 0617    Specimen Information:Blood Updated:08/01/13 0724     Glucose 194 (H) mg/dL      BUN 52 (H) mg/dL      Creatinine 6.7 (H) mg/dL      CALCIUM 8.8 mg/dL      Sodium 137      Potassium 4.8      Chloride 95 (L)      CO2 25      Anion Gap 17.0 (H)     GFR [id number] Collected:08/01/13 0617     EGFR 7.4 Updated:08/01/13 0724    CBC [id number]  (Abnormal) Collected:08/01/13 0617    Specimen Information:Blood / Blood Updated:08/01/13 0713     WBC 13.60 (H)      RBC 3.09 (L)      Hgb 9.2 (L) g/dL      Hematocrit 27.7 (L) %      MCV 89.6 fL      MCH 29.8 pg      MCHC 33.2 g/dL      RDW 14 %      Platelets 348      MPV 11.1 fL      Nucleated RBC 0     POCT glucose (AC and HS) [id number]  (Abnormal) Collected:08/01/13 0617     POCT Glucose WB 187 (A) mg/dL Updated:08/01/13 0618    POCT glucose (AC and HS) [id number]  (Abnormal) Collected:07/31/13 2125     POCT Glucose WB 368 (A) mg/dL Updated:07/31/13 2125      Echocardiogram Adult  Complete W Clr/ Dopp Waveform    08/01/2013  impression: 1. Atrial septal aneurysm is present. 2. There is a small to moderate pericardial effusion noted both anteriorly and posteriorly.  this is only slightly increased from the previous echocardiogram dated 07/20/2013. Variation of tricuspid flow velocity is noted. There is no diastolic collapse of the right ventricle noted. There is organized material note noted in the apex of the right ventricle not previously reported. Small small pleural effusion is present. Clinical correlation is required considering these  echocardiographic changes. 3. There are no other significant echogram echocardiographic changes noted.  Lennon Alstrom, MD  08/01/2013 2:03 PM     Echocardiogram Adult Complete W Color Doppler Waveform    07/20/2013   Normal left ventricular systolic function. No significant valvular heart disease. Small pericardial effusion without evidence of cardiac tamponade.    Audria Nine, MD  07/20/2013 4:09 PM     Ct Abdomen Pelvis Wo Iv/ Wo Po Cont    07/20/2013   1. Interval development of a minimal to moderate pericardial effusion and trace pleural effusions since prior exam one day earlier. 2. No detectable acute abnormalities in the abdomen or pelvis. 3. Contrast residue gallbladder, kidneys, and bladder. 4. Numerous small low-attenuation kidney lesions. 5. Remainder as above. 6. Findings discussed with Dr. Beryle Lathe by Dr. Oren Binet at 3:59 AM.  Sheria Lang, MD  07/20/2013 8:23 AM     Ct Angio Chest    07/19/2013   1. No CT scan evidence for detectable pulmonary embolism. 2. Minimal cardiomegaly. 3. Minimal bibasilar lung atelectasis. 4. Remainder as above.  Sheria Lang, MD  07/19/2013 3:18 PM     Chest Ap Portable    07/30/2013    1. New bilateral pleural effusions with bibasilar airspace opacities. 2. Stable cardiomegaly.  Darin Engels, MD  07/30/2013 4:33 PM     Xr Chest Ap Portable    07/19/2013   Hypoinflation with minimal basilar atelectasis.  Sheria Lang, MD  07/19/2013 2:45 PM     Assessment:  (1.chest pain  2.pericarditis  3.ESRD  4.HTN  5.DM).       Plan:   (1.Hemodialysis as scheduled  2.monitor serum electrolytes).       Alric Quan  08/01/2013

## 2013-08-02 LAB — CBC
Hematocrit: 24.4 % — ABNORMAL LOW (ref 37.0–47.0)
Hgb: 8.3 g/dL — ABNORMAL LOW (ref 12.0–16.0)
MCH: 29.7 pg (ref 28.0–32.0)
MCHC: 34 g/dL (ref 32.0–36.0)
MCV: 87.5 fL (ref 80.0–100.0)
MPV: 10.7 fL (ref 9.4–12.3)
Nucleated RBC: 0 (ref 0–1)
Platelets: 325 (ref 140–400)
RBC: 2.79 — ABNORMAL LOW (ref 4.20–5.40)
RDW: 14 % (ref 12–15)
WBC: 12.89 — ABNORMAL HIGH (ref 3.50–10.80)

## 2013-08-02 LAB — PT/INR
PT INR: 1.5 — ABNORMAL HIGH (ref 0.9–1.1)
PT: 17.8 — ABNORMAL HIGH (ref 12.6–15.0)

## 2013-08-02 LAB — GFR: EGFR: 6.8

## 2013-08-02 LAB — BASIC METABOLIC PANEL
Anion Gap: 13 (ref 5.0–15.0)
BUN: 65 mg/dL — ABNORMAL HIGH (ref 7–19)
CO2: 25 (ref 22–29)
Calcium: 8.8 mg/dL (ref 8.5–10.5)
Chloride: 97 — ABNORMAL LOW (ref 98–107)
Creatinine: 7.2 mg/dL — ABNORMAL HIGH (ref 0.6–1.0)
Glucose: 156 mg/dL — ABNORMAL HIGH (ref 70–100)
Potassium: 4.9 (ref 3.5–5.1)
Sodium: 135 — ABNORMAL LOW (ref 136–145)

## 2013-08-02 LAB — POCT GLUCOSE
Whole Blood Glucose POCT: 167 mg/dL — AB (ref 70–100)
Whole Blood Glucose POCT: 288 mg/dL — AB (ref 70–100)

## 2013-08-02 MED ORDER — SODIUM CHLORIDE 0.9 % IV BOLUS
250.0000 mL | INTRAVENOUS | Status: AC | PRN
Start: 2013-08-02 — End: 2013-08-02

## 2013-08-02 MED ORDER — AMLODIPINE BESYLATE 5 MG PO TABS
5.0000 mg | ORAL_TABLET | Freq: Every day | ORAL | Status: DC
Start: 2013-08-02 — End: 2013-08-19

## 2013-08-02 MED ORDER — COLCHICINE 0.6 MG PO TABS
0.6000 mg | ORAL_TABLET | Freq: Every day | ORAL | Status: DC
Start: 2013-08-02 — End: 2019-06-30

## 2013-08-02 MED ORDER — SODIUM CHLORIDE 0.9 % IV BOLUS
100.0000 mL | INTRAVENOUS | Status: AC | PRN
Start: 2013-08-02 — End: 2013-08-02

## 2013-08-02 MED ORDER — LIDOCAINE-TRANSPARENT DRESSING 4 % EX KIT
PACK | CUTANEOUS | Status: DC | PRN
Start: 2013-08-02 — End: 2013-08-02
  Administered 2013-08-02: 1 via TOPICAL
  Filled 2013-08-02: qty 1

## 2013-08-02 MED ORDER — WARFARIN SODIUM 5 MG PO TABS
5.0000 mg | ORAL_TABLET | Freq: Every day | ORAL | Status: DC
Start: 2013-08-02 — End: 2013-08-19

## 2013-08-02 MED ORDER — HEPARIN SODIUM (PORCINE) 1000 UNIT/ML IJ SOLN
1000.0000 [IU] | INTRAMUSCULAR | Status: AC | PRN
Start: 2013-08-02 — End: 2013-08-02

## 2013-08-02 NOTE — Plan of Care (Signed)
Problem: Safety  Goal: Patient will be free from injury during hospitalization  Outcome: Progressing  Bed alarm on.  Pt was instructed to call if needed to go to restroom and she verbalized understanding.  Will continue to monitor.    Problem: Pain  Goal: Patient's pain/discomfort is manageable  Outcome: Progressing  Denies any pain at this time.  Pt was instructed to call if there' recurrence of chest pain nor sob.  SR on the monitor (90's).  Will continue to monitor.

## 2013-08-02 NOTE — Progress Notes (Signed)
CM attempted to assess Pt; Pt on HD & currently sleeping.

## 2013-08-02 NOTE — Progress Notes (Signed)
Mary [name] is a 67 y.o. female patient with PMH notable for HTN, DM, MGUS, ESRD on hemodialysis, admitted because of  chest pain, SOB.      Principal Problem:   *Dyspnea  Active Problems:   Chest pain   Pleural effusion    Past Medical History   Diagnosis Date   . Diabetes mellitus without complication    . Hyperlipidemia    . Hypertensive disorder    . Chronic kidney disease      dialysis t-tr-sat   . Arthritis    . Cataracts, bilateral    . GERD (gastroesophageal reflux disease)    . Acute deep vein thrombosis (DVT) of distal vein of left lower extremity    . Hemodialysis access site with mature fistula      Current Facility-Administered Medications   Medication Dose Route Frequency Provider Last Rate Last Dose   . acetaminophen (TYLENOL) tablet 500 mg  500 mg Oral Q6H PRN Trilby Drummer, MD   500 mg at 07/31/13 1715   . amLODIPine (NORVASC) tablet 5 mg  5 mg Oral Daily Timmothy Sours, MD   5 mg at 08/01/13 1000   . atorvastatin (LIPITOR) tablet 10 mg  10 mg Oral QHS Trilby Drummer, MD   10 mg at 08/01/13 2156   . b complex-vitamin c-folic acid (NEPHRO-VITE) tablet 1 tablet  1 tablet Oral Daily Rashidi, Ray Church, MD   1 tablet at 08/01/13 1000   . bisacodyl (DULCOLAX) EC tablet 5 mg  5 mg Oral QD PRN Trilby Drummer, MD   5 mg at 08/01/13 1000   . colchicine tablet 0.6 mg  0.6 mg Oral Daily Timmothy Sours, MD   0.6 mg at 08/01/13 1000   . cyclobenzaprine (FLEXERIL) tablet 10 mg  10 mg Oral TID PRN Rashidi, Ray Church, MD       . dextrose (GLUCOSE) 40 % oral gel 15 g  15 g Oral PRN Rashidi, Ray Church, MD       . dextrose 50 % bolus 25 mL  25 mL Intravenous PRN Rashidi, Ray Church, MD       . gabapentin (NEURONTIN) capsule 300 mg  300 mg Oral Q8H SCH Rashidi, Ray Church, MD   300 mg at 08/02/13 0546   . glucagon (rDNA) (GLUCAGEN) injection 1 mg  1 mg Intramuscular PRN Rashidi, Ray Church, MD       . [EXPIRED] heparin (porcine) injection 1,000 Units  1,000 Units Intracatheter PRN Alric Quan, MD       . insulin aspart (NovoLOG) injection 1-5 Units  1-5 Units Subcutaneous TID AC PRN Trilby Drummer, MD   3 Units at 08/02/13 1750   . insulin glargine (LANTUS) injection 12 Units  12 Units Subcutaneous QHS Laroy Apple, MD   12 Units at 08/01/13 2156   . lidocaine (LMX PLUS 4%) 4 % dressing   Topical PRN Alric Quan, MD   1 each at 08/02/13 0919   . pantoprazole (PROTONIX) EC tablet 40 mg  40 mg Oral QHS Trilby Drummer, MD   40 mg at 08/01/13 2156   . pregabalin (LYRICA) capsule 75 mg  75 mg Oral Q12H [place] Rashidi, Ray Church, MD   75 mg at 08/01/13 2156   . sodium chloride (PF) 0.9 % flush 3 mL  3 mL Intravenous Q8H Rashidi, Amir Adel, MD       . [EXPIRED] sodium chloride 0.9 % bolus 100 mL  100 mL Other Q1H PRN Alric Quan, MD       . [EXPIRED] sodium chloride 0.9 % bolus 250 mL  250 mL Intravenous PRN Alric Quan, MD       . traMADol Veatrice Bourbon) tablet 50 mg  50 mg Oral Q8H PRN Rashidi, Ray Church, MD       . warfarin (COUMADIN) tablet 5 mg  5 mg Oral Daily at 1800 Rashidi, Ray Church, MD   5 mg at 08/01/13 1742     Allergies   Allergen Reactions   . Penicillins Hives   . Shrimp (Shellfish Allergy) Swelling     Blood pressure 119/66, pulse 100, temperature 98.3 F (36.8 C), temperature source Oral, resp. rate 18, height 1.6 m (5\' 3" ), weight 88.7 kg (195 lb 8.8 oz), SpO2 96.00%.    Subjective:  Symptoms:  She reports shortness of breath, chest pain and weakness.    Diet:  Adequate intake.    Activity level: Impaired due to pain.      Objective:  General Appearance:  In no acute distress.    Vital signs: (most recent): Blood pressure 119/66, pulse 100, temperature 98.3 F (36.8 C), temperature source Oral, resp. rate 18, height 1.6 m (5\' 3" ), weight 88.7 kg (195 lb 8.8 oz), SpO2 96.00%.    Output: No urine output and producing stool.    HEENT: Normal HEENT exam.    Lungs:  There are decreased breath sounds.    Heart: Tachycardia.    Extremities: Normal range of  motion.    Neurological: Patient is alert.    Skin:  Warm.    Abdomen: Abdomen is soft.  There is no abdominal tenderness.       Results     Procedure Component Value Units Date/Time    POCT glucose (AC and HS) [id number]  (Abnormal) Collected:08/02/13 1726     POCT Glucose WB 288 (A) mg/dL Updated:08/02/13 [id number]    Basic Metabolic Panel [id number]  (Abnormal) Collected:08/02/13 0731    Specimen Information:Blood Updated:08/02/13 0814     Glucose 156 (H) mg/dL      BUN 65 (H) mg/dL      Creatinine 7.2 (H) mg/dL      CALCIUM 8.8 mg/dL      Sodium 135 (L)      Potassium 4.9      Chloride 97 (L)      CO2 25      Anion Gap 13.0     GFR [id number] Collected:08/02/13 0731     EGFR 6.8 Updated:08/02/13 0814    Prothrombin time/INR [id number]  (Abnormal) Collected:08/02/13 0731    Specimen Information:Blood Updated:08/02/13 0806     PT 17.8 (H)      PT INR 1.5 (H)      PT Anticoag. Given Within 48 hrs. warfarin ([place]     CBC [id number]  (Abnormal) Collected:08/02/13 0731    Specimen Information:Blood / Blood Updated:08/02/13 0753     WBC 12.89 (H)      RBC 2.79 (L)      Hgb 8.3 (L) g/dL      Hematocrit 24.4 (L) %      MCV 87.5 fL      MCH 29.7 pg      MCHC 34.0 g/dL      RDW 14 %      Platelets 325      MPV 10.7 fL      Nucleated RBC 0     POCT glucose (AC and HS) [id number]  (  Abnormal) Collected:08/02/13 0600     POCT Glucose WB 167 (A) mg/dL Updated:08/02/13 0551    POCT glucose (AC and HS) [id number]  (Abnormal) Collected:08/01/13 2138     POCT Glucose WB 341 (A) mg/dL Updated:08/01/13 2141      Echocardiogram Adult Complete W Clr/ Dopp Waveform    08/01/2013  impression: 1. Atrial septal aneurysm is present. 2. There is a small to moderate pericardial effusion noted both anteriorly and posteriorly.  this is only slightly increased from the previous echocardiogram dated 07/20/2013. Variation of tricuspid flow velocity is noted. There is no diastolic collapse of the right ventricle noted. There is organized material  note noted in the apex of the right ventricle not previously reported. Small small pleural effusion is present. Clinical correlation is required considering these echocardiographic changes. 3. There are no other significant echogram echocardiographic changes noted.  Lennon Alstrom, MD  08/01/2013 2:03 PM     Echocardiogram Adult Complete W Color Doppler Waveform    07/20/2013   Normal left ventricular systolic function. No significant valvular heart disease. Small pericardial effusion without evidence of cardiac tamponade.    Audria Nine, MD  07/20/2013 4:09 PM     Ct Abdomen Pelvis Wo Iv/ Wo Po Cont    07/20/2013   1. Interval development of a minimal to moderate pericardial effusion and trace pleural effusions since prior exam one day earlier. 2. No detectable acute abnormalities in the abdomen or pelvis. 3. Contrast residue gallbladder, kidneys, and bladder. 4. Numerous small low-attenuation kidney lesions. 5. Remainder as above. 6. Findings discussed with Dr. Beryle Lathe by Dr. Oren Binet at 3:59 AM.  Sheria Lang, MD  07/20/2013 8:23 AM     Ct Angio Chest    07/19/2013   1. No CT scan evidence for detectable pulmonary embolism. 2. Minimal cardiomegaly. 3. Minimal bibasilar lung atelectasis. 4. Remainder as above.  Sheria Lang, MD  07/19/2013 3:18 PM     Chest Ap Portable    07/30/2013    1. New bilateral pleural effusions with bibasilar airspace opacities. 2. Stable cardiomegaly.  Darin Engels, MD  07/30/2013 4:33 PM     Xr Chest Ap Portable    07/19/2013   Hypoinflation with minimal basilar atelectasis.  Sheria Lang, MD  07/19/2013 2:45 PM     Assessment:  (1.chest pain  2.pericarditis  3.ESRD  4.HTN  5.DM).       Plan:   (1.Hemodialysis  today  2.for D/C home today).       Alric Quan  08/02/2013

## 2013-08-02 NOTE — Plan of Care (Signed)
Problem: Moderate/High Fall Risk Score >/=15  Goal: Patient will remain free of falls  Outcome: Progressing  Bed alarm on. Standard safety precautions are in place. Continue to monitor pt closely and maintain safety/comfort.     Problem: Potential for Compromised Hemodynamic Status  Goal: Stable vital signs and fluid balance  Outcome: Progressing  Pt stable. Currently in HD. No c/o SOB, dizziness, discomfort. Pt very pleasant. Will continue to monitor.

## 2013-08-02 NOTE — Progress Notes (Signed)
Date Time: 08/02/2013 11:10 AM  Patient Name: Mary [name],Mary [name]    Subjective:   Denies any active complaints.  No further episodes of chest pain or shortness of breath.  No palpitations.    No abdominal pain or nausea.    Objective:   VITALS:  Filed Vitals:    08/02/13 1202 08/02/13 1216 08/02/13 1246 08/02/13 1435   BP: 94/56 110/50 127/61    Pulse: 100 98 99    Temp:   97.7 F (36.5 C)    TempSrc:       Resp:       Height:       Weight:       SpO2:    97%     97% on RA  Per RN, pulse ox with activity: 94% on RA    Intake and Output Summary (Last 24 hours) at Date Time    Intake/Output Summary (Last 24 hours) at 08/02/13 0753  Last data filed at 08/02/13 0600   Gross per 24 hour   Intake    620 ml   Output    550 ml   Net     70 ml     PHYSICAL EXAM:  GEN APPEARANCE: No acute distress   HEENT: PERLA; Conjunctiva Clear  NECK: Supple; FROM; no JVD  CVS: RRR, S1, S2; No M/[name]/R  LUNGS: decreased bs bilateral bases R>L; no wheezes or rales  ABD: Soft; NT/ND; Normoactive BS  EXT: trace BLE edema  SKIN: No rash or lesions  NEURO: CN 2-12 intact; No gross focal neurological deficits  PSYCH: Alert and oriented x3. Appropriate affect.     Labs and Diagnostic Data:       Lab 08/02/13 0731 08/01/13 0617 07/31/13 0657   WBC 12.89* 13.60* 13.50*   HGB 8.3* 9.2* 8.3*   HCT 24.4* 27.7* 24.9*   MCV 87.5 89.6 90.2   PLT 325 348 305       Lab 08/02/13 0731 08/01/13 0617 07/31/13 0657 07/30/13 1611   NA 135* 137 134* 133*   K 4.9 4.8 4.4 3.9   CL 97* 95* 94* 96*   CO2 25 25 25 26    BUN 65* 52* 36* 26*   CREAT 7.2* 6.7* 5.4* 4.6*   GLU 156* 194* 302* 236*   CA 8.8 8.8 8.4* 8.6       Results     Procedure Component Value Units Date/Time    Basic Metabolic Panel [id number]  (Abnormal) Collected:08/02/13 0731    Specimen Information:Blood Updated:08/02/13 0814     Glucose 156 (H) mg/dL      BUN 65 (H) mg/dL      Creatinine 7.2 (H) mg/dL      CALCIUM 8.8 mg/dL      Sodium 135 (L)      Potassium 4.9      Chloride 97 (L)      CO2 25       Anion Gap 13.0     GFR [id number] Collected:08/02/13 0731     EGFR 6.8 Updated:08/02/13 0814    Prothrombin time/INR [id number]  (Abnormal) Collected:08/02/13 0731    Specimen Information:Blood Updated:08/02/13 0806     PT 17.8 (H)      PT INR 1.5 (H)      PT Anticoag. Given Within 48 hrs. warfarin ([place]     CBC [id number]  (Abnormal) Collected:08/02/13 0731    Specimen Information:Blood / Blood Updated:08/02/13 0753     WBC 12.89 (H)  RBC 2.79 (L)      Hgb 8.3 (L) [name]/dL      Hematocrit 24.4 (L) %      MCV 87.5 fL      MCH 29.7 pg      MCHC 34.0 [name]/dL      RDW 14 %      Platelets 325      MPV 10.7 fL      Nucleated RBC 0     POCT glucose (AC and HS) [id number]  (Abnormal) Collected:08/02/13 0600     POCT Glucose WB 167 (A) mg/dL Updated:08/02/13 0551    POCT glucose (AC and HS) [id number]  (Abnormal) Collected:08/01/13 2138     POCT Glucose WB 341 (A) mg/dL Updated:08/01/13 2141    POCT glucose (AC and HS) [id number]  (Abnormal) Collected:08/01/13 1600     POCT Glucose WB 340 (A) mg/dL Updated:08/01/13 1759    Prothrombin time/INR [id number]  (Abnormal) Collected:08/01/13 1708    Specimen Information:Blood Updated:08/01/13 1733     PT 15.8 (H)      PT INR 1.3 (H)      PT Anticoag. Given Within 48 hrs. warfarin ([place]ouma         Radiological Studies:   Imaging reviewed.  CXR (8/9): 1. New bilateral pleural effusions with bibasilar airspace opacities. 2. Stable cardiomegaly.  2DE (8/11): 1. Atrial septal aneurysm is present. 2. There is a small to moderate pericardial effusion noted both anteriorly and posteriorly. this is only slightly increased from the previous echocardiogram dated 07/20/2013. Variation of tricuspid flow velocity is noted. There is no diastolic collapse of the right ventricle noted. There is organized material note noted in the apex of the right  ventricle not previously reported. Small pleural effusion is present. Clinical correlation is required considering these  echocardiographic  changes. 3. There are no other significant changes noted.    Current Medications:     Current Facility-Administered Medications   Medication Dose Route Frequency   . amLODIPine  5 mg Oral Daily   . atorvastatin  10 mg Oral QHS   . b complex-vitamin c-folic acid  1 tablet Oral Daily   . colchicine  0.6 mg Oral Daily   . gabapentin  300 mg Oral Q8H [place]   . insulin glargine  12 Units Subcutaneous QHS   . pantoprazole  40 mg Oral QHS   . pregabalin  75 mg Oral Q12H SCH   . sodium chloride (PF)  3 mL Intravenous Q8H   . warfarin  5 mg Oral Daily at 1800   . [DISCONTINUED] insulin glargine  7 Units Subcutaneous QHS     Assessment and Plan:   Mary [name] is a 67 y.o. 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    1. Chest pain, poss 2' to recently diagnosed pericarditis.  Currently resolved.  Appreciate Cardiology asst.  ACS ruled out with 3 sets of neg CE.  CXR with bilateral pleural effusions. 2DE report discussed with Dr. Werner [name] who recommends follow-up imaging regarding organized material noted in apex of right ventricle and confirms no evidence of tamponade.  Case discussed with Dr. Elder [name] and pt has repeat 2DE scheduled on 8/15.  Pt cleared for discharge home by Cardiology.  2. Acute diastolic CHF exacerbation.  Overall improved.  [facility] and  [facility]phrology asst.   3. Pericardial effusion. BP stable.  No JVD appreciated.  2DE with report of small to moderate effusion, no evidence of tamponade.   4. Bilateral pleural effusions.  Continue to monitor closely.  Currently maintaining appropriate O2 sats on RA.  Pt ambulated in hallway with RN and O2 sats 94% after activity.   5. Recent viral pericarditis.  Continue colchicine.   6. ACD.  Continue aranesp.   7. ESRD on HD.  Nephrology,  Dr. Leonette [name], following.  8. HTN.  BP stable.  Continue home meds.   9. IDDM.  Continue lantus.  Continue to monitor and will adjust dosing prn.  Monitor accuchecks and administer SSI prn.    10. HLD.  Continue statin.   11. History of LLE dvt.  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 Pt states that she lost her coumadin prescription 3 days prior to admission.  INR 1.5.  Outpt F/U with Cardiology for further coumadin dose adjustments.   12. Diabetic neuropathy.  Continue gabapentin and lyrica.   13. GI prophylaxis.  PPI.  14. Code status.  Full code.     DISPOSITION:   Medically stable for discharge to home today.  Outpt F/U with Cardiology on 8/15 for repeat 2DE and repeat INR check.  F/U with PCP in 1-2 weeks and Nephrology as previously arranged.     Laroy Apple, MD

## 2013-08-02 NOTE — Progress Notes (Signed)
Pt ambulated room and hall way with no C/O discomfort, dizziness, SOB.  Resting pulse ox = >97% on RA.  Pulse ox with activity = > 94% on RA.

## 2013-08-02 NOTE — Progress Notes (Signed)
[facility] Case Management Initial Discharge Planning Assessment:    This CM/SW met with patient, introduced self and Case Management Services. Demographics verified with Pt    Psychosocial/Demographic Information:  1) Name of person Mary [name], relationship with patient and best contact information: Pt  2) Pt lives with her daughter, Mary [name]  3) Type of Residence is a [facility], Pt has 2 steps into house & 4 steps to her room.  4) Prior level of functioning was independent; Pt uses a cane for ambulation.  5) Insurance information, Medication Coverage on facesheet is verified/facesheet, Pt has medication coverage.  6) Community Connections: Pt has family in the area.  7) Any additional emergency contacts: Mary [name], Daughter [phone])  8) Advanced Directives on the chart? 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 CM suggested that Pt bring it next time she comes to hospital so it can be scanned into EPIC.  9) Healthcare Decision Maker and relationship to patient, best contact information: Mary [name], Daughter [phone])  10) Palliative Care or Hospice involvement? No      DME, SNF, HH needs:  1) Current DME's at home or used in the past: [facility]  2) SNF or Acute Rehab placement prior to admission: None  3) [place] currently being used or used in the past: None  4) Referrals needed:Y or N  None   A) SNF/Acute Rehab/LTACH    B) [facility]   C) TCM   D) [facility]   E) [facility] or [facility]) Rud[place] (referred to [facility] Liaison?)   G) DME - HD Liaison, [place] [facility], notified of admission   I) Others    Discharge Needs:   A) PCP, follow-up appointment made? Dr. Leonette Most confirmed as PCP   B) Transportation Needs: Pt's daughter will provide transportation   C) Others    Plan of Care and possible d/c date explained to patient/family member: D/C Plan discussed with Pt. Plan is to D/C today after HD if medically stable    LACE Index score: ___10____    1st IMM given and signed? First  IMM is in EPIC from 07/31/11  Any discharge barriers: None  Additional Note:

## 2013-08-02 NOTE — Progress Notes (Signed)
Dialysis Treatment Note    Patient:  Mary [name] MRN#:  [id number]  Unit/Room/Bed:  [id number]   Isolation: None       Allergies:   Allergies   Allergen Reactions   . Penicillins Hives   . Shrimp (Shellfish Allergy) Swelling     Code Status: Full Code    Problem List:   Patient Active Problem List    Diagnosis Date Noted   . Dyspnea 07/30/2013   . Chest pain 07/30/2013   . Pleural effusion 07/30/2013   . Viral pericarditis 07/21/2013   . Chest pain at rest 07/19/2013   . Cellulitis of left leg 06/21/2013   . ESRD (end stage renal disease) 06/21/2013   . DM (diabetes mellitus) 06/21/2013   . Hypertension 06/21/2013   . Cellulitis 06/21/2013       Dialysis Order:  Active Orders   Dialysis    Hemodialysis inpatient     Frequency: Once     Start Date/Time: 08/02/13 [id number]     Number of Occurrences:  1 Occurrences     Order Questions:     . Date of Dialysis 08/02/2013     . K+ (Initial) 2 mEq     . KPP (Per Protocol) Yes     . Ca++ 2.5 mEq     . Bicarb 35 mEq     . Na+ 138 mEq     . Dialyzer F160     . Dialysate Temperature (C) 37     . BFR-As tolerated to a maximum of: 350 mL/min     . DFR 700 mL/min     . Duration of Treatment 3.5 Hours     . Fluid Removal (L) and Dry Weight (Kg) 2L     . Access Type-Location AVF-L        Dialysis Treatment Type:    Time out/Safety Check: Time Out/Safety Check Completed: Yes (08/02/13 0900)  [facility] Consent: Consent for HD signed for this hospitalization: Yes (08/02/13 0900)  Blood Consent (if applicable): Blood Consent Verified: Not Applicable ([id number])    Dialysis Access:      Graft/Fistula Left (Active)   Fistula/ Graft Assessment Abnormalities WDL 08/02/2013 12:46 PM   Needle Size 15 Gauge 08/02/2013 12:46 PM   Cannulation Sites held Arterial (min) 5 08/02/2013 12:46 PM   Cannulation Sites held Venous (min) 5 08/02/2013 12:46 PM   Hemostasis Achieved Yes 08/02/2013 12:46 PM       General Assessments:     08/02/13 1246   Treatment Summary   Time Off Machine 1230    Duration of Treatment (Hours) 3.5   Dialyzer Clearance Lightly streaked   Fluid Volume Off (mL) 2500    Prime Volume (mL) 200    Rinseback Volume (mL) 200    Fluid Given: Normal Saline (mL) 100    Fluid Given: PRBC  0 mL   Fluid Given: Albumin (mL) 0    Fluid Given: Other (mL) 0    Total Fluid Given 500    Hemodialysis Net Fluid Removed 2000    Post Treatment Assessment   Patient Response to Treatment 1 episode of sbp below 100, corrected with ns bolus, tolerated dialysis well   Information for Next Treatment no heparin used   Additional Dialyzer Used no    Graft/Fistula Left   No Placement Date or Time found.   Present on Admission?: Yes  Orientation: Left  Graft/Fistula Location: Upper Arm   Fistula/ Graft Assessment Abnormalities WDL  Needle Size 15 Gauge   Cannulation Sites held Arterial (min) 5   Cannulation Sites held Venous (min) 5   Hemostasis Achieved Yes   Vitals   Temp 97.7 F (36.5 C)   Heart Rate 99    BP 127/61 mmHg   Assessment   Mental Status Alert;Oriented;Cooperative   Cardiac Regularity Regular   Cardiac Rhythm Normal Sinus Rhythm   Respiratory  WDL   Respiratory Pattern Regular;Easy   Bilateral Breath Sounds Clear   Cough Non-productive;Spontaneous   General Skin Color Appropriate for ethnicity   Mobility Ambulatory   Education   Person taught Patient   Knowledge basis Substantial   Topics taught Procedure   Teaching Tools Explain   Bedside Nurse Communication   Name of bedside RN - pre dialysis Caryl Pina   Name of bedside RN - post dialysis Caryl Pina      08/02/13 0900   Bedside Nurse Communication   Name of bedside RN - pre dialysis Caryl Pina   Treatment Initiation- With Dialysis Precautions   Time Out/Safety Check Completed Yes   Consent for HD signed for this hospitalization Y   Blood Consent Verified N/A   Dialysis Precautions All Connections Secured;Saline Line Double Clamped;Venous Parameters Set;Arterial Parameters Set;Air Foam Detecctor Engaged   Dialysis Treatment Type Routine;Bedside   Is  patient diabetic? Yes   RO/Hemodialysis Architectural technologist   Is Total Chlorine less than 0.1 ppm? Yes   Orignial Total Chlorine Testing Time 0800   RO/Hemodialysis Archivist Number 1    RO # 1    Water Hardness 0    pH 7.6    Pressure Test Verified Yes   Alarms Verified Passed   Machine Temperature 98.6 F (37 C)   Alarms Verified Yes   Hemodialysis Conductivity (Machine) 14    Hemodialysis Conductivity (Meter) 14.2    Dialyzer Lot Number [id number])   Tubing Lot Number [id number])   RO Machine Log Completed Yes   Hepatitis Status   HBsAg (Antigen) Result Negative   HBsAg Date Drawn 08/31/12   HBsAb (Antibody) Result (>150 08/31/12)   Vitals   Temp 97.6 F (36.4 C)   Heart Rate ! 101    BP 133/64 mmHg   O2 Device None (Room air)   Assessment   Mental Status Alert;Oriented;Cooperative   Cardiac (WDL) WDL   Cardiac Regularity Regular   Cardiac Rhythm Normal Sinus Rhythm   Respiratory  X   Respiratory Pattern Regular;Easy   R Breath Sounds Clear   L Breath Sounds Crackles   Cough Non-productive   General Skin Color Appropriate for ethnicity   Skin Condition/Temp Warm;Dry   Mobility Ambulatory   Graft/Fistula Left   No Placement Date or Time found.   Present on Admission?: Yes  Orientation: Left  Graft/Fistula Location: Upper Arm   Fistula/ Graft Assessment Abnormalities WDL   Needle Size 15 Gauge   Pain Assessment   Charting Type Assessment   Pain Assessment Numeric Scale (0-10)   Pain Score 0   Hemodialysis Comments   Pre-Hemodialysis Comments lidocaine cream applied pre dialysis    Hemodialysis History Information   Outpatient Dialysis Unit [facility]    Outpatient Dialysis Schedule TTS   Outpatient Nephrologist [place]              Pain Assessment: Pain Assessment  Charting Type: Assessment (08/02/13 0900)  Pain Assessment: Numeric Scale (0-10) (08/02/13 0900)  Pain Score: 0-No pain (08/02/13 0900)  POSS Score: Awake and Alert (  08/01/13 2000)  Pain Location: Abdomen;Chest (07/31/13  1809)  Pain Orientation: Mid (07/31/13 1809)  Pain Descriptors: Sore (07/31/13 1809)  Pain Frequency: Continuous (07/31/13 1809)  Effect of Pain on Daily Activities: mild (07/31/13 1809)  Patient's Stated Comfort Functional Goal: 2-mild pain (07/31/13 1809)  Pain Intervention(s): Repositioned (07/31/13 1809)  Multiple Pain Sites: No (07/30/13 1815)    Labs Values:    HBsAg Result:HBsAg (Antigen) Result: Negative (08/02/13 0900)  HBsAg Date Drawn: HBsAg Date Drawn: 08/31/12 (08/02/13 0900)  HBsAg Next Due Date:     Lab Results   Component Value Date    BUN 65* 08/02/2013    NA 135* 08/02/2013    K 4.9 08/02/2013    CREAT 7.2* 08/02/2013    CO2 25 08/02/2013    CA 8.8 08/02/2013    PHOS 5.7* 06/21/2013    GLU 156* 08/02/2013    ALB 2.7* 06/21/2013    HGB 8.3* 08/02/2013    HCT 24.4* 08/02/2013    WBC 12.89* 08/02/2013    PLT 325 08/02/2013    PTT 43* 07/19/2013    PT 17.8* 08/02/2013    INR 1.5* 08/02/2013         Current Diet Order  Diet consistent carbohydrate and cardiac     RO/Hemodialysis Machine Safety Checks - Before Each Treatment:  RO/Hemodialysis Architectural technologist   Machine Number: 1  (08/02/13 0900)  RO #: 1  (08/02/13 0900)  Water Hardness: 0  (08/02/13 0900)  pH: 7.6  (08/02/13 0900)  Pressure Test Verified: Yes (08/02/13 0900)  Alarms Verified: Passed (08/02/13 0900)  Machine Temperature: 98.6 F (37 C) (08/02/13 0900)  Alarms Verified: Yes (08/02/13 0900)  Hemodialysis Conductivity (Machine): 14  (08/02/13 0900)  Hemodialysis Conductivity (Meter): 14.2  (08/02/13 0900)  RO Machine Log Completed: Yes (08/02/13 0900)    Chlorine Testing:  RO/Hemodialysis Machine Safety Checks  Is Total Chlorine less than 0.1 ppm?: Yes (08/02/13 0900)  Orignial Total Chlorine Testing Time: 0800 (08/02/13 0900)    Vitals and Weight:  Patient Vitals for the past 4 hrs:   BP Temp Pulse   08/02/13 1246 127/61 mmHg 97.7 F (36.5 C) 99    08/02/13 1216 110/50 mmHg - 98    08/02/13 1202 94/56 mmHg - 100    08/02/13 1145 119/58 mmHg - 100     08/02/13 1132 128/69 mmHg - 101    08/02/13 1116 123/66 mmHg - 98    08/02/13 1101 135/63 mmHg - 97    08/02/13 1046 133/64 mmHg - 99    08/02/13 1031 124/63 mmHg - 100    08/02/13 1016 125/62 mmHg - 97    08/02/13 1001 124/60 mmHg - 97    08/02/13 0947 117/65 mmHg - 96    08/02/13 0930 122/59 mmHg - 99    08/02/13 0918 123/58 mmHg - 98    08/02/13 0900 133/64 mmHg 97.6 F (36.4 C) 101                 Treatment:     08/02/13 1145 08/02/13 1202 08/02/13 1216   Vitals   Heart Rate 100  100  98    BP 119/58 mmHg 94/56 mmHg 110/50 mmHg   Machine Metrics   Blood Flow Rate (mL/min) 350 mL/min 350 mL/min 350 mL/min   Arterial Pressure (mmHg) -150 mmHg -150 mmHg -150 mmHg   Venous Pressure (mmHg) 130  120  120    Dialysate Flow Rate (mL/min) 700 mL/min 700 mL/min 700 mL/min  Transmembrane Pressure (mmHg) 100 mmHg 100 mmHg 100 mmHg   Ultrafiltration Rate (mL/Hr) 710 mL/hr 710 mL/hr 710 mL/hr   Fluid Removal (ml) 2095 ml 2160 ml 2323 ml   Fluid Bolus (ml) --  100 ml --    Dialysate K (mEq/L) 2 mEq/L 2 mEq/L 2 mEq/L   Dialysate CA (mEq/L) 2.5 mEq/L 2.5 mEq/L 2.5 mEq/L   Hemodialysis Comments   Arteriovenous Lines Secure Yes Yes Yes   Comments pt awake, on the phone  ns bolus for bp support  no complaints       08/02/13 1145 08/02/13 1202 08/02/13 1216   Vitals   Heart Rate 100  100  98    BP 119/58 mmHg 94/56 mmHg 110/50 mmHg   Machine Metrics   Blood Flow Rate (mL/min) 350 mL/min 350 mL/min 350 mL/min   Arterial Pressure (mmHg) -150 mmHg -150 mmHg -150 mmHg   Venous Pressure (mmHg) 130  120  120    Dialysate Flow Rate (mL/min) 700 mL/min 700 mL/min 700 mL/min   Transmembrane Pressure (mmHg) 100 mmHg 100 mmHg 100 mmHg   Ultrafiltration Rate (mL/Hr) 710 mL/hr 710 mL/hr 710 mL/hr   Fluid Removal (ml) 2095 ml 2160 ml 2323 ml   Fluid Bolus (ml) --  100 ml --    Dialysate K (mEq/L) 2 mEq/L 2 mEq/L 2 mEq/L   Dialysate CA (mEq/L) 2.5 mEq/L 2.5 mEq/L 2.5 mEq/L   Hemodialysis Comments   Arteriovenous Lines Secure Yes Yes Yes    Comments pt awake, on the phone  ns bolus for bp support  no complaints       08/02/13 1101 08/02/13 1116 08/02/13 1132   Vitals   Heart Rate 97  98  ! 101    BP 135/63 mmHg 123/66 mmHg 128/69 mmHg   Machine Metrics   Blood Flow Rate (mL/min) 350 mL/min 350 mL/min 350 mL/min   Arterial Pressure (mmHg) -160 mmHg -160 mmHg -160 mmHg   Venous Pressure (mmHg) 160  160  140    Dialysate Flow Rate (mL/min) 700 mL/min 700 mL/min 700 mL/min   Transmembrane Pressure (mmHg) 100 mmHg 100 mmHg 100 mmHg   Ultrafiltration Rate (mL/Hr) 710 mL/hr 710 mL/hr 710 mL/hr   Fluid Removal (ml) 1440 ml 1631 ml 1800 ml   Dialysate K (mEq/L) 2 mEq/L 2 mEq/L 2 mEq/L   Dialysate CA (mEq/L) 2.5 mEq/L 2.5 mEq/L 2.5 mEq/L   Hemodialysis Comments   Arteriovenous Lines Secure Yes Yes Yes   Comments pt resting, responsive, no complaints  --  --       08/02/13 1001 08/02/13 1016 08/02/13 1031   Vitals   Heart Rate 97  97  100    BP 124/60 mmHg 125/62 mmHg 124/63 mmHg   Machine Metrics   Blood Flow Rate (mL/min) 350 mL/min 350 mL/min 350 mL/min   Arterial Pressure (mmHg) -170 mmHg -170 mmHg -170 mmHg   Venous Pressure (mmHg) 130  150  150    Dialysate Flow Rate (mL/min) 700 mL/min 700 mL/min 700 mL/min   Transmembrane Pressure (mmHg) 100 mmHg 100 mmHg 100 mmHg   Ultrafiltration Rate (mL/Hr) 710 mL/hr 710 mL/hr 700 mL/hr   Fluid Removal (ml) 736 ml 907 ml 1088 ml   Dialysate K (mEq/L) 2 mEq/L 2 mEq/L 2 mEq/L   Dialysate CA (mEq/L) 2.5 mEq/L 2.5 mEq/L 2.5 mEq/L   Hemodialysis Comments   Arteriovenous Lines Secure Yes Yes Yes   Comments needles intact  --  --  08/02/13 1046   Vitals   Heart Rate 99    BP 133/64 mmHg   Machine Metrics   Blood Flow Rate (mL/min) 350 mL/min   Arterial Pressure (mmHg) -170 mmHg   Venous Pressure (mmHg) 160    Dialysate Flow Rate (mL/min) 700 mL/min   Transmembrane Pressure (mmHg) 100 mmHg   Ultrafiltration Rate (mL/Hr) 710 mL/hr   Fluid Removal (ml) 1264 ml   Dialysate K (mEq/L) 2 mEq/L   Dialysate CA (mEq/L) 2.5  mEq/L   Hemodialysis Comments   Arteriovenous Lines Secure Yes   Comments --       08/02/13 0900 08/02/13 0918 08/02/13 0930   Vitals   Temp 97.6 F (36.4 C) --  --    Heart Rate ! 101  98  99    BP 133/64 mmHg 123/58 mmHg 122/59 mmHg   O2 Device None (Room air) --  --    Journalist, newspaper Yes --  --    Blood Flow Rate (mL/min) 350 mL/min 350 mL/min 350 mL/min   Arterial Pressure (mmHg) -130 mmHg -160 mmHg -160 mmHg   Venous Pressure (mmHg) 120  110  110    Dialysate Flow Rate (mL/min) 700 mL/min 700 mL/min 700 mL/min   Transmembrane Pressure (mmHg) 100 mmHg 100 mmHg 100 mmHg   Ultrafiltration Rate (mL/Hr) 710 mL/hr 710 mL/hr 710 mL/hr   Fluid Removal (ml) 0 ml 235 ml 400 ml   Dialysate K (mEq/L) 2 mEq/L 2 mEq/L 2 mEq/L   Dialysate CA (mEq/L) 2.5 mEq/L 2.5 mEq/L 2.5 mEq/L   Hemodialysis Comments   Arteriovenous Lines Secure Yes Yes Yes   Comments tx started without difficulty  --  --        08/02/13 0947   Vitals   Temp --    Heart Rate 96    BP 117/65 mmHg   O2 Device --    Journalist, newspaper --    Blood Flow Rate (mL/min) 350 mL/min   Arterial Pressure (mmHg) -160 mmHg   Venous Pressure (mmHg) 110    Dialysate Flow Rate (mL/min) 700 mL/min   Transmembrane Pressure (mmHg) 100 mmHg   Ultrafiltration Rate (mL/Hr) 710 mL/hr   Fluid Removal (ml) 577 ml   Dialysate K (mEq/L) 2 mEq/L   Dialysate CA (mEq/L) 2.5 mEq/L   Hemodialysis Comments   Arteriovenous Lines Secure Yes   Comments pt resting, responsive             Intake/Output:  I/O this shift:  In: 150 [P.O.:150]  Out: 2000 [Other:2000]      Medications:  Current Facility-Administered Medications   Medication Dose Route Last Rate Last Dose   . heparin (porcine) injection 1,000 Units  1,000 Units Intracatheter       . sodium chloride 0.9 % bolus 100 mL  100 mL Other       . sodium chloride 0.9 % bolus 250 mL  250 mL Intravenous            Education:  Education  Person taught: Patient (08/02/13  1246)  Knowledge basis: Substantial (08/02/13 1246)  Topics taught: Procedure (08/02/13 1246)  Teaching Tools: Explain (08/02/13 1246)    Primary Nurse Communication:  Bedside Nurse Communication  Name of bedside RN - pre dialysis: Caryl Pina (08/02/13 1246)  Name of bedside RN - post dialysis: Caryl Pina (08/02/13 1246)      [facility] nurse signature/date/time Reynaldo Minium 08/02/13 1251

## 2013-08-02 NOTE — Final Progress Note (DC Note for stay less than 48 (Addendum)
Patient given written D/C instructions.  Questions asked and answered.  Denies questions/concerns at this time. Informed to call for follow-up appointment's.  IV removed and telemonitor returned.  Awaiting ride home at this time(1840). Daughter to PU pt.

## 2013-08-02 NOTE — Progress Notes (Deleted)
[facility] PROGRESS NOTE  [facility]    Date Time: 08/02/2013 8:57 AM  Patient Name: Mary [name],Mary [name]       Patient Active Problem List   Diagnosis   . Cellulitis of left leg   . ESRD (end stage renal disease)   . DM (diabetes mellitus)   . Hypertension   . Cellulitis   . Chest pain at rest   . Viral pericarditis   . Dyspnea   . Chest pain   . Pleural effusion       Subjective:   Denies chest pain, SOB or palpitations. 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 Has improved  Presumed viral pericarditis.   Subtherapeutic INR on admit   ESRD on HD   Diabetes   HLD   HTN   Anemia   Ruled out for MI   LLE DVT - on coumadin: [place] 05/2013   Echo 08/01/13: NL EF, sm to mod pericardial effusion with fibrinous material (suggesting chronicity). Atrial septal aneurysm. No tamponade physiology  07/20/13 Echo: NL EF, sm to mod pericardial eff 1 cm, no tamponade   CT chest 07/19/13: No dissection aorta   CXR: New bilateral pleural effusions with bibasilar airspace opacities. Stable cardiomegaly.   EKG: Mild sinus tachy 110 BPM, voltages ok, no electrical alternans    Recommendation:    Continue colchicine x 7 day total course. End on 08/08/13.   Repeat echo scheduled in office on 08/05/13   F/U for visit in office next week   Please call if questions      Medications:     Current Facility-Administered Medications   Medication Dose Route Frequency   . amLODIPine  5 mg Oral Daily   . atorvastatin  10 mg Oral QHS   . b complex-vitamin c-folic acid  1 tablet Oral Daily   . colchicine  0.6 mg Oral Daily   . gabapentin  300 mg Oral Q8H Albert   . insulin glargine  12 Units Subcutaneous QHS   . pantoprazole  40 mg Oral QHS   . pregabalin  75 mg Oral Q12H SCH   . sodium chloride (PF)  3 mL Intravenous Q8H   . warfarin  5 mg Oral Daily at 1800   . [DISCONTINUED] insulin glargine  7 Units Subcutaneous QHS           Physical Exam:   BP  135/59  Pulse 100  Temp 98.4 F (36.9 C) (Oral)  Resp 17  Ht 1.6 m (5\' 3" )  Wt 88.7 kg (195 lb 8.8 oz)  BMI 34.65 kg/m2  SpO2 91%      Telemetry reviewed no changes.     Intake and Output Summary (Last 24 hours) at Date Time    Intake/Output Summary (Last 24 hours) at 08/02/13 0857  Last data filed at 08/02/13 0600   Gross per 24 hour   Intake    620 ml   Output    550 ml   Net     70 ml       General Appearance: Breathing comfortably, no acute distress, obese   Head: Normocephalic   Eyes: Extraocular eye movements intact   Neck: No carotid bruit Cannot assess jugular venous distention due to body habitus, brisk carotid upstroke   Lungs: Clear to auscultation throughout except decreased at bases, no wheezes, rhonchi or rales, good  respiratory effort   Chest Wall: No tenderness or deformity   Heart: S1, S2 normal, no S3, no S4, no murmur, PMI not palpable, no rub   Abdomen: Soft, non-tender, positive bowel sounds   Extremities: No cyanosis, clubbing. +lipidema   Pulses: Equal radial pulses, 4/4 symmetric   Neurologic: Alert and oriented x3, mood and affect normal      Labs:      Lab 07/31/13 0657 07/30/13 1611   CK -- 57   TROPI <0.01 <0.01   TROPT -- --   CKMBINDEX -- --     No results found for this basename: DIG in the last 168 hours  No results found for this basename: CHOL:3,TRIG:3,HDL:3,LDL:3 in the last 168 hours  No results found for this basename: BILITOTAL,BILIDIRECT,PROT,ALB,ALT,AST in the last 168 hours  No results found for this basename: MG in the last 168 hours    Lab 08/02/13 0731   PT 17.8*   INR 1.5*   PTT --       Lab 08/02/13 0731 08/01/13 0617 07/31/13 0657   WBC 12.89* 13.60* 13.50*   HGB 8.3* 9.2* 8.3*   HCT 24.4* 27.7* 24.9*   PLT 325 348 305       Lab 08/02/13 0731 08/01/13 0617 07/31/13 0657   NA 135* 137 134*   K 4.9 4.8 4.4   CL 97* 95* 94*   CO2 25 25 25    BUN 65* 52* 36*   CREAT 7.2* 6.7* 5.4*   EGFR 6.8 7.4 9.5   GLU 156* 194* 302*   CA 8.8 8.8 8.4*     No results found for this  basename: TSH,FREET3,FREET4 in the last 168 hours    .  No results found for this basename: BNP        Rads:   Radiological Procedure reviewed.        Signed by: Timmothy Sours MD [place]  NP [place] (8am-5pm)  MD Spectralink [phone] (8am-5pm)  After hours, non urgent consult line [phone]  After Hours, urgent consults [phone]

## 2013-08-02 NOTE — Progress Notes (Signed)
[facility] Progress Note      Date Time: 08/02/2013 8:56 AM  Patient Name: Mary [name],Mary [name]           Subjective:   Feeling better.  Less short of breath.  No chest pain.  Occ. Cough.    Medications:     Current Facility-Administered Medications   Medication Dose Route Frequency Provider Last Rate Last Dose   . acetaminophen (TYLENOL) tablet 500 mg  500 mg Oral Q6H PRN Trilby Drummer, MD   500 mg at 07/31/13 1715   . amLODIPine (NORVASC) tablet 5 mg  5 mg Oral Daily Timmothy Sours, MD   5 mg at 08/01/13 1000   . atorvastatin (LIPITOR) tablet 10 mg  10 mg Oral QHS Trilby Drummer, MD   10 mg at 08/01/13 2156   . b complex-vitamin c-folic acid (NEPHRO-VITE) tablet 1 tablet  1 tablet Oral Daily Rashidi, Ray Church, MD   1 tablet at 08/01/13 1000   . bisacodyl (DULCOLAX) EC tablet 5 mg  5 mg Oral QD PRN Trilby Drummer, MD   5 mg at 08/01/13 1000   . colchicine tablet 0.6 mg  0.6 mg Oral Daily Timmothy Sours, MD   0.6 mg at 08/01/13 1000   . cyclobenzaprine (FLEXERIL) tablet 10 mg  10 mg Oral TID PRN Rashidi, Ray Church, MD       . dextrose (GLUCOSE) 40 % oral gel 15 [name]  15 [name] Oral PRN Rashidi, Ray Church, MD       . dextrose 50 % bolus 25 mL  25 mL Intravenous PRN Rashidi, Ray Church, MD       . gabapentin (NEURONTIN) capsule 300 mg  300 mg Oral Q8H SCH Rashidi, Ray Church, MD   300 mg at 08/02/13 0546   . glucagon (rDNA) (GLUCAGEN) injection 1 mg  1 mg Intramuscular PRN Rashidi, Ray Church, MD       . heparin (porcine) injection 1,000 Units  1,000 Units Intracatheter PRN Alric Quan, MD       . insulin aspart (NovoLOG) injection 1-5 Units  1-5 Units Subcutaneous TID AC PRN Trilby Drummer, MD   4 Units at 08/01/13 2156   . insulin glargine (LANTUS) injection 12 Units  12 Units Subcutaneous QHS Laroy Apple, MD   12 Units at 08/01/13 2156   . lidocaine (LMX PLUS 4%) 4 % dressing   Topical PRN Alric Quan, MD       . pantoprazole (PROTONIX) EC tablet 40 mg  40 mg Oral QHS  Trilby Drummer, MD   40 mg at 08/01/13 2156   . pregabalin (LYRICA) capsule 75 mg  75 mg Oral Q12H [place] Rashidi, Ray Church, MD   75 mg at 08/01/13 2156   . sodium chloride (PF) 0.9 % flush 3 mL  3 mL Intravenous Q8H Rashidi, Amir Adel, MD       . sodium chloride 0.9 % bolus 100 mL  100 mL Other Q1H PRN Alric Quan, MD       . sodium chloride 0.9 % bolus 250 mL  250 mL Intravenous PRN Alric Quan, MD       . traMADol Veatrice Bourbon) tablet 50 mg  50 mg Oral Q8H PRN Rashidi, Ray Church, MD       . warfarin (COUMADIN) tablet 5 mg  5 mg Oral Daily at 1800 Rashidi, Ray Church, MD   5 mg at 08/01/13 1742   . [DISCONTINUED] insulin  glargine (LANTUS) injection 7 Units  7 Units Subcutaneous QHS Trilby Drummer, MD   7 Units at 07/31/13 2147       Physical Exam:     Filed Vitals:    08/02/13 0550   BP: 135/59   Pulse:    Temp: 98.4 F (36.9 C)   Resp: 17   SpO2: 91%     Temp (24hrs), Avg:97.9 F (36.6 C), Min:97 F (36.1 C), Max:98.4 F (36.9 C)           Intake and Output Summary (Last 24 hours) at Date Time    Intake/Output Summary (Last 24 hours) at 08/02/13 0856  Last data filed at 08/02/13 0600   Gross per 24 hour   Intake    620 ml   Output    550 ml   Net     70 ml       General Appearance: Resting comfortable. Weary appearing.  Head:  normocephalic  Eyes:  EOM's intact  Neck:  No carotid bruit or significant jugular venous distension, brisk carotid upstroke  Lungs:  Clear to auscultation throughout, good respiratory effort   Chest Wall:  No tenderness  Heart: normal S1, S2, no S3, S4, murmurs or rubs.  Non-palpable PMI.  Abdomen:  Soft, non-tender, normoactive bowel sounds  Extremities:  No cyanosis, clubbing or edema  Pulses: 2+ distal pulses  Neurologic:  Alert and oriented x3, 4-5+ strength throughout  ENT:  Unremarkable  Mood: Normal      Labs:   Cardiac Enzymes:    Lab 07/31/13 0657 07/30/13 1611   CK -- 57   TROPI <0.01 <0.01   TROPT -- --   CKMBINDEX -- --     No results found for this  basename: TROPONIN,TROPONINT,ISTATTROPONI,CK,CKMB[24 in the last 24 hours    No results found for this basename: DIG in the last 168 hours    Lipid profile:  No results found for this basename: CHOL:3,TRIG:3,HDL:3,LDL:3 in the last 168 hours  No results found for this basename: BILITOTAL,BILIDIRECT,PROT,ALB,ALT,AST in the last 168 hours  No results found for this basename: CHOL,TRIG,HDL,LDLC,VLDLC,LRAT[24 in the last 24 hours    No results found for this basename: MG in the last 168 hours    Lab 08/02/13 0731   PT 17.8*   INR 1.5*   PTT --       Lab 08/02/13 0731 08/01/13 0617 07/31/13 0657   WBC 12.89* 13.60* 13.50*   HGB 8.3* 9.2* 8.3*   HCT 24.4* 27.7* 24.9*   PLT 325 348 305   MCV 87.5 89.6 90.2   MCHC 34.0 33.2 33.3   RDW 14 14 14        Lab 08/02/13 0731 08/01/13 0617 07/31/13 0657   NA 135* 137 134*   K 4.9 4.8 4.4   CL 97* 95* 94*   CO2 25 25 25    BUN 65* 52* 36*   CREAT 7.2* 6.7* 5.4*   EGFR 6.8 7.4 9.5   GLU 156* 194* 302*   CA 8.8 8.8 8.4*   AST -- -- --   ALT -- -- --     No results found for this basename: TSH,FREET3,FREET4 in the last 168 hours    Lab 08/02/13 0731 08/01/13 1708 07/31/13 0651   INR 1.5* 1.3* 1.3*   PTT -- -- --     .  No results found for this basename: BNP        Recent ABG No results found for this  basename: APH,APCO2,APO2,AHCO3,ATCO2,ABE,AOSAT,ABGS,ALLEN,TEMP,FIO2,STATS,O2DEL,O2FLO,RATE,MODE,PRESS,ETCO2,VNTMN,PEEP,PRSUP,TIVOL[24 in the last 24 hours      Radiology:       Echocardiogram Adult Complete W Clr/ Dopp Waveform    08/01/2013  ECHOCARDIOGRAM                                  SONOGRAPHER:  Tilda Burrow, RDCS                        CLINICAL HISTORY-                       MEASUREMENTS       NORMAL RANGE   Left Atrium       45  (15-40 mm)   Aortic Root        30  (20-37 mm)   LV (Diastole)      54  (37-56 mm)   LV (Systole)     26  (20-40 mm)   LVPW (Diastole)     10  (  6-11 mm)   IVS (Diastole)    10  (  6-11 mm)   Right Ventricle     20  (  7-23 mm)   Fractional Shortening      50  ( 24-46  %)   Est. Ejection Fraction    70  (    60    %)   Aortic Arch: The aortic arch is nl             DOPPLER VELOCITIES      NORMAL RANGE Aortic Valve       190  (0.9-1.8 m/s)   Mitral Valve       98  (0.6-1.4 m/s)     Pulmonic Valve    116  (0.5-0.9 m/s)                 PROCEDURE: M-mode, 2D, spectral Doppler and color flow echocardiographic imaging was performed and interpreted.  FINDINGS:  The quality of the study is technically adequate for interpretation.  The left ventricle is normal in size. Left ventricular systolic function is normal. There are no regional wall motion abnormalities. Estimated  EF is 70 %.  There is no left ventricular hypertrophy.  There is no evidence of abnormal diastolic LV function.  The left atrium is dilated  The aortic valve is trileaflet.  Normal valve excursion is present. There is no significant aortic insufficiency. No aortic stenosis is present. The aortic root is normal in size.  The mitral valve is structurally normal. Trace mitral insufficiency is present.  The right ventricle is normal in size and function.  The right atrium is normal in size.  The tricuspid valve is structurally normal. There is trace tricuspid insufficiency.  The pulmonic valve is structurally normal. There is no significant pulmonic insufficiency. There is no evidence of pulmonary hypertension.  A small to moderate pericardial effusion is noted both anteriorly and posteriorly. There is organized material present in the apex this most likely with is is within the pericardial sac. There is a 25-30% inspiratory variation in tricuspid flow velocities. This would suggest increased intrapericardial pressure. There is however no diastolic collapse of the right ventricle. Compared to the echocardiogram 07/20/2013 the effusion is slightly increased in size. The organized material was not present on the previous echocardiograms. Clinical correlation is required considering these echocardiographic changes.   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       08/01/2013  impression: 1. Atrial septal aneurysm is present. 2. There is a small to moderate pericardial effusion noted both anteriorly and posteriorly.  this is only slightly increased from the previous echocardiogram dated 07/20/2013. Variation of tricuspid flow velocity is noted. There is no diastolic collapse of the right ventricle noted. There is organized material note noted in the apex of the right ventricle not previously reported. Small small pleural effusion is present. Clinical correlation is required considering these echocardiographic changes. 3. There are no other significant echogram echocardiographic changes noted.  Lennon Alstrom, MD  08/01/2013 2:03 PM     Echocardiogram Adult Complete W Color Doppler Waveform    07/20/2013  ECHOCARDIOGRAM  SONOGRAPHER:  Tilda Burrow, RDCS  PATIENT HEIGHT:  63         PATIENT WEIGHT:  180      BP: 134/61   MEASUREMENTS      NORMAL RANGE Left Atrium       37  (15-40 mm)    Aortic Root        28  (20-37 mm) LV (Diastole)     41  (37-56 mm) LV (Systole)      24  (20-40 mm) LVPW (Diastole)      11  ( 6-11  mm)   IVS (Diastole)      11  ( 6-11  mm) Right Ventricle      20  ( 7-23  mm)       PROCEDURE: M-mode, 2D, spectral Doppler and color flow echocardiographic imaging was performed and interpreted.  FINDINGS:  The quality of the study is technically adequate for interpretation.  The left ventricle is normal in size. Left ventricular systolic function is normal. There are no regional wall motion abnormalities. Estimated  EF is 60 %.  There is no left ventricular hypertrophy.    The left atrium is normal in size.  The aortic valve is trileaflet.  Normal valve excursion is present. There is no significant aortic insufficiency. No aortic stenosis is present. The aortic root is normal in size.  The mitral valve is structurally normal. No significant mitral insufficiency is present.  The right ventricle is normal in  size and function.  The right atrium is normal in size.  The tricuspid valve is structurally normal. There is no significant tricuspid insufficiency.  The pulmonic valve is not well seen. There is no significant pulmonic insufficiency. There is no evidence of pulmonary hypertension.  There is a small pericardial effusion with a maximum free space of 1 cm anteriorly. There is no evidence of cardiac tamponade.  The atrial septum is mobile. No shunt by color flow Doppler.       07/20/2013   Normal left ventricular systolic function. No significant valvular heart disease. Small pericardial effusion without evidence of cardiac tamponade.    Audria Nine, MD  07/20/2013 4:09 PM     Ct Abdomen Pelvis Wo Iv/ Wo Po Cont    07/20/2013  Reason for exam: 67 year old female with pain.  TECHNIQUE: Spiral CT without contrast. Exam compared with views of the lower chest and upper abdomen from chest CT scan one day earlier.  FINDINGS: In the visualized lower chest, there has been interval development of a minimal to moderate pericardial effusion. Trace pleural effusions have also developed. There is persistent bibasilar atelectasis.  In the abdomen, no detectable abnormalities involve the liver or spleen. The pancreas is mildly  atrophic. Adrenal glands are normal. Contrast residue from previous CT scan is present in the gallbladder and kidneys. The kidneys demonstrate numerous small low-attenuation lesions. There is scattered scarring in the kidneys. There is no hydronephrosis. The intestine reveals no thickening, distention, or perceived inflammation. There is no obstruction or free air. There is no localizing lymphadenopathy or ascites.  In the pelvis, uterus is surgically absent. Bladder is distended with contrast residue.  Arthritis is seen in the spine.      07/20/2013   1. Interval development of a minimal to moderate pericardial effusion and trace pleural effusions since prior exam one day earlier. 2. No detectable acute  abnormalities in the abdomen or pelvis. 3. Contrast residue gallbladder, kidneys, and bladder. 4. Numerous small low-attenuation kidney lesions. 5. Remainder as above. 6. Findings discussed with Dr. Beryle Lathe by Dr. Oren Binet at 3:59 AM.  Sheria Lang, MD  07/20/2013 8:23 AM     Ct Angio Chest    07/19/2013  Reason for exam: 67 year old female with chest pain. Concern for pulmonary embolism.  TECHNIQUE: Spiral CT angiography after IV contrast. 95 cc Visipaque administered. MIPS generated.  FINDINGS: There are no detectable pulmonary emboli. The pulmonary arteries are normal caliber.  The heart is minimally enlarged. Aorta reveals no aneurysm or dissection. There is no pericardial effusion.  The lungs reveal minimal bibasilar atelectasis. No infiltrates are seen. There are no pleural effusions. There is no pneumothorax.  There is mild arthritis in the spine. Thyroid gland is minimally enlarged.      07/19/2013   1. No CT scan evidence for detectable pulmonary embolism. 2. Minimal cardiomegaly. 3. Minimal bibasilar lung atelectasis. 4. Remainder as above.  Sheria Lang, MD  07/19/2013 3:18 PM     Chest Ap Portable    07/30/2013  Clinical history: Chest pain.  Portable AP chest was obtained. Comparison made to prior study dated 07/19/2013.  FINDINGS:  There is stable cardiomegaly. There is interval development of bilateral pleural effusions with bibasilar airspace opacities. There are low lung volumes. No pneumothorax is seen.      07/30/2013    1. New bilateral pleural effusions with bibasilar airspace opacities. 2. Stable cardiomegaly.  Darin Engels, MD  07/30/2013 4:33 PM     Xr Chest Ap Portable    07/19/2013  Reason for exam: 67 year old female with chest pain.  FINDINGS: Portable AP view. The lungs are hypoinflated with minimal basilar atelectasis. The heart is top normal size. There is no pneumothorax.      07/19/2013   Hypoinflation with minimal basilar atelectasis.  Sheria Lang, MD  07/19/2013 2:45 PM           Assessment:    1.Dyspnea  2.Chest pain  3.Pericarditis with small to modrate pericardial effusion  4.ESRD/dialysis  5.Hypertension  6.Anemia  7.Hyperlipidemia  8.LLE DVT (05/2013)  Patient Active Problem List   Diagnosis   . Cellulitis of left leg   . ESRD (end stage renal disease)   . DM (diabetes mellitus)   . Hypertension   . Cellulitis   . Chest pain at rest   . Viral pericarditis   . Dyspnea   . Chest pain   . Pleural effusion       Recommendations:   1.continue colchicine for pericarditis  2.Continue dialysis to maintain proper total body fluid status  3.Consider PRBC given anemia  4.Continue norvasc for hypertension and empirically for coronary insufficiency  5.Continue coumadin for DVT with goal PT-INR 2.0-3.0  6.f/u echocardiogram in one week if remains stable      Signed by: Rivka Spring, M.D., F.A.C.C.

## 2013-08-03 NOTE — Discharge Summary (Signed)
Discharge Date: 08/02/2013     ATTENDING PHYSICIAN:  Porfirio Oar, MD     DISCHARGE DIAGNOSES:  1.  Chest pain, probably secondary to viral pericarditis.  2.  Acute diastolic congestive heart failure exacerbation.  3.  Small to moderate pericardial effusion.  4.  Bilateral pleural effusions.  5.  Recent viral pericarditis.  6.  Anemia of chronic disease.  7.  End-stage renal disease on hemodialysis.  8.  Hypertension.  9.  Insulin-dependent diabetes, poorly controlled.  10.  Hyperlipidemia.  11.  History of left lower extremity deep vein thrombosis.  12.  Diabetic neuropathy.     CONSULTATIONS:  [facility], Dr. Elder Negus      IMAGING STUDIES AND PROCEDURES PERFORMED:  Chest x-ray on August 9 showing new bilateral pleural effusions with  bibasilar airspace opacities and stable cardiomegaly.  Echocardiogram on  August 11 with report noting atrial septal aneurysm present, small to  moderate pericardial effusion noted both anteriorly and posteriorly.  This  is only slightly increased from the previous echocardiogram dated on July  30.  Variation of tricuspid flow velocity is noted.  There is no diastolic  collapse of the right ventricle.  There is report of organized material  noted in the apex of the right ventricle and small pleural effusion is  present.  There are no other significant echocardiogram changes reported.     HISTORY AND HOSPITAL COURSE:  Mary [name] is a 67 year old female well known to our service with past  medical history significant for recent hospitalization secondary to viral  pericarditis with moderate pericardial effusion, end-stage renal disease on  hemodialysis, hypertension, hyperlipidemia, insulin-dependent diabetes, and  previously diagnosed left lower extremity deep vein thrombosis who  presented to the ED on August 9 secondary to recurrent positional chest  discomfort with associated shortness of breath.  For complete details  regarding the patient's presentation at time  of admission, please see  dictated history and physical.  The patient was admitted to telemetry unit  and acute coronary syndrome was ruled out with 3 sets of negative cardiac  enzymes.  [facility], Dr. Elder Negus, was consulted and  actively following during hospital course.  Chest x-ray showed evidence of  bilateral pleural effusions.  She underwent hemodialysis during admission.   A 2-D echocardiogram performed on August 11, showed evidence of small to  moderate pericardial effusion, no evidence of tamponade reported.  The  patient has a repeat echocardiogram scheduled on August 15 with [facility] for further monitoring.  The patient was cleared for discharge home  today with outpatient followup arranged.  At time of discharge, the patient  was medically stable.  She was ambulating in the hallway on room air,  maintaining oxygen saturations around 94%.  At time of discharge, the  patient was medically stable with no further active complaints.     CONDITION AT DISCHARGE:  Stable.     DISPOSITION:  Home with family.     DISCHARGE DIET:  Renal.     DISCHARGE MEDICATIONS:  Please see the patient's medication reconciliation for accurate list of  medications at time of discharge.     DISCHARGE INSTRUCTIONS:  1.  The patient is to follow up with cardiology, [facility], on August  15 for a repeat echocardiogram to be performed.  2.  The patient is to follow up with her primary care doctor within 1 to 2  weeks.  3.  The patient is to follow up with nephrology as  previously arranged, and  to continue hemodialysis as previously scheduled.  4.  The patient to return to the ED or contact MD in the event that she  develops recurrence of symptoms, chest pain, difficulty in breathing,  fevers greater than 101, or any other concerning symptoms.     Greater than 35 minutes spent coordinating discharge planning, discussing  followup care, and performing medication reconciliation.           D:  08/02/2013  17:24 PM by Dr. Moshe Cipro, MD ([id number])  T:  08/03/2013 09:16 AM by Shan Levans      (Conf: [id number]) (Doc ID: [id number])

## 2013-08-07 ENCOUNTER — Inpatient Hospital Stay: Payer: Medicare Other | Admitting: Nephrology

## 2013-08-07 ENCOUNTER — Inpatient Hospital Stay: Payer: Medicare Other

## 2013-08-07 ENCOUNTER — Inpatient Hospital Stay
Admission: AD | Admit: 2013-08-07 | Discharge: 2013-08-15 | DRG: 314 | Disposition: A | Discharge status: Home / Self Care | Payer: Medicare Other | Source: Ambulatory Visit | Attending: Nephrology | Admitting: Nephrology

## 2013-08-07 DIAGNOSIS — D638 Anemia in other chronic diseases classified elsewhere: Secondary | ICD-10-CM | POA: Diagnosis present

## 2013-08-07 DIAGNOSIS — I253 Aneurysm of heart: Secondary | ICD-10-CM | POA: Diagnosis present

## 2013-08-07 DIAGNOSIS — N186 End stage renal disease: Secondary | ICD-10-CM | POA: Diagnosis present

## 2013-08-07 DIAGNOSIS — R0902 Hypoxemia: Secondary | ICD-10-CM | POA: Diagnosis not present

## 2013-08-07 DIAGNOSIS — I509 Heart failure, unspecified: Secondary | ICD-10-CM | POA: Diagnosis present

## 2013-08-07 DIAGNOSIS — E1149 Type 2 diabetes mellitus with other diabetic neurological complication: Secondary | ICD-10-CM | POA: Diagnosis present

## 2013-08-07 DIAGNOSIS — R141 Gas pain: Secondary | ICD-10-CM | POA: Diagnosis not present

## 2013-08-07 DIAGNOSIS — E1129 Type 2 diabetes mellitus with other diabetic kidney complication: Secondary | ICD-10-CM | POA: Diagnosis present

## 2013-08-07 DIAGNOSIS — Z794 Long term (current) use of insulin: Secondary | ICD-10-CM

## 2013-08-07 DIAGNOSIS — I5033 Acute on chronic diastolic (congestive) heart failure: Secondary | ICD-10-CM | POA: Diagnosis present

## 2013-08-07 DIAGNOSIS — H269 Unspecified cataract: Secondary | ICD-10-CM | POA: Diagnosis present

## 2013-08-07 DIAGNOSIS — Z86718 Personal history of other venous thrombosis and embolism: Secondary | ICD-10-CM

## 2013-08-07 DIAGNOSIS — K219 Gastro-esophageal reflux disease without esophagitis: Secondary | ICD-10-CM | POA: Diagnosis present

## 2013-08-07 DIAGNOSIS — E871 Hypo-osmolality and hyponatremia: Secondary | ICD-10-CM | POA: Diagnosis not present

## 2013-08-07 DIAGNOSIS — I120 Hypertensive chronic kidney disease with stage 5 chronic kidney disease or end stage renal disease: Secondary | ICD-10-CM | POA: Diagnosis present

## 2013-08-07 DIAGNOSIS — Z992 Dependence on renal dialysis: Secondary | ICD-10-CM

## 2013-08-07 DIAGNOSIS — Z880 Allergy status to penicillin: Secondary | ICD-10-CM

## 2013-08-07 DIAGNOSIS — I319 Disease of pericardium, unspecified: Principal | ICD-10-CM | POA: Diagnosis present

## 2013-08-07 DIAGNOSIS — E785 Hyperlipidemia, unspecified: Secondary | ICD-10-CM | POA: Diagnosis present

## 2013-08-07 DIAGNOSIS — L02419 Cutaneous abscess of limb, unspecified: Secondary | ICD-10-CM | POA: Diagnosis present

## 2013-08-07 DIAGNOSIS — R143 Flatulence: Secondary | ICD-10-CM | POA: Diagnosis not present

## 2013-08-07 DIAGNOSIS — Z91013 Allergy to seafood: Secondary | ICD-10-CM

## 2013-08-07 DIAGNOSIS — Z7901 Long term (current) use of anticoagulants: Secondary | ICD-10-CM

## 2013-08-07 LAB — CBC
Hematocrit: 24.1 % — ABNORMAL LOW (ref 37.0–47.0)
Hgb: 7.9 g/dL — ABNORMAL LOW (ref 12.0–16.0)
MCH: 29.7 pg (ref 28.0–32.0)
MCHC: 32.8 g/dL (ref 32.0–36.0)
MCV: 90.6 fL (ref 80.0–100.0)
MPV: 11 fL (ref 9.4–12.3)
Nucleated RBC: 0 (ref 0–1)
Platelets: 257 (ref 140–400)
RBC: 2.66 — ABNORMAL LOW (ref 4.20–5.40)
RDW: 14 % (ref 12–15)
WBC: 6.64 (ref 3.50–10.80)

## 2013-08-07 LAB — PT/INR
PT INR: 1.3 — ABNORMAL HIGH (ref 0.9–1.1)
PT: 15.8 — ABNORMAL HIGH (ref 12.6–15.0)

## 2013-08-07 LAB — TROPONIN I
Troponin I: <0.01 ng/mL (ref 0.00–0.09)
Troponin I: <0.01 ng/mL (ref 0.00–0.09)

## 2013-08-07 LAB — HEMOLYSIS INDEX: Hemolysis Index: 10 — ABNORMAL HIGH (ref 0–9)

## 2013-08-07 LAB — APTT
PTT: 31 (ref 23–37)
PTT: 114 — ABNORMAL HIGH (ref 23–37)

## 2013-08-07 LAB — HEPATITIS B SURFACE ANTIBODY: HEPATITIS B SURFACE ANTIBODY: <8

## 2013-08-07 LAB — HEPATITIS B SURFACE ANTIGEN W/ REFLEX TO CONFIRMATION: Hepatitis B Surface Antigen: NONREACTIVE

## 2013-08-07 LAB — CK
Creatine Kinase (CK): 34 U/L (ref 29–168)
Creatine Kinase (CK): 31 U/L (ref 29–168)

## 2013-08-07 MED ORDER — ASPIRIN 81 MG PO CHEW
81.0000 mg | CHEWABLE_TABLET | Freq: Every day | ORAL | Status: DC
Start: 2013-08-07 — End: 2013-08-19
  Administered 2013-08-08 – 2013-08-19 (×12): 81 mg via ORAL
  Filled 2013-08-07 (×13): qty 1

## 2013-08-07 MED ORDER — GABAPENTIN 300 MG PO CAPS
300.0000 mg | ORAL_CAPSULE | Freq: Three times a day (TID) | ORAL | Status: DC
Start: 2013-08-07 — End: 2013-08-19
  Administered 2013-08-07 – 2013-08-19 (×33): 300 mg via ORAL
  Filled 2013-08-07 (×34): qty 1

## 2013-08-07 MED ORDER — PREGABALIN 75 MG PO CAPS
75.0000 mg | ORAL_CAPSULE | Freq: Two times a day (BID) | ORAL | Status: DC
Start: 2013-08-07 — End: 2013-08-19
  Administered 2013-08-07 – 2013-08-19 (×22): 75 mg via ORAL
  Filled 2013-08-07 (×23): qty 1

## 2013-08-07 MED ORDER — BISACODYL 5 MG PO TBEC
5.0000 mg | DELAYED_RELEASE_TABLET | Freq: Every day | ORAL | Status: DC
Start: 2013-08-07 — End: 2013-08-19
  Administered 2013-08-07 – 2013-08-19 (×13): 5 mg via ORAL
  Filled 2013-08-07 (×14): qty 1

## 2013-08-07 MED ORDER — PANTOPRAZOLE SODIUM 40 MG PO TBEC
40.0000 mg | DELAYED_RELEASE_TABLET | Freq: Every morning | ORAL | Status: DC
Start: 2013-08-07 — End: 2013-08-19
  Administered 2013-08-08 – 2013-08-19 (×12): 40 mg via ORAL
  Filled 2013-08-07 (×12): qty 1

## 2013-08-07 MED ORDER — AMLODIPINE BESYLATE 5 MG PO TABS
5.0000 mg | ORAL_TABLET | Freq: Every day | ORAL | Status: DC
Start: 2013-08-07 — End: 2013-08-13
  Administered 2013-08-09 – 2013-08-12 (×3): 5 mg via ORAL
  Filled 2013-08-07 (×5): qty 1

## 2013-08-07 MED ORDER — TRAMADOL HCL 50 MG PO TABS
50.0000 mg | ORAL_TABLET | Freq: Four times a day (QID) | ORAL | Status: DC | PRN
Start: 2013-08-07 — End: 2013-08-19
  Administered 2013-08-07 – 2013-08-16 (×12): 50 mg via ORAL
  Filled 2013-08-07 (×12): qty 1

## 2013-08-07 MED ORDER — NEPHRO-VITE 0.8 MG PO TABS
1.0000 | ORAL_TABLET | Freq: Every day | ORAL | Status: DC
Start: 2013-08-07 — End: 2013-08-19
  Administered 2013-08-08 – 2013-08-19 (×12): 1 via ORAL
  Filled 2013-08-07 (×12): qty 1

## 2013-08-07 MED ORDER — LIDOCAINE-TRANSPARENT DRESSING 4 % EX KIT
PACK | CUTANEOUS | Status: DC | PRN
Start: 2013-08-07 — End: 2013-08-13
  Administered 2013-08-07 – 2013-08-09 (×2): 1 via TOPICAL
  Filled 2013-08-07 (×2): qty 1

## 2013-08-07 MED ORDER — COLCHICINE 0.6 MG PO TABS
0.6000 mg | ORAL_TABLET | Freq: Every day | ORAL | Status: DC
Start: 2013-08-07 — End: 2013-08-19
  Administered 2013-08-08 – 2013-08-19 (×12): 0.6 mg via ORAL
  Filled 2013-08-07 (×12): qty 1

## 2013-08-07 MED ORDER — ACETAMINOPHEN 325 MG PO TABS
650.0000 mg | ORAL_TABLET | Freq: Four times a day (QID) | ORAL | Status: DC | PRN
Start: 2013-08-07 — End: 2013-08-19

## 2013-08-07 MED ORDER — INSULIN GLARGINE 100 UNIT/ML SC SOLN
12.0000 [IU] | Freq: Every evening | SUBCUTANEOUS | Status: DC
Start: 2013-08-07 — End: 2013-08-19
  Administered 2013-08-07 – 2013-08-19 (×12): 12 [IU] via SUBCUTANEOUS
  Filled 2013-08-07 (×2): qty 120
  Filled 2013-08-07 (×2): qty 10
  Filled 2013-08-07 (×2): qty 120

## 2013-08-07 MED ORDER — SODIUM CHLORIDE 0.9 % IV BOLUS
250.0000 mL | INTRAVENOUS | Status: AC | PRN
Start: 2013-08-07 — End: 2013-08-07

## 2013-08-07 MED ORDER — HEPARIN SODIUM (PORCINE) 1000 UNIT/ML IJ SOLN
1000.0000 [IU] | INTRAMUSCULAR | Status: AC | PRN
Start: 2013-08-07 — End: 2013-08-07
  Administered 2013-08-07: 1000 [IU]
  Filled 2013-08-07: qty 1

## 2013-08-07 MED ORDER — SODIUM CHLORIDE 0.9 % IV BOLUS
100.0000 mL | INTRAVENOUS | Status: AC | PRN
Start: 2013-08-07 — End: 2013-08-07

## 2013-08-07 MED ORDER — ATORVASTATIN CALCIUM 20 MG PO TABS
10.0000 mg | ORAL_TABLET | Freq: Every day | ORAL | Status: DC
Start: 2013-08-07 — End: 2013-08-19
  Administered 2013-08-08 – 2013-08-18 (×10): 10 mg via ORAL
  Filled 2013-08-07 (×11): qty 1

## 2013-08-07 MED ORDER — HEPARIN (PORCINE) IN D5W 50-5 UNIT/ML-% IV SOLN
1000.0000 [IU]/h | INTRAVENOUS | Status: DC
Start: 2013-08-07 — End: 2013-08-08
  Administered 2013-08-07: 1000 [IU]/h via INTRAVENOUS
  Administered 2013-08-08: 700 [IU]/h via INTRAVENOUS

## 2013-08-07 MED ORDER — HEPARIN SODIUM (PORCINE) 1000 UNIT/ML IJ SOLN
3000.0000 [IU] | Freq: Once | INTRAMUSCULAR | Status: AC
Start: 2013-08-07 — End: 2013-08-07
  Administered 2013-08-07: 3000 [IU] via INTRAVENOUS
  Filled 2013-08-07: qty 3

## 2013-08-07 NOTE — Progress Notes (Signed)
Dialysis Treatment Note    Patient:  Mary [name] MRN#:  [id number]  Unit/Room/Bed:  [id number]   Isolation: None       Allergies:   Allergies   Allergen Reactions   . Penicillins Hives   . Shrimp (Shellfish Allergy) Swelling     Code Status: Full Code    Problem List:   Patient Active Problem List    Diagnosis Date Noted   . Dyspnea 07/30/2013   . Chest pain 07/30/2013   . Pleural effusion 07/30/2013   . Viral pericarditis 07/21/2013   . Chest pain at rest 07/19/2013   . Cellulitis of left leg 06/21/2013   . ESRD (end stage renal disease) 06/21/2013   . DM (diabetes mellitus) 06/21/2013   . Hypertension 06/21/2013   . Cellulitis 06/21/2013       Dialysis Order:  Active Orders   Dialysis    Hemodialysis inpatient     Frequency: Once     Start Date/Time: 08/07/13 1041     Number of Occurrences:  1 Occurrences     Order Questions:     . Date of Dialysis 08/07/2013     . K+ (Initial) 2 mEq     . KPP (Per Protocol) Yes     . Ca++ 2.5 mEq     . Bicarb 35 mEq     . Na+ 138 mEq     . Dialyzer F160     . Dialysate Temperature (C) 37     . BFR-As tolerated to a maximum of: 350 mL/min     . DFR 700 mL/min     . Duration of Treatment 3 Hours     . Fluid Removal (L) and Dry Weight (Kg) 2L     . If available, Nurse may use Crit Line to guide U/F rate Yes        Dialysis Treatment Type:    Time out/Safety Check: Time Out/Safety Check Completed: Yes (08/07/13 1130)  [facility] Consent: Consent for HD signed for this hospitalization: Yes (08/07/13 1130)  Blood Consent (if applicable): Blood Consent Verified: Not Applicable ([id number])    Dialysis Access:      Graft/Fistula Left (Active)   Fistula/ Graft Assessment Abnormalities WDL 08/07/2013  2:55 PM   Needle Size 15 Gauge 08/07/2013 11:55 AM   Cannulation Sites held Arterial (min) 15 08/07/2013  2:55 PM   Cannulation Sites held Venous (min) 15 08/07/2013  2:55 PM   Hemostasis Achieved Yes 08/07/2013  2:55 PM       General Assessments:       08/07/13 1130   Bedside Nurse  Communication   Name of bedside RN - pre dialysis Katharina Caper RN   Treatment Initiation- With Dialysis Precautions   Time Out/Safety Check Completed Yes   Consent for HD signed for this hospitalization Y   Blood Consent Verified N/A   Dialysis Precautions All Connections Secured;Saline Line Double Clamped;Venous Parameters Set;Arterial Parameters Set;Air Foam Detecctor Engaged   Dialysis Treatment Type Bedside;Routine   Is patient diabetic? Yes   RO/Hemodialysis Architectural technologist   Is Total Chlorine less than 0.1 ppm? Yes   Orignial Total Chlorine Testing Time 1130   RO/Hemodialysis Archivist Number 1    RO # 1    Water Hardness 0    pH 7.6    Pressure Test Verified Yes   Alarms Verified Passed   Machine Temperature 98.6 F (37 C)   Alarms Verified Yes  Hemodialysis Conductivity (Machine) 14.1    Hemodialysis Conductivity (Meter) 14.2    Dialyzer Lot Number [id number])   Tubing Lot Number [id number])   RO Machine Log Completed Yes   Hepatitis Status   HBsAg Date Drawn 08/07/13   HBsAb (Antibody) Result Reactive  (08/31/12)   Dialysis Weight   Pre-Treatment Weight (Kg) 95.1    Scale Type In Bed Sling Scale   Vitals   Temp ! 96.9 F (36.1 C)   Heart Rate 84    Resp Rate 18    BP 116/65 mmHg   SpO2 99 %   O2 Device Nasal cannula  (3L)   Assessment   Mental Status Alert;Oriented;Cooperative;Follows Commands   Cardiac (WDL) X   Cardiac Regularity Regular   Cardiac Rhythm Normal Sinus Rhythm   Respiratory  WDL   Respiratory Pattern Regular   Bilateral Breath Sounds Diminished   Cough Non-productive   Edema  X   Generalized Edema Non Pitting Edema   General Skin Color Appropriate for ethnicity   Skin Condition/Temp Warm;Dry   Gastrointestinal (WDL) WDL   Abdomen Inspection Soft;Nondistended   GI Symptoms None   Mobility Bed   Graft/Fistula Left   No Placement Date or Time found.   Present on Admission?: Yes  Orientation: Left  Graft/Fistula Location: Upper Arm   Fistula/ Graft Assessment  Abnormalities WDL   Pain Assessment   Charting Type Assessment   Pain Assessment Numeric Scale (0-10)   Pain Score 7   POSS Score 1   Pain Location Abdomen   Pain Orientation Right;Lower   Pain Descriptors Squeezing   Pain Frequency Continuous   Patient's Stated Comfort Functional Goal 2   Pain Intervention(s) Medication (See eMAR)   Hemodialysis Comments   Pre-Hemodialysis Comments Need Labs Drawn STAT   Hemodialysis History Information   Outpatient Nephrologist [facility]      08/07/13 1455   Treatment Summary   Time Off Machine 1455   Duration of Treatment (Hours) 3   Dialyzer Clearance Moderately streaked   Fluid Volume Off (mL) 2700    Prime Volume (mL) 200    Rinseback Volume (mL) 200    Fluid Given: Normal Saline (mL) 250    Fluid Given: PRBC  0 mL   Fluid Given: Albumin (mL) 0    Fluid Given: Other (mL) 0    Total Fluid Given 650    Hemodialysis Net Fluid Removed 2050    Post Treatment Assessment   Post-Treatment Weight (Kg) 92.9    Patient Response to Treatment Pt SBP <90 1X during HD TX;    Additional Dialyzer Used NA   Graft/Fistula Left   No Placement Date or Time found.   Present on Admission?: Yes  Orientation: Left  Graft/Fistula Location: Upper Arm   Fistula/ Graft Assessment Abnormalities WDL   Cannulation Sites held Arterial (min) 15   Cannulation Sites held Venous (min) 15   Hemostasis Achieved Yes   Vitals   Heart Rate 79    Resp Rate 18    BP 113/55 mmHg   SpO2 98 %   O2 Device Nasal cannula  (3L)   Assessment   Mental Status Alert;Oriented;Cooperative;Follows Commands   Cardiac (WDL) X   Cardiac Regularity Regular   Cardiac Rhythm Normal Sinus Rhythm   Respiratory  WDL   Respiratory Pattern Regular   Bilateral Breath Sounds Diminished   Cough Non-productive   Edema  X   Generalized Edema Non Pitting Edema   General Skin Color Appropriate for  ethnicity   Skin Condition/Temp Warm;Dry   Gastrointestinal (WDL) WDL   Abdomen Inspection Soft;Nondistended   GI Symptoms None   Mobility Bed   Education    Person taught Other (Comments)  (daughter)   Knowledge basis Minimal   Topics taught Access care;Procedure;S&S of infection   Teaching Tools Explain   Bedside Nurse Communication   Name of bedside RN - post dialysis Katharina Caper RN           Pain Assessment: Pain Assessment  Charting Type: Assessment (08/07/13 1130)  Pain Assessment: Numeric Scale (0-10) (08/07/13 1130)  Pain Score: 7-severe pain (08/07/13 1130)  POSS Score: Awake and Alert (08/07/13 1130)  Pain Location: Abdomen (08/07/13 1130)  Pain Orientation: Right;Lower (08/07/13 1130)  Pain Descriptors: Squeezing (08/07/13 1130)  Pain Frequency: Continuous (08/07/13 1130)  Effect of Pain on Daily Activities: moderate (08/07/13 0500)  Patient's Stated Comfort Functional Goal: 2-mild pain (08/07/13 1130)  Pain Intervention(s): Medication (See eMAR) (08/07/13 1130)    Labs Values:    HBsAg Result:   HBsAg Date Drawn: HBsAg Date Drawn: 08/07/13 (08/07/13 1130)  HBsAg Next Due Date:     Lab Results   Component Value Date    BUN 65* 08/02/2013    NA 135* 08/02/2013    K 4.9 08/02/2013    CREAT 7.2* 08/02/2013    CO2 25 08/02/2013    CA 8.8 08/02/2013    PHOS 5.7* 06/21/2013    GLU 156* 08/02/2013    ALB 2.7* 06/21/2013    HGB 7.9* 08/07/2013    HCT 24.1* 08/07/2013    WBC 6.64 08/07/2013    PLT 257 08/07/2013    PTT 31 08/07/2013    PT 15.8* 08/07/2013    INR 1.3* 08/07/2013         Current Diet Order  Diet cardiac Fluid restriction:: 1500 ML FLUID; Na restriction, if any:: 2 GM NA     RO/Hemodialysis Machine Safety Checks - Before Each Treatment:  RO/Hemodialysis Archivist Number: 1  (08/07/13 1130)  RO #: 1  (08/07/13 1130)  Water Hardness: 0  (08/07/13 1130)  pH: 7.6  (08/07/13 1130)  Pressure Test Verified: Yes (08/07/13 1130)  Alarms Verified: Passed (08/07/13 1130)  Machine Temperature: 98.6 F (37 C) (08/07/13 1130)  Alarms Verified: Yes (08/07/13 1130)  Hemodialysis Conductivity (Machine): 14.1  (08/07/13 1130)  Hemodialysis Conductivity (Meter):  14.2  (08/07/13 1130)  RO Machine Log Completed: Yes (08/07/13 1130)    Chlorine Testing:  RO/Hemodialysis Machine Safety Checks  Is Total Chlorine less than 0.1 ppm?: Yes (08/07/13 1130)  Orignial Total Chlorine Testing Time: 1130 (08/07/13 1130)    Vitals and Weight:  Patient Vitals for the past 4 hrs:   BP Pulse Resp   08/07/13 1455 113/55 mmHg 79  18    08/07/13 1445 105/51 mmHg 80  18    08/07/13 1430 105/52 mmHg 79  18    08/07/13 1415 110/64 mmHg 80  18    08/07/13 1400 117/59 mmHg 80  18    08/07/13 1345 115/57 mmHg 80  18    08/07/13 1330 108/62 mmHg 78  18    08/07/13 1315 111/64 mmHg 77  18    08/07/13 1305 100/57 mmHg 79  18    08/07/13 1300 84/54 mmHg 80  18    08/07/13 1245 97/53 mmHg 85  18    08/07/13 1230 113/60 mmHg 82  18    08/07/13 1215 119/66 mmHg 82  18    08/07/13 1155 123/59 mmHg 82  18         Dialysis Weight  Pre-Treatment Weight (Kg): 95.1  (08/07/13 1130)  Scale Type: In Bed Sling Scale (08/07/13 1130)  Post-Treatment Weight (Kg): 92.9  (08/07/13 1455)    Treatment:     08/07/13 1155   Graft/Fistula Left   No Placement Date or Time found.   Present on Admission?: Yes  Orientation: Left  Graft/Fistula Location: Upper Arm   Needle Size 15 Gauge   Vitals   Heart Rate 82    Resp Rate 18    BP 123/59 mmHg   Machine Metrics   $Treatment Started/Capturing Charge Yes   Blood Flow Rate (mL/min) 350 mL/min   Arterial Pressure (mmHg) -130 mmHg   Venous Pressure (mmHg) 120    Dialysate Flow Rate (mL/min) 700 mL/min   Transmembrane Pressure (mmHg) 100 mmHg   Ultrafiltration Rate (mL/Hr) 800 mL/hr   Fluid Removal (ml) 0 ml   Fluid Bolus (ml) 200 ml   Dialysate K (mEq/L) 2 mEq/L   Dialysate CA (mEq/L) 2.5 mEq/L   Hemodialysis Comments   Arteriovenous Lines Secure Yes   Comments HD Tx Started w/o complications      [id number] 08/07/13 1230 08/07/13 1245   Vitals   Heart Rate 82  82  85    Resp Rate 18  18  18     BP 119/66 mmHg 113/60 mmHg 97/53 mmHg   Machine Metrics   Blood Flow Rate (mL/min) 350  mL/min 350 mL/min 350 mL/min   Arterial Pressure (mmHg) -140 mmHg -140 mmHg -150 mmHg   Venous Pressure (mmHg) 130  130  130    Dialysate Flow Rate (mL/min) 700 mL/min 700 mL/min 700 mL/min   Transmembrane Pressure (mmHg) 110 mmHg 110 mmHg 110 mmHg   Ultrafiltration Rate (mL/Hr) 800 mL/hr 800 mL/hr 800 mL/hr   Fluid Removal (ml) 260 ml 480 ml 670 ml   Fluid Bolus (ml) 0 ml 0 ml 0 ml   Hemodialysis Comments   Arteriovenous Lines Secure Yes Yes Yes   Comments pt vss; md @ bedside pt vss; daughter @ bedside pt vss       08/07/13 1300   Vitals   Heart Rate 80    Resp Rate 18    BP ! 84/54 mmHg   Machine Metrics   Blood Flow Rate (mL/min) 350 mL/min   Arterial Pressure (mmHg) -150 mmHg   Venous Pressure (mmHg) 130    Dialysate Flow Rate (mL/min) 700 mL/min   Transmembrane Pressure (mmHg) 100 mmHg   Ultrafiltration Rate (mL/Hr) 800 mL/hr   Fluid Removal (ml) 850 ml   Fluid Bolus (ml) 0 ml   Hemodialysis Comments   Arteriovenous Lines Secure Yes   Comments pt awake; sbp<90      08/07/13 1305 08/07/13 1315 08/07/13 1330   Vitals   Heart Rate 79  77  78    Resp Rate 18  18  18     BP 100/57 mmHg 111/64 mmHg 108/62 mmHg   Machine Metrics   Blood Flow Rate (mL/min) 350 mL/min 350 mL/min 350 mL/min   Arterial Pressure (mmHg) -140 mmHg -150 mmHg -150 mmHg   Venous Pressure (mmHg) 130  130  130    Dialysate Flow Rate (mL/min) 700 mL/min 700 mL/min 700 mL/min   Transmembrane Pressure (mmHg) 100 mmHg 110 mmHg 100 mmHg   Ultrafiltration Rate (mL/Hr) 800 mL/hr 980 mL/hr 980 mL/hr   Fluid Removal (ml)  920 ml 1060 ml 1310 ml   Fluid Bolus (ml) 250 ml 0 ml 0 ml   Hemodialysis Comments   Arteriovenous Lines Secure Yes Yes Yes   Comments pt vss; primary rn at bedside pt vss; increase ufg pt vss; primary rn @ bedside       08/07/13 1345   Vitals   Heart Rate 80    Resp Rate 18    BP 115/57 mmHg   Machine Metrics   Blood Flow Rate (mL/min) 350 mL/min   Arterial Pressure (mmHg) -150 mmHg   Venous Pressure (mmHg) 150    Dialysate Flow Rate  (mL/min) 700 mL/min   Transmembrane Pressure (mmHg) 90 mmHg   Ultrafiltration Rate (mL/Hr) 980 mL/hr   Fluid Removal (ml) 1550 ml   Fluid Bolus (ml) 0 ml   Hemodialysis Comments   Arteriovenous Lines Secure Yes   Comments pt vss      08/07/13 1400 08/07/13 1415 08/07/13 1430   Vitals   Heart Rate 80  80  79    Resp Rate 18  18  18     BP 117/59 mmHg 110/64 mmHg 105/52 mmHg   Machine Metrics   Blood Flow Rate (mL/min) 350 mL/min 350 mL/min 350 mL/min   Arterial Pressure (mmHg) -150 mmHg -150 mmHg -160 mmHg   Venous Pressure (mmHg) 150  150  150    Dialysate Flow Rate (mL/min) 700 mL/min 700 mL/min 700 mL/min   Transmembrane Pressure (mmHg) 90 mmHg 90 mmHg 90 mmHg   Ultrafiltration Rate (mL/Hr) 980 mL/hr 1060 mL/hr 1060 mL/hr   Fluid Removal (ml) 1810 ml 2060 ml 2310 ml   Fluid Bolus (ml) 0 ml 0 ml 0 ml   Hemodialysis Comments   Arteriovenous Lines Secure Yes Yes Yes   Comments pt vss; pt watching tv pt vss; increase ufg pt vss       08/07/13 1445   Vitals   Heart Rate 80    Resp Rate 18    BP 105/51 mmHg   Machine Metrics   Blood Flow Rate (mL/min) 350 mL/min   Arterial Pressure (mmHg) -160 mmHg   Venous Pressure (mmHg) 150    Dialysate Flow Rate (mL/min) 700 mL/min   Transmembrane Pressure (mmHg) 90 mmHg   Ultrafiltration Rate (mL/Hr) 1060 mL/hr   Fluid Removal (ml) 2530 ml   Fluid Bolus (ml) 0 ml   Hemodialysis Comments   Arteriovenous Lines Secure Yes   Comments pt vss; pt eyes closed      08/07/13 1455   Graft/Fistula Left   No Placement Date or Time found.   Present on Admission?: Yes  Orientation: Left  Graft/Fistula Location: Upper Arm   Fistula/ Graft Assessment Abnormalities WDL   Cannulation Sites held Arterial (min) 15   Cannulation Sites held Venous (min) 15   Hemostasis Achieved Yes   Vitals   Heart Rate 79    Resp Rate 18    BP 113/55 mmHg   SpO2 98 %   O2 Device Nasal cannula  (3L)   Machine Metrics   Fluid Removal (ml) 2700 ml   Fluid Bolus (ml) 200 ml   Hemodialysis Comments   Arteriovenous Lines  Secure Yes   Comments HD Tx Completed            Intake/Output:  I/O this shift:  In: -   Out: 2050 [Other:2050]      Medications:  Current Facility-Administered Medications   Medication Dose Route Last Rate Last Dose   .  heparin (porcine) injection 1,000 Units  1,000 Units Intracatheter   1,000 Units at 08/07/13 1155   . sodium chloride 0.9 % bolus 100 mL  100 mL Other       . sodium chloride 0.9 % bolus 250 mL  250 mL Intravenous            Education:  Education  Person taught: Other (Comments) (daughter) (08/07/13 1455)  Knowledge basis: Minimal (08/07/13 1455)  Topics taught: Access care;Procedure;S&S of infection (08/07/13 1455)  Teaching Tools: Explain (08/07/13 1455)    Primary Nurse Communication:  Bedside Nurse Communication  Name of bedside RN - pre dialysis: Katharina Caper RN (08/07/13 1130)  Name of bedside RN - post dialysis: Katharina Caper RN (08/07/13 1455)      [facility] nurse signature/date/time:__________________________________________

## 2013-08-07 NOTE — Consults (Addendum)
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 Repeat echo 08/05/13 in our office with no report yet in the system.   Subtherapeutic INR    ESRD on HD   Diabetes   HLD   HTN   Anemia   Ruled out for MI   LLE DVT - on coumadin: [place] 05/2013   Echo 07/20/13: NL EF, sm to mod pericardial eff 1 cm, no tamponade   CT chest 07/19/13: No dissection aorta   CXR: Stable exam with small right pleural effusion and moderate left pleural  effusion.   EKG: none in Epic    Recommendation:      Suspect she has volume overload causing her dyspnea, anasarca, and RUQ abdominal discomfort- treatment per nephrology with dialysis and volume removal as BP tolerates   Check EKG (ordered)   Continue meds   Continue colchicine for recent pericarditis- however, dialysis is likely to benefit this more   F/u on echo results from our office tomorrow (echo performed 08/05/13) for effusion progression; clinically not in tamponade currently   Continue warfarin with Goal INR 2.0-3.0    History:   Mary [name] is a 67 y.o. female who presents to the hospital on 08/07/2013 with onset of dyspnea and chest pressure. She went to the [place] yesterday, had funnel cake, and during exertion had worsening SOB. Recently hospitalized for pericarditis with small-mod pericardial effusion and was taking colchicine. Had RUQ abdominal discomfort, sharp, worse with pressure or movement. No CP, palpitations or syncope.     Past Medical History:     Past Medical History   Diagnosis Date   . Diabetes mellitus without complication    . Hyperlipidemia    . Hypertensive disorder    . Chronic kidney disease      dialysis  t-tr-sat   . Arthritis    . Cataracts, bilateral    . GERD (gastroesophageal reflux disease)    . Acute deep vein thrombosis (DVT) of distal vein of left lower extremity    . Hemodialysis access site with mature fistula        Past Surgical History:     Past Surgical History   Procedure Date   . Av fistula placement 2012       Family History:   History reviewed. No pertinent family history.    Social History:     History     Social History   . Marital Status: Widowed     Spouse Name: N/A     Number of Children: N/A   . Years of Education: N/A     Social History Main Topics   . Smoking status: Never Smoker    . Smokeless tobacco: Not on file   . Alcohol Use: No   . Drug Use: No   . Sexually Active:      Other Topics Concern   . Not on file     Social History Narrative   . No narrative on file       Allergies:     Allergies   Allergen Reactions   . Penicillins Hives   .  Shrimp (Shellfish Allergy) Swelling       Medications:     Prescriptions prior to admission   Medication Sig   . acetaminophen (TYLENOL) 500 MG tablet Take 500 mg by mouth every 6 (six) hours as needed.     Marland Kitchen amLODIPine (NORVASC) 5 MG tablet Take 1 tablet (5 mg total) by mouth daily.   Marland Kitchen aspirin 81 MG tablet Take 81 mg by mouth daily.     Marland Kitchen atorvastatin (LIPITOR) 10 MG tablet Take 10 mg by mouth daily.     Marland Kitchen b complex-vitamin c-folic acid (NEPHRO-VITE) 0.8 MG TABS Take 0.8 mg by mouth.   . bisacodyl (DULCOLAX) 5 MG EC tablet Take 5 mg by mouth daily as needed.     . colchicine 0.6 MG tablet Take 1 tablet (0.6 mg total) by mouth daily.   . cyclobenzaprine (FLEXERIL) 10 MG tablet Take 10 mg by mouth 3 (three) times daily as needed.   Marland Kitchen esomeprazole (NEXIUM) 40 MG capsule Take 40 mg by mouth every morning before breakfast.     . gabapentin (NEURONTIN) 300 MG capsule Take 300 mg by mouth 3 (three) times daily.   . insulin aspart (NOVOLOG) 100 UNIT/ML injection Inject 1-10 Units into the skin 3 (three) times daily before meals.     . insulin glargine  (LANTUS) 100 UNIT/ML injection Inject 10-20 Units into the skin nightly.     . pregabalin (LYRICA) 75 MG capsule Take 75 mg by mouth 2 (two) times daily.     . traMADOL (ULTRAM) 50 MG tablet Take 1 tablet (50 mg total) by mouth every 6 (six) hours as needed for Pain.   . warfarin (COUMADIN) 5 MG tablet Take 1 tablet (5 mg total) by mouth daily.        Review of Systems:   Constitional, head, eyes, ears, nose, throat, musculoskeletal, skin, neuro, endo, psych, heme, all negative unless noted in HPI.    Physical Exam:   BP 123/59  Pulse 82  Temp 96.9 F (36.1 C) (Oral)  Resp 18  Ht 1.626 m (5\' 4" )  Wt 93.441 kg (206 lb)  BMI 35.34 kg/m2  SpO2 99%      Intake and Output Summary (Last 24 hours) at Date Time  No intake or output data in the 24 hours ending 08/07/13 1210    General Appearance:  Breathing comfortably, no acute distress, obese  Head:  Normocephalic  Eyes:  Extraocular eye movements intact  Neck:  No carotid bruit, +JVD, brisk carotid upstroke  Lungs:  Clear to auscultation throughout except decreased at bases, no wheezes, rhonchi or rales, good respiratory effort   Chest Wall:  No tenderness or deformity  Heart:  S1, S2 normal, no S3, no S4, no murmur, PMI not palpable, no rub  Abdomen:  Soft, non-tender, positive bowel sounds  Extremities:  No cyanosis, clubbing  Pulses:  Equal radial pulses, 4/4 symmetric  Neurologic:  Alert and oriented x3, mood and affect normal        Labs Reviewed:     No results found for this basename: CK:3,TROPI:3,TROPT:3,CKMBINDEX:3 in the last 168 hours  No results found for this basename: DIG in the last 168 hours  No results found for this basename: CHOL:3,TRIG:3,HDL:3,LDL:3 in the last 168 hours  No results found for this basename: BILITOTAL,BILIDIRECT,PROT,ALB,ALT,AST in the last 168 hours  No results found for this basename: MG in the last 168 hours    Lab 08/02/13 0731   PT 17.8*   INR  1.5*   PTT --       Lab 08/02/13 0731 08/01/13 0617   WBC 12.89* 13.60*   HGB  8.3* 9.2*   HCT 24.4* 27.7*   PLT 325 348       Lab 08/02/13 0731 08/01/13 0617   NA 135* 137   K 4.9 4.8   CL 97* 95*   CO2 25 25   BUN 65* 52*   CREAT 7.2* 6.7*   EGFR 6.8 7.4   GLU 156* 194*   CA 8.8 8.8     No results found for this basename: TSH,FREET3,FREET4 in the last 168 hours    No results found for this basename: BNP      CXR- Impression:   Stable exam with small right pleural effusion and moderate left pleural  effusion.      Signed by: Sherry Ruffing, MD, MPH, [place]  NP [place] (8am-5pm)  MD Spectralink [phone]  (8am-5pm)  After hours, non urgent consult line [phone]  After Hours, urgent consults [phone]

## 2013-08-07 NOTE — Progress Notes (Signed)
Per low intensity heparin protocol: aptt 114, stopped infusion for 1hr. Decreased dose by 300 units/hr and ordered nxt appt at 0400

## 2013-08-07 NOTE — Consults (Signed)
[facility]   Consultation    Date Time: 08/07/2013 10:23 AM  Patient Name: Mary [name], Mary [name]  Medical Record Number: [id number]   Primary Care Physician: @[email or web address] @   Requesting Physician: Trilby Drummer, MD    Reason for Consultation:   SOB  Kidney failure  Assessment:   Pleural effusion  ESRD  Plan:   Hemodialysis with ultrafiltration    We will follow this patient closely with you. Thank you for allowing [place] to participate in the care of this patient.    Alric Quan, MD  Office - [phone]      History:   Mary [name] is a 67 y.o. female who presents to the hospital on 08/07/2013 with PMH notable for HTN, DM, ESRD, on hemodialysis, T,T,S, recent diagnosis of pericardial effusion.Pt was seen this AM at [facility] because of SOB, chest discomfort. CT chest was reported to show worsening pleural effusion. Renal consult requested to assist with management of kidney failure and fluid overload.    Past Medical and Surgical  History:     Past Medical History   Diagnosis Date   . Diabetes mellitus without complication    . Hyperlipidemia    . Hypertensive disorder    . Chronic kidney disease      dialysis t-tr-sat   . Arthritis    . Cataracts, bilateral    . GERD (gastroesophageal reflux disease)    . Acute deep vein thrombosis (DVT) of distal vein of left lower extremity    . Hemodialysis access site with mature fistula        Family History:     Social History:   No tobacco  No EtOH  No IVDA  Married  Allergies:     Allergies   Allergen Reactions   . Penicillins Hives   . Shrimp (Shellfish Allergy) Swelling     Medications:     Current Facility-Administered Medications   Medication Dose Route Frequency   . amLODIPine  5 mg Oral Daily   . aspirin  81 mg Oral Daily   . atorvastatin  10 mg Oral Daily   . b complex-vitamin c-folic acid  1 tablet Oral Daily   . colchicine  0.6 mg Oral Daily   . gabapentin  300 mg Oral TID   . insulin glargine  12 Units Subcutaneous  QHS   . pantoprazole  40 mg Oral QAM AC   . pregabalin  75 mg Oral Q12H [place]     Review of Systems:   12 systems were reviewed and were negative except for the following:SOB  Physical Exam:   Temp:  [96.6 F (35.9 C)-98.2 F (36.8 C)] 98.2 F (36.8 C)  Heart Rate:  [85-87] 85   Resp Rate:  [18] 18   BP: (106-115)/(55-57) 106/55 mmHg    Intake and Output Summary (Last 24 hours) at Date Time  No intake or output data in the 24 hours ending 08/07/13 1023    Gen: WN WD NAD  HEENT: NC/AT, moist MM, clear oropharynx  Neck: No JVD  CV: S1 S2 N RRR no M/R/GC  Chest: Decreased breath sounds  Ab: ND NT Soft no HSM +BS  Skin: Dry  Ext: No C/E  Psych: Appropriate mood and affect    Labs:     Lab 08/02/13 0731 08/01/13 0617   GLU 156* 194*   BUN 65* 52*   CREAT 7.2* 6.7*   CA 8.8 8.8   NA 135* 137  K 4.9 4.8   CL 97* 95*   CO2 25 25   ALB -- --   PHOS -- --   MG -- --       Lab 08/02/13 0731 08/01/13 0617   WBC 12.89* 13.60*   RBC 2.79* 3.09*   HGB 8.3* 9.2*   HCT 24.4* 27.7*   MCV 87.5 89.6   MCH 29.7 29.8   MCHC 34.0 33.2   RDW 14 14   MPV 10.7 11.1   PLT 325 348     Radiology:   Radiological Procedure Echocardiogram Adult Complete W Clr/ Dopp Waveform    08/01/2013  impression: 1. Atrial septal aneurysm is present. 2. There is a small to moderate pericardial effusion noted both anteriorly and posteriorly.  this is only slightly increased from the previous echocardiogram dated 07/20/2013. Variation of tricuspid flow velocity is noted. There is no diastolic collapse of the right ventricle noted. There is organized material note noted in the apex of the right ventricle not previously reported. Small small pleural effusion is present. Clinical correlation is required considering these echocardiographic changes. 3. There are no other significant echogram echocardiographic changes noted.  Lennon Alstrom, MD  08/01/2013 2:03 PM     Echocardiogram Adult Complete W Color Doppler Waveform    07/20/2013   Normal left ventricular systolic  function. No significant valvular heart disease. Small pericardial effusion without evidence of cardiac tamponade.    Audria Nine, MD  07/20/2013 4:09 PM     Ct Abdomen Pelvis Wo Iv/ Wo Po Cont    07/20/2013   1. Interval development of a minimal to moderate pericardial effusion and trace pleural effusions since prior exam one day earlier. 2. No detectable acute abnormalities in the abdomen or pelvis. 3. Contrast residue gallbladder, kidneys, and bladder. 4. Numerous small low-attenuation kidney lesions. 5. Remainder as above. 6. Findings discussed with Dr. Beryle Lathe by Dr. Oren Binet at 3:59 AM.  Sheria Lang, MD  07/20/2013 8:23 AM     Ct Angio Chest    07/19/2013   1. No CT scan evidence for detectable pulmonary embolism. 2. Minimal cardiomegaly. 3. Minimal bibasilar lung atelectasis. 4. Remainder as above.  Sheria Lang, MD  07/19/2013 3:18 PM     Xr Chest Ap Portable    08/07/2013   Stable exam with small right pleural effusion and moderate left pleural effusion.  Loel Lofty. Anna Genre, MD  08/07/2013 9:38 AM     Chest Ap Portable    07/30/2013    1. New bilateral pleural effusions with bibasilar airspace opacities. 2. Stable cardiomegaly.  Darin Engels, MD  07/30/2013 4:33 PM     Xr Chest Ap Portable    07/19/2013   Hypoinflation with minimal basilar atelectasis.  Sheria Lang, MD  07/19/2013 2:45 PM     EKG:     Prior Records:   I reviewed her  old records.

## 2013-08-07 NOTE — H&P (Signed)
Patient Type: I     ATTENDING PHYSICIAN: Porfirio Oar, MD     PRIMARY CARE PHYSICIAN:  Emelda Fear MD.     CHIEF COMPLAINT:  Chest pain with associated shortness of breath.     HISTORY OF PRESENT ILLNESS:  The patient is a 67 year old female well known to our service with past  medical history significant for recent hospitalizations secondary to viral  pericarditis, acute diastolic congestive heart failure exacerbation with  recent small-to-moderate pericardial effusion and bilateral pleural  effusions who has history of end-stage renal disease on hemodialysis,  hypertension, insulin-dependent diabetes, hyperlipidemia, history of a left  lower extremity DVT and diabetic neuropathy who was transferred from [place] for direct admission for further management of recurrent chest  pain with associated shortness of breath.  The patient states that  yesterday she was in normal state of health and underwent her dialysis with  no complications.  She states that after dialysis in the early evening, she  developed acute onset of right-sided chest discomfort which she described  as heavy pressure, worse with movements.  She stated during my exam chest  pain was currently 7/10; however, at its worst, was 10/10.  She reported  associated belching with no relief in symptoms.  She was evaluated in the  [facility] Emergency Room overnight and underwent additional workup  including CT scan of the chest.  The patient was transferred to [facility] for further management.  She denies any recent fevers or  chills.  She has noted persistent dry cough and has had associated  shortness of breath, worse with exertion.  She had followed up with  [facility] on Friday, August 05, 2013, and underwent  echocardiogram.  Results of echocardiogram are not accessible to me at this  time; however, [facility] group has been consulted.  In the  ED at [facility] ([facility]), she received  Zofran 4 mg, Dilaudid 1 mg, and  Levaquin 500 mg.  She states that she was compliant with her home  medications after discharge.  She was discharged from the hospital on  August 12, at which time she was ambulating and maintaining appropriate  oxygen saturations around 94% on room air.  She denies any changes in lower  extremity swelling.     PAST MEDICAL HISTORY:  1.  Recent viral pericarditis.  2.  Diastolic CHF.  3.  Small-to-moderate pericardial effusion.  4.  Bilateral pleural effusions.  5.  Anemia of chronic disease.  6.  End-stage renal disease on hemodialysis (Tuesday, Thursday, Saturday).   The patient with a left upper extremity AV fistula.  7.  Hypertension.  8.  Insulin-dependent diabetes.  9.  Hyperlipidemia.  10.  History of left lower extremity deep venous thrombosis.  11.  Diabetic neuropathy.     CURRENT MEDICATIONS:  1.  Amlodipine 5 mg p.o. daily.  2.  Atorvastatin 10 mg p.o. nightly.  3.  Nephro-Vite 1 tablet p.o. daily.  4.  Colchicine 0.6 mg p.o. daily.  5.  Gabapentin 300 mg p.o. t.i.d.  6.  Lantus 12 units subcutaneously at bedtime.  7.  Nexium 40 mg p.o. daily.  8.  Lyrica 75 mg p.o. every 12 hours.   9.  Coumadin 5 mg p.o. daily.  10.  Ultram 50 mg p.o. every 6 hours p.r.n. pain.     ALLERGIES:  The patient states she is allergic to PENICILLIN and SHRIMP.     SOCIAL HISTORY:  The patient currently lives with family.  She denies any history of tobacco  use.  No recent alcohol or illicit drug use.  She states that she has an  advanced directive and reports her power of attorney is [employer], [employer].   [name].     FAMILY HISTORY:  The patient was asked and denies any pertinent family history to presenting  illness.  She denies any history of significant heart disease or cancer.     REVIEW OF SYSTEMS:  Twelve systems were reviewed.  Pertinent positives per the HPI, which  include recent onset of severe right-sided chest pain described as  pressure-like, worse with movement; and at  its worse, 10/10, pain was  persistent.  She reported associated shortness of breath and generalized  weakness.  She stated that symptoms occurred acutely after dialysis  yesterday evening.  She denied any headaches, no vision changes.  No  difficulty in swallowing.  No recent palpitations.  She has had dry cough.   She reports associated nausea.  No abdominal pain, however, does state that  chest discomfort is right sided.  She has reported intermittent episodes of  increased belching.  No changes in bowels.  No diarrhea.  No black or  bloody stools.  No dysuria or hematuria.  Denies any changes in lower  extremity swelling.  Denies any unilateral weakness or paresthesias.  All  other systems were reviewed and were negative.     PHYSICAL EXAMINATION:  VITAL SIGNS:  Blood pressure 106/55, heart rate 85, respiratory rate 18,  oxygen saturations 98% on 4 liters nasal cannula.  GENERAL:  The patient in no acute distress.  The patient is somnolent,  however, answering questions appropriately and cooperative with exam.  HEENT:  Head:  Normocephalic, atraumatic.  Oropharynx is clear.  Moist  mucous membranes.    NECK:  Supple, full range of motion, no evidence of JVD.    EYES:  PERRLA, clear conjunctivae.  Extraocular muscles are intact.  CARDIOVASCULAR:  Regular rate and rhythm.  No murmurs.  No rubs.  LUNGS:  Bibasilar decreased breath sounds.  No wheezes or rhonchi.  ABDOMEN:  Soft, nontender, nondistended.  Hyperactive bowel sounds, no  guarding or rebound.  MUSCULOSKELETAL:  Trace to 1+ pitting bilateral lower extremity edema.  NEUROLOGIC:  Cranial nerves II-XII are grossly intact.  She moves all  extremities, full range of motion.  Muscle 5/5 muscle strength in bilateral  upper and lower extremities.  No gross focal neurologic deficit  appreciated.  PSYCHIATRIC:  The patient is awake, oriented x3 with appropriate affect.  SKIN:  No visible rashes or lesions.  Skin is warm, dry, and intact.     LABORATORY AND  DIAGNOSTIC DATA:  WBC 6.64, hemoglobin 7.9, hematocrit 24.1, platelets 257, troponin less  than 0.01, CK 34, INR 1.3.  Chest x-ray with stable exam with small right  pleural effusion and moderate left pleural effusion.     Records obtained from [facility]:  WBC 10.4,  hemoglobin 8.7, hematocrit 27.0,  MCV 89.7, platelets 283.  INR 1.3, sodium  132, potassium 3.6, chloride 96, bicarbonate 27, BUN 29, creatinine 4.42,  calcium 9.7, total bilirubin 0.2, AST 24, ALT 19, alkaline phosphatase 105.   CK 57.  CK-MB 1.5.  Myoglobin 98, troponin 0.01.  Lipase 26, albumin 3.4.   CT of the thorax without contrast performed on August 17 at [facility]  with report stating no hilar, mediastinal, or axillary lymphadenopathy.   There  are bilateral pleural effusions, small to moderate on the right and  moderate on the left.  There is an 8-mm pericardial effusion reported.   There is mild cardiomegaly, no aortic aneurysm evident.  There is bilateral  lower lobe atelectasis and reported tracheobronchial tree is partially  collapsed.  The incidentally visualized upper abdomen is unremarkable.   There is no significant soft tissue or osseous abnormalities evident.  EKG  with normal sinus rhythm, heart rate 98, nonspecific T-wave abnormality.     ASSESSMENT AND PLAN:  The patient is a 67 year old female with past medical history significant  for recent hospitalizations in the setting of viral pericarditis with  moderate pericardial effusion and bilateral pleural effusions who was  previously diagnosed with left lower extremity deep venous thrombosis,  history of end-stage renal disease on hemodialysis, hypertension,  hyperlipidemia, and insulin-dependent diabetes who was transferred from  [facility] this morning secondary to recurrent chest pain with  associated shortness of breath.    1.  Chest pain, atypical.  The patient stating pain as right-sided and positional.    The patient has recently  been on our service in the past  several weeks and has been followed by [facility] who has  been consulted.  I have discussed the case with Dr. Posey Pronto who will evaluate  the patient.  The patient has been on Coumadin; however, INR is currently  1.3.  She underwent prior CT angiogram of the thorax on July 19, 2013, that  showed no evidence of pulmonary embolus.  The patient's imaging from [facility] has been reviewed that shows evidence of bilateral pleural  effusions as well as persistent pericardial effusion.  Case has been  discussed with Dr. Leonette Most of nephrology and plans for hemodialysis this  morning.  We will continue previous dose of colchicine for recent  pericarditis.  The patient will be started on heparin drip given her  subtherapeutic INR and possible need for thoracentesis and/or  pericardiocentesis in the near future.  Pulmonary will be consulted given   abnormal findings on CT report from [facility].  Encourage IS use.  2.  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  Cardiology as well as nephrology have been consulted.  I have discussed the  case with Dr. Leonette Most, and plans for hemodialysis this morning.  The  patient with recent echocardiogram performed on August 05, 2013.  Report is  pending at this time.  3.  Pericardial effusion. The patient's blood pressure is currently  stable.  She does not have evidence of tamponade.  Echocardiogram from  August 15, results are pending.  The case has been discussed with Dr. Posey Pronto  or [facility].  Repeat echocardiogram, possibly in the  morning, per cardiology.  4.  Recent viral pericarditis.  Continue colchicine.  5.  Anemia of chronic disease.  The patient is with evidence of volume  overload, potential dilutional.  Will follow hemoglobin closely.  The  patient denies any episodes of black or bloody stools.  Continue Aranesp  per nephrology.  6.  End-stage renal disease on hemodialysis.  Dr. Leonette Most is  actively  following and plans for hemodialysis today.  7.  Hypertension.  Blood pressure currently well controlled. Continue home  medications.  8.  Insulin-dependent diabetes.  Continue Lantus.  We will continue to  monitor Accu-Cheks q.a.c. and nightly and administer sliding scale as  needed.  9.  Recent left lower extremity deep venous thrombosis.  The patient was  previously on Coumadin; however, INR subtherapeutic.  Given the potential  for procedures, the patient has been started on heparin drip.  10.  Gastrointestinal prophylaxis.  Pepcid.  11.  Deep venous thrombosis prophylaxis.  The patient will be on a heparin  drip.           D:  08/07/2013 13:04 PM by Dr. Moshe Cipro, MD ([id number])  T:  08/07/2013 16:46 PM by [id number]      ([id number]) (Doc [id number])

## 2013-08-07 NOTE — Plan of Care (Signed)
Problem: Pain  Goal: Patient's pain/discomfort is manageable  Outcome: Not Progressing  Patient complaining of RUQ pain.  PRN medication given with relief.  Patient appears very sleepy but easily aroused.      Problem: Moderate/High Fall Risk Score >/=15  Goal: Patient will remain free of falls  Outcome: Progressing  Patient alert and oriented.  Patient remains free from falls/injury.  Bed alarm on.  Family at the bedside.  Will continue to monitor.    Problem: Renal Failure  Goal: Free from infection- Renal  Outcome: Progressing  Hemodialysis in progress.  No complications at this time.    Problem: Potential for Compromised Hemodynamic Status  Goal: Stable vital signs and fluid balance  Outcome: Progressing  Patient with low BP.  Monitored closely by dialysis nurse.  Patient asymptomatic at this time.

## 2013-08-08 LAB — ECG 12-LEAD
Atrial Rate: 80 {beats}/min
P Axis: 39 degrees
P-R Interval: 160 ms
Q-T Interval: 386 ms
QRS Duration: 100 ms
QTC Calculation (Bezet): 445 ms
R Axis: -27 degrees
T Axis: 52 degrees
Ventricular Rate: 80 {beats}/min

## 2013-08-08 LAB — GFR: EGFR: 13.1

## 2013-08-08 LAB — CK: Creatine Kinase (CK): 27 U/L — ABNORMAL LOW (ref 29–168)

## 2013-08-08 LAB — CBC AND DIFFERENTIAL
Basophils Absolute Automated: 0.03 (ref 0.00–0.20)
Basophils Automated: 0 %
Eosinophils Absolute Automated: 0.26 (ref 0.00–0.70)
Eosinophils Automated: 4 %
Hematocrit: 25 % — ABNORMAL LOW (ref 37.0–47.0)
Hgb: 7.9 g/dL — ABNORMAL LOW (ref 12.0–16.0)
Immature Granulocytes Absolute: 0.01
Immature Granulocytes: 0 %
Lymphocytes Absolute Automated: 2.21 (ref 0.50–4.40)
Lymphocytes Automated: 31 %
MCH: 28.5 pg (ref 28.0–32.0)
MCHC: 31.6 g/dL — ABNORMAL LOW (ref 32.0–36.0)
MCV: 90.3 fL (ref 80.0–100.0)
MPV: 10.7 fL (ref 9.4–12.3)
Monocytes Absolute Automated: 0.5 (ref 0.00–1.20)
Monocytes: 7 %
Neutrophils Absolute: 4.21 (ref 1.80–8.10)
Neutrophils: 58 %
Nucleated RBC: 0 (ref 0–1)
Platelets: 247 (ref 140–400)
RBC: 2.77 — ABNORMAL LOW (ref 4.20–5.40)
RDW: 14 % (ref 12–15)
WBC: 7.21 (ref 3.50–10.80)

## 2013-08-08 LAB — RENAL FUNCTION PANEL
Albumin: 2.3 g/dL — ABNORMAL LOW (ref 3.5–5.0)
Anion Gap: 9 (ref 5.0–15.0)
BUN: 22 mg/dL — ABNORMAL HIGH (ref 7–19)
CO2: 29 (ref 22–29)
Calcium: 8.7 mg/dL (ref 8.5–10.5)
Chloride: 100 (ref 98–107)
Creatinine: 4.1 mg/dL — ABNORMAL HIGH (ref 0.6–1.0)
Glucose: 168 mg/dL — ABNORMAL HIGH (ref 70–100)
Phosphorus: 5.5 mg/dL — ABNORMAL HIGH (ref 2.3–4.7)
Potassium: 4.2 (ref 3.5–5.1)
Sodium: 138 (ref 136–145)

## 2013-08-08 LAB — APTT
PTT: 58 — ABNORMAL HIGH (ref 23–37)
PTT: 56 — ABNORMAL HIGH (ref 23–37)
PTT: 35 (ref 23–37)

## 2013-08-08 LAB — POCT GLUCOSE: Whole Blood Glucose POCT: 208 mg/dL — AB (ref 70–100)

## 2013-08-08 LAB — TROPONIN I: Troponin I: <0.01 ng/mL (ref 0.00–0.09)

## 2013-08-08 MED ORDER — INSULIN REGULAR HUMAN 100 UNIT/ML IJ SOLN
1.0000 [IU] | Freq: Three times a day (TID) | INTRAMUSCULAR | Status: DC | PRN
Start: 2013-08-08 — End: 2013-08-19
  Administered 2013-08-08: 3 [IU] via SUBCUTANEOUS
  Administered 2013-08-08: 1 [IU] via SUBCUTANEOUS
  Administered 2013-08-09 (×2): 3 [IU] via SUBCUTANEOUS
  Administered 2013-08-09: 1 [IU] via SUBCUTANEOUS
  Administered 2013-08-10 (×2): 5 [IU] via SUBCUTANEOUS
  Administered 2013-08-10: 3 [IU] via SUBCUTANEOUS
  Administered 2013-08-11: 5 [IU] via SUBCUTANEOUS
  Administered 2013-08-12: 3 [IU] via SUBCUTANEOUS
  Administered 2013-08-12: 1 [IU] via SUBCUTANEOUS
  Administered 2013-08-12: 5 [IU] via SUBCUTANEOUS
  Administered 2013-08-13 – 2013-08-14 (×2): 1 [IU] via SUBCUTANEOUS
  Administered 2013-08-14 – 2013-08-15 (×3): 3 [IU] via SUBCUTANEOUS
  Administered 2013-08-16: 1 [IU] via SUBCUTANEOUS
  Administered 2013-08-16: 5 [IU] via SUBCUTANEOUS
  Administered 2013-08-16 – 2013-08-17 (×3): 1 [IU] via SUBCUTANEOUS
  Administered 2013-08-17: 3 [IU] via SUBCUTANEOUS
  Administered 2013-08-17: 5 [IU] via SUBCUTANEOUS
  Administered 2013-08-18 – 2013-08-19 (×2): 1 [IU] via SUBCUTANEOUS
  Filled 2013-08-08: qty 30
  Filled 2013-08-08 (×3): qty 10
  Filled 2013-08-08: qty 50
  Filled 2013-08-08 (×2): qty 10
  Filled 2013-08-08: qty 50
  Filled 2013-08-08: qty 10
  Filled 2013-08-08: qty 30

## 2013-08-08 MED ORDER — HEPARIN SODIUM (PORCINE) 5000 UNIT/ML IJ SOLN
3000.0000 [IU] | INTRAMUSCULAR | Status: DC | PRN
Start: 2013-08-08 — End: 2013-08-19
  Administered 2013-08-08 – 2013-08-15 (×3): 3000 [IU] via INTRAVENOUS
  Filled 2013-08-08 (×3): qty 1

## 2013-08-08 MED ORDER — DEXTROSE 50 % IV SOLN
25.0000 mL | INTRAVENOUS | Status: DC | PRN
Start: 2013-08-08 — End: 2013-08-19

## 2013-08-08 MED ORDER — GLUCOSE 40 % PO GEL
15.0000 g | ORAL | Status: DC | PRN
Start: 2013-08-08 — End: 2013-08-19

## 2013-08-08 MED ORDER — GLUCAGON HCL (RDNA) 1 MG IJ SOLR
1.0000 mg | INTRAMUSCULAR | Status: DC | PRN
Start: 2013-08-08 — End: 2013-08-19

## 2013-08-08 MED ORDER — HEPARIN (PORCINE) IN D5W 50-5 UNIT/ML-% IV SOLN
1000.0000 [IU]/h | INTRAVENOUS | Status: DC
Start: 2013-08-08 — End: 2013-08-19
  Administered 2013-08-09 – 2013-08-10 (×2): 750 [IU]/h via INTRAVENOUS
  Administered 2013-08-11: 1000 [IU]/h via INTRAVENOUS
  Administered 2013-08-12: 900 [IU]/h via INTRAVENOUS
  Administered 2013-08-13 – 2013-08-14 (×2): 1000 [IU]/h via INTRAVENOUS
  Administered 2013-08-15: 950 [IU]/h via INTRAVENOUS
  Administered 2013-08-16: 750 [IU]/h via INTRAVENOUS
  Administered 2013-08-17: 1000 [IU]/h via INTRAVENOUS
  Administered 2013-08-18: 600 [IU]/h via INTRAVENOUS
  Filled 2013-08-08 (×8): qty 500

## 2013-08-08 NOTE — PT Eval Note (Signed)
[facility]  Physical Therapy Treatment    Patient:  Mary [name] MRN#:  [id number]  Unit:  T4C TELEMETRY Room/Bed:  [id number]    Assessment:   After today's treatment patient able to ambulate ind c cane s SOB x 120 feet.  Assessment: Decreased endurance/activity tolerance, resulting in continued difficulty with ADLs.  Prognosis: Good    Interdisciplinary Communication:   Patient up in chairand call bell within reach. Communicated with RN re gait ability    Education:   Further education provided to patient/caregiver on  safety with mobility and ADLs,energy conservation.  Demonstrated good understanding .  Will benefit from further training to focus on endurance and cadence.      Plan:   Goals  Goal Formulation: With patient  Time for Goal Acheivement: 3 visits  Goals: Select goal  Pt Will Ambulate: 151-200 feet;with single point cane (c increased cadence)  Plan  Risks/Benefits/POC Discussed with Pt/Family: With patient  Treatment/Interventions: Gait training  PT Frequency: 3-4x/wk    Continue plan of care.    Recommendation  Discharge Recommendation: Home with no needs  DME Recommended for Discharge:  (none)  PT - Next Visit Recommendation: 08/10/13  PT Frequency: 3-4x/wk    Treatment:     Time of treatment: Start Time: [id number] Stop Time: 1610  Time Calculation (min): 25 min  PT Received On: 08/08/13    Treatment #      Precautions  Weight Bearing Status: no restrictions    Patient's medical condition is appropriate for Physical Therapy intervention at this time.     Subjective:   Patient is agreeable to participation in the therapy session.Daughter present      Pain Assessment  Pain Assessment: Numeric Scale (0-10)  Pain Score: 5-moderate pain  Pain Location:  (R sided chest pain)          Objective:  Patient is in bed with telemetry in place.    Cognition  Arousal/Alertness: Appropriate responses to stimuli  Following Commands: Follows all commands and directions without difficulty  Functional  Mobility  Supine to Sit: Independent  Sit to Stand: Independent  Stand to Sit: Independent     Locomotion  Ambulation: independently;with single point cane  Ambulation Distance (Feet): 120 Feet  Pattern: decreased cadence (pt states walking speed not baseline)  Stair Management: independently;one rail R;with cane  Number of Stairs: [place], PT

## 2013-08-08 NOTE — Progress Notes (Signed)
Mary [name] is a 67 y.o. female patient with PMH notable for  DM, HTN, ESRD on hemodialysis, admitted because of SOB.    Active Problems:   Chest pain    Past Medical History   Diagnosis Date   . Diabetes mellitus without complication    . Hyperlipidemia    . Hypertensive disorder    . Chronic kidney disease      dialysis t-tr-sat   . Arthritis    . Cataracts, bilateral    . GERD (gastroesophageal reflux disease)    . Acute deep vein thrombosis (DVT) of distal vein of left lower extremity    . Hemodialysis access site with mature fistula      Current Facility-Administered Medications   Medication Dose Route Frequency Provider Last Rate Last Dose   . acetaminophen (TYLENOL) tablet 650 mg  650 mg Oral Q6H PRN Laroy Apple, MD       . amLODIPine (NORVASC) tablet 5 mg  5 mg Oral Daily Laroy Apple, MD       . aspirin chewable tablet 81 mg  81 mg Oral Daily Laroy Apple, MD   81 mg at 08/08/13 0912   . atorvastatin (LIPITOR) tablet 10 mg  10 mg Oral Daily Laroy Apple, MD   10 mg at 08/08/13 0913   . b complex-vitamin c-folic acid (NEPHRO-VITE) tablet 1 tablet  1 tablet Oral Daily Laroy Apple, MD   1 tablet at 08/08/13 [id number]   . bisacodyl (DULCOLAX) EC tablet 5 mg  5 mg Oral Daily Noralyn Pick, MD   5 mg at 08/08/13 [id number]   . colchicine tablet 0.6 mg  0.6 mg Oral Daily Laroy Apple, MD   0.6 mg at 08/08/13 0913   . dextrose (GLUCOSE) 40 % oral gel 15 g  15 g Oral PRN Laroy Apple, MD       . dextrose 50 % bolus 25 mL  25 mL Intravenous PRN Laroy Apple, MD       . gabapentin (NEURONTIN) capsule 300 mg  300 mg Oral TID Laroy Apple, MD   300 mg at 08/08/13 1708   . glucagon (rDNA) (GLUCAGEN) injection 1 mg  1 mg Intramuscular PRN Laroy Apple, MD       . heparin 25000 units in dextrose 5% 500 mL infusion (premix)  1,000 Units/hr Intravenous Continuous Laroy Apple, MD 14 mL/hr at 08/07/13 2210 700  Units/hr at 08/07/13 2210   . insulin glargine (LANTUS) injection 12 Units  12 Units Subcutaneous QHS Laroy Apple, MD   12 Units at 08/07/13 2222   . insulin regular (HumuLIN R,NovoLIN R) injection 1-8 Units  1-8 Units Subcutaneous TID AC PRN Laroy Apple, MD   3 Units at 08/08/13 1708   . lidocaine (LMX PLUS 4%) 4 % dressing   Topical PRN Alric Quan, MD   1 each at 08/07/13 1050   . pantoprazole (PROTONIX) EC tablet 40 mg  40 mg Oral QAM AC Laroy Apple, MD   40 mg at 08/08/13 0913   . pregabalin (LYRICA) capsule 75 mg  75 mg Oral Q12H [place] Laroy Apple, MD   75 mg at 08/08/13 0913   . traMADol (ULTRAM) tablet 50 mg  50 mg Oral Q6H PRN Laroy Apple, MD   50 mg at 08/08/13 1141     Allergies   Allergen Reactions   . Penicillins Hives   .  Shrimp (Shellfish Allergy) Swelling     Blood pressure 108/50, pulse 84, temperature 97 F (36.1 C), temperature source Oral, resp. rate 20, height 1.626 m (5\' 4" ), weight 93.441 kg (206 lb), SpO2 91.00%.    Subjective:  Symptoms:  She reports shortness of breath.    Diet:  Adequate intake.    Activity level: Impaired due to weakness.      Objective:  General Appearance:  In no acute distress.    Vital signs: (most recent): Blood pressure 108/50, pulse 84, temperature 97 F (36.1 C), temperature source Oral, resp. rate 20, height 1.626 m (5\' 4" ), weight 93.441 kg (206 lb), SpO2 91.00%.    Output: No urine output and producing stool.    Lungs:  There are decreased breath sounds.    Heart: Normal rate.    Neurological: Patient is alert.    Skin:  Warm.    Abdomen: Abdomen is soft.      Results     Procedure Component Value Units Date/Time    POCT glucose (AC and HS) [id number]  (Abnormal) Collected:08/08/13 1700     POCT Glucose WB 208 (A) mg/dL Updated:08/08/13 1713    Renal function panel (pre dialysis - first dialysis of the week) [id number]  (Abnormal) Collected:08/08/13 0645    Specimen Information:Blood Updated:08/08/13  0737     Glucose 168 (H) mg/dL      Sodium 138      Potassium 4.2      Chloride 100      CO2 29      BUN 22 (H) mg/dL      CALCIUM 8.7 mg/dL      Creatinine 4.1 (H) mg/dL      Albumin 2.3 (L) g/dL      Phosphorus 5.5 (H) mg/dL      Anion Gap 9.0     Narrative:    Per low intensity heparin protocol.  For 6 Hours    GFR [id number] Collected:08/08/13 0645     EGFR 13.1 Updated:08/08/13 0737    Narrative:    Per low intensity heparin protocol.  For 6 Hours    APTT [id number]  (Abnormal) Collected:08/08/13 0646     PTT 56 (H) Updated:08/08/13 0715     APTT Anticoag. Given w/i 48 hrs. heparin     CBC and differential (pre dialysis - first dialysis of the week) [id number]  (Abnormal) Collected:08/08/13 0645    Specimen Information:Blood / Blood Updated:08/08/13 0708     WBC 7.21      RBC 2.77 (L)      Hgb 7.9 (L) g/dL      Hematocrit 25.0 (L) %      MCV 90.3 fL      MCH 28.5 pg      MCHC 31.6 (L) g/dL      RDW 14 %      Platelets 247      MPV 10.7 fL      Nucleated RBC 0      Neutrophils 58 %      Lymphocytes Automated 31 %      Monocytes 7 %      Eosinophils Automated 4 %      Basophils Automated 0 %      Immature Granulocyte 0 %      Neutrophils Absolute 4.21      Abs Lymph Automated 2.21      Abs Mono Automated 0.50      Abs Eos Automated 0.26  Absolute Baso Automated 0.03      Absolute Immature Granulocyte 0.01     Narrative:    Per low intensity heparin protocol.  For 6 Hours    APTT [id number]  (Abnormal) Collected:08/08/13 0515     PTT 58 (H) Updated:08/08/13 0625     APTT Anticoag. Given w/i 48 hrs. heparin     Troponin I  [id number] Collected:08/08/13 0127    Specimen Information:Blood Updated:08/08/13 0210     Troponin I <0.01 ng/mL     Narrative:    6 hours after first Troponin I until peak    Creatine Kinase (CK) [id number]  (Abnormal) Collected:08/08/13 0127    Specimen Information:Blood Updated:08/08/13 0209     Creatine Kinase (CK) 27 (L) U/L     Narrative:    6 hours after first Troponin I until  peak    APTT [id number]  (Abnormal) Collected:08/07/13 1928     PTT 114 (H) Updated:08/07/13 2054     APTT Anticoag. Given w/i 48 hrs. heparin     Narrative:    Per low intensity heparin protocol.  For 6 Hours    Creatine Kinase (CK) [id number] Collected:08/07/13 1928    Specimen Information:Blood Updated:08/07/13 2004     Creatine Kinase (CK) 31 U/L     Narrative:    6 hours after first Troponin I until peak    Troponin I  [id number] Collected:08/07/13 1928    Specimen Information:Blood Updated:08/07/13 2004     Troponin I <0.01 ng/mL     Narrative:    6 hours after first Troponin I until peak      Echocardiogram Adult Complete W Clr/ Dopp Waveform    08/01/2013  impression: 1. Atrial septal aneurysm is present. 2. There is a small to moderate pericardial effusion noted both anteriorly and posteriorly.  this is only slightly increased from the previous echocardiogram dated 07/20/2013. Variation of tricuspid flow velocity is noted. There is no diastolic collapse of the right ventricle noted. There is organized material note noted in the apex of the right ventricle not previously reported. Small small pleural effusion is present. Clinical correlation is required considering these echocardiographic changes. 3. There are no other significant echogram echocardiographic changes noted.  Lennon Alstrom, MD  08/01/2013 2:03 PM     Echocardiogram Adult Complete W Color Doppler Waveform    07/20/2013   Normal left ventricular systolic function. No significant valvular heart disease. Small pericardial effusion without evidence of cardiac tamponade.    Audria Nine, MD  07/20/2013 4:09 PM     Ct Abdomen Pelvis Wo Iv/ Wo Po Cont    07/20/2013   1. Interval development of a minimal to moderate pericardial effusion and trace pleural effusions since prior exam one day earlier. 2. No detectable acute abnormalities in the abdomen or pelvis. 3. Contrast residue gallbladder, kidneys, and bladder. 4. Numerous small low-attenuation kidney  lesions. 5. Remainder as above. 6. Findings discussed with Dr. Beryle Lathe by Dr. Oren Binet at 3:59 AM.  Sheria Lang, MD  07/20/2013 8:23 AM     Ct Angio Chest    07/19/2013   1. No CT scan evidence for detectable pulmonary embolism. 2. Minimal cardiomegaly. 3. Minimal bibasilar lung atelectasis. 4. Remainder as above.  Sheria Lang, MD  07/19/2013 3:18 PM     Xr Chest Ap Portable    08/07/2013   Stable exam with small right pleural effusion and moderate left pleural effusion.  Loel Lofty. Anna Genre, MD  08/07/2013 9:38 AM  Chest Ap Portable    07/30/2013    1. New bilateral pleural effusions with bibasilar airspace opacities. 2. Stable cardiomegaly.  Darin Engels, MD  07/30/2013 4:33 PM     Xr Chest Ap Portable    07/19/2013   Hypoinflation with minimal basilar atelectasis.  Sheria Lang, MD  07/19/2013 2:45 PM     Assessment:  (1.chest pain  2.pleural effusion  3.viral pericarditis  4.ESRD  5.DM  6.MGUS  7.HTN).       Plan:   (1.Hemodialysis with UF yesterday  2.scheduled for hemodialysis in AM  3.monitor serum electrolytes).       Alric Quan  08/08/2013

## 2013-08-08 NOTE — Plan of Care (Signed)
Problem: Safety  Goal: Patient will be free from injury during hospitalization  Outcome: Progressing  Pt's safety has been maintained.  Bed in lowest position.  3/4 side rails up.  Call bell within reach.  Bed alarm on.  Pt knows to call for assistance.  Fall level marked on white board and reviewed with pt.  Family at bedside.

## 2013-08-08 NOTE — Progress Notes (Signed)
[facility] Case Management Initial Discharge Planning Assessment:    This CM/SW met with patient, introduced self and Case Management Services.     Psychosocial/Demographic Information:Confirmed    1) Name of person Mary [name], relationship with patient and best contact information:Patient and two daughters by bedside    2) Pt lives with - daughter Mary [name] [phone]    3) Type of Residence, # of steps: House    4) Prior level of functioning - independent; Uses cane; Drives herself to and from dialysis    5) Insurance information, Medication Coverage on facesheet is verified:Medicare A&B    6) Community Connections:Family    7) Any additional emergency contacts:Same as above    8) Advanced Directives on the chart? No    9) Healthcare Decision Maker and relationship to patient, best contact information:Self    10) Palliative Care or Hospice involvement?None      DME, SNF, HH needs:  1) Current DME's at home or used in the past:Cane, walker    2) SNF or Acute Rehab placement prior to admission:No    3) [place] currently being used or used in the past:None    4) Referrals needed:Y or N:  No   A) SNF/Acute Rehab/LTACH    B) PACE   C) TCM   D) [place]) [facility] [facility] or [place]) [place] (referred to [facility] Liaison?)   G) DME   I) Others    Discharge Needs:   A) PCP, follow-up appointment made?Dr. Sylvie Farrier -- will be made prior to discharge   B) Transportation Needs:Patient drives herself to and from dialysis   C) Others    Plan of Care and possible d/c date explained to patient/family member:Pulmonary consult pending.  dialysis    LACE Index score: ___9____    1st IMM given and signed? Given prior to coming to unit    Observation Letter given and signed if needed?Not applicable    Any discharge barriers:None    Additional Note:     Known dialysis patient.  No discharge needs identified.  CM will schedule follow-up appointment prior to discharge.

## 2013-08-08 NOTE — Progress Notes (Signed)
Date Time: 08/08/2013 12:15 PM  Patient Name: Mary [name],Mary [name]    Subjective:   Denies any new complaints.  Continues with intermittent right-sided CP and DOE.  No HAs.  No abdominal pain or nausea.      Objective:   VITALS:  Filed Vitals:    08/08/13 0053 08/08/13 0423 08/08/13 0813 08/08/13 1239   BP: 114/58 116/65 98/48 108/50   Pulse: 89 85 80 84   Temp: 97.6 F (36.4 C) 97.4 F (36.3 C) 97.7 F (36.5 C) 97 F (36.1 C)   TempSrc: Oral Oral Oral    Resp: 16 18 18 20    Height:       Weight:       SpO2: 95% 94% 95% 91%     Intake and Output Summary (Last 24 hours) at Date Time    Intake/Output Summary (Last 24 hours) at 08/08/13 0847  Last data filed at 08/07/13 1700   Gross per 24 hour   Intake    220 ml   Output   2050 ml   Net  -1830 ml     PHYSICAL EXAM:  GEN APPEARANCE: No acute distress  HEENT: PERLA; EOMI; Conjunctiva Clear  NECK: Supple; FROM; no JVD  CVS: RRR, S1, S2; No M/[name]/R  LUNGS: decreased basilar bs; no rales or rhonchi  ABD: Soft; NT/ND; Normoactive BS  EXT: trace BLE edema  SKIN: No rash or lesions  NEURO: CN 2-12 intact; No gross focal neurological deficits  PSYCH: Alert and oriented x3. Appropriate affect.     Labs and Diagnostic Data:     Lab 08/08/13 0645 08/07/13 1016 08/02/13 0731   WBC 7.21 6.64 12.89*   HGB 7.9* 7.9* 8.3*   HCT 25.0* 24.1* 24.4*   MCV 90.3 90.6 87.5   PLT 247 257 325       Lab 08/08/13 0645 08/02/13 0731   NA 138 135*   K 4.2 4.9   CL 100 97*   CO2 29 25   BUN 22* 65*   CREAT 4.1* 7.2*   GLU 168* 156*   CA 8.7 8.8       Results     Procedure Component Value Units Date/Time    Renal function panel (pre dialysis - first dialysis of the week) [id number]  (Abnormal) Collected:08/08/13 0645    Specimen Information:Blood Updated:08/08/13 0737     Glucose 168 (H) mg/dL      Sodium 138      Potassium 4.2      Chloride 100      CO2 29      BUN 22 (H) mg/dL      CALCIUM 8.7 mg/dL      Creatinine 4.1 (H) mg/dL      Albumin 2.3 (L) [name]/dL      Phosphorus 5.5 (H) mg/dL      Anion Gap  9.0     Narrative:    Per low intensity heparin protocol.  For 6 Hours    GFR [id number] Collected:08/08/13 0645     EGFR 13.1 Updated:08/08/13 0737    Narrative:    Per low intensity heparin protocol.  For 6 Hours    APTT [id number]  (Abnormal) Collected:08/08/13 0646     PTT 56 (H) Updated:08/08/13 0715     APTT Anticoag. Given w/i 48 hrs. heparin     CBC and differential (pre dialysis - first dialysis of the week) [id number]  (Abnormal) Collected:08/08/13 0645    Specimen Information:Blood / Blood Updated:08/08/13 [id number]  WBC 7.21      RBC 2.77 (L)      Hgb 7.9 (L) [name]/dL      Hematocrit 25.0 (L) %      MCV 90.3 fL      MCH 28.5 pg      MCHC 31.6 (L) [name]/dL      RDW 14 %      Platelets 247      MPV 10.7 fL      Nucleated RBC 0      Neutrophils 58 %      Lymphocytes Automated 31 %      Monocytes 7 %      Eosinophils Automated 4 %      Basophils Automated 0 %      Immature Granulocyte 0 %      Neutrophils Absolute 4.21      Abs Lymph Automated 2.21      Abs Mono Automated 0.50      Abs Eos Automated 0.26      Absolute Baso Automated 0.03      Absolute Immature Granulocyte 0.01     Narrative:    Per low intensity heparin protocol.  For 6 Hours    APTT [id number]  (Abnormal) Collected:08/08/13 0515     PTT 58 (H) Updated:08/08/13 0625     APTT Anticoag. Given w/i 48 hrs. heparin     Troponin I  [id number] Collected:08/08/13 0127    Specimen Information:Blood Updated:08/08/13 0210     Troponin I <0.01 ng/mL     Narrative:    6 hours after first Troponin I until peak    Creatine Kinase (CK) [id number]  (Abnormal) Collected:08/08/13 0127    Specimen Information:Blood Updated:08/08/13 0209     Creatine Kinase (CK) 27 (L) U/L     Narrative:    6 hours after first Troponin I until peak    APTT [id number]  (Abnormal) Collected:08/07/13 1928     PTT 114 (H) Updated:08/07/13 2054     APTT Anticoag. Given w/i 48 hrs. heparin     Narrative:    Per low intensity heparin protocol.  For 6 Hours    Creatine Kinase (CK)  [id number] Collected:08/07/13 1928    Specimen Information:Blood Updated:08/07/13 2004     Creatine Kinase (CK) 31 U/L     Narrative:    6 hours after first Troponin I until peak    Troponin I  [id number] Collected:08/07/13 1928    Specimen Information:Blood Updated:08/07/13 2004     Troponin I <0.01 ng/mL     Narrative:    6 hours after first Troponin I until peak    Hepatitis B (HBV) Surface Antibody (pre dialysis - first chronic dialysis) [id number] Collected:08/07/13 1117    Specimen Information:Blood Updated:08/07/13 1721     HEPATITIS B SURFACE ANTIBODY <8.00     Hepatitis B (HBV) Surface Antigen (pre dialysis - first chronic dialysis) [id number] Collected:08/07/13 1117    Specimen Information:Blood Updated:08/07/13 1720     Hepatitis B Surface AG Non-Reactive     Hemolysis index [id number]  (Abnormal) Collected:08/07/13 1117     Hemolysis Index 10 (H) Updated:08/07/13 1651    APTT [id number] Collected:08/07/13 1015     PTT 31 Updated:08/07/13 1244    Narrative:    If not ordered in ED  6 hours after first Troponin I until peak    Troponin I  [id number] Collected:08/07/13 1016    Specimen Information:Blood Updated:08/07/13 1238     Troponin I <0.01 ng/mL  Narrative:    If not ordered in ED  6 hours after first Troponin I until peak    Creatine Kinase (CK) [id number] Collected:08/07/13 1016    Specimen Information:Blood Updated:08/07/13 1233     Creatine Kinase (CK) 34 U/L     Narrative:    If not ordered in ED  6 hours after first Troponin I until peak    Prothrombin time/INR [id number]  (Abnormal) Collected:08/07/13 1015    Specimen Information:Blood Updated:08/07/13 1226     PT 15.8 (H)      PT INR 1.3 (H)      PT Anticoag. Given Within 48 hrs. warfarin ([place]     Narrative:    If not ordered in ED  6 hours after first Troponin I until peak    CBC [id number]  (Abnormal) Collected:08/07/13 1016    Specimen Information:Blood / Blood Updated:08/07/13 1218     WBC 6.64      RBC 2.66 (L)      Hgb 7.9  (L) [name]/dL      Hematocrit 24.1 (L) %      MCV 90.6 fL      MCH 29.7 pg      MCHC 32.8 [name]/dL      RDW 14 %      Platelets 257      MPV 11.0 fL      Nucleated RBC 0     Narrative:    If not ordered in ED  6 hours after first Troponin I until peak        Radiological Studies:   Imaging reviewed.  CXR (8/17): Stable exam with small right pleural effusion and moderate left pleural effusion.    Current Medications:     Current Facility-Administered Medications   Medication Dose Route Frequency   . amLODIPine  5 mg Oral Daily   . aspirin  81 mg Oral Daily   . atorvastatin  10 mg Oral Daily   . b complex-vitamin c-folic acid  1 tablet Oral Daily   . bisacodyl  5 mg Oral Daily   . colchicine  0.6 mg Oral Daily   . gabapentin  300 mg Oral TID   . [COMPLETED] heparin (porcine)  3,000 Units Intravenous Once   . insulin glargine  12 Units Subcutaneous QHS   . pantoprazole  40 mg Oral QAM AC   . pregabalin  75 mg Oral Q12H [place]     Assessment and Plan:   Mary [name] is a 67 y.o. female with PMH significant for recent hospitalizations in the setting of viral pericarditis with moderate pericardial effusion and bilateral pleural effusions who was previously diagnosed with left lower extremity deep venous thrombosis, history of end-stage renal disease on hemodialysis, hypertension, hyperlipidemia, and insulin-dependent diabetes who was transferred from [facility] this morning secondary to recurrent chest pain with associated shortness of breath.     1. Chest pain, atypical. Appreciate Cardiology and Pulmonary asst.  CXR with bilateral pleural effusions.  Plan for HD for volume removal.  Case discussed with Dr. Elder Negus who states 2DE on 8/15 with small pericardial effusion.  Continue colchicine for recent pericarditis.  Encourage IS.   2. 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  Cardiology and Nephrology following.  Pt had HD yesterday and plans for HD tomorrow.  3. Bilateral pleural  effusions.  Appreciate pulmonary asst.  Will repeat imaging after HD in AM.  Possible diagnostic/therapeutic thoracentesis if persistent.    4. Pericardial effusion. The patient's blood  pressure is currently stable. She does not have evidence of tamponade.  Appreciate Cardiology asst.   5. Recent viral pericarditis. Continue colchicine.   6. Anemia of chronic disease. The patient is with evidence of volume overload, potential dilutional. Will follow hemoglobin closely. The patient denies any episodes of black or bloody stools. Continue Aranesp per nephrology.   7. End-stage renal disease on hemodialysis. Dr. Leonette Most following.  8. HTN. Blood pressure currently well controlled. Continue home medications.   9. IDDM.  Continue Lantus. We will continue to monitor Accu-Cheks q.a.c. and nightly and administer sliding scale as needed.  10.  Recent left lower extremity deep venous thrombosis. The patient was previously on Coumadin; however, INR subtherapeutic. Given the potential for procedures, the patient has been started on heparin drip.   11. Gastrointestinal prophylaxis. Pepcid.   12. Deep venous thrombosis prophylaxis. The patient will be on a heparin drip.    DISPOSITION:   Continue to monitor closely on telemetry.     Laroy Apple, MD

## 2013-08-08 NOTE — Plan of Care (Signed)
Problem: Health Promotion  Goal: Vaccination Screening  All patients will be screened for current vaccination status on each admission.   Outcome: Completed Date Met:  08/08/13  Pt declines FLU vaccine but has already had PNA

## 2013-08-08 NOTE — Progress Notes (Addendum)
[facility]  PROGRESS NOTE  [name]     Date Time: 08/08/2013 8:45 AM  Patient Name: Mary [name], Mary [name]  Medical Record #:  [id number]  Account#:  [id number]  Admission Date:  08/07/2013       Patient Active Problem List   Diagnosis   . Cellulitis of left leg   . ESRD (end stage renal disease)   . DM (diabetes mellitus)   . Hypertension   . Cellulitis   . Chest pain at rest   . Viral pericarditis   . Dyspnea   . Chest pain   . Pleural effusion       Subjective:   Right chest discomfort (under right breast) with deep palpation..    Assessment:   Dyspnea and chest pain, likely acute on chronic diastolic CHF in setting of ESRD and pericardial effusion   Current discomfort more pleuritic and possibly more related to pleural effusions  Presumed viral pericarditis. Repeat echo 08/05/13 in our office consistent with small pericardial effusion with no evidence of right ventricular diastolic filling compromise  Echo free space in the mediastinum/left chest which cannot be fully visualized.  ESRD on HD   Diabetes   HLD   HTN = now on the low side.  Anemia   Ruled out for MI   LLE DVT - on coumadin: [place] 05/2013 with subtherapeutic INR on presentation  Echo 07/20/13: NL EF, sm to mod pericardial eff 1 cm, no tamponade   CT chest 07/19/13: No dissection aorta   CXR: Stable exam with small right pleural effusion and moderate left pleural  effusion.   EKG: 08/07/13 : SR 80 beats per minute with NSTwave abnormality  Obesity    Recommendation:   1. Continue hemodialysis for volume management.  2. Continue Heparin gtt continuous infusion.   3. Monitor HCT closely. Consider blood transfusions to maintain HCT 30 and above.  4.  Eventual Coumadin with target INR of 2.0-3.0.  5.  Pulmonary eval to consider +/- thoracentesis for diagnostic and therapeutic purposes.   6.  Continue colchicine, likely [place] in 7 days.   7. Please call if questions    Medications:      Scheduled Meds:         amLODIPine 5 mg Oral Daily   aspirin 81 mg Oral Daily    atorvastatin 10 mg Oral Daily   b complex-vitamin c-folic acid 1 tablet Oral Daily   bisacodyl 5 mg Oral Daily   colchicine 0.6 mg Oral Daily   gabapentin 300 mg Oral TID   [COMPLETED] heparin (porcine) 3,000 Units Intravenous Once   insulin glargine 12 Units Subcutaneous QHS   pantoprazole 40 mg Oral QAM AC   pregabalin 75 mg Oral Q12H [place]       Continuous Infusions:       . heparin 25000 units in dextrose 5% 500 mL 700 Units/hr (08/07/13 2210)          Physical Exam:     Filed Vitals:    08/08/13 0813   BP: 98/48   Pulse: 80   Temp: 97.7 F (36.5 C)   Resp: 18   SpO2: 95%     Telemetry : sinus rhythm     Intake and Output Summary (Last 24 hours) at Date Time    Intake/Output Summary (Last 24 hours) at 08/08/13 0845  Last data filed at 08/07/13 1700   Gross per 24 hour   Intake    220 ml   Output  2050 ml   Net  -1830 ml       General Appearance:  Breathing comfortable, no acute distress, obese  Head:  normocephalic  Eyes:  EOM's intact  Neck:  No carotid bruit or jugular venous distension, brisk carotid upstroke  Lungs:  Clear to auscultation throughout, no wheezes, rhonchi or rales, good respiratory effort   Chest Wall:  No tenderness or deformity  Heart:  S1, S2 normal, no S3, no S4, no murmur   Abdomen:  Soft, non-tender, positive bowel sounds,  Obese abdomen  Extremities:  Trace pretibial edema right over left.  Pulses:  Equal radial pulses, 4/4 symmetric  Neurologic:  Alert and oriented x3, mood and affect normal    Labs:      Lab 08/08/13 0127 08/07/13 1928 08/07/13 1016   CK 27* 31 34   TROPI <0.01 <0.01 <0.01   TROPT -- -- --   CKMBINDEX -- -- --       Lab 08/08/13 0645   BILITOTAL --   BILIDIRECT --   PROT --   ALB 2.3*   ALT --   AST --       Lab 08/08/13 0646 08/07/13 1015   PT -- 15.8*   INR -- 1.3*   PTT 56* --       Lab 08/08/13 0645 08/07/13 1016 08/02/13 0731   WBC 7.21 6.64 12.89*   HGB 7.9* 7.9* 8.3*   HCT 25.0* 24.1* 24.4*   PLT 247 257 325       Lab 08/08/13 0645 08/02/13 0731   NA 138 135*    K 4.2 4.9   CL 100 97*   CO2 29 25   BUN 22* 65*   CREAT 4.1* 7.2*   EGFR 13.1 6.8   GLU 168* 156*   CA 8.7 8.8       Rads:   Radiological Procedure reviewed.        Signed by: Oris Drone, NP    Patient seen and examined, assessment and plan reviewed. Cor: No rub. Please call if questions.     Timmothy Sours, MD [facility]  NP [place] (8am-5pm)  MD [facility] [phone] (8am-5pm)  After hours, non urgent consult line [phone]  After Hours, urgent consults [phone]

## 2013-08-08 NOTE — Consults (Signed)
PULMONARY CONSULTATION    Date Time: 08/08/2013 1:14 PM  Patient Name: Mary [name], Mary [name]  Requesting Physician: Trilby Drummer, MD    Pulmonary Attending addendum:   Seen with Ms Flavia Shipper examined, and reviewed CXRs 08/17 and from [facility] (not CT Scan) revealing marked hypoventilatory changes with possible effusions.  Unclear etiology right lateral chest wall or RUQ abdomen/flank pain, but significantly improved. Would reconsider scanning after period of pulmonary hygiene and dialysis.  Florina Ou MD [place]    Reason for Consultation:   Pleural effusions    HPI:   Mary [name]  is a 67 y.o. female with ESRD on HD and recent diagnosis of viral pericarditis (discharged 8/12/4) who presents to the hospital with pleuritic chest pain. She was initially seen at [facility] ED and transferred to [facility]. There she had a CT scan of which the family has a disc that we tried to access in radiology, however the disc contained only CXRs, no CT scan. The report stated "no adenopathy, bilateral effusions L>R, mild cardiomegaly and bilateral atelectasis. A CXR here revealed small right pleural effusion and moderate left pleural  effusion. She has an echo at the [place] office 08/05/13 was reportedly consistent with small pericardial effusion with no evidence of right ventricular diastolic filling compromise.    She describes the right chest pain as a "ball" that came on suddenly without trauma. It is pleuritic and there is point tenderness right lateral under her breast. She has had a dry cough for a couple weeks. Extra dialysis has been done recently and this has improved. She has an oxygen sat of 88% on RA at rest and 96% on 2 L NC. She has some DOE primarily and at rest denies SOB even off oxygen. She denies fever, chills, hemoptysis, nausea, vomiting, diarrhea, abdominal pain, GERD symptoms or seasonal allergies. She urinates minimally and is dependent on HD for fluid removal.  On admission her INR was 1.3 despite coumadin 5 mg  daily for a DVT LLE in June. She has been on a heparin drip for anticoagulation since admission.    Assessment:     1. Pleuritic chest pain with bilateral pleural effusions & atelectasis associated with hypoventilation- ? Spontaneous rib fracture  2. Recent viral pericarditis  3. Diastolic dysfunction  4. ESRD on HD  5. LLE DVT - on coumadin: [place] 05/2013 with subtherapeutic INR on presentation  6. DM  7. HTN  8. Anemia    Plan:   1. Continue dialysis and fluid removal  2. Repeat CXR PA/lat tomorrow after HD - patient needs to get OOB, use IS 10 times every hour, walk in hall to diminish atelectasis  3. PT & nursing to ambulate  4. DVT prophylaxis = on heparin drip  5. Code status = full      Past Medical History:     Past Medical History   Diagnosis Date   . Diabetes mellitus without complication    . Hyperlipidemia    . Hypertensive disorder    . Chronic kidney disease      dialysis t-tr-sat   . Arthritis    . Cataracts, bilateral    . GERD (gastroesophageal reflux disease)    . Acute deep vein thrombosis (DVT) of distal vein of left lower extremity    . Hemodialysis access site with mature fistula        Past Surgical History:     Past Surgical History   Procedure Date   . Av fistula  placement 2012       Family History:   History reviewed. No pertinent family history.    Social History:     History     Social History   . Marital Status: Widowed     Spouse Name: N/A     Number of Children: N/A   . Years of Education: N/A     Social History Main Topics   . Smoking status: Never Smoker    . Smokeless tobacco: Not on file   . Alcohol Use: No   . Drug Use: No   . Sexually Active:      Other Topics Concern   . Not on file     Social History Narrative   . No narrative on file       Allergies:     Allergies   Allergen Reactions   . Penicillins Hives   . Shrimp (Shellfish Allergy) Swelling       Medications:     Prescriptions prior to admission   Medication Sig   . acetaminophen (TYLENOL) 500 MG tablet Take 500 mg by mouth  every 6 (six) hours as needed.     Marland Kitchen amLODIPine (NORVASC) 5 MG tablet Take 1 tablet (5 mg total) by mouth daily.   Marland Kitchen aspirin 81 MG tablet Take 81 mg by mouth daily.     Marland Kitchen atorvastatin (LIPITOR) 10 MG tablet Take 10 mg by mouth daily.     Marland Kitchen b complex-vitamin c-folic acid (NEPHRO-VITE) 0.8 MG TABS Take 0.8 mg by mouth.   . bisacodyl (DULCOLAX) 5 MG EC tablet Take 5 mg by mouth daily as needed.     . colchicine 0.6 MG tablet Take 1 tablet (0.6 mg total) by mouth daily.   . cyclobenzaprine (FLEXERIL) 10 MG tablet Take 10 mg by mouth 3 (three) times daily as needed.   Marland Kitchen esomeprazole (NEXIUM) 40 MG capsule Take 40 mg by mouth every morning before breakfast.     . gabapentin (NEURONTIN) 300 MG capsule Take 300 mg by mouth 3 (three) times daily.   . insulin aspart (NOVOLOG) 100 UNIT/ML injection Inject 1-10 Units into the skin 3 (three) times daily before meals.     . insulin glargine (LANTUS) 100 UNIT/ML injection Inject 10-20 Units into the skin nightly.     . pregabalin (LYRICA) 75 MG capsule Take 75 mg by mouth 2 (two) times daily.     . traMADOL (ULTRAM) 50 MG tablet Take 1 tablet (50 mg total) by mouth every 6 (six) hours as needed for Pain.   . warfarin (COUMADIN) 5 MG tablet Take 1 tablet (5 mg total) by mouth daily.     Current Facility-Administered Medications   Medication Dose Route Frequency   . amLODIPine  5 mg Oral Daily   . aspirin  81 mg Oral Daily   . atorvastatin  10 mg Oral Daily   . b complex-vitamin c-folic acid  1 tablet Oral Daily   . bisacodyl  5 mg Oral Daily   . colchicine  0.6 mg Oral Daily   . gabapentin  300 mg Oral TID   . [COMPLETED] heparin (porcine)  3,000 Units Intravenous Once   . insulin glargine  12 Units Subcutaneous QHS   . pantoprazole  40 mg Oral QAM AC   . pregabalin  75 mg Oral Q12H [place]          . heparin 25000 units in dextrose 5% 500 mL 700 Units/hr (08/07/13 2210)  Review of Systems:   A comprehensive review of 10 systems was completed and are negative except as in  HPI    Physical Exam:     Filed Vitals:    08/08/13 0053 08/08/13 0423 08/08/13 0813 08/08/13 1239   BP: 114/58 116/65 98/48 108/50   Pulse: 89 85 80 84   Temp: 97.6 F (36.4 C) 97.4 F (36.3 C) 97.7 F (36.5 C) 97 F (36.1 C)   TempSrc: Oral Oral Oral    Resp: 16 18 18 20    Height:       Weight:       SpO2: 95% 94% 95% 91%     Temp (24hrs), Avg:97.4 F (36.3 C), Min:96.7 F (35.9 C), Max:97.8 F (36.6 C)      General Appearance:  Breathing comfortably, no acute distress  HEENT:  Sclera anicteric, PERRLA, oral mucosa moist  Neck:  No carotid bruit or jugular venous distension, no adenopathy  Lungs:  Diminished bases  Heart:  Regular rhythm,  no murmur   Abdomen:  Soft, non-tender, obese  Extremities:  No clubbing, trace LLE edema, LUE fistula, good bruit  Pulses:  Equal pulses, 4/4 symmetric  Neurologic:  Alert and oriented, moves all extremities equally    Intake and Output Summary (Last 24 hours) at Date Time    Intake/Output Summary (Last 24 hours) at 08/08/13 1314  Last data filed at 08/07/13 1700   Gross per 24 hour   Intake    220 ml   Output   2050 ml   Net  -1830 ml       Labs Reviewed:     Lab 08/08/13 0127 08/07/13 1928 08/07/13 1016   CK 27* 31 34   TROPI <0.01 <0.01 <0.01   TROPT -- -- --   CKMBINDEX -- -- --       Lab 08/08/13 0645   BILITOTAL --   BILIDIRECT --   PROT --   ALB 2.3*   ALT --   AST --       Lab 08/08/13 0646 08/07/13 1015   PT -- 15.8*   INR -- 1.3*   PTT 56* --       Lab 08/08/13 0645 08/07/13 1016 08/02/13 0731   WBC 7.21 6.64 12.89*   HGB 7.9* 7.9* 8.3*   HCT 25.0* 24.1* 24.4*   PLT 247 257 325       Lab 08/08/13 0645 08/02/13 0731   NA 138 135*   K 4.2 4.9   CL 100 97*   CO2 29 25   BUN 22* 65*   CREAT 4.1* 7.2*   EGFR 13.1 6.8   GLU 168* 156*   CA 8.7 8.8       Microbiology:     Rads:   Radiological Procedure reviewed.     8/17 CXR  Stable exam with small right pleural effusion and moderate left pleural  effusion.      Signed by: Chyrl Civatte NP  [facility]

## 2013-08-09 ENCOUNTER — Encounter: Payer: Self-pay | Admitting: Acute Care

## 2013-08-09 LAB — CBC AND DIFFERENTIAL
Basophils Absolute Automated: 0.02 (ref 0.00–0.20)
Basophils Automated: 0 %
Eosinophils Absolute Automated: 0.18 (ref 0.00–0.70)
Eosinophils Automated: 2 %
Hematocrit: 25 % — ABNORMAL LOW (ref 37.0–47.0)
Hgb: 8.3 g/dL — ABNORMAL LOW (ref 12.0–16.0)
Immature Granulocytes Absolute: 0.01
Immature Granulocytes: 0 %
Lymphocytes Absolute Automated: 2.5 (ref 0.50–4.40)
Lymphocytes Automated: 30 %
MCH: 29.3 pg (ref 28.0–32.0)
MCHC: 33.2 g/dL (ref 32.0–36.0)
MCV: 88.3 fL (ref 80.0–100.0)
MPV: 11.3 fL (ref 9.4–12.3)
Monocytes Absolute Automated: 0.46 (ref 0.00–1.20)
Monocytes: 6 %
Neutrophils Absolute: 5.26 (ref 1.80–8.10)
Neutrophils: 62 %
Nucleated RBC: 0 (ref 0–1)
Platelets: 304 (ref 140–400)
RBC: 2.83 — ABNORMAL LOW (ref 4.20–5.40)
RDW: 14 % (ref 12–15)
WBC: 8.42 (ref 3.50–10.80)

## 2013-08-09 LAB — CBC
Hematocrit: 24.1 % — ABNORMAL LOW (ref 37.0–47.0)
Hgb: 7.9 g/dL — ABNORMAL LOW (ref 12.0–16.0)
MCH: 29 pg (ref 28.0–32.0)
MCHC: 32.8 g/dL (ref 32.0–36.0)
MCV: 88.6 fL (ref 80.0–100.0)
MPV: 10.6 fL (ref 9.4–12.3)
Nucleated RBC: 0 (ref 0–1)
Platelets: 273 (ref 140–400)
RBC: 2.72 — ABNORMAL LOW (ref 4.20–5.40)
RDW: 14 % (ref 12–15)
WBC: 7.89 (ref 3.50–10.80)

## 2013-08-09 LAB — APTT
PTT: 83 — ABNORMAL HIGH (ref 23–37)
PTT: 56 — ABNORMAL HIGH (ref 23–37)

## 2013-08-09 LAB — PT/INR
PT INR: 1.1 (ref 0.9–1.1)
PT: 14.4 (ref 12.6–15.0)

## 2013-08-09 LAB — RENAL FUNCTION PANEL
Albumin: 2.5 g/dL — ABNORMAL LOW (ref 3.5–5.0)
Anion Gap: 12 (ref 5.0–15.0)
BUN: 39 mg/dL — ABNORMAL HIGH (ref 7–19)
CO2: 23 (ref 22–29)
Calcium: 8.7 mg/dL (ref 8.5–10.5)
Chloride: 96 — ABNORMAL LOW (ref 98–107)
Creatinine: 5.1 mg/dL — ABNORMAL HIGH (ref 0.6–1.0)
Glucose: 325 mg/dL — ABNORMAL HIGH (ref 70–100)
Phosphorus: 5.1 mg/dL — ABNORMAL HIGH (ref 2.3–4.7)
Potassium: 4.3 (ref 3.5–5.1)
Sodium: 131 — ABNORMAL LOW (ref 136–145)

## 2013-08-09 LAB — GFR: EGFR: 10.1

## 2013-08-09 LAB — POCT GLUCOSE
Whole Blood Glucose POCT: 170 mg/dL — AB (ref 70–100)
Whole Blood Glucose POCT: 247 mg/dL — AB (ref 70–100)
Whole Blood Glucose POCT: 186 mg/dL — AB (ref 70–100)
Whole Blood Glucose POCT: 228 mg/dL — AB (ref 70–100)

## 2013-08-09 MED ORDER — SODIUM CHLORIDE 0.9 % IV BOLUS
250.0000 mL | INTRAVENOUS | Status: AC | PRN
Start: 2013-08-09 — End: 2013-08-09

## 2013-08-09 MED ORDER — HEPARIN SODIUM (PORCINE) 1000 UNIT/ML IJ SOLN
1000.0000 [IU] | INTRAMUSCULAR | Status: AC | PRN
Start: 2013-08-09 — End: 2013-08-09

## 2013-08-09 MED ORDER — DARBEPOETIN ALFA-POLYSORBATE 100 MCG/0.5ML IJ SOLN
100.0000 ug | Freq: Once | INTRAMUSCULAR | Status: AC
Start: 2013-08-09 — End: 2013-08-09
  Administered 2013-08-09: 100 ug via SUBCUTANEOUS
  Filled 2013-08-09: qty 0.5

## 2013-08-09 MED ORDER — SODIUM CHLORIDE 0.9 % IV BOLUS
100.0000 mL | INTRAVENOUS | Status: AC | PRN
Start: 2013-08-09 — End: 2013-08-09

## 2013-08-09 MED ORDER — LIDOCAINE-TRANSPARENT DRESSING 4 % EX KIT
PACK | CUTANEOUS | Status: DC | PRN
Start: 2013-08-09 — End: 2013-08-19
  Filled 2013-08-09: qty 1

## 2013-08-09 MED ORDER — SIMETHICONE 80 MG PO CHEW
80.0000 mg | CHEWABLE_TABLET | Freq: Four times a day (QID) | ORAL | Status: DC | PRN
Start: 2013-08-09 — End: 2013-08-19
  Administered 2013-08-09 – 2013-08-13 (×4): 80 mg via ORAL
  Filled 2013-08-09 (×4): qty 1

## 2013-08-09 NOTE — OT Eval Note (Signed)
[facility]   Occupational Therapy Evaluation     Patient: Mary [name]      MRN#: [id number]   Unit: T4C TELEMETRY  Bed: [id number]  Time of treatment:Start Time: 1108 Stop Time: 1129 Time Calculation (min): 21 min    Consult received for Mary [name] for OT Evaluation and Treatment.  Patient's medical condition is appropriate for Occupational therapy intervention at this time.    Assessment:   Assessment: Patient with mild discomfort in right rib cage area from pleurisy, however is independent with all aspects of self-care, transfers and mobility in the room with good balance and safety awareness.  No skilled intervention indicated.   Prognosis: Good to return home with no needs.    Interdisciplinary Communication   Patient is walking in room and call bell/bed alarm set within reach.     Plan:      Progress: Improving as expected  Discharge Recommendation: Home with no needs     OT Frequency Recommended: one time visit       Goals:   Patient Goal: Return home ASAP      Education:   Educated patient/caregiver to role of occupational therapy, plan of care, home safety, next appropriate level of care, and ECT.   Demonstrated good understanding.       Evaluation:   Precautions and Contraindications:   Precautions  Weight Bearing Status: no restrictions    Medical Diagnosis: Chest pain [786.50]  Pleural effusion [511.9]  Chest pain  Chest pain    Rehab Diagnosis: pain in joint , generalized muscle weakness, decreased coordination      History of Present Illness: Mary [name] is a 67 y.o. female admitted on 08/07/2013 with pleurisy.    Patient Active Problem List   Diagnosis   . Cellulitis of left leg   . ESRD (end stage renal disease)   . DM (diabetes mellitus)   . Hypertension   . Cellulitis   . Chest pain at rest   . Viral pericarditis   . Dyspnea   . Chest pain   . Pleural effusion        Past Medical/Surgical History:  Past Medical History   Diagnosis Date   . Diabetes mellitus without  complication    . Hyperlipidemia    . Hypertensive disorder    . Chronic kidney disease      dialysis t-tr-sat   . Arthritis    . Cataracts, bilateral    . GERD (gastroesophageal reflux disease)    . Acute deep vein thrombosis (DVT) of distal vein of left lower extremity    . Hemodialysis access site with mature fistula       Past Surgical History   Procedure Date   . Av fistula placement 2012         Social History:  Prior Level of Function  Prior level of function: Ambulates / Performs ADL's independently  Baseline Activity Level: Community ambulation  Driving: independent  Dressing - Upper Body: independent  Dressing - Lower Body: independent  Feeding: independent  Bathing: independent  Grooming: independent  Toileting: independent  DME Currently at Home: Single point cane  Home Living Arrangements  Living Arrangements: Children  Type of Home: [place]: One level;Stairs to enter with rails (one flight to get to first floor)  Bathroom Shower/Tub: Administrator, [employer]: Standard  DME Currently at Home: Single point cane    Subjective:   Subjective: Patient reports that she  has been going to the bathroom by herself and bathed herself this morning without difficulty.  Pain Assessment  Pain Assessment: No/denies pain    Objective:  Inspection/Posture: Patient received at EOB.    Patient is agreeable to evaluation.    Cognitive Status and Neuro Exam:  Cognition  Arousal/Alertness: Appropriate responses to stimuli  Attention Span: Appears intact  Orientation Level: Oriented X4  Memory: Appears intact  Following Commands: independent  Safety Awareness: independent  Insights: Fully aware of deficits  Neuro Status  Behavior: calm cooperative  Motor Planning: intact  Coordination: intact    Musculoskeletal Examination  Gross ROM  Right Upper Extremity ROM: within functional limits  Left Upper Extremity ROM: within functional limits  Gross Strength  Right Upper Extremity Strength: within functional  limits  Left Upper Extremity Strength: within functional limits  Right Lower Extremity Strength: within functional limits  Tone  Tone: within functional limits    Activities of Daily Living  Self-care and Home Management  Eating: independently  Grooming: independently  Bathing: independently  UE Dressing: independently  LE Dressing: independently  Toileting: independently  Functional Transfers: independently  Item Retrieval: independently    Functional Mobility:  Mobility and Transfers  Functional Mobility/Ambulation: Independently     Balance  Balance  Static Sitting Balance: independently  Dyanamic Sitting Balance: independently  Static Standing Balance: independently  Dynamic Standing Balance: independently;modified independently    Participation and Activity Tolerance  Participation Effort: excellent  Endurance: Tolerates 30 min exercise with multiple rests    Discharge:  Home with no needs        Signature: Mary [name], OT  08/09/2013  11:38 AM

## 2013-08-09 NOTE — Progress Notes (Signed)
Pt had dialysis today and did not want any of her meds until after dialysis.  Night RN will give the daily meds.

## 2013-08-09 NOTE — Progress Notes (Signed)
Date Time: 08/09/2013 11:45 AM  Patient Name: Lichtenwalner,Janeice G    Subjective:   Denies any new complaints.  Reports significant overall improvement.  Ambulating in hallway earlier this AM with no difficulty.    No chest pain or shortness of breath; continues with intermittent cough.  No nausea.     Objective:   VITALS:  Filed Vitals:    08/08/13 2040 08/08/13 2353 08/09/13 0513 08/09/13 0815   BP: 124/64 123/65 141/69 134/66   Pulse: 82 88 89 97   Temp: 95.7 F (35.4 C) 97.1 F (36.2 C) 96.6 F (35.9 C) 97.3 F (36.3 C)   TempSrc: Oral Oral Oral    Resp: 18 18 18 18    Height:       Weight:       SpO2: 93% 93% 93% 100%     Intake and Output Summary (Last 24 hours) at Date Time  No intake or output data in the 24 hours ending 08/09/13 0817    PHYSICAL EXAM:  GEN APPEARANCE: No acute distress, sitting on side of bed  HEENT: PERLA; EOMI; Conjunctiva Clear  NECK: Supple; FROM; no JVD  CVS: RRR, S1, S2; No M/G/R  LUNGS: decreased basilar bs; no rales or rhonchi  ABD: Soft; NT/ND; Normoactive BS  EXT: trace BLE edema  SKIN: No rash or lesions  NEURO: CN 2-12 intact; No gross focal neurological deficits  PSYCH: Alert and oriented x3. Appropriate affect.     Labs and Diagnostic Data:       Lab 08/09/13 1530 08/09/13 0627 08/08/13 0645   WBC 8.42 7.89 7.21   HGB 8.3* 7.9* 7.9*   HCT 25.0* 24.1* 25.0*   MCV 88.3 88.6 90.3   PLT 304 273 247       Lab 08/09/13 1530 08/08/13 0645   NA 131* 138   K 4.3 4.2   CL 96* 100   CO2 23 29   BUN 39* 22*   CREAT 5.1* 4.1*   GLU 325* 168*   CA 8.7 8.7       Results     Procedure Component Value Units Date/Time    APTT [id number]  (Abnormal) Collected:08/09/13 0627     PTT 83 (H) Updated:08/09/13 0716     APTT Anticoag. Given w/i 48 hrs. heparin     Protime-INR [id number] Collected:08/09/13 0627    Specimen Information:Blood Updated:08/09/13 0711     PT 14.4      PT INR 1.1      PT Anticoag. Given Within 48 hrs. heparin     CBC [id number]  (Abnormal) Collected:08/09/13 0627    Specimen  Information:Blood / Blood Updated:08/09/13 0706     WBC 7.89      RBC 2.72 (L)      Hgb 7.9 (L) g/dL      Hematocrit 24.1 (L) %      MCV 88.6 fL      MCH 29.0 pg      MCHC 32.8 g/dL      RDW 14 %      Platelets 273      MPV 10.6 fL      Nucleated RBC 0     Narrative:    Per low intensity heparin protocol.    POCT glucose (AC and HS) [id number]  (Abnormal) Collected:08/09/13 0541     POCT Glucose WB 170 (A) mg/dL Updated:08/09/13 0541    APTT [id number] Collected:08/08/13 2035     PTT 35 Updated:08/08/13 2110  APTT Anticoag. Given w/i 48 hrs. heparin     POCT glucose (AC and HS) [id number]  (Abnormal) Collected:08/08/13 1700     POCT Glucose WB 208 (A) mg/dL Updated:08/08/13 1713        Radiological Studies:   Imaging reviewed.  CXR (8/17): Stable exam with small right pleural effusion and moderate left pleural effusion.    Current Medications:     Current Facility-Administered Medications   Medication Dose Route Frequency   . amLODIPine  5 mg Oral Daily   . aspirin  81 mg Oral Daily   . atorvastatin  10 mg Oral Daily   . b complex-vitamin c-folic acid  1 tablet Oral Daily   . bisacodyl  5 mg Oral Daily   . colchicine  0.6 mg Oral Daily   . gabapentin  300 mg Oral TID   . insulin glargine  12 Units Subcutaneous QHS   . pantoprazole  40 mg Oral QAM AC   . pregabalin  75 mg Oral Q12H [place]     Assessment and Plan:   UNDINE VANEATON is a 67 y.o. female with PMH significant for recent hospitalizations in the setting of viral pericarditis with moderate pericardial effusion and bilateral pleural effusions who was previously diagnosed with left lower extremity deep venous thrombosis, history of end-stage renal disease on hemodialysis, hypertension, hyperlipidemia, and insulin-dependent diabetes who was transferred from [facility] this morning secondary to recurrent chest pain with associated shortness of breath.     1. Chest pain, atypical. Appreciate Cardiology and Pulmonary asst.  CXR with bilateral  pleural effusions.  Plan for HD for volume removal.  Case discussed with Dr. Elder Negus on 8/18 who reported that 2DE on 8/15 with small pericardial effusion.  Continue colchicine for recent pericarditis.  Encourage IS.   2. 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  Cardiology and Nephrology following.  HD today.   3. Bilateral pleural effusions.  Appreciate pulmonary asst.  Will repeat imaging after HD. Possible diagnostic/therapeutic thoracentesis if persistent effusions.    4. Pericardial effusion. The patient's blood pressure is currently stable. She does not have evidence of tamponade.  Appreciate Cardiology asst.   5. Recent viral pericarditis. Continue colchicine.   6. Anemia of chronic disease. The patient denies any episodes of black or bloody stools. Continue Aranesp per nephrology.   7. End-stage renal disease on hemodialysis. Dr. Leonette Most following.  8. HTN. Blood pressure currently well controlled. Continue home medications.   9. IDDM.  Continue Lantus. We will continue to monitor Accu-Cheks q.a.c. and nightly and administer sliding scale as needed.  10. Recent left lower extremity deep venous thrombosis. The patient was previously on Coumadin; however, INR subtherapeutic. Given the potential for procedures, the patient has been started on heparin drip.   11. Gastrointestinal prophylaxis. Pepcid.   12. Deep venous thrombosis prophylaxis. The patient will be on a heparin drip.    DISPOSITION:   Continue to monitor closely on telemetry.     Laroy Apple, MD

## 2013-08-09 NOTE — Plan of Care (Signed)
Problem: Renal Failure  Goal: Fluid and electrolyte balance are achieved/maintained  Outcome: Progressing  Patient scheduled for dialysis on Tuesday, Thursday and Saturday. Lungs diminished but clear. Non pitting edema noted to bilateral lower extremities. Encouraged to keep legs   Elevated when sitting. AM labs drawn. Awaiting results.

## 2013-08-09 NOTE — Progress Notes (Signed)
08/09/13 1845   Treatment Summary   Time Off Machine 1845   Duration of Treatment (Hours) 3.5   Dialyzer Clearance Heavily streaked   Fluid Volume Off (mL) 2700    Prime Volume (mL) 200    Rinseback Volume (mL) 300    Fluid Given: Normal Saline (mL) 100    Fluid Given: PRBC  0 mL   Fluid Given: Albumin (mL) 0    Fluid Given: Other (mL) 0    Total Fluid Given 600    Hemodialysis Net Fluid Removed 2100    Post Treatment Assessment   Post-Treatment Weight (Kg) 213.7    Patient Response to Treatment bp stable   Information for Next Treatment thursday   Additional Dialyzer Used 0   Graft/Fistula Left   No Placement Date or Time found.   Present on Admission?: Yes  Orientation: Left  Graft/Fistula Location: Upper Arm   Fistula/ Graft Assessment Abnormalities WDL   Needle Size 15 Gauge   Cannulation Sites held Arterial (min) 15   Cannulation Sites held Venous (min) 15   Hemostasis Achieved Yes   Vitals   Temp 97.2 F (36.2 C)   Heart Rate 80    Resp Rate 16    BP 126/65 mmHg   SpO2 98 %   O2 Device None (Room air)   Assessment   Mental Status Alert;Oriented   Cardiac (WDL) WDL   Cardiac Regularity Regular   Cardiac Symptoms None   Cardiac Rhythm Normal Sinus Rhythm   Respiratory  WDL   Respiratory Pattern Regular   Bilateral Breath Sounds Clear   R Breath Sounds Clear   L Breath Sounds Clear   Cough Non-productive   Edema  WDL   Generalized Edema None   Facial None   Sacral None   RLE Edema Non Pitting Edema   LLE Edema Non Pitting Edema   General Skin Color Appropriate for ethnicity   Skin Condition/Temp Warm;Dry   Gastrointestinal (WDL) WDL   Abdomen Inspection Soft   GI Symptoms None   Mobility Ambulatory with Assistance   Education   Person taught Patient   Knowledge basis Minimal   Topics taught Access care;Procedure   Teaching Tools Explain   Bedside Nurse Communication   Name of bedside RN - pre dialysis sylvia,rn   Name of bedside RN - post dialysis sylvia,rn

## 2013-08-09 NOTE — Progress Notes (Signed)
Told pt not going to give bp med this am until after dialysis. Pt said does not want to take her morning meds until after dialysis.  Will monitor for needs

## 2013-08-09 NOTE — Progress Notes (Signed)
08/09/13 1845   Vitals   Heart Rate 80    Resp Rate 16    BP 126/65 mmHg   SpO2 98 %   O2 Device None (Room air)   Machine Metrics   Blood Flow Rate (mL/min) 350 mL/min   Arterial Pressure (mmHg) -150 mmHg   Venous Pressure (mmHg) 110    Dialysate Flow Rate (mL/min) 700 mL/min   Transmembrane Pressure (mmHg) 100 mmHg   Ultrafiltration Rate (mL/Hr) 770 mL/hr   Fluid Removal (ml) 2700 ml   Fluid Bolus (ml) 300 ml  (dialysis completed, off rinse back)   Hemodialysis Comments   Arteriovenous Lines Secure Yes   Comments tx completed

## 2013-08-09 NOTE — Progress Notes (Signed)
Mary [name] is a 67 y.o. female patient with PMH notable for  DM, HTN, ESRD on hemodialysis, admitted because of SOB.    Active Problems:   Chest pain    Past Medical History   Diagnosis Date   . Diabetes mellitus without complication    . Hyperlipidemia    . Hypertensive disorder    . Chronic kidney disease      dialysis t-tr-sat   . Arthritis    . Cataracts, bilateral    . GERD (gastroesophageal reflux disease)    . Acute deep vein thrombosis (DVT) of distal vein of left lower extremity    . Hemodialysis access site with mature fistula      Current Facility-Administered Medications   Medication Dose Route Frequency Provider Last Rate Last Dose   . acetaminophen (TYLENOL) tablet 650 mg  650 mg Oral Q6H PRN Laroy Apple, MD       . amLODIPine (NORVASC) tablet 5 mg  5 mg Oral Daily Laroy Apple, MD       . aspirin chewable tablet 81 mg  81 mg Oral Daily Laroy Apple, MD   81 mg at 08/08/13 0912   . atorvastatin (LIPITOR) tablet 10 mg  10 mg Oral Daily Laroy Apple, MD   10 mg at 08/08/13 0913   . b complex-vitamin c-folic acid (NEPHRO-VITE) tablet 1 tablet  1 tablet Oral Daily Laroy Apple, MD   1 tablet at 08/08/13 [id number]   . bisacodyl (DULCOLAX) EC tablet 5 mg  5 mg Oral Daily Noralyn Pick, MD   5 mg at 08/08/13 [id number]   . colchicine tablet 0.6 mg  0.6 mg Oral Daily Laroy Apple, MD   0.6 mg at 08/08/13 0913   . dextrose (GLUCOSE) 40 % oral gel 15 g  15 g Oral PRN Laroy Apple, MD       . dextrose 50 % bolus 25 mL  25 mL Intravenous PRN Laroy Apple, MD       . gabapentin (NEURONTIN) capsule 300 mg  300 mg Oral TID Laroy Apple, MD   300 mg at 08/08/13 2229   . glucagon (rDNA) (GLUCAGEN) injection 1 mg  1 mg Intramuscular PRN Laroy Apple, MD       . heparin (porcine) injection 1,000 Units  1,000 Units Intracatheter PRN Alric Quan, MD       . heparin (porcine) injection 3,000 Units  3,000 Units  Intravenous PRN Noralyn Pick, MD   3,000 Units at 08/08/13 2352   . heparin 25000 units in dextrose 5% 500 mL infusion (premix)  1,000 Units/hr Intravenous Continuous Noralyn Pick, MD 15 mL/hr at 08/09/13 0745 750 Units/hr at 08/09/13 0745   . insulin glargine (LANTUS) injection 12 Units  12 Units Subcutaneous QHS Laroy Apple, MD   12 Units at 08/08/13 2229   . insulin regular (HumuLIN R,NovoLIN R) injection 1-8 Units  1-8 Units Subcutaneous TID AC PRN Laroy Apple, MD   3 Units at 08/09/13 1201   . lidocaine (LMX PLUS 4%) 4 % dressing   Topical PRN Alric Quan, MD   1 each at 08/07/13 1050   . pantoprazole (PROTONIX) EC tablet 40 mg  40 mg Oral QAM AC Laroy Apple, MD   40 mg at 08/09/13 0946   . pregabalin (LYRICA) capsule 75 mg  75 mg Oral Q12H [place] Laroy Apple, MD   75 mg  at 08/08/13 2229   . simethicone (MYLICON) chewable tablet 80 mg  80 mg Oral Q6H PRN Noralyn Pick, MD   80 mg at 08/09/13 0106   . sodium chloride 0.9 % bolus 100 mL  100 mL Other Q1H PRN Alric Quan, MD       . sodium chloride 0.9 % bolus 250 mL  250 mL Intravenous PRN Alric Quan, MD       . traMADol Veatrice Bourbon) tablet 50 mg  50 mg Oral Q6H PRN Laroy Apple, MD   50 mg at 08/08/13 1141   . [DISCONTINUED] heparin 25000 units in dextrose 5% 500 mL infusion (premix)  1,000 Units/hr Intravenous Continuous Laroy Apple, MD 14 mL/hr at 08/08/13 1940 700 Units/hr at 08/08/13 1940     Allergies   Allergen Reactions   . Penicillins Hives   . Shrimp (Shellfish Allergy) Swelling     Blood pressure 141/71, pulse 87, temperature 97 F (36.1 C), temperature source Oral, resp. rate 18, height 1.626 m (5\' 4" ), weight 93.441 kg (206 lb), SpO2 97.00%.    Subjective:  Symptoms:  She reports shortness of breath.    Diet:  Adequate intake.    Activity level: Impaired due to weakness.      Objective:  General Appearance:  In no acute  distress.    Vital signs: (most recent): Blood pressure 141/71, pulse 87, temperature 97 F (36.1 C), temperature source Oral, resp. rate 18, height 1.626 m (5\' 4" ), weight 93.441 kg (206 lb), SpO2 97.00%.    Output: No urine output and producing stool.    Lungs:  There are decreased breath sounds.    Heart: Normal rate.    Neurological: Patient is alert.    Skin:  Warm.    Abdomen: Abdomen is soft.      Results     Procedure Component Value Units Date/Time    POCT glucose (AC and HS) [id number]  (Abnormal) Collected:08/09/13 1157     POCT Glucose WB 247 (A) mg/dL Updated:08/09/13 1157    APTT [id number]  (Abnormal) Collected:08/09/13 0627     PTT 83 (H) Updated:08/09/13 0716     APTT Anticoag. Given w/i 48 hrs. heparin     Protime-INR [id number] Collected:08/09/13 0627    Specimen Information:Blood Updated:08/09/13 0711     PT 14.4      PT INR 1.1      PT Anticoag. Given Within 48 hrs. heparin     CBC [id number]  (Abnormal) Collected:08/09/13 0627    Specimen Information:Blood / Blood Updated:08/09/13 0706     WBC 7.89      RBC 2.72 (L)      Hgb 7.9 (L) g/dL      Hematocrit 24.1 (L) %      MCV 88.6 fL      MCH 29.0 pg      MCHC 32.8 g/dL      RDW 14 %      Platelets 273      MPV 10.6 fL      Nucleated RBC 0     Narrative:    Per low intensity heparin protocol.    POCT glucose (AC and HS) [id number]  (Abnormal) Collected:08/09/13 0541     POCT Glucose WB 170 (A) mg/dL Updated:08/09/13 0541    APTT [id number] Collected:08/08/13 2035     PTT 35 Updated:08/08/13 2110     APTT Anticoag. Given w/i 48 hrs. heparin     POCT glucose (AC and HS) [  [id number]  (Abnormal) Collected:08/08/13 1700     POCT Glucose WB 208 (A) mg/dL Updated:08/08/13 1713      Echocardiogram Adult Complete W Clr/ Dopp Waveform    08/01/2013  impression: 1. Atrial septal aneurysm is present. 2. There is a small to moderate pericardial effusion noted both anteriorly and posteriorly.  this is only slightly increased from the previous  echocardiogram dated 07/20/2013. Variation of tricuspid flow velocity is noted. There is no diastolic collapse of the right ventricle noted. There is organized material note noted in the apex of the right ventricle not previously reported. Small small pleural effusion is present. Clinical correlation is required considering these echocardiographic changes. 3. There are no other significant echogram echocardiographic changes noted.  Lennon Alstrom, MD  08/01/2013 2:03 PM     Echocardiogram Adult Complete W Color Doppler Waveform    07/20/2013   Normal left ventricular systolic function. No significant valvular heart disease. Small pericardial effusion without evidence of cardiac tamponade.    Audria Nine, MD  07/20/2013 4:09 PM     Ct Abdomen Pelvis Wo Iv/ Wo Po Cont    07/20/2013   1. Interval development of a minimal to moderate pericardial effusion and trace pleural effusions since prior exam one day earlier. 2. No detectable acute abnormalities in the abdomen or pelvis. 3. Contrast residue gallbladder, kidneys, and bladder. 4. Numerous small low-attenuation kidney lesions. 5. Remainder as above. 6. Findings discussed with Dr. Beryle Lathe by Dr. Oren Binet at 3:59 AM.  Sheria Lang, MD  07/20/2013 8:23 AM     Ct Angio Chest    07/19/2013   1. No CT scan evidence for detectable pulmonary embolism. 2. Minimal cardiomegaly. 3. Minimal bibasilar lung atelectasis. 4. Remainder as above.  Sheria Lang, MD  07/19/2013 3:18 PM     Xr Chest Ap Portable    08/07/2013   Stable exam with small right pleural effusion and moderate left pleural effusion.  Loel Lofty. Anna Genre, MD  08/07/2013 9:38 AM     Chest Ap Portable    07/30/2013    1. New bilateral pleural effusions with bibasilar airspace opacities. 2. Stable cardiomegaly.  Darin Engels, MD  07/30/2013 4:33 PM     Xr Chest Ap Portable    07/19/2013   Hypoinflation with minimal basilar atelectasis.  Sheria Lang, MD  07/19/2013 2:45 PM     Assessment:  (1.chest pain  2.pleural effusion  3.viral  pericarditis  4.ESRD  5.DM  6.MGUS  7.HTN  8.anemia).       Plan:   (1.Hemodialysis with UF today  2.aranesp sq q weekly  3.monitor serum electrolytes).       Alric Quan  08/09/2013

## 2013-08-09 NOTE — Progress Notes (Addendum)
[facility] Progress Note      Date Time: 08/09/2013 8:38 AM    Pulmonary Attending addendum:   Doing better     Lungs-decr. BS  Cor-RR    HD today  F/U CXR    Imp: and Plan as per discussion with Ms. Duggan      24 hr Events/Subjective:   Sitting EOB "I feel much better". Right pleuritic chest pain "almost gone, just a little twinge". sats 93% on RA, afebrile  To have HD this afternoon  Walked in hall 120 feet and climbed 4 stairs yesterday - minimal DOE   Using IS - 750 cc only but better effort    Assessment:     1. Pleuritic chest pain with bilateral pleural effusions & atelectasis associated with hypoventilation  2. Recent viral pericarditis  3. Diastolic dysfunction  4. ESRD on HD  5. LLE DVT - on coumadin: [place] 05/2013 with subtherapeutic INR on presentation  6. DM  7. HTN  8. Anemia    Plan:   1. HD today and repeat CXR PA/lat after  2. OOB - walk in hall to diminish atelectasis  3. IS 10 times every hour  4. DVT prophylaxis = on heparin drip  5. Code status = full      Medications:      Scheduled Meds: PRN Meds:         amLODIPine 5 mg Oral Daily   aspirin 81 mg Oral Daily   atorvastatin 10 mg Oral Daily   b complex-vitamin c-folic acid 1 tablet Oral Daily   bisacodyl 5 mg Oral Daily   colchicine 0.6 mg Oral Daily   gabapentin 300 mg Oral TID   insulin glargine 12 Units Subcutaneous QHS   pantoprazole 40 mg Oral QAM AC   pregabalin 75 mg Oral Q12H [place]       Continuous Infusions:       . heparin 25000 units in dextrose 5% 500 mL 750 Units/hr (08/09/13 0745)   . [DISCONTINUED] heparin 25000 units in dextrose 5% 500 mL 700 Units/hr (08/08/13 1940)         acetaminophen 650 mg Q6H PRN   dextrose 15 g PRN   dextrose 25 mL PRN   glucagon (rDNA) 1 mg PRN   heparin (porcine) 3,000 Units PRN   insulin regular 1-8 Units TID AC PRN   lidocaine  PRN   simethicone 80 mg Q6H PRN   traMADol 50 mg Q6H PRN           Physical Exam:     Filed Vitals:    08/08/13 2040 08/08/13 2353 08/09/13 0513 08/09/13 0815   BP: 124/64 123/65 141/69  134/66   Pulse: 82 88 89 97   Temp: 95.7 F (35.4 C) 97.1 F (36.2 C) 96.6 F (35.9 C) 97.3 F (36.3 C)   TempSrc: Oral Oral Oral    Resp: 18 18 18 18    Height:       Weight:       SpO2: 93% 93% 93% 100%     Temp (24hrs), Avg:96.7 F (35.9 C), Min:95.7 F (35.4 C), Max:97.3 F (36.3 C)    General Appearance:  Breathing comfortably, no acute distress  Lungs:    Clear, diminished bases  Heart:    regular rhythm,  no murmur   Abdomen:   soft, non-tender  Extremities:    Trace LE  edema  Neurologic:  Alert and oriented, moves all extremities equally    No intake or  output data in the 24 hours ending 08/09/13 0838    Active PICC Line / CVC Line / PIV Line / Drain / Airway / Intraosseous Line / Epidural Line / ART Line / Line / Wound / Pressure Ulcer / NG/OG Tube     Name   Placement date   Placement time   Site   Days    Peripheral IV 08/07/13 Right Other (Comment)  08/07/13      Other (Comment)   2    Graft/Fistula Left          --          Labs:     Lab 08/08/13 0645   GLU 168*        Lab 08/09/13 0627 08/08/13 0645 08/07/13 1016   WBC 7.89 7.21 6.64   RBC 2.72* 2.77* 2.66*   HGB 7.9* 7.9* 7.9*   HCT 24.1* 25.0* 24.1*   MCV 88.6 90.3 90.6   MCHC 32.8 31.6* 32.8   RDW 14 14 14    MPV 10.6 10.7 11.0   PLT 273 247 257       Lab 08/08/13 0645   ALT --   AST --   GLOB --   ALB 2.3*       Lab 08/08/13 0127 08/07/13 1928 08/07/13 1016   CK 27* 31 34   TROPI <0.01 <0.01 <0.01   TROPT -- -- --   CKMBINDEX -- -- --     Lab 08/08/13 0645   BILITOTAL --   BILIDIRECT --   PROT --   ALB 2.3*   ALT --   AST --       Lab 08/09/13 0627   PT 14.4   INR 1.1   PTT 83*       Lab 08/08/13 0645   NA 138   K 4.2   CL 100   CO2 29   BUN 22*   CREAT 4.1*   CA 8.7   ALB 2.3*   PROT --   BILITOTAL --   ALKPHOS --   ALT --   AST --   GLU 168*       Microbiology:     Rads:   Radiological Procedure reviewed    8/17 CXR   Stable exam with small right pleural effusion and moderate left pleural  effusion.    Signed:  Chyrl Civatte  NP  [facility]

## 2013-08-09 NOTE — Progress Notes (Signed)
08/09/13 1500   Bedside Nurse Communication   Name of bedside RN - pre dialysis sylvia,rn   Name of bedside RN - post dialysis sylvia,rn   Treatment Initiation- With Dialysis Precautions   Time Out/Safety Check Completed Yes   Consent for HD signed for this hospitalization Y   Blood Consent Verified N/A   Dialysis Precautions All Connections Secured   Dialysis Treatment Type Bedside   Special Considerations pt on heparin drip   Is patient diabetic? Yes   RO/Hemodialysis Architectural technologist   Is Total Chlorine less than 0.1 ppm? Yes   Orignial Total Chlorine Testing Time 1445   At 4 Hour Total Chlorine Testing Time 1845   RO/Hemodialysis Archivist Number 1    RO # 1    Water Hardness 0    pH 7.4    Pressure Test Verified Yes   Alarms Verified Passed   Machine Temperature 95.7 F (35.4 C)   Alarms Verified Yes   Hemodialysis Conductivity (Machine) 14.2    Hemodialysis Conductivity (Meter) 13.9    Dialyzer Lot Number [id number])   Tubing Lot Number [id number])   RO Machine Log Completed Yes   Hepatitis Status   HBsAg (Antigen) Result Negative   HBsAg Date Drawn 08/07/13   HBsAg Repeat Draw Due Date 09/04/13   HBsAb (Antibody) Result Non-Reactive   Dialysis Weight   Pre-Treatment Weight (Kg) 215.8    Scale Type ICU Bed Scale   Vitals   Temp ! 96 F (35.6 C)   Heart Rate 88    Resp Rate 16    BP 136/62 mmHg   SpO2 98 %   O2 Device None (Room air)   FiO2 (on roomair.)   Assessment   Mental Status Alert;Oriented   Cardiac (WDL) WDL   Cardiac Regularity Regular   Cardiac Symptoms None   Cardiac Rhythm Normal Sinus Rhythm   Respiratory  WDL   Respiratory Pattern Regular   Bilateral Breath Sounds Diminished   R Breath Sounds Diminished   L Breath Sounds Diminished   Cough Non-productive   Edema  X  (generalized)   Generalized Edema Non Pitting Edema   Facial None   Sacral None   RLE Edema +1   LLE Edema +1   General Skin Color Appropriate for ethnicity   Skin Condition/Temp Warm;Dry    Gastrointestinal (WDL) WDL   Abdomen Inspection Soft;Distended   GI Symptoms None   Mobility Bed   Pain Assessment   Charting Type Assessment   Pain Assessment Numeric Scale (0-10)   Pain Score 0   POSS Score 2   Hemodialysis Comments   Pre-Hemodialysis Comments pt identified, timeout done, general assessment done, assessed for pain, education given [id number] cream for fistula.

## 2013-08-09 NOTE — Procedures (Signed)
Stable 3.5 hrs tx, uf off net, 2.1liters, bp stable, vss, pt on heparin drip, fistula working very well at 350 bfr. Report given to sylvia,rn

## 2013-08-09 NOTE — Progress Notes (Signed)
Pt's PTT drawn at 1530 was 56.  No change in heparin rate.  Next ptt at [place] on 08/10/13

## 2013-08-09 NOTE — Progress Notes (Signed)
08/09/13 1515   Vitals   Heart Rate 86    Resp Rate 18    BP 130/68 mmHg   SpO2 98 %   O2 Device None (Room air)   Machine Metrics   $Treatment Started/Capturing Charge Yes   Blood Flow Rate (mL/min) 350 mL/min   Arterial Pressure (mmHg) -150 mmHg   Venous Pressure (mmHg) 110    Dialysate Flow Rate (mL/min) 700 mL/min   Transmembrane Pressure (mmHg) 100 mmHg   Ultrafiltration Rate (mL/Hr) 770 mL/hr   Fluid Removal (ml) 0 ml   Fluid Bolus (ml) 200 ml   Dialysate K (mEq/L) 2 mEq/L   Dialysate CA (mEq/L) 2.5 mEq/L   Hemodialysis Comments   Arteriovenous Lines Secure Yes   Comments tx initiated with very good bfr.

## 2013-08-09 NOTE — Plan of Care (Signed)
Problem: Renal Failure  Goal: Free from infection- Renal  Outcome: Progressing  Patient has been afebrile. WBC's unremarkable.  Will continue to monitor.

## 2013-08-10 ENCOUNTER — Inpatient Hospital Stay: Payer: Medicare Other

## 2013-08-10 LAB — POCT GLUCOSE
Whole Blood Glucose POCT: 219 mg/dL — AB (ref 70–100)
Whole Blood Glucose POCT: 143 mg/dL — AB (ref 70–100)
Whole Blood Glucose POCT: 247 mg/dL — AB (ref 70–100)
Whole Blood Glucose POCT: 252 mg/dL — AB (ref 70–100)
Whole Blood Glucose POCT: 277 mg/dL — AB (ref 70–100)

## 2013-08-10 LAB — CBC
Hematocrit: 25 % — ABNORMAL LOW (ref 37.0–47.0)
Hgb: 8.3 g/dL — ABNORMAL LOW (ref 12.0–16.0)
MCH: 29.3 pg (ref 28.0–32.0)
MCHC: 33.2 g/dL (ref 32.0–36.0)
MCV: 88.3 fL (ref 80.0–100.0)
MPV: 10.7 fL (ref 9.4–12.3)
Nucleated RBC: 0 (ref 0–1)
Platelets: 228 (ref 140–400)
RBC: 2.83 — ABNORMAL LOW (ref 4.20–5.40)
RDW: 14 % (ref 12–15)
WBC: 7.63 (ref 3.50–10.80)

## 2013-08-10 LAB — APTT
PTT: 51 — ABNORMAL HIGH (ref 23–37)
PTT: 49 — ABNORMAL HIGH (ref 23–37)
PTT: 51 — ABNORMAL HIGH (ref 23–37)

## 2013-08-10 MED ORDER — WARFARIN SODIUM 7.5 MG PO TABS
7.5000 mg | ORAL_TABLET | Freq: Once | ORAL | Status: AC
Start: 2013-08-10 — End: 2013-08-10
  Administered 2013-08-10: 7.5 mg via ORAL
  Filled 2013-08-10: qty 1

## 2013-08-10 MED ORDER — ATORVASTATIN CALCIUM 20 MG PO TABS
ORAL_TABLET | ORAL | Status: AC
Start: 2013-08-10 — End: 2013-08-10
  Administered 2013-08-10: 10 mg via ORAL
  Filled 2013-08-10: qty 1

## 2013-08-10 MED ORDER — PREGABALIN 75 MG PO CAPS
ORAL_CAPSULE | ORAL | Status: AC
Start: 2013-08-10 — End: 2013-08-10
  Administered 2013-08-10: 75 mg via ORAL
  Filled 2013-08-10: qty 1

## 2013-08-10 MED ORDER — GABAPENTIN 300 MG PO CAPS
ORAL_CAPSULE | ORAL | Status: AC
Start: 2013-08-10 — End: 2013-08-10
  Administered 2013-08-10: 300 mg via ORAL
  Filled 2013-08-10: qty 1

## 2013-08-10 NOTE — PT Progress Note (Signed)
Physical Therapy Note    [facility]  Physical Therapy Treatment    Patient:  Mary [name] MRN#:  [id number]  Unit:  [facility] Room/Bed:  [id number]    Assessment:   After today's treatment patient able to ambulate s device.  Assessment:  (STG achieved, pt at baseline x endurance)        Education:   Further education provided to patient/caregiver onHEP, safety with mobility and ADLs.  Demonstrated good understanding.      Plan:   Goals  Pt Will Ambulate: 151-200 feet;with single point cane;Goal met (c increased cadence pt ambulated s cane)       Discharge from PT Acute Care Services.         Treatment:     Time of treatment: Start Time: 1300 Stop Time: 1315  Time Calculation (min): 15 min  PT Received On: 08/10/13    Treatment #  1    Precautions  Weight Bearing Status: no restrictions    Patient's medical condition is appropriate for Physical Therapy intervention at this time.     Subjective:  Patient is agreeable to participation in the therapy session.      Pain Assessment  Pain Assessment: Numeric Scale (0-10)  Pain Score: 6-moderate pain  Pain Location:  (now L sided chest pain)          Objective:  Patient is in bed with telemetry in place.    Cognition  Arousal/Alertness: Appropriate responses to stimuli  Functional Mobility  Sit to Stand: Independent  Stand to Sit: Independent     Locomotion  Ambulation: independently  Ambulation Distance (Feet): 120 Feet  Pattern: decreased cadence (c min SOB)  Stair Management: independently;one rail R;step to pattern  Number of Stairs: [place], PT

## 2013-08-10 NOTE — Plan of Care (Signed)
Problem: Pain  Goal: Patient's pain/discomfort is manageable  Outcome: Progressing  Patient currently complains of intermittent pain to left side. Medications administered as ordered with good results.

## 2013-08-10 NOTE — Plan of Care (Signed)
Problem: Safety  Goal: Patient will be free from injury during hospitalization  Outcome: Progressing  Patient has been instructed to call for assistance prior to getting out of bed. Bed alarm has been activated all shift. Call light has been in reach. Hourly rounding has been performed all shift.

## 2013-08-10 NOTE — Progress Notes (Signed)
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   On heparin gtt, resume coumadin  Cont home asa statin, norvasc  Cont lantus ssi  Cont pepcid      Subjective:     No sob. Chest pain better    Objective:   VITALS:  Filed Vitals:    08/09/13 2243 08/10/13 0519 08/10/13 0744 08/10/13 0852   BP: 114/53 127/65  104/72   Pulse: 88 94  99   Temp: 98.6 F (37 C) 95.7 F (35.4 C)  96.5 F (35.8 C)   TempSrc: Oral Axillary  Oral   Resp: 18 18  18    Height:       Weight:   97.16 kg (214 lb 3.2 oz)    SpO2: 98% 92%  96%       Intake and Output Summary (Last 24 hours) at Date Time    Intake/Output Summary (Last 24 hours) at 08/10/13 1233  Last data filed at 08/10/13 0529   Gross per 24 hour   Intake    973 ml   Output   2100 ml   Net  -1127 ml       PHYSICAL EXAM:  GEN APPEARANCE: No acute distress  HEENT: PERLA; EOMI; Conjunctiva Clear  NECK: Supple; FROM  CVS: RRR, S1, S2; No M/[name]/R  LUNGS: CTAB; No wheezes, rales or rhonchi  ABD: Soft; NT/ND; Normoactive BS  EXT: No edema; Pulses 2+ and intact  NEURO: Alert and oriented x3No gross focal neurological deficits    Labs and Diagnostic Data:     Lab 08/10/13 0335 08/09/13 1530 08/09/13 0627   WBC 7.63 8.42 7.89   HGB 8.3* 8.3* 7.9*   HCT 25.0* 25.0* 24.1*   MCV 88.3 88.3 88.6   PLT 228 304 273         Lab 08/09/13 1530 08/08/13 0645   NA 131* 138   K 4.3 4.2   CL 96* 100   CO2 23 29   BUN 39* 22*   CREAT 5.1* 4.1*   GLU 325* 168*   CA 8.7 8.7         Lab 08/09/13 1530 08/08/13 0645   ALT -- --   AST -- --   GLOB -- --   ALB 2.5* 2.3*           Radiological Studies:   Echocardiogram Adult Complete W Clr/ Dopp  Waveform    08/01/2013  ECHOCARDIOGRAM                                  SONOGRAPHER:  Tilda Burrow, RDCS                        CLINICAL HISTORY-                       MEASUREMENTS       NORMAL RANGE   Left Atrium       45  (15-40 mm)   Aortic Root  30  (20-37 mm)   LV (Diastole)      54  (37-56 mm)   LV (Systole)     26  (20-40 mm)   LVPW (Diastole)     10  (  6-11 mm)   IVS (Diastole)    10  (  6-11 mm)   Right Ventricle     20  (  7-23 mm)   Fractional Shortening     50  ( 24-46  %)   Est. Ejection Fraction    70  (    60    %)   Aortic Arch: The aortic arch is nl             DOPPLER VELOCITIES      NORMAL RANGE Aortic Valve       190  (0.9-1.8 m/s)   Mitral Valve       98  (0.6-1.4 m/s)     Pulmonic Valve    116  (0.5-0.9 m/s)                 PROCEDURE: M-mode, 2D, spectral Doppler and color flow echocardiographic imaging was performed and interpreted.  FINDINGS:  The quality of the study is technically adequate for interpretation.  The left ventricle is normal in size. Left ventricular systolic function is normal. There are no regional wall motion abnormalities. Estimated  EF is 70 %.  There is no left ventricular hypertrophy.  There is no evidence of abnormal diastolic LV function.  The left atrium is dilated  The aortic valve is trileaflet.  Normal valve excursion is present. There is no significant aortic insufficiency. No aortic stenosis is present. The aortic root is normal in size.  The mitral valve is structurally normal. Trace mitral insufficiency is present.  The right ventricle is normal in size and function.  The right atrium is normal in size.  The tricuspid valve is structurally normal. There is trace tricuspid insufficiency.  The pulmonic valve is structurally normal. There is no significant pulmonic insufficiency. There is no evidence of pulmonary hypertension.  A small to moderate pericardial effusion is noted both anteriorly and posteriorly. There is organized material present in the apex  this most likely with is is within the pericardial sac. There is a 25-30% inspiratory variation in tricuspid flow velocities. This would suggest increased intrapericardial pressure. There is however no diastolic collapse of the right ventricle. Compared to the echocardiogram 07/20/2013 the effusion is slightly increased in size. The organized material was not present on the previous echocardiograms. Clinical correlation is required considering these echocardiographic changes.  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       08/01/2013  impression: 1. Atrial septal aneurysm is present. 2. There is a small to moderate pericardial effusion noted both anteriorly and posteriorly.  this is only slightly increased from the previous echocardiogram dated 07/20/2013. Variation of tricuspid flow velocity is noted. There is no diastolic collapse of the right ventricle noted. There is organized material note noted in the apex of the right ventricle not previously reported. Small small pleural effusion is present. Clinical correlation is required considering these echocardiographic changes. 3. There are no other significant echogram echocardiographic changes noted.  Lennon Alstrom, MD  08/01/2013 2:03 PM     Echocardiogram Adult Complete W Color Doppler Waveform    07/20/2013  ECHOCARDIOGRAM  SONOGRAPHER:  Tilda Burrow, RDCS  PATIENT HEIGHT:  63         PATIENT WEIGHT:  180      BP: 134/61   MEASUREMENTS      NORMAL RANGE Left Atrium       37  (15-40 mm)    Aortic Root        28  (20-37 mm) LV (Diastole)     41  (37-56 mm) LV (Systole)      24  (20-40 mm) LVPW (Diastole)      11  ( 6-11  mm)   IVS (Diastole)      11  ( 6-11  mm) Right Ventricle      20  ( 7-23  mm)       PROCEDURE: M-mode, 2D, spectral Doppler and color flow echocardiographic imaging was performed and interpreted.  FINDINGS:  The quality of the study is technically adequate for interpretation.  The left ventricle is normal in size. Left ventricular systolic  function is normal. There are no regional wall motion abnormalities. Estimated  EF is 60 %.  There is no left ventricular hypertrophy.    The left atrium is normal in size.  The aortic valve is trileaflet.  Normal valve excursion is present. There is no significant aortic insufficiency. No aortic stenosis is present. The aortic root is normal in size.  The mitral valve is structurally normal. No significant mitral insufficiency is present.  The right ventricle is normal in size and function.  The right atrium is normal in size.  The tricuspid valve is structurally normal. There is no significant tricuspid insufficiency.  The pulmonic valve is not well seen. There is no significant pulmonic insufficiency. There is no evidence of pulmonary hypertension.  There is a small pericardial effusion with a maximum free space of 1 cm anteriorly. There is no evidence of cardiac tamponade.  The atrial septum is mobile. No shunt by color flow Doppler.       07/20/2013   Normal left ventricular systolic function. No significant valvular heart disease. Small pericardial effusion without evidence of cardiac tamponade.    Audria Nine, MD  07/20/2013 4:09 PM     Ct Abdomen Pelvis Wo Iv/ Wo Po Cont    07/20/2013  Reason for exam: 67 year old female with pain.  TECHNIQUE: Spiral CT without contrast. Exam compared with views of the lower chest and upper abdomen from chest CT scan one day earlier.  FINDINGS: In the visualized lower chest, there has been interval development of a minimal to moderate pericardial effusion. Trace pleural effusions have also developed. There is persistent bibasilar atelectasis.  In the abdomen, no detectable abnormalities involve the liver or spleen. The pancreas is mildly atrophic. Adrenal glands are normal. Contrast residue from previous CT scan is present in the gallbladder and kidneys. The kidneys demonstrate numerous small low-attenuation lesions. There is scattered scarring in the kidneys. There is no  hydronephrosis. The intestine reveals no thickening, distention, or perceived inflammation. There is no obstruction or free air. There is no localizing lymphadenopathy or ascites.  In the pelvis, uterus is surgically absent. Bladder is distended with contrast residue.  Arthritis is seen in the spine.      07/20/2013   1. Interval development of a minimal to moderate pericardial effusion and trace pleural effusions since prior exam one day earlier. 2. No detectable acute abnormalities in the abdomen or pelvis. 3. Contrast residue gallbladder, kidneys, and bladder. 4. Numerous small low-attenuation kidney lesions. 5. Remainder as above. 6. Findings discussed with Dr. Beryle Lathe by Dr. Oren Binet at 3:59 AM.  Darnell Level  Delphia Grates, MD  07/20/2013 8:23 AM     Xr Chest 2 Views    08/10/2013  CLINICAL HISTORY: Evaluate for pulmonary edema.  FINDINGS: PA and lateral views of the chest. Comparison made to portable chest 08/07/2013.  Small bilateral pleural effusions and basilar atelectasis, decreased from prior. No overt vascular congestion. Cardiac silhouette is partially obscured but grossly stable. Degenerative change in the spine.      08/10/2013   Small pleural effusions and basilar atelectasis, decreased from prior.  Loel Ro, MD  08/10/2013 10:22 AM     Ct Angio Chest    07/19/2013  Reason for exam: 67 year old female with chest pain. Concern for pulmonary embolism.  TECHNIQUE: Spiral CT angiography after IV contrast. 95 cc Visipaque administered. MIPS generated.  FINDINGS: There are no detectable pulmonary emboli. The pulmonary arteries are normal caliber.  The heart is minimally enlarged. Aorta reveals no aneurysm or dissection. There is no pericardial effusion.  The lungs reveal minimal bibasilar atelectasis. No infiltrates are seen. There are no pleural effusions. There is no pneumothorax.  There is mild arthritis in the spine. Thyroid gland is minimally enlarged.      07/19/2013   1. No CT scan evidence for detectable pulmonary  embolism. 2. Minimal cardiomegaly. 3. Minimal bibasilar lung atelectasis. 4. Remainder as above.  Sheria Lang, MD  07/19/2013 3:18 PM     Xr Chest Ap Portable    08/07/2013  HISTORY: Chest pain  COMPARISON: 07/30/2013  FINDINGS: Small right pleural effusion and moderate left pleural effusion, not significant changed. Minimal cardiomegaly. Atelectasis and/or consolidation bilateral lung bases. No evidence of pneumothorax.      08/07/2013   Stable exam with small right pleural effusion and moderate left pleural effusion.  Loel Lofty. Anna Genre, MD  08/07/2013 9:38 AM     Chest Ap Portable    07/30/2013  Clinical history: Chest pain.  Portable AP chest was obtained. Comparison made to prior study dated 07/19/2013.  FINDINGS:  There is stable cardiomegaly. There is interval development of bilateral pleural effusions with bibasilar airspace opacities. There are low lung volumes. No pneumothorax is seen.      07/30/2013    1. New bilateral pleural effusions with bibasilar airspace opacities. 2. Stable cardiomegaly.  Darin Engels, MD  07/30/2013 4:33 PM     Xr Chest Ap Portable    07/19/2013  Reason for exam: 67 year old female with chest pain.  FINDINGS: Portable AP view. The lungs are hypoinflated with minimal basilar atelectasis. The heart is top normal size. There is no pneumothorax.      07/19/2013   Hypoinflation with minimal basilar atelectasis.  Sheria Lang, MD  07/19/2013 2:45 PM       Current Medications:     Current Facility-Administered Medications   Medication Dose Route Frequency   . amLODIPine  5 mg Oral Daily   . aspirin  81 mg Oral Daily   . atorvastatin  10 mg Oral Daily   . b complex-vitamin c-folic acid  1 tablet Oral Daily   . bisacodyl  5 mg Oral Daily   . colchicine  0.6 mg Oral Daily   . [COMPLETED] darbepoitin alfa (ARANESP) injection  100 mcg Subcutaneous Once   . gabapentin  300 mg Oral TID   . insulin glargine  12 Units Subcutaneous QHS   . pantoprazole  40 mg Oral QAM AC   . pregabalin  75 mg Oral Q12H [facility]  Tawni Pummel ZhengMD

## 2013-08-10 NOTE — Progress Notes (Signed)
Mary [name] is a 67 y.o. female patient with PMH notable for  DM, HTN, ESRD on hemodialysis, admitted because of SOB.    Active Problems:   Chest pain    Past Medical History   Diagnosis Date   . Diabetes mellitus without complication    . Hyperlipidemia    . Hypertensive disorder    . Chronic kidney disease      dialysis t-tr-sat   . Arthritis    . Cataracts, bilateral    . GERD (gastroesophageal reflux disease)    . Acute deep vein thrombosis (DVT) of distal vein of left lower extremity    . Hemodialysis access site with mature fistula      Current Facility-Administered Medications   Medication Dose Route Frequency Provider Last Rate Last Dose   . acetaminophen (TYLENOL) tablet 650 mg  650 mg Oral Q6H PRN Laroy Apple, MD       . amLODIPine (NORVASC) tablet 5 mg  5 mg Oral Daily Laroy Apple, MD   5 mg at 08/10/13 1248   . aspirin chewable tablet 81 mg  81 mg Oral Daily Laroy Apple, MD   81 mg at 08/10/13 1249   . atorvastatin (LIPITOR) tablet 10 mg  10 mg Oral Daily Laroy Apple, MD   10 mg at 08/09/13 2159   . b complex-vitamin c-folic acid (NEPHRO-VITE) tablet 1 tablet  1 tablet Oral Daily Laroy Apple, MD   1 tablet at 08/10/13 1247   . bisacodyl (DULCOLAX) EC tablet 5 mg  5 mg Oral Daily Noralyn Pick, MD   5 mg at 08/10/13 1250   . colchicine tablet 0.6 mg  0.6 mg Oral Daily Laroy Apple, MD   0.6 mg at 08/10/13 1247   . [COMPLETED] darbepoetin alfa-polysorbate (ARANESP) injection 100 mcg  100 mcg Subcutaneous Once Alric Quan, MD   100 mcg at 08/09/13 2200   . dextrose (GLUCOSE) 40 % oral gel 15 g  15 g Oral PRN Laroy Apple, MD       . dextrose 50 % bolus 25 mL  25 mL Intravenous PRN Laroy Apple, MD       . gabapentin (NEURONTIN) capsule 300 mg  300 mg Oral TID Laroy Apple, MD   300 mg at 08/10/13 1249   . glucagon (rDNA) (GLUCAGEN) injection 1 mg  1 mg Intramuscular PRN Laroy Apple, MD        . [EXPIRED] heparin (porcine) injection 1,000 Units  1,000 Units Intracatheter PRN Alric Quan, MD       . heparin (porcine) injection 3,000 Units  3,000 Units Intravenous PRN Noralyn Pick, MD   3,000 Units at 08/08/13 2352   . heparin 25000 units in dextrose 5% 500 mL infusion (premix)  1,000 Units/hr Intravenous Continuous Noralyn Pick, MD 17 mL/hr at 08/10/13 1246 850 Units/hr at 08/10/13 1246   . insulin glargine (LANTUS) injection 12 Units  12 Units Subcutaneous QHS Laroy Apple, MD   12 Units at 08/09/13 2158   . insulin regular (HumuLIN R,NovoLIN R) injection 1-8 Units  1-8 Units Subcutaneous TID AC PRN Laroy Apple, MD   3 Units at 08/10/13 1324   . lidocaine (LMX PLUS 4%) 4 % dressing   Topical PRN Alric Quan, MD   1 each at 08/09/13 1400   . lidocaine (LMX PLUS 4%) 4 % dressing   Topical PRN Alric Quan, MD       .  pantoprazole (PROTONIX) EC tablet 40 mg  40 mg Oral QAM AC Laroy Apple, MD   40 mg at 08/10/13 0855   . pregabalin (LYRICA) capsule 75 mg  75 mg Oral Q12H [facility] Laroy Apple, MD   75 mg at 08/10/13 1249   . simethicone (MYLICON) chewable tablet 80 mg  80 mg Oral Q6H PRN Noralyn Pick, MD   80 mg at 08/09/13 2309   . [EXPIRED] sodium chloride 0.9 % bolus 100 mL  100 mL Other Q1H PRN Alric Quan, MD       . [EXPIRED] sodium chloride 0.9 % bolus 250 mL  250 mL Intravenous PRN Alric Quan, MD       . traMADol Veatrice Bourbon) tablet 50 mg  50 mg Oral Q6H PRN Laroy Apple, MD   50 mg at 08/10/13 [id number]     Allergies   Allergen Reactions   . Penicillins Hives   . Shrimp (Shellfish Allergy) Swelling     Blood pressure 112/68, pulse 89, temperature 97.5 F (36.4 C), temperature source Oral, resp. rate 17, height 1.626 m (5\' 4" ), weight 97.16 kg (214 lb 3.2 oz), SpO2 96.00%.    Subjective:  Symptoms:  She reports shortness of breath.    Diet:  Adequate intake.    Activity  level: Impaired due to weakness.      Objective:  General Appearance:  In no acute distress.    Vital signs: (most recent): Blood pressure 112/68, pulse 89, temperature 97.5 F (36.4 C), temperature source Oral, resp. rate 17, height 1.626 m (5\' 4" ), weight 97.16 kg (214 lb 3.2 oz), SpO2 96.00%.    Output: No urine output and producing stool.    Lungs:  There are decreased breath sounds.    Heart: Normal rate.    Neurological: Patient is alert.    Skin:  Warm.    Abdomen: Abdomen is soft.      Results     Procedure Component Value Units Date/Time    POCT glucose (AC and HS) [id number]  (Abnormal) Collected:08/10/13 1257     POCT Glucose WB 247 (A) mg/dL Updated:08/10/13 1257    APTT [id number]  (Abnormal) Collected:08/10/13 1143     PTT 49 (H) Updated:08/10/13 1225     APTT Anticoag. Given w/i 48 hrs. heparin     POCT glucose (AC and HS) [id number]  (Abnormal) Collected:08/10/13 0858     POCT Glucose WB 143 (A) mg/dL Updated:08/10/13 0858    POCT glucose (AC and HS) [id number]  (Abnormal) Collected:08/10/13 0542     POCT Glucose WB 219 (A) mg/dL Updated:08/10/13 0544    APTT [id number]  (Abnormal) Collected:08/10/13 0335     PTT 51 (H) Updated:08/10/13 0355     APTT Anticoag. Given w/i 48 hrs. heparin     CBC [id number]  (Abnormal) Collected:08/10/13 0335    Specimen Information:Blood / Blood Updated:08/10/13 0342     WBC 7.63      RBC 2.83 (L)      Hgb 8.3 (L) g/dL      Hematocrit 25.0 (L) %      MCV 88.3 fL      MCH 29.3 pg      MCHC 33.2 g/dL      RDW 14 %      Platelets 228      MPV 10.7 fL      Nucleated RBC 0     Narrative:    Per low intensity heparin protocol.  POCT glucose (AC and HS) [id number]  (Abnormal) Collected:08/09/13 2145     POCT Glucose WB 228 (A) mg/dL Updated:08/09/13 2159      Echocardiogram Adult Complete W Clr/ Dopp Waveform    08/01/2013  impression: 1. Atrial septal aneurysm is present. 2. There is a small to moderate pericardial effusion noted both anteriorly and posteriorly.   this is only slightly increased from the previous echocardiogram dated 07/20/2013. Variation of tricuspid flow velocity is noted. There is no diastolic collapse of the right ventricle noted. There is organized material note noted in the apex of the right ventricle not previously reported. Small small pleural effusion is present. Clinical correlation is required considering these echocardiographic changes. 3. There are no other significant echogram echocardiographic changes noted.  Lennon Alstrom, MD  08/01/2013 2:03 PM     Echocardiogram Adult Complete W Color Doppler Waveform    07/20/2013   Normal left ventricular systolic function. No significant valvular heart disease. Small pericardial effusion without evidence of cardiac tamponade.    Audria Nine, MD  07/20/2013 4:09 PM     Ct Abdomen Pelvis Wo Iv/ Wo Po Cont    07/20/2013   1. Interval development of a minimal to moderate pericardial effusion and trace pleural effusions since prior exam one day earlier. 2. No detectable acute abnormalities in the abdomen or pelvis. 3. Contrast residue gallbladder, kidneys, and bladder. 4. Numerous small low-attenuation kidney lesions. 5. Remainder as above. 6. Findings discussed with Dr. Beryle Lathe by Dr. Oren Binet at 3:59 AM.  Sheria Lang, MD  07/20/2013 8:23 AM     Xr Chest 2 Views    08/10/2013   Small pleural effusions and basilar atelectasis, decreased from prior.  Loel Ro, MD  08/10/2013 10:22 AM     Ct Angio Chest    07/19/2013   1. No CT scan evidence for detectable pulmonary embolism. 2. Minimal cardiomegaly. 3. Minimal bibasilar lung atelectasis. 4. Remainder as above.  Sheria Lang, MD  07/19/2013 3:18 PM     Xr Chest Ap Portable    08/07/2013   Stable exam with small right pleural effusion and moderate left pleural effusion.  Loel Lofty. Anna Genre, MD  08/07/2013 9:38 AM     Chest Ap Portable    07/30/2013    1. New bilateral pleural effusions with bibasilar airspace opacities. 2. Stable cardiomegaly.  Darin Engels, MD  07/30/2013  4:33 PM     Xr Chest Ap Portable    07/19/2013   Hypoinflation with minimal basilar atelectasis.  Sheria Lang, MD  07/19/2013 2:45 PM     Assessment:  (1.chest pain  2.pleural effusion  3.viral pericarditis  4.ESRD  5.DM  6.MGUS  7.HTN  8.anemia).       Plan:   (1.Hemodialysis with UF scheduled for AM   2.aranesp sq q weekly  3.monitor serum electrolytes).       Alric Quan  08/10/2013

## 2013-08-10 NOTE — Progress Notes (Signed)
PTT=49. Heparin gtt increased by 50units per heparin protocol. Heparin gtt now infusing at 850units/hr. Dr. Markus Jarvis at bedside speaking to pt. Plan to start coumadin po tonight and continue heparin gtt. Lorelee New

## 2013-08-10 NOTE — Plan of Care (Signed)
Problem: Renal Failure  Goal: Fluid and electrolyte balance are achieved/maintained  Outcome: Progressing  Pt had f/u chest xray today which showed pleural effusion improved. Plan to continue colchicine for 7 days dor recent pericarditis per attending. HD tomorrow. Resuming coumadin tonight, labs in am. Continue Heparin gtt as ordered. FS ac/hs. Pt rec'ing toradol prn for c/o left pleuritic pain. Lorelee New

## 2013-08-10 NOTE — Progress Notes (Addendum)
[facility] Progress Note      Date Time: 08/10/2013 10:19 AM    Pulmonary Attending addendum:   Pt.seen and examined and agree with NP lungs are diminished, labs and xrays reviewed and questions answered     Mary Mccurdy MD    24 hr Events/Subjective:   Had HD - 2.1 L off  No SOB but now has left chest pleuritic pain  sats 96% RA  CXR this am much improved with ess effusions and better inspiration    Assessment:     1. Pleuritic chest pain with bilateral pleural effusions & atelectasis associated with hypoventilation - improved on CXR today  2. Recent viral pericarditis  3. Diastolic dysfunction  4. ESRD on HD  5. LLE DVT - on coumadin: [place] 05/2013 with subtherapeutic INR on presentation  6. DM  7. HTN  8. Anemia    Plan:   1. Home from a pulmonary standpoint, may follow up in office with Dr Nechama Guard in two weeks  2. Continue walking and use IS10 times every hour  3. DVT prophylaxis = on heparin drip  4. Code status = full      Medications:      Scheduled Meds: PRN Meds:           amLODIPine 5 mg Oral Daily   aspirin 81 mg Oral Daily   atorvastatin 10 mg Oral Daily   b complex-vitamin c-folic acid 1 tablet Oral Daily   bisacodyl 5 mg Oral Daily   colchicine 0.6 mg Oral Daily   [COMPLETED] darbepoitin alfa (ARANESP) injection 100 mcg Subcutaneous Once   gabapentin 300 mg Oral TID   insulin glargine 12 Units Subcutaneous QHS   pantoprazole 40 mg Oral QAM AC   pregabalin 75 mg Oral Q12H [place]       Continuous Infusions:       . heparin 25000 units in dextrose 5% 500 mL 800 Units/hr (08/10/13 0529)         acetaminophen 650 mg Q6H PRN   dextrose 15 g PRN   dextrose 25 mL PRN   glucagon (rDNA) 1 mg PRN   [EXPIRED] heparin (porcine) 1,000 Units PRN   heparin (porcine) 3,000 Units PRN   insulin regular 1-8 Units TID AC PRN   lidocaine  PRN   lidocaine  PRN   simethicone 80 mg Q6H PRN   [EXPIRED] sodium chloride 100 mL Q1H PRN   [EXPIRED] sodium chloride 250 mL PRN   traMADol 50 mg Q6H PRN           Physical Exam:     Filed Vitals:     08/09/13 2243 08/10/13 0519 08/10/13 0744 08/10/13 0852   BP: 114/53 127/65  104/72   Pulse: 88 94  99   Temp: 98.6 F (37 C) 95.7 F (35.4 C)  96.5 F (35.8 C)   TempSrc: Oral Axillary  Oral   Resp: 18 18  18    Height:       Weight:   97.16 kg (214 lb 3.2 oz)    SpO2: 98% 92%  96%     Temp (24hrs), Avg:96.9 F (36.1 C), Min:95.7 F (35.4 C), Max:98.6 F (37 C)    General Appearance:  Breathing comfortably, no acute distress  Lungs:    diminished bases, crackles RLL  Heart:    regular rhythm,  no murmur   Abdomen:   soft, non-tender  Extremities:   RLE >LLE  edema  Neurologic:  Alert and oriented, moves all extremities  equally      Intake/Output Summary (Last 24 hours) at 08/10/13 1019  Last data filed at 08/10/13 [id number]   Gross per 24 hour   Intake    973 ml   Output   2100 ml   Net  -1127 ml       Active PICC Line / CVC Line / PIV Line / Drain / Airway / Intraosseous Line / Epidural Line / ART Line / Line / Wound / Pressure Ulcer / NG/OG Tube     Name   Placement date   Placement time   Site   Days    Peripheral IV 08/07/13 Right Other (Comment)  08/07/13      Other (Comment)   2    Graft/Fistula Left          --          Labs:       Lab 08/09/13 1530 08/08/13 0645   GLU 325* 168*        Lab 08/10/13 0335 08/09/13 1530 08/09/13 0627   WBC 7.63 8.42 7.89   RBC 2.83* 2.83* 2.72*   HGB 8.3* 8.3* 7.9*   HCT 25.0* 25.0* 24.1*   MCV 88.3 88.3 88.6   MCHC 33.2 33.2 32.8   RDW 14 14 14    MPV 10.7 11.3 10.6   PLT 228 304 273       Lab 08/09/13 1530 08/08/13 0645   ALT -- --   AST -- --   GLOB -- --   ALB 2.5* 2.3*       Lab 08/08/13 0127 08/07/13 1928 08/07/13 1016   CK 27* 31 34   TROPI <0.01 <0.01 <0.01   TROPT -- -- --   CKMBINDEX -- -- --       Lab 08/09/13 1530   BILITOTAL --   BILIDIRECT --   PROT --   ALB 2.5*   ALT --   AST --       Lab 08/10/13 0335 08/09/13 0627   PT -- 14.4   INR -- 1.1   PTT 51* --       Lab 08/09/13 1530 08/08/13 0645   NA 131* 138   K 4.3 4.2   CL 96* 100   CO2 23 29   BUN 39* 22*   CREAT  5.1* 4.1*   CA 8.7 8.7   ALB 2.5* 2.3*   PROT -- --   BILITOTAL -- --   ALKPHOS -- --   ALT -- --   AST -- --   GLU 325* 168*       Microbiology:     Rads:   Radiological Procedure reviewed    8/17 CXR   Stable exam with small right pleural effusion and moderate left pleural  effusion.    Signed:  Chyrl Civatte NP  [facility]

## 2013-08-10 NOTE — Progress Notes (Signed)
Report rec'd from [place], [place]. Pt assessed as documented, pt A&OX4, pleasant, denies sob or pain at this time. Pt resting in bed, poc reviewed, pt denies needs at this time. Call bell within reach. Pt states she is supposed to have f/u chest xray today per md yesterday, will speak to md. Mary [name]

## 2013-08-11 LAB — GFR: EGFR: 11.4

## 2013-08-11 LAB — RENAL FUNCTION PANEL
Albumin: 2.4 g/dL — ABNORMAL LOW (ref 3.5–5.0)
Anion Gap: 10 (ref 5.0–15.0)
BUN: 31 mg/dL — ABNORMAL HIGH (ref 7–19)
CO2: 27 (ref 22–29)
Calcium: 9 mg/dL (ref 8.5–10.5)
Chloride: 100 (ref 98–107)
Creatinine: 4.6 mg/dL — ABNORMAL HIGH (ref 0.6–1.0)
Glucose: 133 mg/dL — ABNORMAL HIGH (ref 70–100)
Phosphorus: 4.6 mg/dL (ref 2.3–4.7)
Potassium: 4.1 (ref 3.5–5.1)
Sodium: 137 (ref 136–145)

## 2013-08-11 LAB — CBC
Hematocrit: 23.3 % — ABNORMAL LOW (ref 37.0–47.0)
Hgb: 7.8 g/dL — ABNORMAL LOW (ref 12.0–16.0)
MCH: 29.5 pg (ref 28.0–32.0)
MCHC: 33.5 g/dL (ref 32.0–36.0)
MCV: 88.3 fL (ref 80.0–100.0)
MPV: 10.9 fL (ref 9.4–12.3)
Nucleated RBC: 0 (ref 0–1)
Platelets: 247 (ref 140–400)
RBC: 2.64 — ABNORMAL LOW (ref 4.20–5.40)
RDW: 14 % (ref 12–15)
WBC: 7.84 (ref 3.50–10.80)

## 2013-08-11 LAB — POCT GLUCOSE
Whole Blood Glucose POCT: 174 mg/dL — AB (ref 70–100)
Whole Blood Glucose POCT: 286 mg/dL — AB (ref 70–100)
Whole Blood Glucose POCT: 138 mg/dL — AB (ref 70–100)
Whole Blood Glucose POCT: 247 mg/dL — AB (ref 70–100)

## 2013-08-11 LAB — APTT
PTT: 67 — ABNORMAL HIGH (ref 23–37)
PTT: 64 — ABNORMAL HIGH (ref 23–37)

## 2013-08-11 MED ORDER — SODIUM CHLORIDE 0.9 % IV BOLUS
250.0000 mL | INTRAVENOUS | Status: AC | PRN
Start: 2013-08-11 — End: 2013-08-11

## 2013-08-11 MED ORDER — LIDOCAINE 5 % EX PTCH
1.0000 | MEDICATED_PATCH | CUTANEOUS | Status: DC
Start: 2013-08-11 — End: 2013-08-19
  Administered 2013-08-11 – 2013-08-19 (×6): 1 via TRANSDERMAL
  Filled 2013-08-11 (×6): qty 1

## 2013-08-11 MED ORDER — WARFARIN SODIUM 5 MG PO TABS
5.0000 mg | ORAL_TABLET | Freq: Every day | ORAL | Status: DC
Start: 2013-08-11 — End: 2013-08-12
  Administered 2013-08-11: 5 mg via ORAL
  Filled 2013-08-11: qty 1

## 2013-08-11 MED ORDER — SODIUM CHLORIDE 0.9 % IV BOLUS
100.0000 mL | INTRAVENOUS | Status: AC | PRN
Start: 2013-08-11 — End: 2013-08-11

## 2013-08-11 NOTE — Progress Notes (Signed)
Mary [name] is a 67 y.o. female patient.  Active Problems:   Chest pain    Past Medical History   Diagnosis Date   . Diabetes mellitus without complication    . Hyperlipidemia    . Hypertensive disorder    . Chronic kidney disease      dialysis t-tr-sat   . Arthritis    . Cataracts, bilateral    . GERD (gastroesophageal reflux disease)    . Acute deep vein thrombosis (DVT) of distal vein of left lower extremity    . Hemodialysis access site with mature fistula      Current Facility-Administered Medications   Medication Dose Route Frequency Provider Last Rate Last Dose   . acetaminophen (TYLENOL) tablet 650 mg  650 mg Oral Q6H PRN Laroy Apple, MD       . amLODIPine (NORVASC) tablet 5 mg  5 mg Oral Daily Laroy Apple, MD   5 mg at 08/10/13 1248   . aspirin chewable tablet 81 mg  81 mg Oral Daily Laroy Apple, MD   81 mg at 08/11/13 1017   . atorvastatin (LIPITOR) tablet 10 mg  10 mg Oral Daily Laroy Apple, MD   10 mg at 08/10/13 2216   . b complex-vitamin c-folic acid (NEPHRO-VITE) tablet 1 tablet  1 tablet Oral Daily Laroy Apple, MD   1 tablet at 08/11/13 1016   . bisacodyl (DULCOLAX) EC tablet 5 mg  5 mg Oral Daily Noralyn Pick, MD   5 mg at 08/11/13 1017   . colchicine tablet 0.6 mg  0.6 mg Oral Daily Laroy Apple, MD   0.6 mg at 08/11/13 1016   . dextrose (GLUCOSE) 40 % oral gel 15 g  15 g Oral PRN Laroy Apple, MD       . dextrose 50 % bolus 25 mL  25 mL Intravenous PRN Laroy Apple, MD       . gabapentin (NEURONTIN) capsule 300 mg  300 mg Oral TID Laroy Apple, MD   300 mg at 08/11/13 1017   . glucagon (rDNA) (GLUCAGEN) injection 1 mg  1 mg Intramuscular PRN Laroy Apple, MD       . heparin (porcine) injection 3,000 Units  3,000 Units Intravenous PRN Noralyn Pick, MD   3,000 Units at 08/08/13 2352   . heparin 25000 units in dextrose 5% 500 mL infusion (premix)  1,000 Units/hr  Intravenous Continuous Noralyn Pick, MD 20 mL/hr at 08/11/13 0750 1,000 Units/hr at 08/11/13 0750   . insulin glargine (LANTUS) injection 12 Units  12 Units Subcutaneous QHS Laroy Apple, MD   12 Units at 08/10/13 2217   . insulin regular (HumuLIN R,NovoLIN R) injection 1-8 Units  1-8 Units Subcutaneous TID AC PRN Laroy Apple, MD   5 Units at 08/11/13 1241   . lidocaine (LIDODERM) 5 % 1 patch  1 patch Transdermal Q24H [place]in [place], Doxie Augenstein A, MD       . lidocaine (LMX PLUS 4%) 4 % dressing   Topical PRN Alric Quan, MD   1 each at 08/09/13 1400   . lidocaine (LMX PLUS 4%) 4 % dressing   Topical PRN Alric Quan, MD       . pantoprazole (PROTONIX) EC tablet 40 mg  40 mg Oral QAM AC Laroy Apple, MD   40 mg at 08/11/13 1016   . pregabalin (LYRICA) capsule 75 mg  75 mg  Oral Q12H [facility] Laroy Apple, MD   75 mg at 08/11/13 1017   . simethicone (MYLICON) chewable tablet 80 mg  80 mg Oral Q6H PRN Noralyn Pick, MD   80 mg at 08/09/13 2309   . traMADol (ULTRAM) tablet 50 mg  50 mg Oral Q6H PRN Laroy Apple, MD   50 mg at 08/11/13 1025   . [COMPLETED] warfarin (COUMADIN) tablet 7.5 mg  7.5 mg Oral Once Michelene Heady, MD   7.5 mg at 08/10/13 1837     Allergies   Allergen Reactions   . Penicillins Hives   . Shrimp (Shellfish Allergy) Swelling     Blood pressure 107/73, pulse 103, temperature 98 F (36.7 C), temperature source Oral, resp. rate 20, height 1.626 m (5\' 4" ), weight 97.16 kg (214 lb 3.2 oz), SpO2 98.00%.    Subjective:  Symptoms:  Stable.  (Patient complaint of pain in her side, lower back, that is reproducible, tot he touch).    Diet:  Adequate intake.    Activity level: Normal.    Pain:  She complains of pain that is moderate.  Pain is partially controlled.      Objective:  General Appearance:  Comfortable and in no acute distress.    Vital signs: (most recent): Blood pressure 107/73, pulse 103, temperature 98 F (36.7 C),  temperature source Oral, resp. rate 20, height 1.626 m (5\' 4" ), weight 97.16 kg (214 lb 3.2 oz), SpO2 98.00%.  Vital signs are normal.    HEENT: Normal HEENT exam.    Lungs:  Normal effort.    Heart: Normal rate.  S1 normal and S2 normal.    Extremities: Normal range of motion.    Neurological: Patient is alert and oriented to person, place and time.    Skin:  Warm.    Abdomen: Abdomen is soft.    Results for FAUNA, DEISHER (MRN [id number]) as of 08/11/2013 12:57   Ref. Range 08/10/2013 03:35 08/11/2013 03:03   WBC Latest Range: 3.50-10.80  7.63 7.84   Hemoglobin Latest Range: 12.0-16.0 g/dL 8.3 (L) 7.8 (L)   Hematocrit Latest Range: 37.0-47.0 % 25.0 (L) 23.3 (L)   Platelet Count Latest Range: 140-400  228 247   RBC Latest Range: 4.20-5.40  2.83 (L) 2.64 (L)   MCV Latest Range: 80.0-100.0 fL 88.3 88.3   MCH, POC Latest Range: 28.0-32.0 pg 29.3 29.5   MCHC Latest Range: 32.0-36.0 g/dL 33.2 33.5   RDW Latest Range: 12-15 % 14 14   MPV Latest Range: 9.4-12.3 fL 10.7 10.9   Results for CHERINE, PIERMAN (MRN [id number]) as of 08/11/2013 12:57   Ref. Range 08/11/2013 06:34 08/11/2013 08:34   Glucose Latest Range: 70-100 mg/dL  133 (H)   POCT Glucose WB Latest Range: 70-100 mg/dL 174 (A)    BUN Latest Range: 7-19 mg/dL  31 (H)   Creatinine Latest Range: 0.6-1.0 mg/dL  4.6 (H)   Sodium Latest Range: 136-145   137   Potassium Latest Range: 3.5-5.1   4.1   Chloride Latest Range: 98-107   100   CO2 Latest Range: 22-29   27   Anion Gap Latest Range: 5.0-15.0   10.0   Calcium Latest Range: 8.5-10.5 mg/dL  9.0   Phosphorus Latest Range: 2.3-4.7 mg/dL  4.6   EGFR No range found  11.4   Albumin Latest Range: 3.5-5.0 g/dL  2.4 (L)     Assessment:    Condition: In stable condition.  Improving.   (Lower  Back Pain  Hx of DVT  Hx of Viral Pericarditis  CHF: Diastolic  Dm  Hypertension  HPL  ).       Plan:   Transfer to floor.  (Chest pain. Stable, will continue supportive care.  Hx of DVT. Patient now in IV heparin drip, will star her coumadin  and home once the INR is  2 - 3  ESRD on HD stable  Hypertension stable with current medication treatment.).       [place]  08/11/2013

## 2013-08-11 NOTE — Plan of Care (Signed)
Heparin in progress and  aptt monitored . Dialysis in progress

## 2013-08-11 NOTE — Progress Notes (Signed)
Dialysis Treatment Note    Patient:  Mary [name] MRN#:  [id number]  Unit/Room/Bed:  [id number]   Isolation: None       Allergies:   Allergies   Allergen Reactions   . Penicillins Hives   . Shrimp (Shellfish Allergy) Swelling     Code Status: Full Code    Problem List:   Patient Active Problem List    Diagnosis Date Noted   . Dyspnea 07/30/2013   . Chest pain 07/30/2013   . Pleural effusion 07/30/2013   . Viral pericarditis 07/21/2013   . Chest pain at rest 07/19/2013   . Cellulitis of left leg 06/21/2013   . ESRD (end stage renal disease) 06/21/2013   . DM (diabetes mellitus) 06/21/2013   . Hypertension 06/21/2013   . Cellulitis 06/21/2013       Dialysis Order:  Active Orders   Dialysis    Hemodialysis inpatient     Frequency: Once     Start Date/Time: 08/11/13 1419     Number of Occurrences:  1 Occurrences     Order Questions:     . Date of Dialysis 08/11/2013     . K+ (Initial) 2 mEq     . KPP (Per Protocol) Yes     . Ca++ 2.5 mEq     . Bicarb 35 mEq     . Na+ 138 mEq     . Dialyzer F160     . Dialysate Temperature (C) 37     . BFR-As tolerated to a maximum of: 350 mL/min     . DFR 700 mL/min     . Duration of Treatment 3.5 Hours     . Fluid Removal (L) and Dry Weight (Kg) Other (please specify) (Comment: 1.5-2)     . Access Type-Location AVF-L     . If available, Nurse may use Crit Line to guide U/F rate Yes        Dialysis Treatment Type:    Time out/Safety Check: Time Out/Safety Check Completed: Yes (08/11/13 1419)  [facility] Consent: Consent for HD signed for this hospitalization: Yes (08/11/13 1419)  Blood Consent (if applicable): Blood Consent Verified: Not Applicable ([id number])    Dialysis Access:      Graft/Fistula Left (Active)   Fistula/ Graft Assessment Abnormalities WDL 08/11/2013  6:03 PM   Needle Size 15 Gauge 08/11/2013  6:03 PM   Cannulation Sites held Arterial (min) 5 min 08/11/2013  6:03 PM   Cannulation Sites held Venous (min) 5 min 08/11/2013  6:03 PM   Hemostasis Achieved Yes  08/11/2013  6:03 PM       General Assessments:     08/11/13 1803   Treatment Summary   Time Off Machine 1800   Duration of Treatment (Hours) 3.5   Dialyzer Clearance Moderately streaked   Fluid Volume Off (mL) 2600    Prime Volume (mL) 200    Rinseback Volume (mL) 200    Fluid Given: Normal Saline (mL) 200    Fluid Given: PRBC  0 mL   Fluid Given: Albumin (mL) 0    Fluid Given: Other (mL) 0    Total Fluid Given 600    Hemodialysis Net Fluid Removed 2000    Post Treatment Assessment   Patient Response to Treatment tolerated well   Graft/Fistula Left   No Placement Date or Time found.   Present on Admission?: Yes  Orientation: Left  Graft/Fistula Location: Upper Arm   Fistula/ Graft Assessment Abnormalities WDL  Needle Size 15 Gauge   Cannulation Sites held Arterial (min) 5 min   Cannulation Sites held Venous (min) 5 min   Hemostasis Achieved Yes   Vitals   Heart Rate 90    BP 139/66 mmHg   Assessment   Mental Status Alert;Oriented   Cardiac (WDL) WDL   Respiratory  WDL   Edema  X   Generalized Edema +1   LLE Edema +1   General Skin Color Appropriate for ethnicity   Skin Condition/Temp Warm;Dry   Education   Person taught Patient   Knowledge basis Minimal   Topics taught Fluid Management   Teaching Tools Explain   Bedside Nurse Communication   Name of bedside RN - post dialysis Kern Reap, RN      08/11/13 1800   Vitals   Temp 97.3 F (36.3 C)   Heart Rate 91    BP 133/68 mmHg   Machine Metrics   Blood Flow Rate (mL/min) 350 mL/min   Arterial Pressure (mmHg) -147 mmHg   Venous Pressure (mmHg) 104    Transmembrane Pressure (mmHg) 59 mmHg   Ultrafiltration Rate (mL/Hr) 650 mL/hr   Fluid Removal (ml) 2600 ml   Fluid Bolus (ml) 200 ml   Hemodialysis Comments   Arteriovenous Lines Secure Yes      08/11/13 1700 08/11/13 1730   Vitals   Heart Rate 91  87    BP 110/63 mmHg 136/74 mmHg   Machine Metrics   Blood Flow Rate (mL/min) 350 mL/min 350 mL/min   Arterial Pressure (mmHg) -140 mmHg -145 mmHg   Venous Pressure (mmHg) 103  104     Transmembrane Pressure (mmHg) 66 mmHg 64 mmHg   Ultrafiltration Rate (mL/Hr) 650 mL/hr 650 mL/hr   Fluid Removal (ml) 1920 ml 2280 ml   Fluid Bolus (ml) 0 ml 0 ml   Hemodialysis Comments   Arteriovenous Lines Secure Yes Yes      08/11/13 1530 08/11/13 1600 08/11/13 1630   Vitals   Heart Rate 99  98  99    BP 135/63 mmHg 110/78 mmHg 122/86 mmHg   Machine Metrics   Blood Flow Rate (mL/min) 350 mL/min 350 mL/min 350 mL/min   Arterial Pressure (mmHg) -140 mmHg -144 mmHg -143 mmHg   Venous Pressure (mmHg) 110  104  103    Transmembrane Pressure (mmHg) 82 mmHg 78 mmHg 72 mmHg   Ultrafiltration Rate (mL/Hr) 800 mL/hr 800 mL/hr 800 mL/hr   Fluid Removal (ml) 776 ml 1217 ml 1557 ml   Fluid Bolus (ml) 0 ml 0 ml 0 ml   Hemodialysis Comments   Arteriovenous Lines Secure Yes Yes Yes       08/11/13 1645   Vitals   Heart Rate 92    BP 96/45 mmHg   Machine Metrics   Blood Flow Rate (mL/min) 350 mL/min   Arterial Pressure (mmHg) -148 mmHg   Venous Pressure (mmHg) 100    Transmembrane Pressure (mmHg) 69 mmHg   Ultrafiltration Rate (mL/Hr) 800 mL/hr   Fluid Removal (ml) 1770 ml   Fluid Bolus (ml) 200 ml  (ns given fo BP support)   Hemodialysis Comments   Arteriovenous Lines Secure Yes      08/11/13 1430 08/11/13 1457 08/11/13 1500   Graft/Fistula Left   No Placement Date or Time found.   Present on Admission?: Yes  Orientation: Left  Graft/Fistula Location: Upper Arm   Fistula/ Graft Assessment Abnormalities WDL --  WDL   Needle Size 15 Gauge --  --  Vitals   Heart Rate --  92  --    BP --  136/70 mmHg --    Journalist, newspaper Yes --  --    Blood Flow Rate (mL/min) 350 mL/min 350 mL/min --    Arterial Pressure (mmHg) -110 mmHg -131 mmHg --    Venous Pressure (mmHg) 90  97  --    Dialysate Flow Rate (mL/min) 600 mL/min --  --    Transmembrane Pressure (mmHg) 90 mmHg 91 mmHg --    Ultrafiltration Rate (mL/Hr) 800 mL/hr 800 mL/hr --    Fluid Removal (ml) 0 ml 230 ml --    Fluid Bolus (ml) 200 ml 0  ml --    Dialysate K (mEq/L) 2 mEq/L --  --    Dialysate CA (mEq/L) 2.5 mEq/L --  --    Hemodialysis Comments   Arteriovenous Lines Secure Yes Yes --        08/11/13 1515   Graft/Fistula Left   No Placement Date or Time found.   Present on Admission?: Yes  Orientation: Left  Graft/Fistula Location: Upper Arm   Fistula/ Graft Assessment Abnormalities --    Needle Size --    Vitals   Heart Rate 98    BP 140/70 mmHg   Machine Metrics   $Treatment Started/Capturing Charge --    Blood Flow Rate (mL/min) 350 mL/min   Arterial Pressure (mmHg) -135 mmHg   Venous Pressure (mmHg) 100    Dialysate Flow Rate (mL/min) --    Transmembrane Pressure (mmHg) 89 mmHg   Ultrafiltration Rate (mL/Hr) 800 mL/hr   Fluid Removal (ml) 620 ml   Fluid Bolus (ml) 0 ml   Dialysate K (mEq/L) --    Dialysate CA (mEq/L) --    Hemodialysis Comments   Arteriovenous Lines Secure Yes      08/11/13 1500   Assessment   Cardiac (WDL) WDL   Cardiac Regularity Regular   Cardiac Symptoms None   Cardiac Rhythm Normal Sinus Rhythm   Respiratory Pattern Regular   Bilateral Breath Sounds Clear   R Breath Sounds Clear   L Breath Sounds Clear   Cough Non-productive   Generalized Edema None   Facial None   Sacral None   RLE Edema Non Pitting Edema   LLE Edema Non Pitting Edema   General Skin Color Appropriate for ethnicity   Skin Condition/Temp Warm;Dry   Gastrointestinal (WDL) WDL   Abdomen Inspection Soft   GI Symptoms None   Graft/Fistula Left   No Placement Date or Time found.   Present on Admission?: Yes  Orientation: Left  Graft/Fistula Location: Upper Arm   Fistula/ Graft Assessment Abnormalities WDL   Pain Assessment   Pain Scale Used Numeric Scale (0-10)   Numeric Pain Scale   Pain Score 0      08/11/13 1419   Bedside Nurse Communication   Name of bedside RN - pre dialysis Kern Reap, RN   Treatment Initiation- With Dialysis Precautions   Time Out/Safety Check Completed Yes   Consent for HD signed for this hospitalization Y   Dialysis Precautions All  Connections Secured   Dialysis Treatment Type Routine;Bedside   Is patient diabetic? Yes   RO/Hemodialysis Architectural technologist   Is Total Chlorine less than 0.1 ppm? Yes   Orignial Total Chlorine Testing Time 1415   At 4 Hour Total Chlorine Testing Time 1815   RO/Hemodialysis Archivist Number 2    RO #  2    Water Hardness 0    Pressure Test Verified Yes   Alarms Verified Passed   Machine Temperature 97.5 F (36.4 C)   Alarms Verified Yes   Hemodialysis Conductivity (Machine) 13.7    Dialyzer Lot Number [id number])   RO Machine Log Completed Yes   Hepatitis Status   HBsAg (Antigen) Result Negative   HBsAg Date Drawn 08/07/13   Vitals   Temp 98.7 F (37.1 C)   BP 138/90 mmHg   SpO2 95 %   O2 Device None (Room air)   Assessment   Mental Status Alert;Oriented           Pain Assessment: Pain Assessment  Charting Type: Assessment (08/10/13 1940)  Pain Scale Used: Numeric Scale (0-10) (08/11/13 1500)    Labs Values:    HBsAg Result:HBsAg (Antigen) Result: Negative (08/11/13 1419)  HBsAg Date Drawn: HBsAg Date Drawn: 08/07/13 (08/11/13 1419)  HBsAg Next Due Date:HBsAg Repeat Draw Due Date: 09/04/13 (08/09/13 1500)    Lab Results   Component Value Date    BUN 31* 08/11/2013    NA 137 08/11/2013    K 4.1 08/11/2013    CREAT 4.6* 08/11/2013    CO2 27 08/11/2013    CA 9.0 08/11/2013    PHOS 4.6 08/11/2013    GLU 133* 08/11/2013    ALB 2.4* 08/11/2013    HGB 7.8* 08/11/2013    HCT 23.3* 08/11/2013    WBC 7.84 08/11/2013    PLT 247 08/11/2013    PTT 64* 08/11/2013    PT 14.4 08/09/2013    INR 1.1 08/09/2013         Current Diet Order  Diet cardiac Fluid restriction:: 1500 ML FLUID; Na restriction, if any:: 2 GM NA     RO/Hemodialysis Machine Safety Checks - Before Each Treatment:  RO/Hemodialysis Archivist Number: 2  (08/11/13 1419)  RO #: 2  (08/11/13 1419)  Water Hardness: 0  (08/11/13 1419)  pH: 7.4  (08/09/13 1500)  Pressure Test Verified: Yes (08/11/13 1419)  Alarms Verified: Passed (08/11/13  1419)  Machine Temperature: 97.5 F (36.4 C) (08/11/13 1419)  Alarms Verified: Yes (08/11/13 1419)  Hemodialysis Conductivity (Machine): 13.7  (08/11/13 1419)  Hemodialysis Conductivity (Meter): 13.9  (08/09/13 1500)  RO Machine Log Completed: Yes (08/11/13 1419)    Chlorine Testing:  RO/Hemodialysis Machine Safety Checks  Is Total Chlorine less than 0.1 ppm?: Yes (08/11/13 1419)  Orignial Total Chlorine Testing Time: [id number] (08/11/13 1419)  At 4 Hour Total Chlorine Testing Time: [id number] (08/11/13 1419)    Vitals and Weight:  Patient Vitals for the past 4 hrs:   BP Temp Pulse   08/11/13 1803 139/66 mmHg - 90    08/11/13 1800 133/68 mmHg 97.3 F (36.3 C) 91    08/11/13 1730 136/74 mmHg - 87    08/11/13 1700 110/63 mmHg - 91    08/11/13 1645 96/45 mmHg - 92    08/11/13 1630 122/86 mmHg - 99    08/11/13 1600 110/78 mmHg - 98    08/11/13 1530 135/63 mmHg - 99    08/11/13 1515 140/70 mmHg - 98    08/11/13 1457 136/70 mmHg - 92         Dialysis Weight  Pre-Treatment Weight (Kg): 215.8  (08/09/13 1500)  Scale Type: ICU Bed Scale (08/09/13 1500)  Post-Treatment Weight (Kg): 213.7  (08/09/13 1845)    Treatment:   08/11/13 1803   Treatment Summary   Time  Off Machine 1800   Duration of Treatment (Hours) 3.5   Dialyzer Clearance Moderately streaked   Fluid Volume Off (mL) 2600    Prime Volume (mL) 200    Rinseback Volume (mL) 200    Fluid Given: Normal Saline (mL) 200    Fluid Given: PRBC  0 mL   Fluid Given: Albumin (mL) 0    Fluid Given: Other (mL) 0    Total Fluid Given 600    Hemodialysis Net Fluid Removed 2000    Post Treatment Assessment   Patient Response to Treatment tolerated well   Graft/Fistula Left   No Placement Date or Time found.   Present on Admission?: Yes  Orientation: Left  Graft/Fistula Location: Upper Arm   Fistula/ Graft Assessment Abnormalities WDL   Needle Size 15 Gauge   Cannulation Sites held Arterial (min) 5 min   Cannulation Sites held Venous (min) 5 min   Hemostasis Achieved Yes   Vitals   Heart Rate  90    BP 139/66 mmHg   Assessment   Mental Status Alert;Oriented   Cardiac (WDL) WDL   Respiratory  WDL   Edema  X   Generalized Edema +1   LLE Edema +1   General Skin Color Appropriate for ethnicity   Skin Condition/Temp Warm;Dry   Education   Person taught Patient   Knowledge basis Minimal   Topics taught Fluid Management   Teaching Tools Explain   Bedside Nurse Communication   Name of bedside RN - post dialysis Kern Reap, RN      08/11/13 1800   Vitals   Temp 97.3 F (36.3 C)   Heart Rate 91    BP 133/68 mmHg   Machine Metrics   Blood Flow Rate (mL/min) 350 mL/min   Arterial Pressure (mmHg) -147 mmHg   Venous Pressure (mmHg) 104    Transmembrane Pressure (mmHg) 59 mmHg   Ultrafiltration Rate (mL/Hr) 650 mL/hr   Fluid Removal (ml) 2600 ml   Fluid Bolus (ml) 200 ml   Hemodialysis Comments   Arteriovenous Lines Secure Yes      08/11/13 1700 08/11/13 1730   Vitals   Heart Rate 91  87    BP 110/63 mmHg 136/74 mmHg   Machine Metrics   Blood Flow Rate (mL/min) 350 mL/min 350 mL/min   Arterial Pressure (mmHg) -140 mmHg -145 mmHg   Venous Pressure (mmHg) 103  104    Transmembrane Pressure (mmHg) 66 mmHg 64 mmHg   Ultrafiltration Rate (mL/Hr) 650 mL/hr 650 mL/hr   Fluid Removal (ml) 1920 ml 2280 ml   Fluid Bolus (ml) 0 ml 0 ml   Hemodialysis Comments   Arteriovenous Lines Secure Yes Yes      08/11/13 1530 08/11/13 1600 08/11/13 1630   Vitals   Heart Rate 99  98  99    BP 135/63 mmHg 110/78 mmHg 122/86 mmHg   Machine Metrics   Blood Flow Rate (mL/min) 350 mL/min 350 mL/min 350 mL/min   Arterial Pressure (mmHg) -140 mmHg -144 mmHg -143 mmHg   Venous Pressure (mmHg) 110  104  103    Transmembrane Pressure (mmHg) 82 mmHg 78 mmHg 72 mmHg   Ultrafiltration Rate (mL/Hr) 800 mL/hr 800 mL/hr 800 mL/hr   Fluid Removal (ml) 776 ml 1217 ml 1557 ml   Fluid Bolus (ml) 0 ml 0 ml 0 ml   Hemodialysis Comments   Arteriovenous Lines Secure Yes Yes Yes       08/11/13 1645   Vitals  Heart Rate 92    BP 96/45 mmHg   Machine Metrics   Blood Flow  Rate (mL/min) 350 mL/min   Arterial Pressure (mmHg) -148 mmHg   Venous Pressure (mmHg) 100    Transmembrane Pressure (mmHg) 69 mmHg   Ultrafiltration Rate (mL/Hr) 800 mL/hr   Fluid Removal (ml) 1770 ml   Fluid Bolus (ml) 200 ml  (ns given fo BP support)   Hemodialysis Comments   Arteriovenous Lines Secure Yes      08/11/13 1430 08/11/13 1457 08/11/13 1500   Graft/Fistula Left   No Placement Date or Time found.   Present on Admission?: Yes  Orientation: Left  Graft/Fistula Location: Upper Arm   Fistula/ Graft Assessment Abnormalities WDL --  WDL   Needle Size 15 Gauge --  --    Vitals   Heart Rate --  92  --    BP --  136/70 mmHg --    Journalist, newspaper Yes --  --    Blood Flow Rate (mL/min) 350 mL/min 350 mL/min --    Arterial Pressure (mmHg) -110 mmHg -131 mmHg --    Venous Pressure (mmHg) 90  97  --    Dialysate Flow Rate (mL/min) 600 mL/min --  --    Transmembrane Pressure (mmHg) 90 mmHg 91 mmHg --    Ultrafiltration Rate (mL/Hr) 800 mL/hr 800 mL/hr --    Fluid Removal (ml) 0 ml 230 ml --    Fluid Bolus (ml) 200 ml 0 ml --    Dialysate K (mEq/L) 2 mEq/L --  --    Dialysate CA (mEq/L) 2.5 mEq/L --  --    Hemodialysis Comments   Arteriovenous Lines Secure Yes Yes --        08/11/13 1515   Graft/Fistula Left   No Placement Date or Time found.   Present on Admission?: Yes  Orientation: Left  Graft/Fistula Location: Upper Arm   Fistula/ Graft Assessment Abnormalities --    Needle Size --    Vitals   Heart Rate 98    BP 140/70 mmHg   Machine Metrics   $Treatment Started/Capturing Charge --    Blood Flow Rate (mL/min) 350 mL/min   Arterial Pressure (mmHg) -135 mmHg   Venous Pressure (mmHg) 100    Dialysate Flow Rate (mL/min) --    Transmembrane Pressure (mmHg) 89 mmHg   Ultrafiltration Rate (mL/Hr) 800 mL/hr   Fluid Removal (ml) 620 ml   Fluid Bolus (ml) 0 ml   Dialysate K (mEq/L) --    Dialysate CA (mEq/L) --    Hemodialysis Comments   Arteriovenous Lines Secure Yes      08/11/13  1500   Assessment   Cardiac (WDL) WDL   Cardiac Regularity Regular   Cardiac Symptoms None   Cardiac Rhythm Normal Sinus Rhythm   Respiratory Pattern Regular   Bilateral Breath Sounds Clear   R Breath Sounds Clear   L Breath Sounds Clear   Cough Non-productive   Generalized Edema None   Facial None   Sacral None   RLE Edema Non Pitting Edema   LLE Edema Non Pitting Edema   General Skin Color Appropriate for ethnicity   Skin Condition/Temp Warm;Dry   Gastrointestinal (WDL) WDL   Abdomen Inspection Soft   GI Symptoms None   Graft/Fistula Left   No Placement Date or Time found.   Present on Admission?: Yes  Orientation: Left  Graft/Fistula Location: Upper Arm   Fistula/ Graft Assessment Abnormalities  WDL   Pain Assessment   Pain Scale Used Numeric Scale (0-10)   Numeric Pain Scale   Pain Score 0      08/11/13 1419   Bedside Nurse Communication   Name of bedside RN - pre dialysis Kern Reap, RN   Treatment Initiation- With Dialysis Precautions   Time Out/Safety Check Completed Yes   Consent for HD signed for this hospitalization Y   Dialysis Precautions All Connections Secured   Dialysis Treatment Type Routine;Bedside   Is patient diabetic? Yes   RO/Hemodialysis Architectural technologist   Is Total Chlorine less than 0.1 ppm? Yes   Orignial Total Chlorine Testing Time 1415   At 4 Hour Total Chlorine Testing Time 1815   RO/Hemodialysis Archivist Number 2    RO # 2    Water Hardness 0    Pressure Test Verified Yes   Alarms Verified Passed   Machine Temperature 97.5 F (36.4 C)   Alarms Verified Yes   Hemodialysis Conductivity (Machine) 13.7    Dialyzer Lot Number [id number])   RO Machine Log Completed Yes   Hepatitis Status   HBsAg (Antigen) Result Negative   HBsAg Date Drawn 08/07/13   Vitals   Temp 98.7 F (37.1 C)   BP 138/90 mmHg   SpO2 95 %   O2 Device None (Room air)   Assessment   Mental Status Alert;Oriented            Intake/Output:  I/O this shift:  In: -   Out: 2000 [Other:2000]       Medications:  Current Facility-Administered Medications   Medication Dose Route Last Rate Last Dose   . sodium chloride 0.9 % bolus 100 mL  100 mL Other       . sodium chloride 0.9 % bolus 250 mL  250 mL Intravenous            Education:  Education  Person taught: Patient (08/11/13 1803)  Knowledge basis: Minimal (08/11/13 1803)  Topics taught: Fluid Management (08/11/13 1803)  Teaching Tools: Explain (08/11/13 1803)    Primary Nurse Communication:  Bedside Nurse Communication  Name of bedside RN - pre dialysis: Kern Reap, RN (08/11/13 1419)  Name of bedside RN - post dialysis: Kern Reap, RN (08/11/13 1803)      [facility] nurse signature/date/time:Erickson Yamashiro Andree Elk, RN 08/11/13 1830__________________________________________

## 2013-08-11 NOTE — Plan of Care (Signed)
Problem: Renal Failure  Goal: Fluid and electrolyte balance are achieved/maintained  Outcome: Progressing  Patient is breathing better and using IS up to 750 mls. Xray shows less fluid collection. Patient to be dialyzed today. Non pitting edema noted to bilateral lower extremities.

## 2013-08-11 NOTE — Plan of Care (Signed)
Problem: Renal Failure  Goal: Free from infection- Renal  Outcome: Progressing  Dialysis in progress . Heparin still in progress

## 2013-08-11 NOTE — Progress Notes (Addendum)
[facility] Progress Note      Date Time: 08/11/2013 11:39 AM    Pulmonary Attending addendum:   No new problems    In Dialysis    Lungs-decr. BS  Cor-RR    Imp/Plan as per Ms Flavia Shipper  Dialysis  F/U Office if discharged      24 hr Events/Subjective:   sats on RA 92-96%, no cough  Only able to raise 750 cc on IS  Some residual left chest pain (no right pain like on admission)  Walking in hall, mild DOE    Assessment:     1. Pleuritic chest pain with bilateral pleural effusions & atelectasis associated with hypoventilation - improved on CXR after pulm hygeine  2. Recent viral pericarditis  3. Diastolic dysfunction  4. ESRD on HD  5. LLE DVT - on coumadin: [place] 05/2013 with subtherapeutic INR on presentation  6. DM  7. HTN  8. Anemia    Plan:   1. Home from a pulmonary standpoint, may follow up in office with Dr Nechama Guard in two weeks  2. Continue walking and use IS 10 times every hour  3. DVT prophylaxis = on heparin drip  4. Code status = full      Medications:      Scheduled Meds: PRN Meds:           amLODIPine 5 mg Oral Daily   aspirin 81 mg Oral Daily   atorvastatin 10 mg Oral Daily   b complex-vitamin c-folic acid 1 tablet Oral Daily   bisacodyl 5 mg Oral Daily   colchicine 0.6 mg Oral Daily   gabapentin 300 mg Oral TID   insulin glargine 12 Units Subcutaneous QHS   pantoprazole 40 mg Oral QAM AC   pregabalin 75 mg Oral Q12H [place]   [COMPLETED] warfarin 7.5 mg Oral Once       Continuous Infusions:       . heparin 25000 units in dextrose 5% 500 mL 1,000 Units/hr (08/11/13 0750)         acetaminophen 650 mg Q6H PRN   dextrose 15 g PRN   dextrose 25 mL PRN   glucagon (rDNA) 1 mg PRN   heparin (porcine) 3,000 Units PRN   insulin regular 1-8 Units TID AC PRN   lidocaine  PRN   lidocaine  PRN   simethicone 80 mg Q6H PRN   traMADol 50 mg Q6H PRN           Physical Exam:     Filed Vitals:    08/10/13 2047 08/11/13 0019 08/11/13 0428 08/11/13 0808   BP: 126/59 108/55 139/75 131/69   Pulse: 91 96 94 89   Temp: 96.5 F (35.8 C) 97.7 F  (36.5 C) 97.8 F (36.6 C) 98.3 F (36.8 C)   TempSrc:       Resp: 18 18 18 18    Height:       Weight:       SpO2: 95% 92% 96% 92%     Temp (24hrs), Avg:97.5 F (36.4 C), Min:96.5 F (35.8 C), Max:98.3 F (36.8 C)    General Appearance:  Breathing comfortably, no acute distress  Lungs:    diminished bases, crackles RLL  Heart:    regular rhythm,  no murmur   Abdomen:   soft, non-tender  Extremities:   RLE >LLE  edema  Neurologic:  Alert and oriented, moves all extremities equally    No intake or output data in the 24 hours ending 08/11/13 1139  Active PICC Line / CVC Line / PIV Line / Drain / Airway / Intraosseous Line / Epidural Line / ART Line / Line / Wound / Pressure Ulcer / NG/OG Tube     Name   Placement date   Placement time   Site   Days    Peripheral IV 08/07/13 Right Other (Comment)  08/07/13      Other (Comment)   2    Graft/Fistula Left          --          Labs:       Lab 08/11/13 0834 08/09/13 1530 08/08/13 0645   GLU 133* 325* 168*        Lab 08/11/13 0303 08/10/13 0335 08/09/13 1530   WBC 7.84 7.63 8.42   RBC 2.64* 2.83* 2.83*   HGB 7.8* 8.3* 8.3*   HCT 23.3* 25.0* 25.0*   MCV 88.3 88.3 88.3   MCHC 33.5 33.2 33.2   RDW 14 14 14    MPV 10.9 10.7 11.3   PLT 247 228 304       Lab 08/11/13 0834 08/09/13 1530 08/08/13 0645   ALT -- -- --   AST -- -- --   GLOB -- -- --   ALB 2.4* 2.5* 2.3*       Lab 08/08/13 0127 08/07/13 1928 08/07/13 1016   CK 27* 31 34   TROPI <0.01 <0.01 <0.01   TROPT -- -- --   CKMBINDEX -- -- --       Lab 08/11/13 0834   BILITOTAL --   BILIDIRECT --   PROT --   ALB 2.4*   ALT --   AST --       Lab 08/11/13 0303 08/09/13 0627   PT -- 14.4   INR -- 1.1   PTT 67* --       Lab 08/11/13 0834 08/09/13 1530 08/08/13 0645   NA 137 131* 138   K 4.1 4.3 4.2   CL 100 96* 100   CO2 27 23 29    BUN 31* 39* 22*   CREAT 4.6* 5.1* 4.1*   CA 9.0 8.7 8.7   ALB 2.4* 2.5* 2.3*   PROT -- -- --   BILITOTAL -- -- --   ALKPHOS -- -- --   ALT -- -- --   AST -- -- --   GLU 133* 325* 168*        Microbiology:     Rads:   Radiological Procedure reviewed    8/17 CXR   Stable exam with small right pleural effusion and moderate left pleural  effusion.    Signed:  Chyrl Civatte NP  [facility]

## 2013-08-11 NOTE — Progress Notes (Signed)
Mary [name] is a 67 y.o. female patient with PMH notable for  DM, HTN, ESRD on hemodialysis, admitted because of SOB.    Active Problems:   Chest pain    Past Medical History   Diagnosis Date   . Diabetes mellitus without complication    . Hyperlipidemia    . Hypertensive disorder    . Chronic kidney disease      dialysis t-tr-sat   . Arthritis    . Cataracts, bilateral    . GERD (gastroesophageal reflux disease)    . Acute deep vein thrombosis (DVT) of distal vein of left lower extremity    . Hemodialysis access site with mature fistula      Current Facility-Administered Medications   Medication Dose Route Frequency Provider Last Rate Last Dose   . acetaminophen (TYLENOL) tablet 650 mg  650 mg Oral Q6H PRN Laroy Apple, MD       . amLODIPine (NORVASC) tablet 5 mg  5 mg Oral Daily Laroy Apple, MD   5 mg at 08/10/13 1248   . aspirin chewable tablet 81 mg  81 mg Oral Daily Laroy Apple, MD   81 mg at 08/11/13 1017   . atorvastatin (LIPITOR) tablet 10 mg  10 mg Oral Daily Laroy Apple, MD   10 mg at 08/10/13 2216   . b complex-vitamin c-folic acid (NEPHRO-VITE) tablet 1 tablet  1 tablet Oral Daily Laroy Apple, MD   1 tablet at 08/11/13 1016   . bisacodyl (DULCOLAX) EC tablet 5 mg  5 mg Oral Daily Noralyn Pick, MD   5 mg at 08/11/13 1017   . colchicine tablet 0.6 mg  0.6 mg Oral Daily Laroy Apple, MD   0.6 mg at 08/11/13 1016   . dextrose (GLUCOSE) 40 % oral gel 15 g  15 g Oral PRN Laroy Apple, MD       . dextrose 50 % bolus 25 mL  25 mL Intravenous PRN Laroy Apple, MD       . gabapentin (NEURONTIN) capsule 300 mg  300 mg Oral TID Laroy Apple, MD   300 mg at 08/11/13 1539   . glucagon (rDNA) (GLUCAGEN) injection 1 mg  1 mg Intramuscular PRN Laroy Apple, MD       . heparin (porcine) injection 3,000 Units  3,000 Units Intravenous PRN Noralyn Pick, MD   3,000 Units at 08/08/13 2352   .  heparin 25000 units in dextrose 5% 500 mL infusion (premix)  1,000 Units/hr Intravenous Continuous Noralyn Pick, MD 20 mL/hr at 08/11/13 0750 1,000 Units/hr at 08/11/13 0750   . insulin glargine (LANTUS) injection 12 Units  12 Units Subcutaneous QHS Laroy Apple, MD   12 Units at 08/10/13 2217   . insulin regular (HumuLIN R,NovoLIN R) injection 1-8 Units  1-8 Units Subcutaneous TID AC PRN Laroy Apple, MD   5 Units at 08/11/13 1241   . lidocaine (LIDODERM) 5 % 1 patch  1 patch Transdermal Q24H [place], Kittie Plater A, MD   1 patch at 08/11/13 1541   . lidocaine (LMX PLUS 4%) 4 % dressing   Topical PRN Alric Quan, MD   1 each at 08/09/13 1400   . lidocaine (LMX PLUS 4%) 4 % dressing   Topical PRN Alric Quan, MD       . pantoprazole (PROTONIX) EC tablet 40 mg  40 mg Oral QAM AC [place], Vida Rigger,  MD   40 mg at 08/11/13 1016   . pregabalin (LYRICA) capsule 75 mg  75 mg Oral Q12H [place] Laroy Apple, MD   75 mg at 08/11/13 1017   . simethicone (MYLICON) chewable tablet 80 mg  80 mg Oral Q6H PRN Noralyn Pick, MD   80 mg at 08/09/13 2309   . sodium chloride 0.9 % bolus 100 mL  100 mL Other Q1H PRN Alric Quan, MD       . sodium chloride 0.9 % bolus 250 mL  250 mL Intravenous PRN Alric Quan, MD       . traMADol Veatrice Bourbon) tablet 50 mg  50 mg Oral Q6H PRN Laroy Apple, MD   50 mg at 08/11/13 1025   . warfarin (COUMADIN) tablet 5 mg  5 mg Oral Daily at [place], [place] A, MD       . [COMPLETED] warfarin (COUMADIN) tablet 7.5 mg  7.5 mg Oral Once Michelene Heady, MD   7.5 mg at 08/10/13 1837     Allergies   Allergen Reactions   . Penicillins Hives   . Shrimp (Shellfish Allergy) Swelling     Blood pressure 133/68, pulse 91, temperature 97.3 F (36.3 C), temperature source Oral, resp. rate 20, height 1.626 m (5\' 4" ), weight 97.16 kg (214 lb 3.2 oz), SpO2 95.00%.    Subjective:  Symptoms:  She reports shortness of  breath.    Diet:  Adequate intake.    Activity level: Impaired due to weakness.      Objective:  General Appearance:  In no acute distress.    Vital signs: (most recent): Blood pressure 133/68, pulse 91, temperature 97.3 F (36.3 C), temperature source Oral, resp. rate 20, height 1.626 m (5\' 4" ), weight 97.16 kg (214 lb 3.2 oz), SpO2 95.00%.    Output: No urine output and producing stool.    Lungs:  There are decreased breath sounds.    Heart: Normal rate.    Neurological: Patient is alert.    Skin:  Warm.    Abdomen: Abdomen is soft.      Results     Procedure Component Value Units Date/Time    POCT glucose (AC and HS) [id number]  (Abnormal) Collected:08/11/13 1648     POCT Glucose WB 138 (A) mg/dL Updated:08/11/13 1648    APTT [id number]  (Abnormal) Collected:08/11/13 1448     PTT 64 (H) Updated:08/11/13 1516     APTT Anticoag. Given w/i 48 hrs. heparin     Narrative:    For 6 Hours    POCT glucose (AC and HS) [id number]  (Abnormal) Collected:08/11/13 1201     POCT Glucose WB 286 (A) mg/dL Updated:08/11/13 1201    Renal function panel [id number]  (Abnormal) Collected:08/11/13 0834    Specimen Information:Blood Updated:08/11/13 0927     Glucose 133 (H) mg/dL      Sodium 137      Potassium 4.1      Chloride 100      CO2 27      BUN 31 (H) mg/dL      CALCIUM 9.0 mg/dL      Creatinine 4.6 (H) mg/dL      Albumin 2.4 (L) g/dL      Phosphorus 4.6 mg/dL      Anion Gap 10.0     GFR [id number] Collected:08/11/13 0834     EGFR 11.4 Updated:08/11/13 0927    POCT glucose (AC and HS) [id number]  (Abnormal) Collected:08/11/13 [id number]  POCT Glucose WB 174 (A) mg/dL Updated:08/11/13 0635    CBC [id number]  (Abnormal) Collected:08/11/13 0303    Specimen Information:Blood / Blood Updated:08/11/13 0343     WBC 7.84      RBC 2.64 (L)      Hgb 7.8 (L) g/dL      Hematocrit 23.3 (L) %      MCV 88.3 fL      MCH 29.5 pg      MCHC 33.5 g/dL      RDW 14 %      Platelets 247      MPV 10.9 fL      Nucleated RBC 0     Narrative:    Per  low intensity heparin protocol.    APTT [id number]  (Abnormal) Collected:08/11/13 0303     PTT 67 (H) Updated:08/11/13 0336     APTT Anticoag. Given w/i 48 hrs. heparin     POCT glucose (AC and HS) [id number]  (Abnormal) Collected:08/10/13 2204     POCT Glucose WB 277 (A) mg/dL Updated:08/10/13 2205    APTT [id number]  (Abnormal) Collected:08/10/13 1915     PTT 51 (H) Updated:08/10/13 1935     APTT Anticoag. Given w/i 48 hrs. heparin       Echocardiogram Adult Complete W Clr/ Dopp Waveform    08/01/2013  impression: 1. Atrial septal aneurysm is present. 2. There is a small to moderate pericardial effusion noted both anteriorly and posteriorly.  this is only slightly increased from the previous echocardiogram dated 07/20/2013. Variation of tricuspid flow velocity is noted. There is no diastolic collapse of the right ventricle noted. There is organized material note noted in the apex of the right ventricle not previously reported. Small small pleural effusion is present. Clinical correlation is required considering these echocardiographic changes. 3. There are no other significant echogram echocardiographic changes noted.  Lennon Alstrom, MD  08/01/2013 2:03 PM     Echocardiogram Adult Complete W Color Doppler Waveform    07/20/2013   Normal left ventricular systolic function. No significant valvular heart disease. Small pericardial effusion without evidence of cardiac tamponade.    Audria Nine, MD  07/20/2013 4:09 PM     Ct Abdomen Pelvis Wo Iv/ Wo Po Cont    07/20/2013   1. Interval development of a minimal to moderate pericardial effusion and trace pleural effusions since prior exam one day earlier. 2. No detectable acute abnormalities in the abdomen or pelvis. 3. Contrast residue gallbladder, kidneys, and bladder. 4. Numerous small low-attenuation kidney lesions. 5. Remainder as above. 6. Findings discussed with Dr. Beryle Lathe by Dr. Oren Binet at 3:59 AM.  Sheria Lang, MD  07/20/2013 8:23 AM     Xr Chest 2  Views    08/10/2013   Small pleural effusions and basilar atelectasis, decreased from prior.  Loel Ro, MD  08/10/2013 10:22 AM     Ct Angio Chest    07/19/2013   1. No CT scan evidence for detectable pulmonary embolism. 2. Minimal cardiomegaly. 3. Minimal bibasilar lung atelectasis. 4. Remainder as above.  Sheria Lang, MD  07/19/2013 3:18 PM     Xr Chest Ap Portable    08/07/2013   Stable exam with small right pleural effusion and moderate left pleural effusion.  Loel Lofty. Anna Genre, MD  08/07/2013 9:38 AM     Chest Ap Portable    07/30/2013    1. New bilateral pleural effusions with bibasilar airspace opacities. 2. Stable cardiomegaly.  Lurena Joiner  Darl Householder, MD  07/30/2013 4:33 PM     Xr Chest Ap Portable    07/19/2013   Hypoinflation with minimal basilar atelectasis.  Sheria Lang, MD  07/19/2013 2:45 PM     Assessment:  (1.chest pain  2.pleural effusion  3.viral pericarditis  4.ESRD  5.DM  6.MGUS  7.HTN  8.anemia).       Plan:   (1.Hemodialysis with UF today  2.aranesp sq q weekly  3.monitor serum electrolytes).       Alric Quan  08/11/2013

## 2013-08-11 NOTE — Plan of Care (Signed)
Problem: Physical Therapy  Goal: Patient condition is improving per Physical Therapy Treatment Plan  Outcome: Not Progressing  Patient is ambulating in the hallway with PT without any issues or problems. Spending more time out of bed.

## 2013-08-11 NOTE — Progress Notes (Signed)
aptt at 3 pm was 64 no changes and next aptt is 0300 tom.

## 2013-08-12 ENCOUNTER — Inpatient Hospital Stay: Payer: Medicare Other

## 2013-08-12 LAB — APTT
PTT: 41 — ABNORMAL HIGH (ref 23–37)
PTT: 59 — ABNORMAL HIGH (ref 23–37)

## 2013-08-12 LAB — PT AND APTT
PT INR: 1 (ref 0.9–1.1)
PT: 13 (ref 12.6–15.0)
PTT: 65 — ABNORMAL HIGH (ref 23–37)

## 2013-08-12 LAB — POCT GLUCOSE
Whole Blood Glucose POCT: 96 mg/dL (ref 70–100)
Whole Blood Glucose POCT: 253 mg/dL — AB (ref 70–100)
Whole Blood Glucose POCT: 191 mg/dL — AB (ref 70–100)
Whole Blood Glucose POCT: 204 mg/dL — AB (ref 70–100)

## 2013-08-12 MED ORDER — WARFARIN SODIUM 5 MG PO TABS
5.0000 mg | ORAL_TABLET | Freq: Every day | ORAL | Status: DC
Start: 2013-08-13 — End: 2013-08-13

## 2013-08-12 MED ORDER — WARFARIN SODIUM 10 MG PO TABS
10.0000 mg | ORAL_TABLET | Freq: Once | ORAL | Status: AC
Start: 2013-08-12 — End: 2013-08-12
  Administered 2013-08-12: 10 mg via ORAL
  Filled 2013-08-12: qty 1

## 2013-08-12 NOTE — Progress Notes (Signed)
PROGRESS NOTE    Date Time: 08/12/2013 10:56 PM  Patient Name: Mary [name], Mary [name]    Assessment/ Plan:   Mary [name] is a 67 y.o. female with hx of recent viral pericarditis, diastolic congestive heart failure, small to moderate pericardial effusion and bilat pleural effusions, ESRD on HD, DM, HTN, HLP, LLE DVT and diabetic nephropathy,admitted with chest pain and SOB.  1- Chest pain: atypical, now resolved, pt has pain below her scapula that is fully reproducible with a positional element. Could be MSK. Pt seen by Dr. Posey Pronto from cardiology and felt that chest pain is due to volume overload, recent pericarditis, continue colchicine, no chest pain since HD, pt now ambulatory.  2- SOB:' Acute on chronic diastolic heart failuremuch improved since HD. Appreciate pulm input. Repeat CXR today showing stable pleural effusions and atelectasis. Pt sats stable on RA. Outpatient follow up with Dr.Shuman. Continue IS  3- ESRD: s/p HD with fluid removal, follow electrolytes.  4- LLE DVT: INR 1 today as pt had not been compliant with her coumadin. Start coumadin 10 mg today and then resume home dose of 5 mg daily from tomorrow. Follow INR. Continue Heparin drip for now until therapeutic on coumadin, per Dr.Ikhinmwin.  5- hx of pericardial effusion: small pericardial effusion on echo from 08/05/2013 per cardiology.  ACD: no evidence of active blood loss, monitor closely, transfuse as needed.  6- IDDM: continue Lantus, SSI, accuchecks   DVT Proph: on Heparin drip   Code Status: Full Code     Subjective:   Pt still complains of lower posterior "rib" pain, SOB much improved, pt ambulatory around hallways. Afebrile, no chest pain.    Medications:      Scheduled Meds: PRN Meds:         amLODIPine 5 mg Oral Daily   aspirin 81 mg Oral Daily   atorvastatin 10 mg Oral Daily   b complex-vitamin c-folic acid 1 tablet Oral Daily   bisacodyl 5 mg Oral Daily   colchicine 0.6 mg Oral Daily   gabapentin 300 mg Oral TID   insulin glargine 12  Units Subcutaneous QHS   lidocaine 1 patch Transdermal Q24H   pantoprazole 40 mg Oral QAM AC   pregabalin 75 mg Oral Q12H [place]   [COMPLETED] warfarin 10 mg Oral Once   [START ON 08/13/2013] warfarin 5 mg Oral Daily at 1800   [DISCONTINUED] warfarin 5 mg Oral Daily at 1800       Continuous Infusions:       . heparin 25000 units in dextrose 5% 500 mL 1,000 Units/hr (08/12/13 1330)         acetaminophen 650 mg Q6H PRN   dextrose 15 g PRN   dextrose 25 mL PRN   glucagon (rDNA) 1 mg PRN   heparin (porcine) 3,000 Units PRN   insulin regular 1-8 Units TID AC PRN   lidocaine  PRN   lidocaine  PRN   simethicone 80 mg Q6H PRN   traMADol 50 mg Q6H PRN           Review of Systems:   A comprehensive review of systems was: Negative except abov    Physical Exam:     Filed Vitals:    08/12/13 0826 08/12/13 1123 08/12/13 1644 08/12/13 2015   BP: 99/63 81/59 116/60 148/79   Pulse: 91   87   Temp: 97.8 F (36.6 C) 98.8 F (37.1 C) 98.4 F (36.9 C) 98.4 F (36.9 C)   TempSrc:  Resp: 18 17     Height:       Weight:       SpO2: 95%   91%         Lab 08/11/13 0834 08/09/13 1530 08/08/13 0645   GLU 133* 325* 168*       General: NAD  HEENT: perrla, eomi, Conjunctiva clear  Neck: supple, FROM, no LAD  Cardiovascular: RRR, no r/g, reproducible pain over lower ribs posteriorly  Lungs: CTAB, no w/r/r, diminished air entry at bases bilat  Abdomen: soft, +BS, NT/ND, no masses, no g/r  Extremities: trace bilat lower ext edema  Neuro: CN grossly intact, No gross focal deficits  Skin: no rashes or lesions noted  Psych: AAOx3, normal affect      Labs:     Lab 08/11/13 0303 08/10/13 0335 08/09/13 1530   WBC 7.84 7.63 8.42   HGB 7.8* 8.3* 8.3*   HCT 23.3* 25.0* 25.0*   MCV 88.3 88.3 88.3   RDW 14 14 14    PLT 247 228 304         Lab 08/11/13 0834 08/09/13 1530 08/08/13 0645   NA 137 131* 138   K 4.1 4.3 4.2   CL 100 96* 100   CO2 27 23 29    BUN 31* 39* 22*   CREAT 4.6* 5.1* 4.1*   GLU 133* 325* 168*   CA 9.0 8.7 8.7       No results found for  this basename: AST:5,ALT:5,ALKPHOS:5,BILITOTAL:5,BILIDIRECT:5,PROT:5 in the last 168 hours        Rads:   Radiological Procedure reviewed.  Please see above.        Signed by: Phillips Odor, MD

## 2013-08-12 NOTE — Plan of Care (Signed)
Problem: Safety  Goal: Patient will be free from injury during hospitalization  Outcome: Progressing  Safety to be maintained.  Bed alarm on.  Call bell and phone within pt reach.  Continue to monitor.

## 2013-08-12 NOTE — Plan of Care (Signed)
Problem: Pain  Goal: Patient's pain/discomfort is manageable  Outcome: Completed Date Met:  08/12/13  Pt is pain free at this time.  Continue to monitor.

## 2013-08-12 NOTE — Plan of Care (Signed)
Problem: Safety  Goal: Patient will be free from injury during hospitalization  Outcome: Progressing  Fall precautions in place. Hourly rounding done. Bed alarm on. Patient compliant.    Problem: Pain  Goal: Patient's pain/discomfort is manageable  Outcome: Progressing  Pain assessed throughout shift and PRN pain med given as ordered.

## 2013-08-12 NOTE — Progress Notes (Addendum)
[facility] Progress Note      Date Time: 08/12/2013 1:35 PM    Pulmonary Attending addendum:   Pt.seen and examined and agree with NP lungs are clear, labs and xrays reviewed and questions answered     J.Chrishawna Farina MD    24 hr Events/Subjective:   CXR ordered by renal - no change in effusions & atelectasis  sats 95% on RA  Using IS 1000cc  HD yesterday - 2600cc  off  Heparin drip continues - to start coumadin    Assessment:     1. Pleuritic chest pain with bilateral pleural effusions & atelectasis associated with hypoventilation - improved on CXR after pulm hygeine  2. Recent viral pericarditis  3. Diastolic dysfunction  4. ESRD on HD  5. LLE DVT - on coumadin: [place] 05/2013 with subtherapeutic INR on presentation  6. DM  7. HTN  8. Anemia    Plan:   1. Home from a pulmonary standpoint, may follow up in office with Dr Nechama Guard in two weeks  2. Continue walking and use IS 10 times every hour  3. DVT prophylaxis = on heparin drip  4. Code status = full  5. Will sign off      Medications:      Scheduled Meds: PRN Meds:           amLODIPine 5 mg Oral Daily   aspirin 81 mg Oral Daily   atorvastatin 10 mg Oral Daily   b complex-vitamin c-folic acid 1 tablet Oral Daily   bisacodyl 5 mg Oral Daily   colchicine 0.6 mg Oral Daily   gabapentin 300 mg Oral TID   insulin glargine 12 Units Subcutaneous QHS   lidocaine 1 patch Transdermal Q24H   pantoprazole 40 mg Oral QAM AC   pregabalin 75 mg Oral Q12H [place]   warfarin 5 mg Oral Daily at 1800       Continuous Infusions:       . heparin 25000 units in dextrose 5% 500 mL 1,000 Units/hr (08/12/13 1330)         acetaminophen 650 mg Q6H PRN   dextrose 15 g PRN   dextrose 25 mL PRN   glucagon (rDNA) 1 mg PRN   heparin (porcine) 3,000 Units PRN   insulin regular 1-8 Units TID AC PRN   lidocaine  PRN   lidocaine  PRN   simethicone 80 mg Q6H PRN   [EXPIRED] sodium chloride 100 mL Q1H PRN   [EXPIRED] sodium chloride 250 mL PRN   traMADol 50 mg Q6H PRN           Physical Exam:     Filed Vitals:    08/11/13  2336 08/12/13 0401 08/12/13 0826 08/12/13 1123   BP: 123/60 139/61 99/63 81/59    Pulse: 99 93 91    Temp: 97.9 F (36.6 C) 97.9 F (36.6 C) 97.8 F (36.6 C) 98.8 F (37.1 C)   TempSrc:       Resp: 18 18 18 17    Height:       Weight:       SpO2: 93% 93% 95%      Temp (24hrs), Avg:98.1 F (36.7 C), Min:97.3 F (36.3 C), Max:98.8 F (37.1 C)    General Appearance:  Breathing comfortably, no acute distress  Lungs:    diminished bases  Heart:    regular rhythm,  no murmur   Abdomen:   soft, non-tender  Extremities:   Trace RLE >LLE  edema  Neurologic:  Alert  and oriented, moves all extremities equally      Intake/Output Summary (Last 24 hours) at 08/12/13 1335  Last data filed at 08/11/13 1803   Gross per 24 hour   Intake      0 ml   Output   2000 ml   Net  -2000 ml       Active PICC Line / CVC Line / PIV Line / Drain / Airway / Intraosseous Line / Epidural Line / ART Line / Line / Wound / Pressure Ulcer / NG/OG Tube     Name   Placement date   Placement time   Site   Days    Peripheral IV 08/07/13 Right Other (Comment)  08/07/13      Other (Comment)   2    Graft/Fistula Left          --          Labs:       Lab 08/11/13 0834 08/09/13 1530 08/08/13 0645   GLU 133* 325* 168*        Lab 08/11/13 0303 08/10/13 0335 08/09/13 1530   WBC 7.84 7.63 8.42   RBC 2.64* 2.83* 2.83*   HGB 7.8* 8.3* 8.3*   HCT 23.3* 25.0* 25.0*   MCV 88.3 88.3 88.3   MCHC 33.5 33.2 33.2   RDW 14 14 14    MPV 10.9 10.7 11.3   PLT 247 228 304       Lab 08/11/13 0834 08/09/13 1530 08/08/13 0645   ALT -- -- --   AST -- -- --   GLOB -- -- --   ALB 2.4* 2.5* 2.3*       Lab 08/08/13 0127 08/07/13 1928 08/07/13 1016   CK 27* 31 34   TROPI <0.01 <0.01 <0.01   TROPT -- -- --   CKMBINDEX -- -- --       Lab 08/11/13 0834   BILITOTAL --   BILIDIRECT --   PROT --   ALB 2.4*   ALT --   AST --       Lab 08/12/13 1239 08/12/13 0331   PT -- 13.0   INR -- 1.0   PTT 41* --       Lab 08/11/13 0834 08/09/13 1530 08/08/13 0645   NA 137 131* 138   K 4.1 4.3 4.2   CL  100 96* 100   CO2 27 23 29    BUN 31* 39* 22*   CREAT 4.6* 5.1* 4.1*   CA 9.0 8.7 8.7   ALB 2.4* 2.5* 2.3*   PROT -- -- --   BILITOTAL -- -- --   ALKPHOS -- -- --   ALT -- -- --   AST -- -- --   GLU 133* 325* 168*       Microbiology:     Rads:   Radiological Procedure reviewed    8/17 CXR   Stable exam with small right pleural effusion and moderate left pleural  effusion.    Signed:  Chyrl Civatte NP  [facility]

## 2013-08-13 LAB — POCT GLUCOSE
Whole Blood Glucose POCT: 147 mg/dL — AB (ref 70–100)
Whole Blood Glucose POCT: 110 mg/dL — AB (ref 70–100)
Whole Blood Glucose POCT: 157 mg/dL — AB (ref 70–100)
Whole Blood Glucose POCT: 200 mg/dL — AB (ref 70–100)

## 2013-08-13 LAB — COMPREHENSIVE METABOLIC PANEL
ALT: 35 U/L (ref 0–55)
AST (SGOT): 28 U/L (ref 5–34)
Albumin/Globulin Ratio: 0.7 — ABNORMAL LOW (ref 0.9–2.2)
Albumin: 2.6 g/dL — ABNORMAL LOW (ref 3.5–5.0)
Alkaline Phosphatase: 77 U/L (ref 40–150)
Anion Gap: 12 (ref 5.0–15.0)
BUN: 34 mg/dL — ABNORMAL HIGH (ref 7–19)
Bilirubin, Total: 0.5 mg/dL (ref 0.2–1.2)
CO2: 25 (ref 22–29)
Calcium: 9 mg/dL (ref 8.5–10.5)
Chloride: 93 — ABNORMAL LOW (ref 98–107)
Creatinine: 4.3 mg/dL — ABNORMAL HIGH (ref 0.6–1.0)
Globulin: 3.9 g/dL — ABNORMAL HIGH (ref 2.0–3.6)
Glucose: 139 mg/dL — ABNORMAL HIGH (ref 70–100)
Potassium: 4.6 (ref 3.5–5.1)
Protein, Total: 6.5 g/dL (ref 6.0–8.3)
Sodium: 130 — ABNORMAL LOW (ref 136–145)

## 2013-08-13 LAB — APTT
PTT: 77 — ABNORMAL HIGH (ref 23–37)
PTT: 71 — ABNORMAL HIGH (ref 23–37)

## 2013-08-13 LAB — CBC
Hematocrit: 26.4 % — ABNORMAL LOW (ref 37.0–47.0)
Hgb: 8.9 g/dL — ABNORMAL LOW (ref 12.0–16.0)
MCH: 29.1 pg (ref 28.0–32.0)
MCHC: 33.7 g/dL (ref 32.0–36.0)
MCV: 86.3 fL (ref 80.0–100.0)
MPV: 11 fL (ref 9.4–12.3)
Nucleated RBC: 0 (ref 0–1)
Platelets: 251 (ref 140–400)
RBC: 3.06 — ABNORMAL LOW (ref 4.20–5.40)
RDW: 14 % (ref 12–15)
WBC: 9.83 (ref 3.50–10.80)

## 2013-08-13 LAB — PT/INR
PT INR: 1.1 (ref 0.9–1.1)
PT: 14.4 (ref 12.6–15.0)

## 2013-08-13 LAB — GFR: EGFR: 12.4

## 2013-08-13 MED ORDER — OXYCODONE-ACETAMINOPHEN 5-325 MG PO TABS
2.0000 | ORAL_TABLET | Freq: Four times a day (QID) | ORAL | Status: DC | PRN
Start: 2013-08-13 — End: 2013-08-19
  Administered 2013-08-13: 2 via ORAL
  Administered 2013-08-14: 1 via ORAL
  Administered 2013-08-15 – 2013-08-18 (×3): 2 via ORAL
  Filled 2013-08-13: qty 2
  Filled 2013-08-13: qty 1
  Filled 2013-08-13 (×3): qty 2

## 2013-08-13 MED ORDER — WARFARIN SODIUM 7.5 MG PO TABS
7.5000 mg | ORAL_TABLET | Freq: Once | ORAL | Status: AC
Start: 2013-08-13 — End: 2013-08-13
  Administered 2013-08-13: 7.5 mg via ORAL
  Filled 2013-08-13: qty 1

## 2013-08-13 MED ORDER — ALUM & MAG HYDROXIDE-SIMETH 200-200-20 MG/5ML PO SUSP
30.0000 mL | ORAL | Status: DC | PRN
Start: 2013-08-13 — End: 2013-08-19

## 2013-08-13 MED ORDER — SODIUM CHLORIDE 0.9 % IV BOLUS
250.0000 mL | INTRAVENOUS | Status: AC | PRN
Start: 2013-08-13 — End: 2013-08-13

## 2013-08-13 MED ORDER — AMLODIPINE BESYLATE 2.5 MG PO TABS
2.5000 mg | ORAL_TABLET | Freq: Every day | ORAL | Status: DC
Start: 2013-08-14 — End: 2013-08-19
  Administered 2013-08-14 – 2013-08-19 (×6): 2.5 mg via ORAL
  Filled 2013-08-13 (×6): qty 1

## 2013-08-13 MED ORDER — FAMOTIDINE 20 MG PO TABS
20.0000 mg | ORAL_TABLET | Freq: Once | ORAL | Status: AC
Start: 2013-08-13 — End: 2013-08-13
  Administered 2013-08-13: 20 mg via ORAL

## 2013-08-13 MED ORDER — WARFARIN SODIUM 10 MG PO TABS
10.0000 mg | ORAL_TABLET | Freq: Once | ORAL | Status: DC
Start: 2013-08-13 — End: 2013-08-13

## 2013-08-13 MED ORDER — SODIUM CHLORIDE 0.9 % IV BOLUS
100.0000 mL | INTRAVENOUS | Status: AC | PRN
Start: 2013-08-13 — End: 2013-08-13

## 2013-08-13 MED ORDER — LIDOCAINE-TRANSPARENT DRESSING 4 % EX KIT
PACK | CUTANEOUS | Status: DC | PRN
Start: 2013-08-13 — End: 2013-08-19
  Administered 2013-08-16 – 2013-08-18 (×2): 1 via TOPICAL
  Filled 2013-08-13 (×2): qty 1

## 2013-08-13 NOTE — Progress Notes (Signed)
Nutritional Support Services  Nutrition Assessment    Mary [name] 67 y.o. female   MRN: [id number]      Reason for Assessment:  LOS Nutrition Screening/LOS = 6 Days    Nutrition Summary: Hospital day #7 for 67 yo female admit with cc of CP.  PMH significant for ESRD and HD dependency.  Pt reports to try to comply with low sodium diet and minimize fluid intake but does admit to meal skipping due to time management on dialysis days.    Pt reports target wt value of 189# for post dialysis.    Nutrition Diagnosis:  Pt with food and nutrition knowledge deficit as related to suspected competing value systems and as evidenced by reports of chronic meal skipping on dialysis days.    Interventions:  1. Verbal reinforcement of role of adequate protein intake to assist with fluid mobilization in setting of HD dependency  2. Encouraged pt to order all meals while hospitalized, no meal skipping     Goal: Pt to avoid meal skipping during hospitalization (status: on-going)    Assessment Data:  Adm dx:  CP  Patient Active Problem List   Diagnosis   . Cellulitis of left leg   . ESRD (end stage renal disease)   . DM (diabetes mellitus)   . Hypertension   . Cellulitis   . Chest pain at rest   . Viral pericarditis   . Dyspnea   . Chest pain   . Pleural effusion       PMH:  has a past medical history of Diabetes mellitus without complication; Hyperlipidemia; Hypertensive disorder; Chronic kidney disease; Arthritis; Cataracts, bilateral; GERD (gastroesophageal reflux disease); Acute deep vein thrombosis (DVT) of distal vein of left lower extremity; and Hemodialysis access site with mature fistula.    Pertinent labs:  Lab 08/11/13 0834 08/09/13 1530 08/08/13 0645   NA 137 131* 138   K 4.1 4.3 4.2   CL 100 96* 100   CO2 27 23 29    BUN 31* 39* 22*   CREAT 4.6* 5.1* 4.1*   GLU 133* 325* 168*   CA 9.0 8.7 8.7   MG -- -- --   PHOS 4.6 5.1* 5.5*     Pertinent meds include: insulin, coumadin, pantoprazole    Social History:  Unclear of support  systems    Diet History:  Pt reports to skip breakfast on dialysis days due to lack of time and large part of the morning spent in receiving HD tx.  Pt attempts to scan all food labels and limits rich sodium food products to minimal presence in diet.  Consumes a variety of food products from all major food groups and is an independent self feeder.    Current Diet Order  Diet cardiac Fluid restriction:: 1500 ML FLUID; Na restriction, if any:: 2 GM NA    Food intake:  75%    GI symptoms: Abdominal discomfort, lots of bloating this AM per pt    Learning Needs:Reinforcement of avoidance of meal skipping in order to achieve protein goal intakes    Anthropometrics  Height: 162.6 cm (5\' 4" )  Weight: 97.16 kg (214 lb 3.2 oz)  Weight Change: 3.98   IBW/kg (Calculated) Female: 59.1 kg  IBW/kg (Calculated) Female: 54.54 kg  BMI (calculated): 35.4        Monitoring/Evaluation:   -Will monitor % PO trends, weight, and clinical course.     -Will f/u per Nutrition Services LOS and [employer] policy, available per consult  order      Urban Gibson, MS, RD, [place]  Ext. [phone]

## 2013-08-13 NOTE — Progress Notes (Signed)
[facility]  HOSPITALIST  PROGRESS NOTE      Patient: Mary [name]  Date: 08/13/2013   LOS: 6 Days  Admission Date: 08/07/2013   MRN: [id number]  Attending: Kelli Churn MD     ASSESSMENT/PLAN   Mary [name] is a 67 y.o. female with hx of recent viral pericarditis, diastolic congestive heart failure, small to moderate pericardial effusion and bilat pleural effusions, ESRD on HD, DM, HTN, HLP, LLE DVT and diabetic nephropathy,admitted with chest pain and SOB.     1. Chest pain - multifactorial, atypical. Possibly musculoskeletal because the pain is reproducible. Pt was seen by Cardiology, who thought the pain could also be from recent pericarditis and or fluid overload   Plan: Percocet given for pain today. Also gave pepcid and maalox because pt was complaining of gas pain   - c/w aspirin 81 mg pO daily, atorvastatin, and pantoprazole   - c/w colchicine for recent pericarditis   2. ESRD on HD - pt appears fluid overloaded in the setting of ESRD. pt received HD today,  Plan: - appreciate Renal consult, pt follows with Dr. Gaspar Garbe   4. Recent DVT with subtherapeutic INR- pt is on heparin gtt, bridging to coumadin. Cannot receive lovenox because of ESRD. INR 1.1 today  Plan: check INR daily, Coumadin 7.5 mg PO QHS tonight   5. Normocytic anemia - likely anemia of chronic disease in the setting of ESRD, pt on aranesp weekly   6. Bilateral pleural effusions with small to moderate pericardial effusion - seen on TTE on 8/11 - slightly increased from the previous echo from 7/30. Pt has hx of recent pericarditis treated with ibuprofen and now colchicine , CXR shows stability of effusions  7. DM - c/w Lantus and insulin sliding scale , continue gabapentin and lyrica for diabetic neuropathy  8. Atrial septal aneurysm seen on TTE   9. Hypertension - hypotensive yesterday, decreased amlodopine to 2.5 mg PO daily  10. Acute on chronic dyastolic CHF in the setting of ESRD - c/w dialysis for fluid overload management  11. HLD  - atorvastatin     DVT PPX: already on heparin gtt   Diet: cardiac diet, fluid restriction   Code: Full code     SUBJECTIVE     Pt seen on HD this am, no complaints. No overnight events. No complaint of melena, hematochezia    MEDICATIONS     Current Facility-Administered Medications   Medication Dose Route Frequency   . [START ON 08/14/2013] amLODIPine  2.5 mg Oral Daily   . aspirin  81 mg Oral Daily   . atorvastatin  10 mg Oral Daily   . b complex-vitamin c-folic acid  1 tablet Oral Daily   . bisacodyl  5 mg Oral Daily   . colchicine  0.6 mg Oral Daily   . famotidine  20 mg Oral Once   . gabapentin  300 mg Oral TID   . insulin glargine  12 Units Subcutaneous QHS   . lidocaine  1 patch Transdermal Q24H   . pantoprazole  40 mg Oral QAM AC   . pregabalin  75 mg Oral Q12H [place]   . warfarin  7.5 mg Oral Once   . [DISCONTINUED] amLODIPine  5 mg Oral Daily   . [DISCONTINUED] warfarin  10 mg Oral Once   . [DISCONTINUED] warfarin  5 mg Oral Daily at 1800       ROS     Remainder of 10 point ROS as above or  otherwise negative    PHYSICAL EXAM     Filed Vitals:    08/13/13 1933   BP: 127/53   Pulse: 104   Temp: 98.8 F (37.1 C)   Resp:    SpO2: 93%       Temperature: Temp  Min: 95.6 F (35.3 C)  Max: 98.8 F (37.1 C)  Pulse: Pulse  Min: 82   Max: 104   Respiratory: Resp  Min: 16   Max: 16   Non-Invasive BP: BP  Min: 81/55  Max: 176/71  Pulse Oximetry SpO2  Min: 91 %  Max: 96 %    Intake and Output Summary (Last 24 hours) at Date Time    Intake/Output Summary (Last 24 hours) at 08/13/13 1937  Last data filed at 08/13/13 1335   Gross per 24 hour   Intake      0 ml   Output   1300 ml   Net  -1300 ml       GEN APPEARANCE: Normal;  A&OX3  HEENT: PERLA; EOMI; Conjunctiva Clear  NECK: Supple; No bruits  CVS: RRR, S1, S2; No M/G/R  LUNGS: CTAB; decreased breath sounds bases of lungs   ABD: Soft; No TTP; + Normoactive BS  EXT: No edema; Pulses 2+ and intact  SKIN: No rash or Lesions  NEURO: CN 2-12 intact; No Focal neurological  deficits      LABS       Lab 08/13/13 0954 08/11/13 0303 08/10/13 0335   WBC 9.83 7.84 7.63   RBC 3.06* 2.64* 2.83*   HGB 8.9* 7.8* 8.3*   HCT 26.4* 23.3* 25.0*   MCV 86.3 88.3 88.3   PLT 251 247 228         Lab 08/13/13 0954 08/11/13 0834 08/09/13 1530 08/08/13 0645   NA 130* 137 131* 138   K 4.6 4.1 4.3 4.2   CL 93* 100 96* 100   CO2 25 27 23 29    BUN 34* 31* 39* 22*   CREAT 4.3* 4.6* 5.1* 4.1*   GLU 139* 133* 325* 168*   CA 9.0 9.0 8.7 8.7   MG -- -- -- --         Lab 08/13/13 0954 08/11/13 0834 08/09/13 1530   ALT 35 -- --   AST 28 -- --   GGT -- -- --   BILITOTAL 0.5 -- --   BILIDIRECT -- -- --   ALB 2.6* 2.4* 2.5*   ALKPHOS 77 -- --         Lab 08/08/13 0127 08/07/13 1928 08/07/13 1016   CK 27* 31 34   CKMB -- -- --   CKMBINDEX -- -- --   TROPI <0.01 <0.01 <0.01         Lab 08/13/13 1706 08/13/13 0954 08/12/13 1949 08/12/13 0331 08/09/13 0627   INR -- 1.1 -- 1.0 1.1   PT -- 14.4 -- 13.0 14.4   PTT 71* 77* 59* -- --         RADIOLOGY     Radiological Procedure reviewed.    XR CHEST AP PORTABLE    Final Result:      Stable exam with small right pleural effusion and moderate left pleural    effusion.        Loel Lofty. Anna Genre, MD     08/07/2013 9:38 AM   XR CHEST 2 VIEWS    Final Result:  Small pleural effusions and basilar atelectasis, decreased     from  prior.        Loel Ro, MD     08/10/2013 10:22 AM   XR CHEST 2 VIEWS    Final Result:      1. No interval change in the bilateral pleural effusions and    atelectasis.        Dennison Mascot, MD     08/12/2013 8:52 AM       Signed,  Kelli Churn MD  7:37 PM 08/13/2013

## 2013-08-13 NOTE — Progress Notes (Signed)
Mary [name] is a 67 y.o. female patient with PMH notable for  DM, HTN, ESRD on hemodialysis, admitted because of SOB.    Active Problems:   Chest pain    Past Medical History   Diagnosis Date   . Diabetes mellitus without complication    . Hyperlipidemia    . Hypertensive disorder    . Chronic kidney disease      dialysis t-tr-sat   . Arthritis    . Cataracts, bilateral    . GERD (gastroesophageal reflux disease)    . Acute deep vein thrombosis (DVT) of distal vein of left lower extremity    . Hemodialysis access site with mature fistula      Current Facility-Administered Medications   Medication Dose Route Frequency Provider Last Rate Last Dose   . acetaminophen (TYLENOL) tablet 650 mg  650 mg Oral Q6H PRN Laroy Apple, MD       . amLODIPine (NORVASC) tablet 5 mg  5 mg Oral Daily Laroy Apple, MD   5 mg at 08/12/13 0935   . aspirin chewable tablet 81 mg  81 mg Oral Daily Laroy Apple, MD   81 mg at 08/13/13 [id number]   . atorvastatin (LIPITOR) tablet 10 mg  10 mg Oral Daily Laroy Apple, MD   10 mg at 08/12/13 1943   . b complex-vitamin c-folic acid (NEPHRO-VITE) tablet 1 tablet  1 tablet Oral Daily Laroy Apple, MD   1 tablet at 08/12/13 [phone]   . bisacodyl (DULCOLAX) EC tablet 5 mg  5 mg Oral Daily Noralyn Pick, MD   5 mg at 08/13/13 [id number]   . colchicine tablet 0.6 mg  0.6 mg Oral Daily Laroy Apple, MD   0.6 mg at 08/12/13 [id number]   . dextrose (GLUCOSE) 40 % oral gel 15 g  15 g Oral PRN Laroy Apple, MD       . dextrose 50 % bolus 25 mL  25 mL Intravenous PRN Laroy Apple, MD       . gabapentin (NEURONTIN) capsule 300 mg  300 mg Oral TID Laroy Apple, MD   300 mg at 08/13/13 [id number]   . glucagon (rDNA) (GLUCAGEN) injection 1 mg  1 mg Intramuscular PRN Laroy Apple, MD       . heparin (porcine) injection 3,000 Units  3,000 Units Intravenous PRN Noralyn Pick, MD   3,000 Units at 08/12/13 1330   .  heparin 25000 units in dextrose 5% 500 mL infusion (premix)  1,000 Units/hr Intravenous Continuous Noralyn Pick, MD 19 mL/hr at 08/13/13 1054 950 Units/hr at 08/13/13 1054   . insulin glargine (LANTUS) injection 12 Units  12 Units Subcutaneous QHS Laroy Apple, MD   12 Units at 08/12/13 2231   . insulin regular (HumuLIN R,NovoLIN R) injection 1-8 Units  1-8 Units Subcutaneous TID AC PRN Laroy Apple, MD   3 Units at 08/12/13 2231   . lidocaine (LIDODERM) 5 % 1 patch  1 patch Transdermal Q24H Quintin [place], Kittie Plater A, MD   1 patch at 08/11/13 1541   . lidocaine (LMX PLUS 4%) 4 % dressing   Topical PRN Alric Quan, MD       . lidocaine (LMX PLUS 4%) 4 % dressing   Topical PRN Alric Quan, MD       . pantoprazole (PROTONIX) EC tablet 40 mg  40 mg Oral QAM AC Laroy Apple, MD  40 mg at 08/13/13 0925   . pregabalin (LYRICA) capsule 75 mg  75 mg Oral Q12H [place] Laroy Apple, MD   75 mg at 08/13/13 [id number]   . simethicone (MYLICON) chewable tablet 80 mg  80 mg Oral Q6H PRN Noralyn Pick, MD   80 mg at 08/12/13 2057   . sodium chloride 0.9 % bolus 100 mL  100 mL Other Q1H PRN Alric Quan, MD       . sodium chloride 0.9 % bolus 250 mL  250 mL Intravenous PRN Alric Quan, MD       . traMADol Veatrice Bourbon) tablet 50 mg  50 mg Oral Q6H PRN Laroy Apple, MD   50 mg at 08/13/13 [id number]   . [COMPLETED] warfarin (COUMADIN) tablet 10 mg  10 mg Oral Once Isaac Bliss I, MD   10 mg at 08/12/13 1704   . warfarin (COUMADIN) tablet 7.5 mg  7.5 mg Oral Once Noralyn Pick, MD       . [DISCONTINUED] lidocaine (LMX PLUS 4%) 4 % dressing   Topical PRN Alric Quan, MD   1 each at 08/09/13 1400   . [DISCONTINUED] warfarin (COUMADIN) tablet 10 mg  10 mg Oral Once Noralyn Pick, MD       . [DISCONTINUED] warfarin (COUMADIN) tablet 5 mg  5 mg Oral Daily at [place], [place] A, MD   5 mg at 08/11/13  2121   . [DISCONTINUED] warfarin (COUMADIN) tablet 5 mg  5 mg Oral Daily at 1800 Elrufay, Rawiya I, MD         Allergies   Allergen Reactions   . Penicillins Hives   . Shrimp (Shellfish Allergy) Swelling     Blood pressure 103/59, pulse 85, temperature 97.1 F (36.2 C), temperature source Oral, resp. rate 17, height 1.626 m (5\' 4" ), weight 97.16 kg (214 lb 3.2 oz), SpO2 93.00%.    Subjective:  Symptoms:  She reports shortness of breath.    Diet:  Adequate intake.    Activity level: Impaired due to weakness.      Objective:  General Appearance:  In no acute distress.    Vital signs: (most recent): Blood pressure 103/59, pulse 85, temperature 97.1 F (36.2 C), temperature source Oral, resp. rate 17, height 1.626 m (5\' 4" ), weight 97.16 kg (214 lb 3.2 oz), SpO2 93.00%.    Output: No urine output and producing stool.    Lungs:  There are decreased breath sounds.    Heart: Normal rate.    Neurological: Patient is alert.    Skin:  Warm.    Abdomen: Abdomen is soft.      Results     Procedure Component Value Units Date/Time    Comprehensive metabolic panel [id number]  (Abnormal) Collected:08/13/13 0954    Specimen Information:Blood Updated:08/13/13 1023     Glucose 139 (H) mg/dL      BUN 34 (H) mg/dL      Creatinine 4.3 (H) mg/dL      Sodium 130 (L)      Potassium 4.6      Chloride 93 (L)      CO2 25      CALCIUM 9.0 mg/dL      Protein, Total 6.5 g/dL      Albumin 2.6 (L) g/dL      AST (SGOT) 28 U/L      ALT 35 U/L      Alkaline Phosphatase 77 U/L  Bilirubin, Total 0.5 mg/dL      Globulin 3.9 (H) g/dL      Albumin/Globulin Ratio 0.7 (L)      Anion Gap 12.0     Narrative:    For 6 hours  For 6 Hours    GFR [id number] Collected:08/13/13 0954     EGFR 12.4 Updated:08/13/13 1023    Narrative:    For 6 hours  For 6 Hours    APTT [id number]  (Abnormal) Collected:08/13/13 0954     PTT 77 (H) Updated:08/13/13 1012    Narrative:    For 6 hours  For 6 Hours    Prothrombin time/INR [id number] Collected:08/13/13 0954     PT  14.4 Updated:08/13/13 1009     PT INR 1.1      PT Anticoag. Given Within 48 hrs. Unknown     Narrative:    For 6 hours  For 6 Hours    CBC  [id number]  (Abnormal) Collected:08/13/13 0954    Specimen Information:Blood / Blood Updated:08/13/13 1002     WBC 9.83      RBC 3.06 (L)      Hgb 8.9 (L) g/dL      Hematocrit 26.4 (L) %      MCV 86.3 fL      MCH 29.1 pg      MCHC 33.7 g/dL      RDW 14 %      Platelets 251      MPV 11.0 fL      Nucleated RBC 0     Narrative:    For 6 hours  For 6 Hours    POCT glucose (AC and HS) [id number]  (Abnormal) Collected:08/13/13 0621     POCT Glucose WB 147 (A) mg/dL Updated:08/13/13 0621    POCT glucose (AC and HS) [id number]  (Abnormal) Collected:08/12/13 2154     POCT Glucose WB 204 (A) mg/dL Updated:08/12/13 2154    APTT [id number]  (Abnormal) Collected:08/12/13 1949     PTT 59 (H) Updated:08/12/13 2011     APTT Anticoag. Given w/i 48 hrs. heparin     POCT glucose (AC and HS) [id number]  (Abnormal) Collected:08/12/13 1656     POCT Glucose WB 191 (A) mg/dL Updated:08/12/13 1656    APTT [id number]  (Abnormal) Collected:08/12/13 1239     PTT 41 (H) Updated:08/12/13 1311     APTT Anticoag. Given w/i 48 hrs. heparin & war     POCT glucose (AC and HS) [id number]  (Abnormal) Collected:08/12/13 1153     POCT Glucose WB 253 (A) mg/dL Updated:08/12/13 1153      Echocardiogram Adult Complete W Clr/ Dopp Waveform    08/01/2013  impression: 1. Atrial septal aneurysm is present. 2. There is a small to moderate pericardial effusion noted both anteriorly and posteriorly.  this is only slightly increased from the previous echocardiogram dated 07/20/2013. Variation of tricuspid flow velocity is noted. There is no diastolic collapse of the right ventricle noted. There is organized material note noted in the apex of the right ventricle not previously reported. Small small pleural effusion is present. Clinical correlation is required considering these echocardiographic changes. 3. There are no  other significant echogram echocardiographic changes noted.  Lennon Alstrom, MD  08/01/2013 2:03 PM     Echocardiogram Adult Complete W Color Doppler Waveform    07/20/2013   Normal left ventricular systolic function. No significant valvular heart disease. Small pericardial effusion without evidence of cardiac tamponade.    Audria Nine, MD  07/20/2013 4:09 PM     Ct Abdomen Pelvis Wo Iv/ Wo Po Cont    07/20/2013   1. Interval development of a minimal to moderate pericardial effusion and trace pleural effusions since prior exam one day earlier. 2. No detectable acute abnormalities in the abdomen or pelvis. 3. Contrast residue gallbladder, kidneys, and bladder. 4. Numerous small low-attenuation kidney lesions. 5. Remainder as above. 6. Findings discussed with Dr. Beryle Lathe by Dr. Oren Binet at 3:59 AM.  Sheria Lang, MD  07/20/2013 8:23 AM     Xr Chest 2 Views    08/12/2013   1. No interval change in the bilateral pleural effusions and atelectasis.  Dennison Mascot, MD  08/12/2013 8:52 AM     Xr Chest 2 Views    08/10/2013   Small pleural effusions and basilar atelectasis, decreased from prior.  Loel Ro, MD  08/10/2013 10:22 AM     Ct Angio Chest    07/19/2013   1. No CT scan evidence for detectable pulmonary embolism. 2. Minimal cardiomegaly. 3. Minimal bibasilar lung atelectasis. 4. Remainder as above.  Sheria Lang, MD  07/19/2013 3:18 PM     Xr Chest Ap Portable    08/07/2013   Stable exam with small right pleural effusion and moderate left pleural effusion.  Loel Lofty. Anna Genre, MD  08/07/2013 9:38 AM     Chest Ap Portable    07/30/2013    1. New bilateral pleural effusions with bibasilar airspace opacities. 2. Stable cardiomegaly.  Darin Engels, MD  07/30/2013 4:33 PM     Xr Chest Ap Portable    07/19/2013   Hypoinflation with minimal basilar atelectasis.  Sheria Lang, MD  07/19/2013 2:45 PM     Assessment:  (1.chest pain  2.pleural effusion  3.viral pericarditis  4.ESRD  5.DM  6.MGUS  7.HTN  8.Left LE DVT  9.anemia).       Plan:    (1.Hemodialysis with UF today  2.aranesp sq q weekly  3.monitor serum electrolytes  4.on IV heparin and coumadin anticoagulation).       Alric Quan  08/13/2013

## 2013-08-13 NOTE — Plan of Care (Signed)
Problem: Pain  Goal: Patient's pain/discomfort is manageable  Outcome: Progressing  Patient reports unrelieved pain to left midsternal chest, nonradiating. Patient describes discomfort as excess gas, however with simethicone and protonix there is still no relief of symptoms.  Patient reports chest pressure worsens with deep breaths.  Reported this to Dr. Debroah Loop (hospitalist) and obtained order for maalox and percocet.  Administered percocet will continue to monitor for relief of pain. See also cardiology's and infectious disease's notes.     Problem: Renal Failure  Goal: Fluid and electrolyte balance are achieved/maintained  Outcome: Progressing  Need to take patient's weight every morning and measure urine output and oral intake.  Patient does not accurately report intake or output.  Patient had dialysis today and patient tolerated it without issues.  Norvasc held due to lower blood pressures.

## 2013-08-13 NOTE — Progress Notes (Signed)
Dialysis Treatment Note    Patient:  Mary [name] MRN#:  [id number]  Unit/Room/Bed:  [id number]   Isolation: None       Allergies:   Allergies   Allergen Reactions   . Penicillins Hives   . Shrimp (Shellfish Allergy) Swelling     Code Status: Full Code    Problem List:   Patient Active Problem List    Diagnosis Date Noted   . Dyspnea 07/30/2013   . Chest pain 07/30/2013   . Pleural effusion 07/30/2013   . Viral pericarditis 07/21/2013   . Chest pain at rest 07/19/2013   . Cellulitis of left leg 06/21/2013   . ESRD (end stage renal disease) 06/21/2013   . DM (diabetes mellitus) 06/21/2013   . Hypertension 06/21/2013   . Cellulitis 06/21/2013       Dialysis Order:  Active Orders   Dialysis    Hemodialysis inpatient     Frequency: Once     Start Date/Time: 08/13/13 0846     Number of Occurrences:  1 Occurrences     Order Questions:     . Date of Dialysis 08/13/2013     . K+ (Initial) 2 mEq     . KPP (Per Protocol) Yes     . Ca++ 2.5 mEq     . Bicarb 35 mEq     . Na+ 138 mEq     . Dialyzer F160     . Dialysate Temperature (C) 37     . BFR-As tolerated to a maximum of: 350 mL/min     . DFR 700 mL/min     . Duration of Treatment 3.5 Hours     . Fluid Removal (L) and Dry Weight (Kg) 2L (Comment: 1.5-2 as tol)     . Access Type-Location AVF-L        Dialysis Treatment Type:    Time out/Safety Check: Time Out/Safety Check Completed: Yes (08/13/13 0950)  [facility] Consent: Consent for HD signed for this hospitalization: Yes (08/13/13 0950)  Blood Consent (if applicable): Blood Consent Verified: Not Applicable ([id number])    Dialysis Access:      Graft/Fistula Left (Active)   Fistula/ Graft Assessment Abnormalities WDL 08/13/2013  1:35 PM   Needle Size 15 Gauge 08/13/2013  1:35 PM   Cannulation Sites held Arterial (min) 5 08/13/2013  1:35 PM   Cannulation Sites held Venous (min) 5 08/13/2013  1:35 PM   Hemostasis Achieved Yes 08/13/2013  1:35 PM       General Assessments:     08/13/13 1335   Treatment Summary   Time  Off Machine 1320   Duration of Treatment (Hours) 3.5   Dialyzer Clearance Lightly streaked   Fluid Volume Off (mL) 2000    Prime Volume (mL) 200    Rinseback Volume (mL) 200    Fluid Given: Normal Saline (mL) 300    Fluid Given: PRBC  0 mL   Fluid Given: Albumin (mL) 0    Fluid Given: Other (mL) 0    Total Fluid Given 700    Hemodialysis Net Fluid Removed 1300    Post Treatment Assessment   Patient Response to Treatment pt remained sleepy during HD, post receiving pain med for abdominal discomfort, lowered goal per hypotension   Information for Next Treatment on heparin drip, no heparin used this tx   Graft/Fistula Left   No Placement Date or Time found.   Present on Admission?: Yes  Orientation: Left  Graft/Fistula Location: Upper  Arm   Fistula/ Graft Assessment Abnormalities WDL   Needle Size 15 Gauge   Cannulation Sites held Arterial (min) 5   Cannulation Sites held Venous (min) 5   Hemostasis Achieved Yes   Vitals   Temp 97.2 F (36.2 C)   Heart Rate 90    BP 124/56 mmHg   O2 Device Nasal cannula   Assessment   Mental Status Oriented;Cooperative   Cardiac Regularity Regular   Cardiac Symptoms None   Cardiac Rhythm Normal Sinus Rhythm   Respiratory Pattern Regular;Easy   Bilateral Breath Sounds Clear   Generalized Edema Non Pitting Edema   General Skin Color Appropriate for ethnicity   Skin Condition/Temp Warm;Dry   GI Symptoms Gas   Mobility Ambulatory   Education   Person taught Patient   Knowledge basis Substantial   Topics taught Procedure   Teaching Tools Explain   Bedside Nurse Communication   Name of bedside RN - pre dialysis Gilmore Laroche   Name of bedside RN - post dialysis Gilmore Laroche      08/13/13 0950   Bedside Nurse Communication   Name of bedside RN - pre dialysis Tricia   Treatment Initiation- With Dialysis Precautions   Time Out/Safety Check Completed Yes   Consent for HD signed for this hospitalization Y   Blood Consent Verified N/A   Dialysis Precautions All Connections Secured;Saline Line Double  Clamped;Venous Parameters Set;Arterial Parameters Set;Air Foam Detecctor Engaged   Dialysis Treatment Type Routine;Bedside   Special Considerations heparin drip    Is patient diabetic? Yes   RO/Hemodialysis Architectural technologist   Is Total Chlorine less than 0.1 ppm? Yes   Orignial Total Chlorine Testing Time 0845   RO/Hemodialysis Archivist Number 2    RO # 2    Water Hardness 0    pH 7.6    Pressure Test Verified Yes   Alarms Verified Passed   Machine Temperature 98.6 F (37 C)   Alarms Verified Yes   Hemodialysis Conductivity (Machine) 13.7    Hemodialysis Conductivity (Meter) 13.6    Dialyzer Lot Number [id number])   Tubing Lot Number [id number])   RO Machine Log Completed Yes   Hepatitis Status   HBsAg (Antigen) Result Negative   HBsAg Date Drawn 08/07/13   HBsAb (Antibody) Result Non-Reactive   Dialysis Weight   Pre-Treatment Weight (Kg) 92    Scale Type In Bed Sling Scale   Vitals   Temp 97.1 F (36.2 C)   Heart Rate 82    BP 113/67 mmHg   O2 Device Nasal cannula   Assessment   Mental Status Alert;Oriented;Cooperative   Cardiac Regularity Regular   Cardiac Rhythm Normal Sinus Rhythm   Respiratory  WDL   Respiratory Pattern Regular;Easy   Bilateral Breath Sounds Clear   Edema  X   Generalized Edema +1   RLE Edema +1   LLE Edema +1   General Skin Color Appropriate for ethnicity   Skin Condition/Temp Warm;Dry   GI Symptoms Bloating;Gas   Mobility Ambulatory   Graft/Fistula Left   No Placement Date or Time found.   Present on Admission?: Yes  Orientation: Left  Graft/Fistula Location: Upper Arm   Fistula/ Graft Assessment Abnormalities WDL   Needle Size 15 Gauge   Pain Assessment   Charting Type Assessment   Pain Scale Used Numeric Scale (0-10)   Numeric Pain Scale   Pain Score 3   Pain Location Abdomen   Pain Descriptors Discomfort   Pain Intervention(s)  Medication   Hemodialysis Comments   Pre-Hemodialysis Comments pt requested pain medication pre dialysis per gas pains   Hemodialysis  History Information   Outpatient Dialysis Unit [facility]    Outpatient Dialysis Schedule TTS   Outpatient Nephrologist Ikhinmwin              Pain Assessment: Pain Assessment  Charting Type: Assessment (08/13/13 0950)  Pain Scale Used: Numeric Scale (0-10) (08/13/13 0950)    Labs Values:    HBsAg Result:HBsAg (Antigen) Result: Negative (08/13/13 0950)  HBsAg Date Drawn: HBsAg Date Drawn: 08/07/13 (08/13/13 0950)  HBsAg Next Due Date:HBsAg Repeat Draw Due Date: 09/04/13 (08/09/13 1500)    Lab Results   Component Value Date    BUN 34* 08/13/2013    NA 130* 08/13/2013    K 4.6 08/13/2013    CREAT 4.3* 08/13/2013    CO2 25 08/13/2013    CA 9.0 08/13/2013    PHOS 4.6 08/11/2013    GLU 139* 08/13/2013    ALB 2.6* 08/13/2013    HGB 8.9* 08/13/2013    HCT 26.4* 08/13/2013    WBC 9.83 08/13/2013    PLT 251 08/13/2013    PTT 77* 08/13/2013    PT 14.4 08/13/2013    INR 1.1 08/13/2013         Current Diet Order  Diet cardiac Fluid restriction:: 1500 ML FLUID; Na restriction, if any:: 2 GM NA     RO/Hemodialysis Machine Safety Checks - Before Each Treatment:  RO/Hemodialysis Archivist Number: 2  (08/13/13 0950)  RO #: 2  (08/13/13 0950)  Water Hardness: 0  (08/13/13 0950)  pH: 7.6  (08/13/13 0950)  Pressure Test Verified: Yes (08/13/13 0950)  Alarms Verified: Passed (08/13/13 0950)  Machine Temperature: 98.6 F (37 C) (08/13/13 0950)  Alarms Verified: Yes (08/13/13 0950)  Hemodialysis Conductivity (Machine): 13.7  (08/13/13 0950)  Hemodialysis Conductivity (Meter): 13.6  (08/13/13 0950)  RO Machine Log Completed: Yes (08/13/13 0950)    Chlorine Testing:  RO/Hemodialysis Machine Safety Checks  Is Total Chlorine less than 0.1 ppm?: Yes (08/13/13 0950)  Orignial Total Chlorine Testing Time: 0845 (08/13/13 0950)  At 4 Hour Total Chlorine Testing Time: [id number] (08/11/13 1419)    Vitals and Weight:  Patient Vitals for the past 4 hrs:   BP Temp Pulse   08/13/13 1335 124/56 mmHg 97.2 F (36.2 C) 90    08/13/13 1309 110/82  mmHg - 87    08/13/13 1305 81/55 mmHg - 85    08/13/13 1250 102/51 mmHg - 85    08/13/13 1235 106/58 mmHg - 85    08/13/13 1219 104/57 mmHg - 86    08/13/13 1205 92/54 mmHg - 83    08/13/13 1149 99/59 mmHg - 82    08/13/13 1134 96/58 mmHg - 84    08/13/13 1121 110/59 mmHg - 83    08/13/13 1108 123/63 mmHg - 83    08/13/13 1058 103/59 mmHg - 85    08/13/13 1051 103/59 mmHg - 90    08/13/13 1035 110/56 mmHg - 84    08/13/13 1020 111/57 mmHg - 83    08/13/13 1007 103/62 mmHg - 84    08/13/13 0950 113/67 mmHg 97.1 F (36.2 C) 82         Dialysis Weight  Pre-Treatment Weight (Kg): 92  (08/13/13 0950)  Scale Type: In Bed Sling Scale (08/13/13 0950)  Post-Treatment Weight (Kg): 213.7  (08/09/13 1845)    Treatment:  08/13/13 1250 08/13/13 1305 08/13/13 1309   Vitals   Heart Rate 85  85  87    BP 102/51 mmHg ! 81/55 mmHg 110/82 mmHg   Machine Metrics   Blood Flow Rate (mL/min) 350 mL/min 350 mL/min 350 mL/min   Arterial Pressure (mmHg) -154 mmHg -153 mmHg -151 mmHg   Venous Pressure (mmHg) 107  112  105    Dialysate Flow Rate (mL/min) 700 mL/min 700 mL/min 700 mL/min   Transmembrane Pressure (mmHg) 75 mmHg 69 mmHg 71 mmHg   Ultrafiltration Rate (mL/Hr) 360 mL/hr 400 mL/hr 400 mL/hr   Fluid Removal (ml) 1803 ml 1908 ml 1932 ml   Fluid Bolus (ml) --  200 ml --    Hemodialysis Comments   Arteriovenous Lines Secure Yes Yes --    Comments --  ns bp support bp recheck      08/13/13 1134 08/13/13 1149 08/13/13 1205   Vitals   Heart Rate 84  82  83    BP 96/58 mmHg 99/59 mmHg 92/54 mmHg   Machine Metrics   Blood Flow Rate (mL/min) 350 mL/min 350 mL/min 350 mL/min   Arterial Pressure (mmHg) -154 mmHg -155 mmHg -158 mmHg   Venous Pressure (mmHg) 107  106  105    Dialysate Flow Rate (mL/min) 700 mL/min 700 mL/min 700 mL/min   Transmembrane Pressure (mmHg) 93 mmHg 91 mmHg 91 mmHg   Ultrafiltration Rate (mL/Hr) 690 mL/hr 690 mL/hr 690 mL/hr   Fluid Removal (ml) 1189 ml 1360 ml 1534 ml   Fluid Bolus (ml) 100 ml --  --    Hemodialysis  Comments   Arteriovenous Lines Secure Yes Yes Yes   Comments flushed lines  --  lowered goal per bp        08/13/13 1219 08/13/13 1235   Vitals   Heart Rate 86  85    BP 104/57 mmHg 106/58 mmHg   Machine Metrics   Blood Flow Rate (mL/min) 350 mL/min 350 mL/min   Arterial Pressure (mmHg) -155 mmHg -156 mmHg   Venous Pressure (mmHg) 111  106    Dialysate Flow Rate (mL/min) 700 mL/min 700 mL/min   Transmembrane Pressure (mmHg) 80 mmHg 76 mmHg   Ultrafiltration Rate (mL/Hr) 360 mL/hr 360 mL/hr   Fluid Removal (ml) 1625 ml 1717 ml   Fluid Bolus (ml) --  --    Hemodialysis Comments   Arteriovenous Lines Secure Yes Yes   Comments pt sleeping, responsive  --       08/13/13 1051 08/13/13 1058 08/13/13 1108   Vitals   Heart Rate 90  85  83    BP 103/59 mmHg 103/59 mmHg 123/63 mmHg   Machine Metrics   Blood Flow Rate (mL/min) 350 mL/min 350 mL/min 350 mL/min   Arterial Pressure (mmHg) -148 mmHg -151 mmHg -150 mmHg   Venous Pressure (mmHg) 106  106  110    Dialysate Flow Rate (mL/min) 700 mL/min 700 mL/min 700 mL/min   Transmembrane Pressure (mmHg) 93 mmHg 93 mmHg 90 mmHg   Ultrafiltration Rate (mL/Hr) 690 mL/hr 690 mL/hr 690 mL/hr   Fluid Removal (ml) 696 ml 778 ml 904 ml   Dialysate K (mEq/L) 2 mEq/L --  2 mEq/L   Dialysate CA (mEq/L) 2.5 mEq/L --  2.5 mEq/L   Hemodialysis Comments   Arteriovenous Lines Secure Yes Yes Yes   Comments --  RN's at bedside, adjusting heparin drip per protocol  Dr Leonette Most at bedside  08/13/13 1121   Vitals   Heart Rate 83    BP 110/59 mmHg   Machine Metrics   Blood Flow Rate (mL/min) 350 mL/min   Arterial Pressure (mmHg) -152 mmHg   Venous Pressure (mmHg) 114    Dialysate Flow Rate (mL/min) 700 mL/min   Transmembrane Pressure (mmHg) 81 mmHg   Ultrafiltration Rate (mL/Hr) 690 mL/hr   Fluid Removal (ml) 1046 ml   Dialysate K (mEq/L) --    Dialysate CA (mEq/L) --    Hemodialysis Comments   Arteriovenous Lines Secure Yes   Comments pt sleeping, responsive       08/13/13 0950 08/13/13 1007  08/13/13 1020   Vitals   Temp 97.1 F (36.2 C) --  --    Heart Rate 82  84  83    BP 113/67 mmHg 103/62 mmHg 111/57 mmHg   O2 Device Nasal cannula --  --    Journalist, newspaper Yes --  --    Blood Flow Rate (mL/min) 350 mL/min 350 mL/min 350 mL/min   Arterial Pressure (mmHg) -130 mmHg -142 mmHg -145 mmHg   Venous Pressure (mmHg) 100  100  105    Dialysate Flow Rate (mL/min) 700 mL/min 700 mL/min 700 mL/min   Transmembrane Pressure (mmHg) 90 mmHg 92 mmHg 91 mmHg   Ultrafiltration Rate (mL/Hr) 690 mL/hr 690 mL/hr 690 mL/hr   Fluid Removal (ml) 0 ml 200 ml 348 ml   Dialysate K (mEq/L) 2 mEq/L 2 mEq/L 2 mEq/L   Dialysate CA (mEq/L) 2.5 mEq/L 2.5 mEq/L 2.5 mEq/L   Hemodialysis Comments   Arteriovenous Lines Secure Yes Yes Yes   Comments tx started without diffiulty, labs drawn off fistula   pt resting, talking  --        08/13/13 1035   Vitals   Temp --    Heart Rate 84    BP 110/56 mmHg   O2 Device --    Journalist, newspaper --    Blood Flow Rate (mL/min) 350 mL/min   Arterial Pressure (mmHg) -149 mmHg   Venous Pressure (mmHg) 102    Dialysate Flow Rate (mL/min) 700 mL/min   Transmembrane Pressure (mmHg) 92 mmHg   Ultrafiltration Rate (mL/Hr) 690 mL/hr   Fluid Removal (ml) 517 ml   Dialysate K (mEq/L) 2 mEq/L   Dialysate CA (mEq/L) 2.5 mEq/L   Hemodialysis Comments   Arteriovenous Lines Secure Yes   Comments pt sleeping, responsive             Intake/Output:  I/O this shift:  In: -   Out: 1300 [Other:1300]      Medications:  Current Facility-Administered Medications   Medication Dose Route Last Rate Last Dose   . sodium chloride 0.9 % bolus 100 mL  100 mL Other       . sodium chloride 0.9 % bolus 250 mL  250 mL Intravenous            Education:  Education  Person taught: Patient (08/13/13 1335)  Knowledge basis: Substantial (08/13/13 1335)  Topics taught: Procedure (08/13/13 1335)  Teaching Tools: Explain (08/13/13 1335)    Primary Nurse  Communication:  Bedside Nurse Communication  Name of bedside RN - pre dialysis: Gilmore Laroche (08/13/13 1335)  Name of bedside RN - post dialysis: Tricia (08/13/13 1335)      [facility] nurse signature/date/time: Reynaldo Minium 08/13/13 1340

## 2013-08-14 LAB — BASIC METABOLIC PANEL
Anion Gap: 10 (ref 5.0–15.0)
BUN: 24 mg/dL — ABNORMAL HIGH (ref 7–19)
CO2: 26 (ref 22–29)
Calcium: 8.7 mg/dL (ref 8.5–10.5)
Chloride: 93 — ABNORMAL LOW (ref 98–107)
Creatinine: 3.6 mg/dL — ABNORMAL HIGH (ref 0.6–1.0)
Glucose: 200 mg/dL — ABNORMAL HIGH (ref 70–100)
Potassium: 4.6 (ref 3.5–5.1)
Sodium: 129 — ABNORMAL LOW (ref 136–145)

## 2013-08-14 LAB — APTT
PTT: 68 — ABNORMAL HIGH (ref 23–37)
PTT: 66 — ABNORMAL HIGH (ref 23–37)

## 2013-08-14 LAB — CBC AND DIFFERENTIAL
Basophils Absolute Automated: 0.03 (ref 0.00–0.20)
Basophils Automated: 0 %
Eosinophils Absolute Automated: 0.11 (ref 0.00–0.70)
Eosinophils Automated: 1 %
Hematocrit: 24.7 % — ABNORMAL LOW (ref 37.0–47.0)
Hgb: 8.1 g/dL — ABNORMAL LOW (ref 12.0–16.0)
Immature Granulocytes Absolute: 0.02
Immature Granulocytes: 0 %
Lymphocytes Absolute Automated: 1.98 (ref 0.50–4.40)
Lymphocytes Automated: 21 %
MCH: 28.9 pg (ref 28.0–32.0)
MCHC: 32.8 g/dL (ref 32.0–36.0)
MCV: 88.2 fL (ref 80.0–100.0)
MPV: 11.7 fL (ref 9.4–12.3)
Monocytes Absolute Automated: 1.06 (ref 0.00–1.20)
Monocytes: 11 %
Neutrophils Absolute: 6.14 (ref 1.80–8.10)
Neutrophils: 66 %
Nucleated RBC: 0 (ref 0–1)
Platelets: 235 (ref 140–400)
RBC: 2.8 — ABNORMAL LOW (ref 4.20–5.40)
RDW: 14 % (ref 12–15)
WBC: 9.32 (ref 3.50–10.80)

## 2013-08-14 LAB — PT/INR
PT INR: 1.3 — ABNORMAL HIGH (ref 0.9–1.1)
PT: 15.6 — ABNORMAL HIGH (ref 12.6–15.0)

## 2013-08-14 LAB — GFR: EGFR: 15.2

## 2013-08-14 LAB — POCT GLUCOSE
Whole Blood Glucose POCT: 185 mg/dL — AB (ref 70–100)
Whole Blood Glucose POCT: 228 mg/dL — AB (ref 70–100)
Whole Blood Glucose POCT: 208 mg/dL — AB (ref 70–100)
Whole Blood Glucose POCT: 165 mg/dL — AB (ref 70–100)

## 2013-08-14 MED ORDER — WARFARIN SODIUM 7.5 MG PO TABS
7.5000 mg | ORAL_TABLET | Freq: Every day | ORAL | Status: DC
Start: 2013-08-14 — End: 2013-08-19
  Administered 2013-08-14 – 2013-08-18 (×5): 7.5 mg via ORAL
  Filled 2013-08-14 (×5): qty 1

## 2013-08-14 NOTE — Plan of Care (Signed)
Problem: Safety  Goal: Patient will be free from injury during hospitalization  Outcome: Progressing  Pt safety to be maintained.  Nurse call bell and phone within pt reach.  Pt ambulates safely in room independently for 3 days.

## 2013-08-14 NOTE — Plan of Care (Signed)
Problem: Pain  Goal: Patient's pain/discomfort is manageable  Outcome: Progressing  Pt pain level to be maintained at a level 3.  Previous night RN stated one percocet worked well for pain control.

## 2013-08-14 NOTE — Progress Notes (Signed)
[facility]  HOSPITALIST  PROGRESS NOTE      Patient: Mary [name]  Date: 08/14/2013   LOS: 7 Days  Admission Date: 08/07/2013   MRN: [id number]  Attending: Kelli Churn MD     ASSESSMENT/PLAN   Mary [name] is a 67 y.o. female with hx of recent viral pericarditis, diastolic congestive heart failure, small to moderate pericardial effusion and bilat pleural effusions, ESRD on HD, DM, HTN, HLP, LLE DVT and diabetic nephropathy,admitted with chest pain and SOB.     1. Chest pain - multifactorial, atypical. Possibly musculoskeletal because the pain is reproducible. Pt was seen by Cardiology, who thought the pain could also be from recent pericarditis and or fluid overload   Plan: Percocet given for pain today. Also gave pepcid and maalox because pt was complaining of gas pain   - c/w aspirin 81 mg pO daily, atorvastatin, and pantoprazole   - c/w colchicine for recent pericarditis   2. ESRD on HD - pt appears fluid overloaded in the setting of ESRD. pt received HD today,  Plan: - appreciate Renal consult, pt follows with Dr. Gaspar Garbe   4. Recent DVT with subtherapeutic INR- pt is on heparin gtt, bridging to coumadin. Cannot receive lovenox because of ESRD. INR 1.3 today  Plan: check INR daily, Coumadin 7.5 mg PO QHS tonight   5. Normocytic anemia - likely anemia of chronic disease in the setting of ESRD, pt on aranesp weekly   6. Bilateral pleural effusions with small to moderate pericardial effusion - seen on TTE on 8/11 - slightly increased from the previous echo from 7/30. Pt has hx of recent pericarditis treated with ibuprofen and now colchicine , CXR shows stability of effusions  7. DM - c/w Lantus and insulin sliding scale , continue gabapentin and lyrica for diabetic neuropathy  8. Atrial septal aneurysm seen on TTE   9. Hypertension - hypotensive yesterday, c/w amlodopine to 2.5 mg PO daily  10. Acute on chronic dyastolic CHF in the setting of ESRD - c/w dialysis for fluid overload management  11. HLD -  atorvastatin   12. Hyponatremia     DVT PPX: already on heparin gtt   Diet: cardiac diet, fluid restriction   Code: Full code   Dispo: once INR therapeutic, heparin gtt --> coumadin    SUBJECTIVE   No overnight events. Currently no chest pain and no significant SOB. Pt comfortable sitting upright in chair. Answered questions of family. No fevers, chills or other complaints.    MEDICATIONS     Current Facility-Administered Medications   Medication Dose Route Frequency   . amLODIPine  2.5 mg Oral Daily   . aspirin  81 mg Oral Daily   . atorvastatin  10 mg Oral Daily   . b complex-vitamin c-folic acid  1 tablet Oral Daily   . bisacodyl  5 mg Oral Daily   . colchicine  0.6 mg Oral Daily   . [COMPLETED] famotidine  20 mg Oral Once   . gabapentin  300 mg Oral TID   . insulin glargine  12 Units Subcutaneous QHS   . lidocaine  1 patch Transdermal Q24H   . pantoprazole  40 mg Oral QAM AC   . pregabalin  75 mg Oral Q12H [place]   . [COMPLETED] warfarin  7.5 mg Oral Once   . warfarin  7.5 mg Oral Daily at 1800       ROS     Remainder of 10 point ROS as above or  otherwise negative    PHYSICAL EXAM     Filed Vitals:    08/14/13 1152   BP: 119/78   Pulse: 98   Temp: 98.1 F (36.7 C)   Resp: 18   SpO2: 97%       Temperature: Temp  Min: 97.9 F (36.6 C)  Max: 98.8 F (37.1 C)  Pulse: Pulse  Min: 88   Max: 104   Respiratory: Resp  Min: 16   Max: 18   Non-Invasive BP: BP  Min: 111/58  Max: 173/88  Pulse Oximetry SpO2  Min: 92 %  Max: 97 %    Intake and Output Summary (Last 24 hours) at Date Time    Intake/Output Summary (Last 24 hours) at 08/14/13 1618  Last data filed at 08/13/13 1900   Gross per 24 hour   Intake     50 ml   Output      0 ml   Net     50 ml       GEN APPEARANCE: Normal;  A&OX3  HEENT: PERLA; EOMI; Conjunctiva Clear  NECK: Supple; No bruits  CVS: RRR, S1, S2; No M/G/R  LUNGS: CTAB; decreased breath sounds bases of lungs   ABD: Soft; No TTP; + Normoactive BS  EXT: No edema; Pulses 2+ and intact  SKIN: No rash or  Lesions  NEURO: CN 2-12 intact; No Focal neurological deficits      LABS       Lab 08/14/13 0526 08/13/13 0954 08/11/13 0303   WBC 9.32 9.83 7.84   RBC 2.80* 3.06* 2.64*   HGB 8.1* 8.9* 7.8*   HCT 24.7* 26.4* 23.3*   MCV 88.2 86.3 88.3   PLT 235 251 247         Lab 08/14/13 0526 08/13/13 0954 08/11/13 0834 08/09/13 1530 08/08/13 0645   NA 129* 130* 137 131* 138   K 4.6 4.6 4.1 4.3 4.2   CL 93* 93* 100 96* 100   CO2 26 25 27 23 29    BUN 24* 34* 31* 39* 22*   CREAT 3.6* 4.3* 4.6* 5.1* 4.1*   GLU 200* 139* 133* 325* 168*   CA 8.7 9.0 9.0 8.7 8.7   MG -- -- -- -- --         Lab 08/13/13 0954 08/11/13 0834 08/09/13 1530   ALT 35 -- --   AST 28 -- --   GGT -- -- --   BILITOTAL 0.5 -- --   BILIDIRECT -- -- --   ALB 2.6* 2.4* 2.5*   ALKPHOS 77 -- --         Lab 08/08/13 0127 08/07/13 1928   CK 27* 31   CKMB -- --   CKMBINDEX -- --   TROPI <0.01 <0.01         Lab 08/14/13 1157 08/14/13 0526 08/13/13 1706 08/13/13 0954 08/12/13 0331   INR -- 1.3* -- 1.1 1.0   PT -- 15.6* -- 14.4 13.0   PTT 66* 68* 71* -- --         RADIOLOGY     Radiological Procedure reviewed.    XR CHEST AP PORTABLE    Final Result:      Stable exam with small right pleural effusion and moderate left pleural    effusion.        Loel Lofty. Anna Genre, MD     08/07/2013 9:38 AM   XR CHEST 2 VIEWS    Final Result:  Small pleural  effusions and basilar atelectasis, decreased     from prior.        Loel Ro, MD     08/10/2013 10:22 AM   XR CHEST 2 VIEWS    Final Result:      1. No interval change in the bilateral pleural effusions and    atelectasis.        Dennison Mascot, MD     08/12/2013 8:52 AM       Signed,  Kelli Churn MD  4:18 PM 08/14/2013

## 2013-08-14 NOTE — Progress Notes (Signed)
Mary [name] is a 67 y.o. female patient with PMH notable for  DM, HTN, ESRD on hemodialysis, admitted because of SOB.    Active Problems:   Chest pain    Past Medical History   Diagnosis Date   . Diabetes mellitus without complication    . Hyperlipidemia    . Hypertensive disorder    . Chronic kidney disease      dialysis t-tr-sat   . Arthritis    . Cataracts, bilateral    . GERD (gastroesophageal reflux disease)    . Acute deep vein thrombosis (DVT) of distal vein of left lower extremity    . Hemodialysis access site with mature fistula      Current Facility-Administered Medications   Medication Dose Route Frequency Provider Last Rate Last Dose   . acetaminophen (TYLENOL) tablet 650 mg  650 mg Oral Q6H PRN Laroy Apple, MD       . alum & mag hydroxide-simethicone (MAALOX PLUS) 200-200-20 mg/5 mL suspension 30 mL  30 mL Oral Q4H PRN Noralyn Pick, MD       . [facility]) tablet 2.5 mg  2.5 mg Oral Daily Noralyn Pick, MD   2.5 mg at 08/14/13 [id number]   . aspirin chewable tablet 81 mg  81 mg Oral Daily Laroy Apple, MD   81 mg at 08/14/13 [id number]   . atorvastatin (LIPITOR) tablet 10 mg  10 mg Oral Daily Laroy Apple, MD   10 mg at 08/13/13 2006   . b complex-vitamin c-folic acid (NEPHRO-VITE) tablet 1 tablet  1 tablet Oral Daily Laroy Apple, MD   1 tablet at 08/14/13 [phone]   . bisacodyl (DULCOLAX) EC tablet 5 mg  5 mg Oral Daily Noralyn Pick, MD   5 mg at 08/14/13 [id number]   . colchicine tablet 0.6 mg  0.6 mg Oral Daily Laroy Apple, MD   0.6 mg at 08/14/13 [id number]   . dextrose (GLUCOSE) 40 % oral gel 15 g  15 g Oral PRN Laroy Apple, MD       . dextrose 50 % bolus 25 mL  25 mL Intravenous PRN Laroy Apple, MD       . Margrett Rud famotidine (PEPCID) tablet 20 mg  20 mg Oral Once Noralyn Pick, MD   20 mg at 08/13/13 2006   . gabapentin (NEURONTIN) capsule 300 mg  300 mg Oral TID Laroy Apple, MD   300 mg at 08/14/13 [id number]   . glucagon (rDNA) (GLUCAGEN) injection 1 mg  1 mg Intramuscular PRN Laroy Apple, MD       . heparin (porcine) injection 3,000 Units  3,000 Units Intravenous PRN Noralyn Pick, MD   3,000 Units at 08/12/13 1330   . heparin 25000 units in dextrose 5% 500 mL infusion (premix)  1,000 Units/hr Intravenous Continuous Noralyn Pick, MD 20 mL/hr at 08/14/13 0649 1,000 Units/hr at 08/14/13 0649   . insulin glargine (LANTUS) injection 12 Units  12 Units Subcutaneous QHS Laroy Apple, MD   12 Units at 08/13/13 2127   . insulin regular (HumuLIN R,NovoLIN R) injection 1-8 Units  1-8 Units Subcutaneous TID AC PRN Laroy Apple, MD   1 Units at 08/14/13 [phone]   . lidocaine (LIDODERM) 5 % 1 patch  1 patch Transdermal Q24H Quintin Alto, Kittie Plater A, MD   1 patch at 08/11/13 1541   . lidocaine (LMX PLUS 4%) 4 % dressing  Topical PRN Alric Quan, MD       . lidocaine (LMX PLUS 4%) 4 % dressing   Topical PRN Alric Quan, MD       . oxyCODONE-acetaminophen (PERCOCET) 5-325 MG per tablet 2 tablet  2 tablet Oral Q6H PRN Noralyn Pick, MD   1 tablet at 08/14/13 0227   . pantoprazole (PROTONIX) EC tablet 40 mg  40 mg Oral QAM AC Laroy Apple, MD   40 mg at 08/14/13 [id number]   . pregabalin (LYRICA) capsule 75 mg  75 mg Oral Q12H [place] Laroy Apple, MD   75 mg at 08/14/13 [id number]   . simethicone (MYLICON) chewable tablet 80 mg  80 mg Oral Q6H PRN Noralyn Pick, MD   80 mg at 08/13/13 1452   . [EXPIRED] sodium chloride 0.9 % bolus 100 mL  100 mL Other Q1H PRN Alric Quan, MD       . [EXPIRED] sodium chloride 0.9 % bolus 250 mL  250 mL Intravenous PRN Alric Quan, MD       . traMADol Veatrice Bourbon) tablet 50 mg  50 mg Oral Q6H PRN Laroy Apple, MD   50 mg at 08/13/13 2114   . [COMPLETED] warfarin (COUMADIN) tablet 7.5 mg  7.5 mg Oral Once Noralyn Pick,  MD   7.5 mg at 08/13/13 2006   . [DISCONTINUED] amLODIPine (NORVASC) tablet 5 mg  5 mg Oral Daily Laroy Apple, MD   5 mg at 08/12/13 0935     Allergies   Allergen Reactions   . Penicillins Hives   . Shrimp (Shellfish Allergy) Swelling     Blood pressure 173/88, pulse 96, temperature 98 F (36.7 C), temperature source Oral, resp. rate 16, height 1.626 m (5\' 4" ), weight 97.16 kg (214 lb 3.2 oz), SpO2 94.00%.    Subjective:  Symptoms:  She reports shortness of breath.    Diet:  Adequate intake.    Activity level: Impaired due to weakness.      Objective:  General Appearance:  In no acute distress.    Vital signs: (most recent): Blood pressure 173/88, pulse 96, temperature 98 F (36.7 C), temperature source Oral, resp. rate 16, height 1.626 m (5\' 4" ), weight 97.16 kg (214 lb 3.2 oz), SpO2 94.00%.    Output: No urine output and producing stool.    Lungs:  There are decreased breath sounds.    Heart: Normal rate.    Neurological: Patient is alert.    Skin:  Warm.    Abdomen: Abdomen is soft.      Results     Procedure Component Value Units Date/Time    Basic Metabolic Panel [id number]  (Abnormal) Collected:08/14/13 0526    Specimen Information:Blood Updated:08/14/13 0648     Glucose 200 (H) mg/dL      BUN 24 (H) mg/dL      Creatinine 3.6 (H) mg/dL      CALCIUM 8.7 mg/dL      Sodium 129 (L)      Potassium 4.6      Chloride 93 (L)      CO2 26      Anion Gap 10.0     GFR [id number] Collected:08/14/13 0526     EGFR 15.2 Updated:08/14/13 0648    APTT [id number]  (Abnormal) Collected:08/14/13 0526     PTT 68 (H) Updated:08/14/13 0624    Prothrombin time/INR [id number]  (Abnormal) Collected:08/14/13 0526    Specimen Information:Blood Updated:08/14/13 [id number]  PT 15.6 (H)      PT INR 1.3 (H)      PT Anticoag. Given Within 48 hrs. heparin     CBC and differential [id number]  (Abnormal) Collected:08/14/13 0526    Specimen Information:Blood / Blood Updated:08/14/13 0616     WBC 9.32      RBC 2.80 (L)      Hgb 8.1  (L) g/dL      Hematocrit 24.7 (L) %      MCV 88.2 fL      MCH 28.9 pg      MCHC 32.8 g/dL      RDW 14 %      Platelets 235      MPV 11.7 fL      Neutrophils 66 %      Lymphocytes Automated 21 %      Monocytes 11 %      Eosinophils Automated 1 %      Basophils Automated 0 %      Immature Granulocyte 0 %      Nucleated RBC 0      Neutrophils Absolute 6.14      Abs Lymph Automated 1.98      Abs Mono Automated 1.06      Abs Eos Automated 0.11      Absolute Baso Automated 0.03      Absolute Immature Granulocyte 0.02     POCT glucose (AC and HS) [id number]  (Abnormal) Collected:08/14/13 0513     POCT Glucose WB 185 (A) mg/dL Updated:08/14/13 0513    POCT glucose (AC and HS) [id number]  (Abnormal) Collected:08/13/13 2120     POCT Glucose WB 200 (A) mg/dL Updated:08/13/13 2120    APTT [id number]  (Abnormal) Collected:08/13/13 1706     PTT 71 (H) Updated:08/13/13 1722     APTT Anticoag. Given w/i 48 hrs. heparin & war     POCT glucose (AC and HS) [id number]  (Abnormal) Collected:08/13/13 1656     POCT Glucose WB 157 (A) mg/dL Updated:08/13/13 1656    POCT glucose (AC and HS) [id number]  (Abnormal) Collected:08/13/13 1126     POCT Glucose WB 110 (A) mg/dL Updated:08/13/13 1126      Echocardiogram Adult Complete W Clr/ Dopp Waveform    08/01/2013  impression: 1. Atrial septal aneurysm is present. 2. There is a small to moderate pericardial effusion noted both anteriorly and posteriorly.  this is only slightly increased from the previous echocardiogram dated 07/20/2013. Variation of tricuspid flow velocity is noted. There is no diastolic collapse of the right ventricle noted. There is organized material note noted in the apex of the right ventricle not previously reported. Small small pleural effusion is present. Clinical correlation is required considering these echocardiographic changes. 3. There are no other significant echogram echocardiographic changes noted.  Lennon Alstrom, MD  08/01/2013 2:03 PM     Echocardiogram  Adult Complete W Color Doppler Waveform    07/20/2013   Normal left ventricular systolic function. No significant valvular heart disease. Small pericardial effusion without evidence of cardiac tamponade.    Audria Nine, MD  07/20/2013 4:09 PM     Ct Abdomen Pelvis Wo Iv/ Wo Po Cont    07/20/2013   1. Interval development of a minimal to moderate pericardial effusion and trace pleural effusions since prior exam one day earlier. 2. No detectable acute abnormalities in the abdomen or pelvis. 3. Contrast residue gallbladder, kidneys, and bladder. 4. Numerous small low-attenuation kidney lesions. 5. Remainder as above. 6.  Findings discussed with Dr. Beryle Lathe by Dr. Oren Binet at 3:59 AM.  Sheria Lang, MD  07/20/2013 8:23 AM     Xr Chest 2 Views    08/12/2013   1. No interval change in the bilateral pleural effusions and atelectasis.  Dennison Mascot, MD  08/12/2013 8:52 AM     Xr Chest 2 Views    08/10/2013   Small pleural effusions and basilar atelectasis, decreased from prior.  Loel Ro, MD  08/10/2013 10:22 AM     Ct Angio Chest    07/19/2013   1. No CT scan evidence for detectable pulmonary embolism. 2. Minimal cardiomegaly. 3. Minimal bibasilar lung atelectasis. 4. Remainder as above.  Sheria Lang, MD  07/19/2013 3:18 PM     Xr Chest Ap Portable    08/07/2013   Stable exam with small right pleural effusion and moderate left pleural effusion.  Loel Lofty. Anna Genre, MD  08/07/2013 9:38 AM     Chest Ap Portable    07/30/2013    1. New bilateral pleural effusions with bibasilar airspace opacities. 2. Stable cardiomegaly.  Darin Engels, MD  07/30/2013 4:33 PM     Xr Chest Ap Portable    07/19/2013   Hypoinflation with minimal basilar atelectasis.  Sheria Lang, MD  07/19/2013 2:45 PM     Assessment:  (1.chest pain  2.pleural effusion  3.viral pericarditis  4.ESRD  5.DM  6.MGUS  7.HTN  8.Left LE DVT  9.anemia).       Plan:   (1.Hemodialysis with UF yesterday  2.aranesp sq q weekly  3.monitor serum electrolytes  4.on IV heparin and coumadin  anticoagulation).       Alric Quan  08/14/2013

## 2013-08-15 ENCOUNTER — Encounter
Admission: AD | Disposition: A | Discharge status: Home / Self Care | Payer: Self-pay | Source: Ambulatory Visit | Attending: Nephrology

## 2013-08-15 ENCOUNTER — Inpatient Hospital Stay: Payer: Medicare Other

## 2013-08-15 ENCOUNTER — Ambulatory Visit: Payer: Self-pay

## 2013-08-15 LAB — POCT GLUCOSE
Whole Blood Glucose POCT: 153 mg/dL — AB (ref 70–100)
Whole Blood Glucose POCT: 184 mg/dL — AB (ref 70–100)
Whole Blood Glucose POCT: 160 mg/dL — AB (ref 70–100)
Whole Blood Glucose POCT: 243 mg/dL — AB (ref 70–100)

## 2013-08-15 LAB — PROTEIN, BODY FLUID: Body Fluid Protein: 4.8 g/dL

## 2013-08-15 LAB — APTT
PTT: 83 — ABNORMAL HIGH (ref 23–37)
PTT: 71 — ABNORMAL HIGH (ref 23–37)
PTT: 35 (ref 23–37)

## 2013-08-15 LAB — BODY FLUID CELL COUNT
Body Fluid Lymphocytes: 38 %
Body Fluid Monocyte/Macrophage Cells: 16 %
Body Fluid Polymorphonuclear Cell: 46 %
Body Fluid RBC: 21000
Body Fluid WBC: 1158

## 2013-08-15 LAB — HEPATIC FUNCTION PANEL
ALT: 21 U/L (ref 0–55)
AST (SGOT): 15 U/L (ref 5–34)
Albumin/Globulin Ratio: 0.6 — ABNORMAL LOW (ref 0.9–2.2)
Albumin: 2.3 g/dL — ABNORMAL LOW (ref 3.5–5.0)
Alkaline Phosphatase: 72 U/L (ref 40–150)
Bilirubin Direct: 0.2 mg/dL (ref 0.0–0.5)
Bilirubin Indirect: 0.1 mg/dL (ref 0.0–1.1)
Bilirubin, Total: 0.3 mg/dL (ref 0.2–1.2)
Globulin: 3.8 g/dL — ABNORMAL HIGH (ref 2.0–3.6)
Protein, Total: 6.1 g/dL (ref 6.0–8.3)

## 2013-08-15 LAB — CBC AND DIFFERENTIAL
Basophils Absolute Automated: 0.02 (ref 0.00–0.20)
Basophils Automated: 0 %
Eosinophils Absolute Automated: 0.23 (ref 0.00–0.70)
Eosinophils Automated: 3 %
Hematocrit: 25.2 % — ABNORMAL LOW (ref 37.0–47.0)
Hgb: 8.4 g/dL — ABNORMAL LOW (ref 12.0–16.0)
Immature Granulocytes Absolute: 0.01
Immature Granulocytes: 0 %
Lymphocytes Absolute Automated: 2.54 (ref 0.50–4.40)
Lymphocytes Automated: 30 %
MCH: 28.9 pg (ref 28.0–32.0)
MCHC: 33.3 g/dL (ref 32.0–36.0)
MCV: 86.6 fL (ref 80.0–100.0)
MPV: 11.5 fL (ref 9.4–12.3)
Monocytes Absolute Automated: 1.06 (ref 0.00–1.20)
Monocytes: 12 %
Neutrophils Absolute: 4.77 (ref 1.80–8.10)
Neutrophils: 55 %
Nucleated RBC: 0 (ref 0–1)
Platelets: 267 (ref 140–400)
RBC: 2.91 — ABNORMAL LOW (ref 4.20–5.40)
RDW: 14 % (ref 12–15)
WBC: 8.62 (ref 3.50–10.80)

## 2013-08-15 LAB — PT/INR
PT INR: 1.5 — ABNORMAL HIGH (ref 0.9–1.1)
PT: 17.5 — ABNORMAL HIGH (ref 12.6–15.0)

## 2013-08-15 LAB — BASIC METABOLIC PANEL
Anion Gap: 11 (ref 5.0–15.0)
BUN: 40 mg/dL — ABNORMAL HIGH (ref 7–19)
CO2: 24 (ref 22–29)
Calcium: 8.7 mg/dL (ref 8.5–10.5)
Chloride: 94 — ABNORMAL LOW (ref 98–107)
Creatinine: 4.5 mg/dL — ABNORMAL HIGH (ref 0.6–1.0)
Glucose: 118 mg/dL — ABNORMAL HIGH (ref 70–100)
Potassium: 4.5 (ref 3.5–5.1)
Sodium: 129 — ABNORMAL LOW (ref 136–145)

## 2013-08-15 LAB — SPECIFIC GRAVITY, BODY FLUID: Specific Gravity, Body Fluid: 103.1

## 2013-08-15 LAB — AMYLASE, BODY FLUID: Body Fluid Amylase: 19 U/L

## 2013-08-15 LAB — LACTATE DEHYDROGENASE: LDH: 127 U/L (ref 125–331)

## 2013-08-15 LAB — LACTATE DEHYDROGENASE, BODY FLUID: Body Fluid LDH: 230 U/L

## 2013-08-15 LAB — CK: Creatine Kinase (CK): 29 U/L (ref 29–168)

## 2013-08-15 LAB — TROPONIN I: Troponin I: <0.01 ng/mL (ref 0.00–0.09)

## 2013-08-15 LAB — GFR: EGFR: 11.7

## 2013-08-15 SURGERY — THORACENTESIS
Anesthesia: Local | Site: Chest | Laterality: Left

## 2013-08-15 MED ORDER — LIDOCAINE 1% BUFFERED - CNR/OUTSOURCED
INTRAMUSCULAR | Status: AC
Start: 2013-08-15 — End: ?
  Filled 2013-08-15: qty 22

## 2013-08-15 NOTE — Progress Notes (Signed)
Date Time: 08/15/2013 10:40 AM  Patient Name: Mary [name],Mary [name]    Subjective:   Pt reports increased chest discomfort described as pressure-like and associated shortness of breath.  No associated diaphoresis, no nausea or palpitations.  Pt reports symptoms started acutely overnight and have progressively worsened.     Objective:   VITALS:  Filed Vitals:    08/15/13 0029 08/15/13 0405 08/15/13 0600 08/15/13 0740   BP: 139/60 155/63  117/55   Pulse: 102 100  82   Temp: 99.1 F (37.3 C) 99.2 F (37.3 C)  98.4 F (36.9 C)   TempSrc: Axillary      Resp: 18 20  19    Height:       Weight:   96.616 kg (213 lb)    SpO2: 100% 97%  94%     94% on 2LNC    Intake and Output Summary (Last 24 hours) at Date Time    Intake/Output Summary (Last 24 hours) at 08/15/13 [id number]  Last data filed at 08/15/13 0500   Gross per 24 hour   Intake    240 ml   Output    450 ml   Net   -210 ml     PHYSICAL EXAM:  GEN APPEARANCE: Moderate distress, tachypneic, sitting on side of bed  HEENT: PERLA; EOMI; Conjunctiva Clear  NECK: Supple; FROM; no JVD  CVS: RRR, S1, S2; No M/[name]/R  LUNGS: decreased bs on left; no rales or rhonchi  ABD: Soft; NT/ND; Normoactive BS  EXT: trace BLE edema  SKIN: No rash or lesions  NEURO: CN 2-12 intact; No gross focal neurological deficits  PSYCH: Alert and oriented x3. Anxious-appearing.    Labs and Diagnostic Data:       Lab 08/15/13 0925 08/14/13 0526 08/13/13 0954   WBC 8.62 9.32 9.83   HGB 8.4* 8.1* 8.9*   HCT 25.2* 24.7* 26.4*   MCV 86.6 88.2 86.3   PLT 267 235 251       Lab 08/15/13 0925 08/14/13 0526 08/13/13 0954 08/11/13 0834 08/09/13 1530   NA 129* 129* 130* 137 131*   K 4.5 4.6 4.6 4.1 4.3   CL 94* 93* 93* 100 96*   CO2 24 26 25 27 23    BUN 40* 24* 34* 31* 39*   CREAT 4.5* 3.6* 4.3* 4.6* 5.1*   GLU 118* 200* 139* 133* 325*   CA 8.7 8.7 9.0 9.0 8.7       Results     Procedure Component Value Units Date/Time    POCT glucose (AC and HS) [id number]  (Abnormal) Collected:08/15/13 0633     POCT Glucose WB 153 (A)  mg/dL Updated:08/15/13 0633    APTT [id number]  (Abnormal) Collected:08/14/13 2302     PTT 83 (H) Updated:08/15/13 0142     APTT Anticoag. Given w/i 48 hrs. heparin     POCT glucose (AC and HS) [id number]  (Abnormal) Collected:08/14/13 2143     POCT Glucose WB 165 (A) mg/dL Updated:08/14/13 2143    POCT glucose (AC and HS) [id number]  (Abnormal) Collected:08/14/13 1653     POCT Glucose WB 208 (A) mg/dL Updated:08/14/13 1653    APTT [id number]  (Abnormal) Collected:08/14/13 1157     PTT 66 (H) Updated:08/14/13 1225     APTT Anticoag. Given w/i 48 hrs. heparin     Narrative:    AS NEEDED PER HEPARIN GTT PROTOCOL    POCT glucose (AC and HS) [id number]  (Abnormal) Collected:08/14/13 1151     POCT Glucose  WB 228 (A) mg/dL Updated:08/14/13 1152        Radiological Studies:   Imaging reviewed.  CXR (8/17): Stable exam with small right pleural effusion and moderate left pleural effusion.    Current Medications:     Current Facility-Administered Medications   Medication Dose Route Frequency   . amLODIPine  2.5 mg Oral Daily   . aspirin  81 mg Oral Daily   . atorvastatin  10 mg Oral Daily   . b complex-vitamin c-folic acid  1 tablet Oral Daily   . bisacodyl  5 mg Oral Daily   . colchicine  0.6 mg Oral Daily   . gabapentin  300 mg Oral TID   . insulin glargine  12 Units Subcutaneous QHS   . lidocaine  1 patch Transdermal Q24H   . pantoprazole  40 mg Oral QAM AC   . pregabalin  75 mg Oral Q12H [place]   . warfarin  7.5 mg Oral Daily at 1800     Assessment and Plan:   Mary [name] is a 67 y.o. female with PMH significant for recent hospitalizations in the setting of viral pericarditis with moderate pericardial effusion and bilateral pleural effusions who was previously diagnosed with left lower extremity deep venous thrombosis, history of end-stage renal disease on hemodialysis, hypertension, hyperlipidemia, and insulin-dependent diabetes who was transferred from [facility] this morning secondary to recurrent  chest pain with associated shortness of breath.     1. Chest pain, atypical. Appreciate Cardiology and Pulmonary asst.  Repeat CXR ordered STAT.  12lead EKG and CEs pending.  2. 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  Cardiology and Nephrology following.  HD per Nephrology.  3. Bilateral pleural effusions.  Appreciate pulmonary asst.  Repeat CXR stat.   4. Pericardial effusion. The patient's blood pressure is currently stable. She does not have evidence of tamponade.  Appreciate Cardiology asst.  Recent 2DE as outpt with report of small pericardial effusion, confirmed by Dr. Elder Negus.  5. Hypervolemic hyponatremia.  HD for volume removal.   6. Recent viral pericarditis. Continue colchicine.   7. Anemia of chronic disease. The patient denies any episodes of black or bloody stools. Continue Aranesp per nephrology.   8. End-stage renal disease on hemodialysis. Dr. Leonette Most following.  9. HTN. Continue home medications.   10. IDDM.  Continue Lantus. We will continue to monitor Accu-Cheks q.a.c. and nightly and administer sliding scale as needed.  11. Recent left lower extremity deep venous thrombosis. The patient was previously on Coumadin; however, INR subtherapeutic. Given the potential for procedures, the patient is on heparin drip.   12. Gastrointestinal prophylaxis. Pepcid.   13. Deep venous thrombosis prophylaxis. Heparin gtt/Coumadin.    DISPOSITION:   Continue to monitor closely on telemetry.     Mary Apple, MD    ADDENDUM:  CXR with evidence of significant interval large enlargement of left pleural effusion.  IR consulted for thoracentesis.  Pt informed of findings and agrees to procedure.  Pulmonary re-consulted.

## 2013-08-15 NOTE — Plan of Care (Signed)
Problem: Pain  Goal: Patient's pain/discomfort is manageable  Outcome: Progressing  Pt had Lt sided and mid sternal chest pain and sob with O 2 sat 88 % on RA. BP stable,temp 99.2,started 2 L nc, improved sats to 94 %.  12 lead EKG done - Sinus tach,notified Dr.Gir,order noted for CE with Am labs. Percocet given for pain. Continuing heparin drip at low intensity.Will continue to monitor.    Problem: Moderate/High Fall Risk Score >/=15  Goal: Patient will remain free of falls  Outcome: Progressing  Pt ambulates with minimal assist ,denies any dizziness. Fall precautions maintained. Call bell and phone at reach.

## 2013-08-15 NOTE — Progress Notes (Addendum)
Complaint of SOB on 2 liter of oxygen via nasal cannula. However oxygen saturation is 98 %.  No rales, crakles or wheezes on auscultation. MD to be notified.

## 2013-08-15 NOTE — Progress Notes (Addendum)
[facility] Progress Note      Date Time: 08/15/2013 12:54 PM    Pulmonary Attending addendum:   At my visit, patient "Mary [name]" clear liquids and 4 family members in room decrying the emergency procedure that they were not alerted to in order to be by her side.  She reports significant relief after 1100 cc serosanguinous exudate drained.  Cultures are pending, if negative perhaps corticosteroids may be helpful to reduce recurrence.    ML Nechama Guard MD [facility]    24 hr Events/Subjective:   Increasing left chest pain, sats noted 88% on RA  CXR with significant enlargement of the left pleural effusion    Assessment:     1. Increasing left pleural effusion with pleuritic chest pain   2. Hypoxia - resolved on oxygen  3. Recent viral pericarditis  4. Diastolic dysfunction  5. ESRD on HD  6. LLE DVT - on coumadin: [place] 05/2013 with subtherapeutic INR on presentation  7. DM  8. HTN  9. Anemia    Plan:   1. Left thoracentesis - send for studies  2. HD per renal  3. DVT prophylaxis = on coumadin DVT 05/2013  4. Code status = full  5. When discharged follow up in office with Dr Nechama Guard     Medications:      Scheduled Meds: PRN Meds:           amLODIPine 2.5 mg Oral Daily   aspirin 81 mg Oral Daily   atorvastatin 10 mg Oral Daily   b complex-vitamin c-folic acid 1 tablet Oral Daily   bisacodyl 5 mg Oral Daily   colchicine 0.6 mg Oral Daily   gabapentin 300 mg Oral TID   insulin glargine 12 Units Subcutaneous QHS   lidocaine 1 patch Transdermal Q24H   pantoprazole 40 mg Oral QAM AC   pregabalin 75 mg Oral Q12H [facility]   warfarin 7.5 mg Oral Daily at 1800       Continuous Infusions:       . heparin 25000 units in dextrose 5% 500 mL 950 Units/hr (08/15/13 0743)         acetaminophen 650 mg Q6H PRN   alum & mag hydroxide-simethicone 30 mL Q4H PRN   dextrose 15 g PRN   dextrose 25 mL PRN   glucagon (rDNA) 1 mg PRN   heparin (porcine) 3,000 Units PRN   insulin regular 1-8 Units TID AC PRN   lidocaine  PRN   lidocaine  PRN   oxyCODONE-acetaminophen 2  tablet Q6H PRN   simethicone 80 mg Q6H PRN   traMADol 50 mg Q6H PRN           Physical Exam:     Filed Vitals:    08/15/13 0600 08/15/13 0740 08/15/13 1010 08/15/13 1137   BP:  117/55 110/64 114/69   Pulse:  82  101   Temp:  98.4 F (36.9 C) 98 F (36.7 C) 98.3 F (36.8 C)   TempSrc:       Resp:  19 20 19    Height:       Weight: 96.616 kg (213 lb)      SpO2:  94% 98% 98%     Temp (24hrs), Avg:98.3 F (36.8 C), Min:97.6 F (36.4 C), Max:99.2 F (37.3 C)    General Appearance:  fatigued, no acute distress  Lungs:    diminished  Left, RLL crackles  Heart:    regular rhythm,  no murmur   Abdomen:   soft, non-tender  Extremities:   1+ RLE >LLE  edema  Neurologic:  Drowsy but arousable and oriented, moves all extremities equally      Intake/Output Summary (Last 24 hours) at 08/15/13 1254  Last data filed at 08/15/13 0500   Gross per 24 hour   Intake    240 ml   Output    450 ml   Net   -210 ml       Active PICC Line / CVC Line / PIV Line / Drain / Airway / Intraosseous Line / Epidural Line / ART Line / Line / Wound / Pressure Ulcer / NG/OG Tube     Name   Placement date   Placement time   Site   Days    Peripheral IV 08/07/13 Right Other (Comment)  08/07/13      Other (Comment)   2    Graft/Fistula Left          --          Labs:       Lab 08/15/13 0925 08/14/13 0526 08/13/13 0954 08/11/13 0834 08/09/13 1530   GLU 118* 200* 139* 133* 325*        Lab 08/15/13 0925 08/14/13 0526 08/13/13 0954   WBC 8.62 9.32 9.83   RBC 2.91* 2.80* 3.06*   HGB 8.4* 8.1* 8.9*   HCT 25.2* 24.7* 26.4*   MCV 86.6 88.2 86.3   MCHC 33.3 32.8 33.7   RDW 14 14 14    MPV 11.5 11.7 11.0   PLT 267 235 251       Lab 08/13/13 0954 08/11/13 0834 08/09/13 1530   ALT 35 -- --   AST 28 -- --   GLOB 3.9* -- --   ALB 2.6* 2.4* 2.5*       Lab 08/15/13 0925   CK 29   TROPI <0.01   TROPT --   CKMBINDEX --       Lab 08/13/13 0954   BILITOTAL 0.5   BILIDIRECT --   PROT 6.5   ALB 2.6*   ALT 35   AST 28       Lab 08/15/13 0925   PT 17.5*   INR 1.5*   PTT 71*        Lab 08/15/13 0925 08/14/13 0526 08/13/13 0954 08/11/13 0834 08/09/13 1530   NA 129* 129* 130* -- --   K 4.5 4.6 4.6 -- --   CL 94* 93* 93* -- --   CO2 24 26 25  -- --   BUN 40* 24* 34* -- --   CREAT 4.5* 3.6* 4.3* -- --   CA 8.7 8.7 9.0 -- --   ALB -- -- 2.6* 2.4* 2.5*   PROT -- -- 6.5 -- --   BILITOTAL -- -- 0.5 -- --   ALKPHOS -- -- 77 -- --   ALT -- -- 35 -- --   AST -- -- 28 -- --   GLU 118* 200* 139* -- --       Microbiology:     Rads:   Radiological Procedure reviewed    8/25 CXR  Significant interval large enlargement of the left pleural  effusion    8/22 CXR  No interval change in the bilateral pleural effusions and  atelectasis    8/17 CXR   Stable exam with small right pleural effusion and moderate left pleural  effusion.    Signed:  Chyrl Civatte NP  [facility]

## 2013-08-15 NOTE — Progress Notes (Addendum)
Pt received to IR for diagnostic and therapeutic Left thoracentesis, acc with her God-daughter. Pt speaking with Dr. Wynn Banker. Consent signed. Pt positioned onto the side of her bed. Left posterior chest prepped and draped. Left lower, posterior stick.  1645: Return of 1100cc of clear serosanguinous colored fluid. Pt tolerated well. Ease of breathing better. Sats 94% on 2 liters.  1652: Chest Xray completed.   1700: Dr. Wynn Banker stating pt okay to return to room. No pneumo noted. Pt stating she is feeling better. VSS. Report given to [facility].

## 2013-08-15 NOTE — Brief Op Note (Signed)
BRIEF IR PROCEDURE NOTE    Date Time: 08/15/2013 4:47 PM    Patient Name:   Mary [name]    Date of Operation:   08/15/2013    Providers Performing:   Surgeon(s):  Elizbeth Squires, MD    Assistant (s):  RT  Operative Procedure:   Procedure(s):  LEFT THORACENTESIS    Preoperative Diagnosis:   Pre-Op Diagnosis Codes:     * Pleural effusion [511.9]    Postoperative Diagnosis:   * No post-op diagnosis entered *    Anesthesia:   (  ) FENTANYL  (  ) VERSED  (X  ) LOCAL  (  ) GENERAL ANESTHESIA (DEPT OF ANESTHESIOLOGY) )    Estimated Blood Loss:   0 CC      CONTRAST   NONE    RADIATION DOSE    NONE    Findings:   1,100cc serosanguinous pleural fluid removed    Complications:   NONE      Signed by: Elizbeth Squires, MD                                                                              FO IVR

## 2013-08-15 NOTE — Progress Notes (Signed)
The attending was notified about the SOB. Dr Emelia Loron orders EKG stat, and PCXR. Done.

## 2013-08-15 NOTE — Plan of Care (Signed)
Problem: Health Promotion  Goal: Risk control - tobacco abuse  Actions to eliminate or reduce tobacco use.   Outcome: Completed Date Met:  08/15/13  Non smokker    Problem: Pain  Goal: Patient's pain/discomfort is manageable  Outcome: Progressing  Pt denies pain. Will continue to monitor for pain.     Problem: Potential for Compromised Hemodynamic Status  Goal: Stable vital signs and fluid balance  Outcome: Progressing  Pt complained of severe SOB even if oxygen saturation is 97% on 2 liter via nasal cannula. MD notified and orders EKG as well as PCXR. Pt has pleural effusion. Heparin drip stopped at 1148. Paracentesis done and pt feels better at this time. Will continue to assess for SOB.

## 2013-08-15 NOTE — Progress Notes (Signed)
Mary [name] is a 67 y.o. female patient with PMH notable for  DM, HTN, ESRD on hemodialysis, admitted because of SOB.    Active Problems:   Chest pain    Past Medical History   Diagnosis Date   . Diabetes mellitus without complication    . Hyperlipidemia    . Hypertensive disorder    . Chronic kidney disease      dialysis t-th-sat   . Arthritis    . Cataracts, bilateral    . GERD (gastroesophageal reflux disease)    . Acute deep vein thrombosis (DVT) of distal vein of left lower extremity    . Hemodialysis access site with mature fistula      Current Facility-Administered Medications   Medication Dose Route Frequency Provider Last Rate Last Dose   . acetaminophen (TYLENOL) tablet 650 mg  650 mg Oral Q6H PRN Laroy Apple, MD       . alum & mag hydroxide-simethicone (MAALOX PLUS) 200-200-20 mg/5 mL suspension 30 mL  30 mL Oral Q4H PRN Noralyn Pick, MD       . amLODIPine [facility]) tablet 2.5 mg  2.5 mg Oral Daily Noralyn Pick, MD   2.5 mg at 08/15/13 1016   . aspirin chewable tablet 81 mg  81 mg Oral Daily Laroy Apple, MD   81 mg at 08/15/13 1016   . atorvastatin (LIPITOR) tablet 10 mg  10 mg Oral Daily Laroy Apple, MD   10 mg at 08/14/13 2204   . b complex-vitamin c-folic acid (NEPHRO-VITE) tablet 1 tablet  1 tablet Oral Daily Laroy Apple, MD   1 tablet at 08/15/13 1016   . bisacodyl (DULCOLAX) EC tablet 5 mg  5 mg Oral Daily Noralyn Pick, MD   5 mg at 08/15/13 1016   . colchicine tablet 0.6 mg  0.6 mg Oral Daily Laroy Apple, MD   0.6 mg at 08/15/13 1019   . dextrose (GLUCOSE) 40 % oral gel 15 g  15 g Oral PRN Laroy Apple, MD       . dextrose 50 % bolus 25 mL  25 mL Intravenous PRN Laroy Apple, MD       . gabapentin (NEURONTIN) capsule 300 mg  300 mg Oral TID Laroy Apple, MD   300 mg at 08/15/13 1016   . glucagon (rDNA) (GLUCAGEN) injection 1 mg  1 mg Intramuscular PRN Laroy Apple, MD       . heparin (porcine) injection 3,000 Units  3,000 Units Intravenous PRN Noralyn Pick, MD   3,000 Units at 08/12/13 1330   . heparin 25000 units in dextrose 5% 500 mL infusion (premix)  1,000 Units/hr Intravenous Continuous Noralyn Pick, MD 19 mL/hr at 08/15/13 0743 950 Units/hr at 08/15/13 0743   . insulin glargine (LANTUS) injection 12 Units  12 Units Subcutaneous QHS Laroy Apple, MD   12 Units at 08/14/13 2204   . insulin regular (HumuLIN R,NovoLIN R) injection 1-8 Units  1-8 Units Subcutaneous TID AC PRN Laroy Apple, MD   3 Units at 08/14/13 1700   . lidocaine (LIDODERM) 5 % 1 patch  1 patch Transdermal Q24H [place]in [place], Kittie Plater A, MD   1 patch at 08/11/13 1541   . lidocaine (LMX PLUS 4%) 4 % dressing   Topical PRN Alric Quan, MD       . lidocaine (LMX PLUS 4%) 4 % dressing   Topical PRN  Alric Quan, MD       . oxyCODONE-acetaminophen (PERCOCET) 5-325 MG per tablet 2 tablet  2 tablet Oral Q6H PRN Noralyn Pick, MD   2 tablet at 08/15/13 0421   . pantoprazole (PROTONIX) EC tablet 40 mg  40 mg Oral QAM AC Laroy Apple, MD   40 mg at 08/15/13 [id number]   . pregabalin (LYRICA) capsule 75 mg  75 mg Oral Q12H [place] Laroy Apple, MD   75 mg at 08/15/13 1016   . simethicone (MYLICON) chewable tablet 80 mg  80 mg Oral Q6H PRN Noralyn Pick, MD   80 mg at 08/13/13 1452   . traMADol (ULTRAM) tablet 50 mg  50 mg Oral Q6H PRN Laroy Apple, MD   50 mg at 08/13/13 2114   . warfarin (COUMADIN) tablet 7.5 mg  7.5 mg Oral Daily at 1800 Noralyn Pick, MD   7.5 mg at 08/14/13 1701     Allergies   Allergen Reactions   . Penicillins Hives   . Shrimp (Shellfish Allergy) Swelling     Blood pressure 114/69, pulse 101, temperature 98.3 F (36.8 C), temperature source Axillary, resp. rate 19, height 1.626 m (5\' 4" ), weight 96.616 kg (213 lb), SpO2 98.00%.    Subjective:  Symptoms:  She  reports shortness of breath.    Diet:  Adequate intake.    Activity level: Impaired due to weakness.      Objective:  General Appearance:  In no acute distress.    Vital signs: (most recent): Blood pressure 114/69, pulse 101, temperature 98.3 F (36.8 C), temperature source Axillary, resp. rate 19, height 1.626 m (5\' 4" ), weight 96.616 kg (213 lb), SpO2 98.00%.    Output: No urine output and producing stool.    Lungs:  There are decreased breath sounds.    Heart: Normal rate.    Neurological: Patient is alert.    Skin:  Warm.    Abdomen: Abdomen is soft.      Results     Procedure Component Value Units Date/Time    Hepatic function panel (LFT) [id number]  (Abnormal) Collected:08/15/13 0925     Bilirubin, Total 0.3 mg/dL Updated:08/15/13 1336     Bilirubin, Direct 0.2 mg/dL      Bilirubin, Indirect 0.1 mg/dL      AST (SGOT) 15 U/L      ALT 21 U/L      Alkaline Phosphatase 72 U/L      Protein, Total 6.1 g/dL      Albumin 2.3 (L) g/dL      Globulin 3.8 (H) g/dL      Albumin/Globulin Ratio 0.6 (L)     Lactate dehydrogenase [id number] Collected:08/15/13 0925     LDH 127 U/L Updated:08/15/13 1336    POCT glucose (AC and HS) [id number]  (Abnormal) Collected:08/15/13 1139     POCT Glucose WB 184 (A) mg/dL Updated:08/15/13 1139    Troponin I [id number] Collected:08/15/13 0925    Specimen Information:Blood Updated:08/15/13 1017     Troponin I <0.01 ng/mL     Basic Metabolic Panel [id number]  (Abnormal) Collected:08/15/13 0925    Specimen Information:Blood Updated:08/15/13 1013     Glucose 118 (H) mg/dL      BUN 40 (H) mg/dL      Creatinine 4.5 (H) mg/dL      CALCIUM 8.7 mg/dL      Sodium 129 (L)      Potassium 4.5      Chloride 94 (L)  CO2 24      Anion Gap 11.0     GFR [id number] Collected:08/15/13 0925     EGFR 11.7 Updated:08/15/13 1013    CREATINE KINASE LEVEL (CK) [id number] Collected:08/15/13 0925    Specimen Information:Blood Updated:08/15/13 1013     Creatine Kinase (CK) 29 U/L     APTT [id number]   (Abnormal) Collected:08/15/13 0925     PTT 71 (H) Updated:08/15/13 0958    Prothrombin time/INR [id number]  (Abnormal) Collected:08/15/13 0925    Specimen Information:Blood Updated:08/15/13 0956     PT 17.5 (H)      PT INR 1.5 (H)      PT Anticoag. Given Within 48 hrs. heparin     CBC and differential [id number]  (Abnormal) Collected:08/15/13 0925    Specimen Information:Blood / Blood Updated:08/15/13 0950     WBC 8.62      RBC 2.91 (L)      Hgb 8.4 (L) g/dL      Hematocrit 25.2 (L) %      MCV 86.6 fL      MCH 28.9 pg      MCHC 33.3 g/dL      RDW 14 %      Platelets 267      MPV 11.5 fL      Neutrophils 55 %      Lymphocytes Automated 30 %      Monocytes 12 %      Eosinophils Automated 3 %      Basophils Automated 0 %      Immature Granulocyte 0 %      Nucleated RBC 0      Neutrophils Absolute 4.77      Abs Lymph Automated 2.54      Abs Mono Automated 1.06      Abs Eos Automated 0.23      Absolute Baso Automated 0.02      Absolute Immature Granulocyte 0.01     POCT glucose (AC and HS) [id number]  (Abnormal) Collected:08/15/13 0633     POCT Glucose WB 153 (A) mg/dL Updated:08/15/13 0633    APTT [id number]  (Abnormal) Collected:08/14/13 2302     PTT 83 (H) Updated:08/15/13 0142     APTT Anticoag. Given w/i 48 hrs. heparin     POCT glucose (AC and HS) [id number]  (Abnormal) Collected:08/14/13 2143     POCT Glucose WB 165 (A) mg/dL Updated:08/14/13 2143    POCT glucose (AC and HS) [id number]  (Abnormal) Collected:08/14/13 1653     POCT Glucose WB 208 (A) mg/dL Updated:08/14/13 1653      Echocardiogram Adult Complete W Clr/ Dopp Waveform    08/01/2013  impression: 1. Atrial septal aneurysm is present. 2. There is a small to moderate pericardial effusion noted both anteriorly and posteriorly.  this is only slightly increased from the previous echocardiogram dated 07/20/2013. Variation of tricuspid flow velocity is noted. There is no diastolic collapse of the right ventricle noted. There is organized material note  noted in the apex of the right ventricle not previously reported. Small small pleural effusion is present. Clinical correlation is required considering these echocardiographic changes. 3. There are no other significant echogram echocardiographic changes noted.  Lennon Alstrom, MD  08/01/2013 2:03 PM     Echocardiogram Adult Complete W Color Doppler Waveform    07/20/2013   Normal left ventricular systolic function. No significant valvular heart disease. Small pericardial effusion without evidence of cardiac tamponade.    Audria Nine, MD  07/20/2013 4:09 PM  Ct Abdomen Pelvis Wo Iv/ Wo Po Cont    07/20/2013   1. Interval development of a minimal to moderate pericardial effusion and trace pleural effusions since prior exam one day earlier. 2. No detectable acute abnormalities in the abdomen or pelvis. 3. Contrast residue gallbladder, kidneys, and bladder. 4. Numerous small low-attenuation kidney lesions. 5. Remainder as above. 6. Findings discussed with Dr. Beryle Lathe by Dr. Oren Binet at 3:59 AM.  Sheria Lang, MD  07/20/2013 8:23 AM     Xr Chest 2 Views    08/12/2013   1. No interval change in the bilateral pleural effusions and atelectasis.  Dennison Mascot, MD  08/12/2013 8:52 AM     Xr Chest 2 Views    08/10/2013   Small pleural effusions and basilar atelectasis, decreased from prior.  Loel Ro, MD  08/10/2013 10:22 AM     Ct Angio Chest    07/19/2013   1. No CT scan evidence for detectable pulmonary embolism. 2. Minimal cardiomegaly. 3. Minimal bibasilar lung atelectasis. 4. Remainder as above.  Sheria Lang, MD  07/19/2013 3:18 PM     Xr Chest Ap Portable    08/15/2013   Significant interval large enlargement of the left pleural effusion.  Results given via telephone to Dr. Daleen Snook on 08/15/2013 at 11:31 AM  Pleas Koch, MD  08/15/2013 11:32 AM     Xr Chest Ap Portable    08/07/2013   Stable exam with small right pleural effusion and moderate left pleural effusion.  Loel Lofty. Anna Genre, MD  08/07/2013 9:38 AM     Chest Ap  Portable    07/30/2013    1. New bilateral pleural effusions with bibasilar airspace opacities. 2. Stable cardiomegaly.  Darin Engels, MD  07/30/2013 4:33 PM     Xr Chest Ap Portable    07/19/2013   Hypoinflation with minimal basilar atelectasis.  Sheria Lang, MD  07/19/2013 2:45 PM     Assessment:  (1.chest pain  2.Left pleural effusion  3.viral pericarditis  4.ESRD  5.DM  6.MGUS  7.HTN  8.Left LE DVT  9.anemia).       Plan:   (1.Hemodialysis with UF scheduled for AM   2.aranesp sq q weekly  3.monitor serum electrolytes  4.on IV heparin and coumadin anticoagulation  5.for pleural tap).       Alric Quan  08/15/2013

## 2013-08-16 LAB — GFR
EGFR: 11.1
EGFR: 20.3

## 2013-08-16 LAB — RENAL FUNCTION PANEL
Albumin: 2.2 g/dL — ABNORMAL LOW (ref 3.5–5.0)
Anion Gap: 11 (ref 5.0–15.0)
BUN: 22 mg/dL — ABNORMAL HIGH (ref 7–19)
CO2: 27 (ref 22–29)
Calcium: 9 mg/dL (ref 8.5–10.5)
Chloride: 98 (ref 98–107)
Creatinine: 2.8 mg/dL — ABNORMAL HIGH (ref 0.6–1.0)
Glucose: 228 mg/dL — ABNORMAL HIGH (ref 70–100)
Phosphorus: 3.8 mg/dL (ref 2.3–4.7)
Potassium: 4.2 (ref 3.5–5.1)
Sodium: 136 (ref 136–145)

## 2013-08-16 LAB — CBC AND DIFFERENTIAL
Basophils Absolute Automated: 0.02 (ref 0.00–0.20)
Basophils Absolute Automated: 0.04 (ref 0.00–0.20)
Basophils Automated: 0 %
Basophils Automated: 0 %
Eosinophils Absolute Automated: 0.22 (ref 0.00–0.70)
Eosinophils Absolute Automated: 0.16 (ref 0.00–0.70)
Eosinophils Automated: 3 %
Eosinophils Automated: 2 %
Hematocrit: 23.2 % — ABNORMAL LOW (ref 37.0–47.0)
Hematocrit: 26.2 % — ABNORMAL LOW (ref 37.0–47.0)
Hgb: 7.8 g/dL — ABNORMAL LOW (ref 12.0–16.0)
Hgb: 8.6 g/dL — ABNORMAL LOW (ref 12.0–16.0)
Immature Granulocytes Absolute: 0
Immature Granulocytes Absolute: 0.03
Immature Granulocytes: 0 %
Immature Granulocytes: 0 %
Lymphocytes Absolute Automated: 2.23 (ref 0.50–4.40)
Lymphocytes Absolute Automated: 1.81 (ref 0.50–4.40)
Lymphocytes Automated: 29 %
Lymphocytes Automated: 24 %
MCH: 29 pg (ref 28.0–32.0)
MCH: 28.5 pg (ref 28.0–32.0)
MCHC: 33.6 g/dL (ref 32.0–36.0)
MCHC: 32.8 g/dL (ref 32.0–36.0)
MCV: 86.2 fL (ref 80.0–100.0)
MCV: 86.8 fL (ref 80.0–100.0)
MPV: 10.9 fL (ref 9.4–12.3)
MPV: 11.2 fL (ref 9.4–12.3)
Monocytes Absolute Automated: 0.89 (ref 0.00–1.20)
Monocytes Absolute Automated: 0.79 (ref 0.00–1.20)
Monocytes: 12 %
Monocytes: 10 %
Neutrophils Absolute: 4.32 (ref 1.80–8.10)
Neutrophils Absolute: 4.89 (ref 1.80–8.10)
Neutrophils: 56 %
Neutrophils: 64 %
Nucleated RBC: 0 (ref 0–1)
Nucleated RBC: 0 (ref 0–1)
Platelets: 273 (ref 140–400)
Platelets: 257 (ref 140–400)
RBC: 2.69 — ABNORMAL LOW (ref 4.20–5.40)
RBC: 3.02 — ABNORMAL LOW (ref 4.20–5.40)
RDW: 14 % (ref 12–15)
RDW: 14 % (ref 12–15)
WBC: 7.68 (ref 3.50–10.80)
WBC: 7.69 (ref 3.50–10.80)

## 2013-08-16 LAB — APTT
PTT: 114 — ABNORMAL HIGH (ref 23–37)
PTT: 94 — ABNORMAL HIGH (ref 23–37)
PTT: 30 (ref 23–37)
PTT: 57 — ABNORMAL HIGH (ref 23–37)

## 2013-08-16 LAB — PT/INR
PT INR: 1.6 — ABNORMAL HIGH (ref 0.9–1.1)
PT: 18.8 — ABNORMAL HIGH (ref 12.6–15.0)

## 2013-08-16 LAB — ECG 12-LEAD
Atrial Rate: 101 {beats}/min
P Axis: 62 degrees
P-R Interval: 156 ms
Q-T Interval: 352 ms
QRS Duration: 82 ms
QTC Calculation (Bezet): 456 ms
R Axis: 2 degrees
T Axis: 31 degrees
Ventricular Rate: 101 {beats}/min

## 2013-08-16 LAB — BASIC METABOLIC PANEL
Anion Gap: 11 (ref 5.0–15.0)
BUN: 49 mg/dL — ABNORMAL HIGH (ref 7–19)
CO2: 24 (ref 22–29)
Calcium: 8.2 mg/dL — ABNORMAL LOW (ref 8.5–10.5)
Chloride: 95 — ABNORMAL LOW (ref 98–107)
Creatinine: 4.7 mg/dL — ABNORMAL HIGH (ref 0.6–1.0)
Glucose: 241 mg/dL — ABNORMAL HIGH (ref 70–100)
Potassium: 4.4 (ref 3.5–5.1)
Sodium: 130 — ABNORMAL LOW (ref 136–145)

## 2013-08-16 LAB — POCT GLUCOSE
Whole Blood Glucose POCT: 167 mg/dL — AB (ref 70–100)
Whole Blood Glucose POCT: 164 mg/dL — AB (ref 70–100)
Whole Blood Glucose POCT: 199 mg/dL — AB (ref 70–100)
Whole Blood Glucose POCT: 276 mg/dL — AB (ref 70–100)

## 2013-08-16 LAB — TROPONIN I: Troponin I: 0.01 ng/mL (ref 0.00–0.09)

## 2013-08-16 LAB — CK: Creatine Kinase (CK): 34 U/L (ref 29–168)

## 2013-08-16 LAB — BODY FLUID PH: Body Fluid pH: 7.9

## 2013-08-16 MED ORDER — SODIUM CHLORIDE 0.9 % IV BOLUS
100.0000 mL | INTRAVENOUS | Status: DC | PRN
Start: 2013-08-16 — End: 2013-08-19

## 2013-08-16 MED ORDER — SODIUM CHLORIDE 0.9 % IV BOLUS
250.0000 mL | INTRAVENOUS | Status: DC | PRN
Start: 2013-08-16 — End: 2013-08-19

## 2013-08-16 NOTE — Plan of Care (Signed)
Problem: Potential for Compromised Hemodynamic Status  Goal: Stable vital signs and fluid balance  Outcome: Progressing  Pt is alert and oriented x 3.  Pt is receiving hemodialysis as well as low intensity heparin per protocol.  Vitals are stable, however pt has been trending low in 02 sat. Has been on 2L NC.   Pt is resting comfortably and denies pain. All other needs met.

## 2013-08-16 NOTE — Progress Notes (Addendum)
[facility] Progress Note      Date Time: 08/16/2013 1:47 PM             24 hr Events/Subjective:   Thoracentesis yesterday- 100 cc serosanguinous fluid drained - exudative - Pleural fluid cx - no organisms  Feels much better  HD this am - 2 Liters off  sats 98% on 2 L NC - Using IS - only 500cc    Assessment:     1. L > R pleural effusion with pleuritic chest pain - left thoracentesis 8/25 1100 exudative serosang fluid  2. Hypoxia related to effusions & hypoventilation  3. Recent viral pericarditis  4. Diastolic dysfunction  5. ESRD on HD  6. Hyponatremia  7. LLE DVT - on coumadin: [place] 05/2013 with subtherapeutic INR on presentation  8. DM  9. HTN  10. Anemia    Plan:   1. Await cytology & cx on pleural fluid  2. HD per renal  3. DVT prophylaxis = on coumadin DVT 05/2013  4. Code status = full  5. When discharged follow up in office with Dr Nechama Guard     Medications:      Scheduled Meds: PRN Meds:           amLODIPine 2.5 mg Oral Daily   aspirin 81 mg Oral Daily   atorvastatin 10 mg Oral Daily   b complex-vitamin c-folic acid 1 tablet Oral Daily   bisacodyl 5 mg Oral Daily   colchicine 0.6 mg Oral Daily   gabapentin 300 mg Oral TID   insulin glargine 12 Units Subcutaneous QHS   lidocaine 1 patch Transdermal Q24H   pantoprazole 40 mg Oral QAM AC   pregabalin 75 mg Oral Q12H [facility]   warfarin 7.5 mg Oral Daily at 1800       Continuous Infusions:       . heparin 25000 units in dextrose 5% 500 mL 600 Units/hr (08/16/13 1141)         acetaminophen 650 mg Q6H PRN   alum & mag hydroxide-simethicone 30 mL Q4H PRN   dextrose 15 g PRN   dextrose 25 mL PRN   glucagon (rDNA) 1 mg PRN   heparin (porcine) 3,000 Units PRN   insulin regular 1-8 Units TID AC PRN   lidocaine  PRN   lidocaine  PRN   oxyCODONE-acetaminophen 2 tablet Q6H PRN   simethicone 80 mg Q6H PRN   sodium chloride 100 mL Q1H PRN   sodium chloride 250 mL PRN   traMADol 50 mg Q6H PRN           Physical Exam:     Filed Vitals:    08/16/13 1206 08/16/13 1221 08/16/13 1237 08/16/13  1318   BP: 116/66 129/60 132/64 123/73   Pulse: 93 93 91 107   Temp:    97.5 F (36.4 C)   TempSrc:       Resp:       Height:       Weight:       SpO2:         Temp (24hrs), Avg:98.1 F (36.7 C), Min:97 F (36.1 C), Max:99.4 F (37.4 C)    General Appearance:  fatigued, no acute distress  Lungs:    diminished  Left, RLL crackles  Heart:    regular rhythm,  no murmur   Abdomen:   soft, non-tender  Extremities:   1+ RLE >LLE  edema  Neurologic:  Drowsy but arousable and oriented, moves all extremities equally  Intake/Output Summary (Last 24 hours) at 08/16/13 1347  Last data filed at 08/16/13 1318   Gross per 24 hour   Intake    480 ml   Output   3200 ml   Net  -2720 ml       Active PICC Line / CVC Line / PIV Line / Drain / Airway / Intraosseous Line / Epidural Line / ART Line / Line / Wound / Pressure Ulcer / NG/OG Tube     Name   Placement date   Placement time   Site   Days    Peripheral IV 08/07/13 Right Other (Comment)  08/07/13      Other (Comment)   2    Graft/Fistula Left          --          Labs:       Lab 08/16/13 0210 08/15/13 0925 08/14/13 0526 08/13/13 0954 08/11/13 0834 08/09/13 1530   GLU 241* 118* 200* 139* 133* 325*        Lab 08/16/13 0210 08/15/13 0925 08/14/13 0526   WBC 7.68 8.62 9.32   RBC 2.69* 2.91* 2.80*   HGB 7.8* 8.4* 8.1*   HCT 23.2* 25.2* 24.7*   MCV 86.2 86.6 88.2   MCHC 33.6 33.3 32.8   RDW 14 14 14    MPV 10.9 11.5 11.7   PLT 273 267 235       Lab 08/15/13 0925 08/13/13 0954 08/11/13 0834   ALT 21 35 --   AST 15 28 --   GLOB 3.8* 3.9* --   ALB 2.3* 2.6* 2.4*       Lab 08/15/13 0925   CK 29   TROPI <0.01   TROPT --   CKMBINDEX --       Lab 08/15/13 0925   BILITOTAL 0.3   BILIDIRECT 0.2   PROT 6.1   ALB 2.3*   ALT 21   AST 15       Lab 08/16/13 0948 08/16/13 0210   PT -- 18.8*   INR -- 1.6*   PTT 94* --       Lab 08/16/13 0210 08/15/13 0925 08/14/13 0526 08/13/13 0954 08/11/13 0834   NA 130* 129* 129* -- --   K 4.4 4.5 4.6 -- --   CL 95* 94* 93* -- --   CO2 24 24 26  -- --   BUN  49* 40* 24* -- --   CREAT 4.7* 4.5* 3.6* -- --   CA 8.2* 8.7 8.7 -- --   ALB -- 2.3* -- 2.6* 2.4*   PROT -- 6.1 -- 6.5 --   BILITOTAL -- 0.3 -- 0.5 --   ALKPHOS -- 72 -- 77 --   ALT -- 21 -- 35 --   AST -- 15 -- 28 --   GLU 241* 118* 200* -- --       Microbiology:     Pleural fluid - no orgs    Rads:   Radiological Procedure reviewed    8/25 CXR  Significant interval large enlargement of the left pleural  effusion    8/22 CXR  No interval change in the bilateral pleural effusions and  atelectasis    8/17 CXR   Stable exam with small right pleural effusion and moderate left pleural  effusion.    Signed:  Chyrl Civatte NP  [facility]    Agree with above. Hemodialysis tomorrow. Discussed with family.

## 2013-08-16 NOTE — Progress Notes (Signed)
Date Time: 08/16/2013 10:50 AM  Patient Name: Mary [name],Mary [name]    Subjective:   Denies any active complaints.  States significant improvement in breathing after thoracentesis yesterday.  No chest pain.   No nausea or abdominal discomfort.     Objective:   VITALS:  Filed Vitals:    08/15/13 2031 08/16/13 0039 08/16/13 0411 08/16/13 0700   BP: 185/72 113/47 136/66    Pulse: 99 70 97    Temp: 99.4 F (37.4 C) 98.1 F (36.7 C) 98.3 F (36.8 C)    TempSrc:       Resp:       Height:       Weight:    92.987 kg (205 lb)   SpO2: 95% 96% 97%      97% on 2LNC    Intake and Output Summary (Last 24 hours) at Date Time    Intake/Output Summary (Last 24 hours) at 08/16/13 0836  Last data filed at 08/16/13 0700   Gross per 24 hour   Intake    480 ml   Output    900 ml   Net   -420 ml     PHYSICAL EXAM:  GEN APPEARANCE: NAD  HEENT: PERLA; EOMI; Conjunctiva Clear  NECK: Supple; FROM; no JVD  CVS: RRR, S1, S2; No M/[name]/R  LUNGS: decreased bs in left base; no rales or rhonchi  ABD: Soft; NT/ND; Normoactive BS  EXT: trace BLE edema  SKIN: No rash or lesions  NEURO: CN 2-12 intact; No gross focal neurological deficits  PSYCH: Alert and oriented x3. Appropriate affect.    Labs and Diagnostic Data:       Lab 08/16/13 1746 08/16/13 0210 08/15/13 0925   WBC 7.69 7.68 8.62   HGB 8.6* 7.8* 8.4*   HCT 26.2* 23.2* 25.2*   MCV 86.8 86.2 86.6   PLT 257 273 267       Lab 08/16/13 1746 08/16/13 0210 08/15/13 0925 08/14/13 0526 08/13/13 0954   NA 136 130* 129* 129* 130*   K 4.2 4.4 4.5 4.6 4.6   CL 98 95* 94* 93* 93*   CO2 27 24 24 26 25    BUN 22* 49* 40* 24* 34*   CREAT 2.8* 4.7* 4.5* 3.6* 4.3*   GLU 228* 241* 118* 200* 139*   CA 9.0 8.2* 8.7 8.7 9.0       Results     Procedure Component Value Units Date/Time    POCT glucose (AC and HS) [id number]  (Abnormal) Collected:08/16/13 0611     POCT Glucose WB 167 (A) mg/dL Updated:08/16/13 0000000    Basic Metabolic Panel 0000000  (Abnormal) Collected:08/16/13 0210    Specimen Information:Blood  Updated:08/16/13 0311     Glucose 241 (H) mg/dL      BUN 49 (H) mg/dL      Creatinine 4.7 (H) mg/dL      CALCIUM 8.2 (L) mg/dL      Sodium 130 (L)      Potassium 4.4      Chloride 95 (L)      CO2 24      Anion Gap 11.0     GFR [id number] Collected:08/16/13 0210     EGFR 11.1 Updated:08/16/13 0311    APTT [id number]  (Abnormal) Collected:08/16/13 0210     PTT 114 (H) Updated:08/16/13 0309    Prothrombin time/INR [id number]  (Abnormal) Collected:08/16/13 0210    Specimen Information:Blood Updated:08/16/13 0228     PT 18.8 (H)      PT  INR 1.6 (H)      PT Anticoag. Given Within 48 hrs. heparin     CBC and differential [id number]  (Abnormal) Collected:08/16/13 0210    Specimen Information:Blood / Blood Updated:08/16/13 0217     WBC 7.68      RBC 2.69 (L)      Hgb 7.8 (L) [name]/dL      Hematocrit 23.2 (L) %      MCV 86.2 fL      MCH 29.0 pg      MCHC 33.6 [name]/dL      RDW 14 %      Platelets 273      MPV 10.9 fL      Neutrophils 56 %      Lymphocytes Automated 29 %      Monocytes 12 %      Eosinophils Automated 3 %      Basophils Automated 0 %      Immature Granulocyte 0 %      Nucleated RBC 0      Neutrophils Absolute 4.32      Abs Lymph Automated 2.23      Abs Mono Automated 0.89      Abs Eos Automated 0.22      Absolute Baso Automated 0.02      Absolute Immature Granulocyte 0.00     POCT glucose (AC and HS) [id number]  (Abnormal) Collected:08/15/13 2228     POCT Glucose WB 243 (A) mg/dL Updated:08/15/13 2228    Body fluid culture [id number] Collected:08/15/13 1630    Specimen Information:Body Fluid / Pleural Fluid Updated:08/15/13 2026    Narrative:    To be collected in IR  ORDER#: [id number]                                    ORDERED BY: Porfirio Oar  SOURCE: Pleural Fluid pleural fluid                  COLLECTED:  08/15/13 16:30  ANTIBIOTICS AT COLL.:                                RECEIVED :  08/15/13 19:29  Stain, Gram                                FINAL       08/15/13 20:25  08/15/13   Rare WBCs              Rare Epithelial cells             No organisms seen  Culture Body Fluid                         PENDING      Protein, Body Fluid [id number] Collected:08/15/13 1630    Specimen Information:Body Fluid / Pleural Fluid Updated:08/15/13 1936     Body Fluid Protein 4.8 [name]/dL     Narrative:    To be collected in IR    Amylase, Body Fluid [id number] Collected:08/15/13 1630    Specimen Information:Body Fluid / Pleural Fluid Updated:08/15/13 1935     Body Fluid Source: Pleural      Body Fluid Amylase 19 U/L     Narrative:    To be collected in IR  Lactate Dehydrogenase (LDH), Body Fluid [id number] Collected:08/15/13 1630    Specimen Information:Body Fluid / Pleural Fluid Updated:08/15/13 1935     Body Fluid LDH 230 U/L     Narrative:    To be collected in IR    Anaerobic culture [id number] Collected:08/15/13 1630    Specimen Information:Body Fluid / Pleural Fluid Updated:08/15/13 1929    Narrative:    To be collected in IR    Specific Gravity, Body Fluid [id number] Collected:08/15/13 1630    Specimen Information:Body Fluid / Pleural Fluid Updated:08/15/13 1916     Body Fluid Source: Pleural Fluid      Specific Gravity, Body Fluid 103.100     Narrative:    To be collected in IR    APTT [id number] Collected:08/15/13 1830     PTT 35 Updated:08/15/13 1904     APTT Anticoag. Given w/i 48 hrs. heparin & war     Body Fluid Cell Count [id number] Collected:08/15/13 1630    Specimen Information:Body Fluid / Pleural Fluid Updated:08/15/13 1810     Body Fluid WBC 1158      Body Fluid RBC 21000      Body Fluid Source: Pleural      Body Fluid Polymorphonuclear Cell 46 %      Body Fluid Lymphocytes 38 %      Body Fluid Mononuclear Cells 16 %     Narrative:    To be collected in IR    POCT glucose (AC and HS) [id number]  (Abnormal) Collected:08/15/13 1629     POCT Glucose WB 160 (A) mg/dL Updated:08/15/13 1729    Hepatic function panel (LFT) [id number]  (Abnormal) Collected:08/15/13 0925     Bilirubin, Total 0.3 mg/dL  Updated:08/15/13 1336     Bilirubin, Direct 0.2 mg/dL      Bilirubin, Indirect 0.1 mg/dL      AST (SGOT) 15 U/L      ALT 21 U/L      Alkaline Phosphatase 72 U/L      Protein, Total 6.1 [name]/dL      Albumin 2.3 (L) [name]/dL      Globulin 3.8 (H) [name]/dL      Albumin/Globulin Ratio 0.6 (L)     Lactate dehydrogenase [id number] Collected:08/15/13 0925     LDH 127 U/L Updated:08/15/13 1336    POCT glucose (AC and HS) [id number]  (Abnormal) Collected:08/15/13 1139     POCT Glucose WB 184 (A) mg/dL Updated:08/15/13 1139    Troponin I [id number] Collected:08/15/13 0925    Specimen Information:Blood Updated:08/15/13 1017     Troponin I <0.01 ng/mL     Basic Metabolic Panel [id number]  (Abnormal) Collected:08/15/13 0925    Specimen Information:Blood Updated:08/15/13 1013     Glucose 118 (H) mg/dL      BUN 40 (H) mg/dL      Creatinine 4.5 (H) mg/dL      CALCIUM 8.7 mg/dL      Sodium 129 (L)      Potassium 4.5      Chloride 94 (L)      CO2 24      Anion Gap 11.0     GFR [id number] Collected:08/15/13 0925     EGFR 11.7 Updated:08/15/13 1013    CREATINE KINASE LEVEL (CK) [id number] Collected:08/15/13 0925    Specimen Information:Blood Updated:08/15/13 1013     Creatine Kinase (CK) 29 U/L     APTT [id number]  (Abnormal) Collected:08/15/13 0925     PTT 71 (H) Updated:08/15/13 0958    Prothrombin time/INR [id number]  (Abnormal) Collected:08/15/13  [id number]    Specimen Information:Blood Updated:08/15/13 0956     PT 17.5 (H)      PT INR 1.5 (H)      PT Anticoag. Given Within 48 hrs. heparin     CBC and differential [id number]  (Abnormal) Collected:08/15/13 0925    Specimen Information:Blood / Blood Updated:08/15/13 0950     WBC 8.62      RBC 2.91 (L)      Hgb 8.4 (L) [name]/dL      Hematocrit 25.2 (L) %      MCV 86.6 fL      MCH 28.9 pg      MCHC 33.3 [name]/dL      RDW 14 %      Platelets 267      MPV 11.5 fL      Neutrophils 55 %      Lymphocytes Automated 30 %      Monocytes 12 %      Eosinophils Automated 3 %      Basophils Automated 0 %       Immature Granulocyte 0 %      Nucleated RBC 0      Neutrophils Absolute 4.77      Abs Lymph Automated 2.54      Abs Mono Automated 1.06      Abs Eos Automated 0.23      Absolute Baso Automated 0.02      Absolute Immature Granulocyte 0.01         Radiological Studies:   Imaging reviewed.  CXR (8/25): significant interval large enlargement of left pleural effusion.    Current Medications:     Current Facility-Administered Medications   Medication Dose Route Frequency   . amLODIPine  2.5 mg Oral Daily   . aspirin  81 mg Oral Daily   . atorvastatin  10 mg Oral Daily   . b complex-vitamin c-folic acid  1 tablet Oral Daily   . bisacodyl  5 mg Oral Daily   . colchicine  0.6 mg Oral Daily   . gabapentin  300 mg Oral TID   . insulin glargine  12 Units Subcutaneous QHS   . lidocaine  1 patch Transdermal Q24H   . pantoprazole  40 mg Oral QAM AC   . pregabalin  75 mg Oral Q12H [place]   . warfarin  7.5 mg Oral Daily at 1800     Assessment and Plan:   Mary [name] is a 67 y.o. female with PMH significant for recent hospitalizations in the setting of viral pericarditis with moderate pericardial effusion and bilateral pleural effusions who was previously diagnosed with left lower extremity deep venous thrombosis, history of end-stage renal disease on hemodialysis, hypertension, hyperlipidemia, and insulin-dependent diabetes who was transferred from [facility] this morning secondary to recurrent chest pain with associated shortness of breath.     1. Large left pleural effusion.  S/p thoracentesis on 8/25 with 1100cc serosanguinous fluid removed.  Fluid analysis c/w exudate.  Cytology and fluid culture pending.  Appreciate pulmonary asst.   2. 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  Cardiology and Nephrology following.  HD per Nephrology.  3. Pericardial effusion. The patient's blood pressure is currently stable. She does not have evidence of tamponade.  Appreciate Cardiology asst.  Recent 2DE  as outpt with report of small pericardial effusion, confirmed by Dr. Elder Negus.  4. Hypervolemic hyponatremia.  HD for volume removal.   5. Recent viral pericarditis. Continue colchicine.   6. Anemia of  chronic disease. The patient denies any episodes of black or bloody stools. Continue Aranesp per nephrology.   7. End-stage renal disease on hemodialysis. Dr. Leonette Most following.  8. HTN. Continue home medications.   9. IDDM.  Continue Lantus. We will continue to monitor Accu-Cheks q.a.c. and nightly and administer sliding scale as needed.  10. Recent left lower extremity deep venous thrombosis. The patient was previously on Coumadin; however, INR subtherapeutic. Bridging to therapeutic INR on heparin gtt.   11. Gastrointestinal prophylaxis. Pepcid.   12. Deep venous thrombosis prophylaxis. Heparin gtt/Coumadin.    DISPOSITION:   Continue to monitor closely on telemetry.     Laroy Apple, MD

## 2013-08-16 NOTE — Plan of Care (Signed)
Problem: Moderate/High Fall Risk Score >/=15  Goal: Patient will remain free of falls  Outcome: Progressing  Pt ambulates with steady gait with minimal assist.fall precautions maintained,needs met with hourly rounds.    Problem: Potential for Compromised Hemodynamic Status  Goal: Stable vital signs and fluid balance  Outcome: Progressing  Pt maintained SPO2 > 92 % on 2 L NC.denies nay SOB tonight.c/o lt back pain and discomfort ,medicated with percocet, pain managed well.vitals stable,temp 99.4

## 2013-08-16 NOTE — Progress Notes (Signed)
Dialysis Treatment Note    Patient:  Mary [name] MRN#:  [id number]  Unit/Room/Bed:  [id number]   Isolation: None       Allergies:   Allergies   Allergen Reactions   . Penicillins Hives   . Shrimp (Shellfish Allergy) Swelling     Code Status: Full Code    Problem List:   Patient Active Problem List    Diagnosis Date Noted   . Dyspnea 07/30/2013   . Chest pain 07/30/2013   . Pleural effusion 07/30/2013   . Viral pericarditis 07/21/2013   . Chest pain at rest 07/19/2013   . Cellulitis of left leg 06/21/2013   . ESRD (end stage renal disease) 06/21/2013   . DM (diabetes mellitus) 06/21/2013   . Hypertension 06/21/2013   . Cellulitis 06/21/2013       Dialysis Order:  Active Orders   Dialysis    Hemodialysis inpatient     Frequency: Once     Start Date/Time: 08/16/13 [id number]     Number of Occurrences:  1 Occurrences     Order Questions:     . Date of Dialysis 08/16/2013     . K+ (Initial) 2 mEq     . KPP (Per Protocol) Yes     . Ca++ 2.5 mEq     . Bicarb 35 mEq     . Na+ 138 mEq     . Dialyzer F160     . Dialysate Temperature (C) 37     . BFR-As tolerated to a maximum of: 350 mL/min     . DFR 700 mL/min     . Duration of Treatment 3.5 Hours     . Fluid Removal (L) and Dry Weight (Kg) Other (please specify) (Comment: 1-1.5)     . Access Type-Location AVF-L        Dialysis Treatment Type:    Time out/Safety Check: Time Out/Safety Check Completed: Yes (08/16/13 0920)  [facility] Consent: Consent for HD signed for this hospitalization: Yes (08/16/13 0920)  Blood Consent (if applicable): Blood Consent Verified: Not Applicable ([id number])    Dialysis Access:      Graft/Fistula Left (Active)   Fistula/ Graft Assessment Abnormalities WDL 08/16/2013  1:18 PM   Needle Size 15 Gauge 08/16/2013  1:18 PM   Cannulation Sites held Arterial (min) 5 08/16/2013  1:18 PM   Cannulation Sites held Venous (min) 5 08/16/2013  1:18 PM   Hemostasis Achieved Yes 08/16/2013  1:18 PM       General Assessments:     08/16/13 1318   Treatment  Summary   Time Off Machine 1250   Duration of Treatment (Hours) 3.5   Dialyzer Clearance Lightly streaked   Fluid Volume Off (mL) 2400    Prime Volume (mL) 200    Rinseback Volume (mL) 200    Fluid Given: Normal Saline (mL) 0    Fluid Given: PRBC  0 mL   Fluid Given: Albumin (mL) 0    Fluid Given: Other (mL) 0    Total Fluid Given 400    Hemodialysis Net Fluid Removed 2000    Post Treatment Assessment   Patient Response to Treatment pt tolerated dialysis well   Information for Next Treatment no heparin used, on heparin drip   Additional Dialyzer Used no   Graft/Fistula Left   No Placement Date or Time found.   Present on Admission?: Yes  Orientation: Left  Graft/Fistula Location: Upper Arm   Fistula/ Graft Assessment Abnormalities WDL  Needle Size 15 Gauge   Cannulation Sites held Arterial (min) 5   Cannulation Sites held Venous (min) 5   Hemostasis Achieved Yes   Vitals   Temp 97.5 F (36.4 C)   Heart Rate ! 107    BP 123/73 mmHg   O2 Device Nasal cannula   Assessment   Mental Status Alert;Oriented;Cooperative   Cardiac Regularity Regular   Cardiac Symptoms None   Respiratory Pattern Regular;Easy   Bilateral Breath Sounds Clear   Generalized Edema Non Pitting Edema   General Skin Color Appropriate for ethnicity   Skin Condition/Temp Warm;Dry   Mobility Ambulatory with Assistance   Education   Person taught Patient   Knowledge basis Substantial   Topics taught Procedure   Teaching Tools Explain   Bedside Nurse Communication   Name of bedside RN - pre dialysis Mikki Harbor   Name of bedside RN - post dialysis Mikki Harbor      08/16/13 0920   Bedside Nurse Communication   Name of bedside RN - pre dialysis Mikki Harbor   Treatment Initiation- With Dialysis Precautions   Time Out/Safety Check Completed Yes   Consent for HD signed for this hospitalization Y   Blood Consent Verified N/A   Dialysis Precautions All Connections Secured;Saline Line Double Clamped;Venous Parameters Set;Arterial Parameters Set;Air Foam Detecctor  Engaged   Dialysis Treatment Type Routine;Bedside   Special Considerations on heparin drip   Is patient diabetic? Yes   RO/Hemodialysis Architectural technologist   Is Total Chlorine less than 0.1 ppm? Yes   Orignial Total Chlorine Testing Time 0820   RO/Hemodialysis Archivist Number 1    RO # 1    Water Hardness 0    pH 7.8    Pressure Test Verified Yes   Alarms Verified Passed   Machine Temperature 98.6 F (37 C)   Alarms Verified Yes   Hemodialysis Conductivity (Machine) 14.2    Hemodialysis Conductivity (Meter) 14    Dialyzer Lot Number [id number])   Tubing Lot Number [id number])   RO Machine Log Completed Yes   Hepatitis Status   HBsAg (Antigen) Result Negative   HBsAg Date Drawn 08/07/13   HBsAg Repeat Draw Due Date 09/04/13   HBsAb (Antibody) Result Non-Reactive   Vitals   Temp 97 F (36.1 C)   Heart Rate 93    BP 114/55 mmHg   SpO2 99 %   O2 Device Nasal cannula   Assessment   Mental Status Alert;Oriented;Cooperative;Follows Commands   Cardiac (WDL) WDL   Cardiac Regularity Regular   Cardiac Symptoms None   Cardiac Rhythm Normal Sinus Rhythm   Respiratory  X   Respiratory Pattern Regular;Dyspnea with exertion   R Breath Sounds Crackles   L Breath Sounds Clear;Diminished   Edema  WDL   General Skin Color Appropriate for ethnicity   Skin Condition/Temp Warm;Dry   Mobility Ambulatory with Assistance   Graft/Fistula Left   No Placement Date or Time found.   Present on Admission?: Yes  Orientation: Left  Graft/Fistula Location: Upper Arm   Fistula/ Graft Assessment Abnormalities WDL   Needle Size 15 Gauge   Pain Assessment   Charting Type Assessment   Pain Scale Used Numeric Scale (0-10)   Numeric Pain Scale   Pain Score 0   Hemodialysis Comments   Pre-Hemodialysis Comments (lidocaine cream applied before hd treatment)   Hemodialysis History Information   Outpatient Dialysis Unit [facility]   Outpatient Dialysis Schedule TTS   Outpatient  Nephrologist [place]             Pain Assessment:  Pain Assessment  Charting Type: Assessment (08/16/13 0920)  Pain Scale Used: Numeric Scale (0-10) (08/16/13 0920)    Labs Values:    HBsAg Result:HBsAg (Antigen) Result: Negative (08/16/13 0920)  HBsAg Date Drawn: HBsAg Date Drawn: 08/07/13 (08/16/13 0920)  HBsAg Next Due Date:HBsAg Repeat Draw Due Date: 09/04/13 (08/16/13 0920)    Lab Results   Component Value Date    BUN 49* 08/16/2013    NA 130* 08/16/2013    K 4.4 08/16/2013    CREAT 4.7* 08/16/2013    CO2 24 08/16/2013    CA 8.2* 08/16/2013    PHOS 4.6 08/11/2013    GLU 241* 08/16/2013    ALB 2.3* 08/15/2013    HGB 7.8* 08/16/2013    HCT 23.2* 08/16/2013    WBC 7.68 08/16/2013    PLT 273 08/16/2013    PTT 94* 08/16/2013    PT 18.8* 08/16/2013    INR 1.6* 08/16/2013         Current Diet Order  Diet consistent carbohydrate and renal 50 GM Protein     RO/Hemodialysis Machine Safety Checks - Before Each Treatment:  RO/Hemodialysis Archivist Number: 1  (08/16/13 0920)  RO #: 1  (08/16/13 0920)  Water Hardness: 0  (08/16/13 0920)  pH: 7.8  (08/16/13 0920)  Pressure Test Verified: Yes (08/16/13 0920)  Alarms Verified: Passed (08/16/13 0920)  Machine Temperature: 98.6 F (37 C) (08/16/13 0920)  Alarms Verified: Yes (08/16/13 0920)  Hemodialysis Conductivity (Machine): 14.2  (08/16/13 0920)  Hemodialysis Conductivity (Meter): 14  (08/16/13 0920)  RO Machine Log Completed: Yes (08/16/13 0920)    Chlorine Testing:  RO/Hemodialysis Machine Safety Checks  Is Total Chlorine less than 0.1 ppm?: Yes (08/16/13 0920)  Orignial Total Chlorine Testing Time: 0820 (08/16/13 0920)  At 4 Hour Total Chlorine Testing Time: [id number] (08/11/13 1419)    Vitals and Weight:  Patient Vitals for the past 4 hrs:   BP Temp Pulse   08/16/13 1318 123/73 mmHg 97.5 F (36.4 C) 107    08/16/13 1237 132/64 mmHg - 91    08/16/13 1221 129/60 mmHg - 93    08/16/13 1206 116/66 mmHg - 93    08/16/13 1154 136/63 mmHg - 95    08/16/13 1135 125/62 mmHg - 92    08/16/13 1121 134/62 mmHg - 92    08/16/13  1106 135/69 mmHg - 91    08/16/13 1051 131/61 mmHg - 91    08/16/13 1035 127/59 mmHg - 92    08/16/13 1021 124/59 mmHg - 93    08/16/13 1006 133/61 mmHg - 91    08/16/13 0951 127/61 mmHg - 97    08/16/13 0935 119/58 mmHg - 90         Dialysis Weight  Pre-Treatment Weight (Kg): 92  (08/13/13 0950)  Scale Type: In Bed Sling Scale (08/13/13 0950)  Post-Treatment Weight (Kg): 213.7  (08/09/13 1845)    Treatment:     08/16/13 1121 08/16/13 1135 08/16/13 1154   Vitals   Heart Rate 92  92  95    BP 134/62 mmHg 125/62 mmHg 136/63 mmHg   Machine Metrics   Blood Flow Rate (mL/min) 350 mL/min 350 mL/min 350 mL/min   Arterial Pressure (mmHg) -140 mmHg -150 mmHg -150 mmHg   Venous Pressure (mmHg) 120  130  120    Dialysate Flow Rate (mL/min) 700 mL/min  700 mL/min 700 mL/min   Transmembrane Pressure (mmHg) 100 mmHg 100 mmHg 90 mmHg   Ultrafiltration Rate (mL/Hr) 640 mL/hr 640 mL/hr 640 mL/hr   Fluid Removal (ml) 1236 ml 1388 ml 1538 ml   Hemodialysis Comments   Arteriovenous Lines Secure Yes Yes Yes   Comments --  pt awake, no complaints RN's restarted heparin drip at 1141, MD at bedside        08/16/13 1206 08/16/13 1221   Vitals   Heart Rate 93  93    BP 116/66 mmHg 129/60 mmHg   Machine Metrics   Blood Flow Rate (mL/min) 350 mL/min 350 mL/min   Arterial Pressure (mmHg) -150 mmHg -150 mmHg   Venous Pressure (mmHg) 120  120    Dialysate Flow Rate (mL/min) 700 mL/min 700 mL/min   Transmembrane Pressure (mmHg) 90 mmHg 90 mmHg   Ultrafiltration Rate (mL/Hr) 840 mL/hr 840 mL/hr   Fluid Removal (ml) 1746 ml 1950 ml   Hemodialysis Comments   Arteriovenous Lines Secure Yes Yes   Comments no complaints  --       08/16/13 1021 08/16/13 1035 08/16/13 1051   Vitals   Heart Rate 93  92  91    BP 124/59 mmHg 127/59 mmHg 131/61 mmHg   Machine Metrics   Blood Flow Rate (mL/min) 350 mL/min 350 mL/min 350 mL/min   Arterial Pressure (mmHg) -140 mmHg -140 mmHg -140 mmHg   Venous Pressure (mmHg) 120  120  120    Dialysate Flow Rate (mL/min) 700  mL/min 700 mL/min 700 mL/min   Transmembrane Pressure (mmHg) 110 mmHg 100 mmHg 100 mmHg   Ultrafiltration Rate (mL/Hr) 640 mL/hr 640 mL/hr 640 mL/hr   Fluid Removal (ml) 595 ml 752 ml 915 ml   Dialysate K (mEq/L) 2 mEq/L 2 mEq/L --    Dialysate CA (mEq/L) 2.5 mEq/L 2.5 mEq/L --    Hemodialysis Comments   Arteriovenous Lines Secure Yes Yes Yes   Comments RN at bedside stopping heparin drip. Pt resting comfortably. pt sleeping needles intact       08/16/13 1106   Vitals   Heart Rate 91    BP 135/69 mmHg   Machine Metrics   Blood Flow Rate (mL/min) 350 mL/min   Arterial Pressure (mmHg) -140 mmHg   Venous Pressure (mmHg) 120    Dialysate Flow Rate (mL/min) 700 mL/min   Transmembrane Pressure (mmHg) 100 mmHg   Ultrafiltration Rate (mL/Hr) 640 mL/hr   Fluid Removal (ml) 1070 ml   Dialysate K (mEq/L) 2 mEq/L   Dialysate CA (mEq/L) 2.5 mEq/L   Hemodialysis Comments   Arteriovenous Lines Secure Yes   Comments pt sleeping      08/16/13 0920 08/16/13 0935 08/16/13 0951   Vitals   Temp 97 F (36.1 C) --  --    Heart Rate 93  90  97    BP 114/55 mmHg 119/58 mmHg 127/61 mmHg   SpO2 99 % 99 % 98 %   O2 Device Nasal cannula --  Nasal cannula   Journalist, newspaper Yes --  --    Blood Flow Rate (mL/min) 350 mL/min 350 mL/min 350 mL/min   Arterial Pressure (mmHg) -130 mmHg -140 mmHg -140 mmHg   Venous Pressure (mmHg) 110  110  120    Dialysate Flow Rate (mL/min) 700 mL/min 700 mL/min 700 mL/min   Transmembrane Pressure (mmHg) 100 mmHg 90 mmHg 100 mmHg   Ultrafiltration Rate (mL/Hr) 570 mL/hr 570 mL/hr  570 mL/hr   Fluid Removal (ml) 0 ml 158 ml 302 ml   Dialysate K (mEq/L) 2 mEq/L 2 mEq/L 2 mEq/L   Dialysate CA (mEq/L) 2.5 mEq/L 2.5 mEq/L 2.5 mEq/L   Hemodialysis Comments   Arteriovenous Lines Secure Yes Yes Yes   Comments Treatment started without difficulty pt resting, responsive, no hd complaints pt awake, talking on the phone       08/16/13 1006   Vitals   Temp --    Heart Rate 91    BP 133/61  mmHg   SpO2 --    O2 Device --    Journalist, newspaper --    Blood Flow Rate (mL/min) 350 mL/min   Arterial Pressure (mmHg) -140 mmHg   Venous Pressure (mmHg) 120    Dialysate Flow Rate (mL/min) 700 mL/min   Transmembrane Pressure (mmHg) 100 mmHg   Ultrafiltration Rate (mL/Hr) 570 mL/hr   Fluid Removal (ml) 442 ml   Dialysate K (mEq/L) 2 mEq/L   Dialysate CA (mEq/L) 2.5 mEq/L   Hemodialysis Comments   Arteriovenous Lines Secure Yes   Comments pt resting comfortably            Intake/Output:  I/O this shift:  In: -   Out: 2300 [Urine:300; Other:2000]      Medications:  Current Facility-Administered Medications   Medication Dose Route Last Rate Last Dose   . sodium chloride 0.9 % bolus 100 mL  100 mL Other       . sodium chloride 0.9 % bolus 250 mL  250 mL Intravenous            Education:  Education  Person taught: Patient (08/16/13 1318)  Knowledge basis: Substantial (08/16/13 1318)  Topics taught: Procedure (08/16/13 1318)  Teaching Tools: Explain (08/16/13 1318)    Primary Nurse Communication:  Bedside Nurse Communication  Name of bedside RN - pre dialysis: Mikki Harbor (08/16/13 1318)  Name of bedside RN - post dialysis: Mikki Harbor (08/16/13 1318)      [facility] nurse signature/date/time: Reynaldo Minium 08/16/13 1320

## 2013-08-16 NOTE — Progress Notes (Signed)
Mary [name] is a 67 y.o. female patient with PMH notable for  DM, HTN, ESRD on hemodialysis, admitted because of SOB.    Active Problems:   Chest pain    Past Medical History   Diagnosis Date   . Diabetes mellitus without complication    . Hyperlipidemia    . Hypertensive disorder    . Chronic kidney disease      dialysis t-th-sat   . Arthritis    . Cataracts, bilateral    . GERD (gastroesophageal reflux disease)    . Acute deep vein thrombosis (DVT) of distal vein of left lower extremity    . Hemodialysis access site with mature fistula      Current Facility-Administered Medications   Medication Dose Route Frequency Provider Last Rate Last Dose   . acetaminophen (TYLENOL) tablet 650 mg  650 mg Oral Q6H PRN Laroy Apple, MD       . alum & mag hydroxide-simethicone (MAALOX PLUS) 200-200-20 mg/5 mL suspension 30 mL  30 mL Oral Q4H PRN Noralyn Pick, MD       . amLODIPine [facility]) tablet 2.5 mg  2.5 mg Oral Daily Noralyn Pick, MD   2.5 mg at 08/16/13 1357   . aspirin chewable tablet 81 mg  81 mg Oral Daily Laroy Apple, MD   81 mg at 08/16/13 1358   . atorvastatin (LIPITOR) tablet 10 mg  10 mg Oral Daily Laroy Apple, MD   10 mg at 08/15/13 2026   . b complex-vitamin c-folic acid (NEPHRO-VITE) tablet 1 tablet  1 tablet Oral Daily Laroy Apple, MD   1 tablet at 08/16/13 1356   . bisacodyl (DULCOLAX) EC tablet 5 mg  5 mg Oral Daily Noralyn Pick, MD   5 mg at 08/16/13 1357   . colchicine tablet 0.6 mg  0.6 mg Oral Daily Laroy Apple, MD   0.6 mg at 08/16/13 1358   . dextrose (GLUCOSE) 40 % oral gel 15 g  15 g Oral PRN Laroy Apple, MD       . dextrose 50 % bolus 25 mL  25 mL Intravenous PRN Laroy Apple, MD       . gabapentin (NEURONTIN) capsule 300 mg  300 mg Oral TID Laroy Apple, MD   300 mg at 08/16/13 1357   . glucagon (rDNA) (GLUCAGEN) injection 1 mg  1 mg Intramuscular PRN Laroy Apple, MD       . heparin (porcine) injection 3,000 Units  3,000 Units Intravenous PRN Noralyn Pick, MD   3,000 Units at 08/15/13 2012   . heparin 25000 units in dextrose 5% 500 mL infusion (premix)  1,000 Units/hr Intravenous Continuous Noralyn Pick, MD 12 mL/hr at 08/16/13 1141 600 Units/hr at 08/16/13 1141   . insulin glargine (LANTUS) injection 12 Units  12 Units Subcutaneous QHS Laroy Apple, MD   12 Units at 08/15/13 2234   . insulin regular (HumuLIN R,NovoLIN R) injection 1-8 Units  1-8 Units Subcutaneous TID AC PRN Laroy Apple, MD   1 Units at 08/16/13 1400   . lidocaine (LIDODERM) 5 % 1 patch  1 patch Transdermal Q24H Quintin Alto, Kittie Plater A, MD   1 patch at 08/16/13 1537   . lidocaine (LMX PLUS 4%) 4 % dressing   Topical PRN Alric Quan, MD       . lidocaine (LMX PLUS 4%) 4 % dressing   Topical PRN  Alric Quan, MD   1 each at 08/16/13 [phone]   . oxyCODONE-acetaminophen (PERCOCET) 5-325 MG per tablet 2 tablet  2 tablet Oral Q6H PRN Noralyn Pick, MD   2 tablet at 08/15/13 2233   . pantoprazole (PROTONIX) EC tablet 40 mg  40 mg Oral QAM AC Laroy Apple, MD   40 mg at 08/16/13 0840   . pregabalin (LYRICA) capsule 75 mg  75 mg Oral Q12H [place] Laroy Apple, MD   75 mg at 08/16/13 1358   . simethicone (MYLICON) chewable tablet 80 mg  80 mg Oral Q6H PRN Noralyn Pick, MD   80 mg at 08/13/13 1452   . [EXPIRED] sodium chloride 0.9 % bolus 100 mL  100 mL Other Q1H PRN Alric Quan, MD       . [EXPIRED] sodium chloride 0.9 % bolus 250 mL  250 mL Intravenous PRN Alric Quan, MD       . traMADol Veatrice Bourbon) tablet 50 mg  50 mg Oral Q6H PRN Laroy Apple, MD   50 mg at 08/13/13 2114   . warfarin (COUMADIN) tablet 7.5 mg  7.5 mg Oral Daily at 1800 Noralyn Pick, MD   7.5 mg at 08/15/13 1802     Allergies   Allergen Reactions   . Penicillins Hives   . Shrimp (Shellfish  Allergy) Swelling     Blood pressure 131/63, pulse 60, temperature 99.5 F (37.5 C), temperature source Temporal Artery, resp. rate 20, height 1.626 m (5\' 4" ), weight 92.987 kg (205 lb), SpO2 92.00%.    Subjective:  Symptoms:  She reports shortness of breath.    Diet:  Adequate intake.    Activity level: Impaired due to weakness.      Objective:  General Appearance:  In no acute distress.    Vital signs: (most recent): Blood pressure 131/63, pulse 60, temperature 99.5 F (37.5 C), temperature source Temporal Artery, resp. rate 20, height 1.626 m (5\' 4" ), weight 92.987 kg (205 lb), SpO2 92.00%.    Output: No urine output and producing stool.    Lungs:  There are decreased breath sounds.    Heart: Normal rate.    Neurological: Patient is alert.    Skin:  Warm.    Abdomen: Abdomen is soft.      Results     Procedure Component Value Units Date/Time    POCT glucose (AC and HS) [id number]  (Abnormal) Collected:08/16/13 1634     POCT Glucose WB 199 (A) mg/dL Updated:08/16/13 1634    Body fluid culture [id number] Collected:08/15/13 1630    Specimen Information:Body Fluid / Pleural Fluid Updated:08/16/13 1525    Narrative:    To be collected in IR  ORDER#: [id number]                                    ORDERED BY: Porfirio Oar  SOURCE: Pleural Fluid pleural fluid                  COLLECTED:  08/15/13 16:30  ANTIBIOTICS AT COLL.:                                RECEIVED :  08/15/13 19:29  Stain, Gram  FINAL       08/15/13 20:25  08/15/13   Rare WBCs             Rare Epithelial cells             No organisms seen  Culture Body Fluid                         PRELIM      08/16/13 15:25  08/16/13   Culture no growth to date. Final report to follow      POCT glucose (AC and HS) [id number]  (Abnormal) Collected:08/16/13 1103     POCT Glucose WB 164 (A) mg/dL Updated:08/16/13 1103    APTT [id number]  (Abnormal) Collected:08/16/13 0948     PTT 94 (H) Updated:08/16/13 1007     APTT Anticoag. Given  w/i 48 hrs. heparin & war     POCT glucose (AC and HS) [id number]  (Abnormal) Collected:08/16/13 0611     POCT Glucose WB 167 (A) mg/dL Updated:08/16/13 0000000    Basic Metabolic Panel 0000000  (Abnormal) Collected:08/16/13 0210    Specimen Information:Blood Updated:08/16/13 0311     Glucose 241 (H) mg/dL      BUN 49 (H) mg/dL      Creatinine 4.7 (H) mg/dL      CALCIUM 8.2 (L) mg/dL      Sodium 130 (L)      Potassium 4.4      Chloride 95 (L)      CO2 24      Anion Gap 11.0     GFR [id number] Collected:08/16/13 0210     EGFR 11.1 Updated:08/16/13 0311    APTT [id number]  (Abnormal) Collected:08/16/13 0210     PTT 114 (H) Updated:08/16/13 0309    Prothrombin time/INR [id number]  (Abnormal) Collected:08/16/13 0210    Specimen Information:Blood Updated:08/16/13 0228     PT 18.8 (H)      PT INR 1.6 (H)      PT Anticoag. Given Within 48 hrs. heparin     CBC and differential [id number]  (Abnormal) Collected:08/16/13 0210    Specimen Information:Blood / Blood Updated:08/16/13 0217     WBC 7.68      RBC 2.69 (L)      Hgb 7.8 (L) g/dL      Hematocrit 23.2 (L) %      MCV 86.2 fL      MCH 29.0 pg      MCHC 33.6 g/dL      RDW 14 %      Platelets 273      MPV 10.9 fL      Neutrophils 56 %      Lymphocytes Automated 29 %      Monocytes 12 %      Eosinophils Automated 3 %      Basophils Automated 0 %      Immature Granulocyte 0 %      Nucleated RBC 0      Neutrophils Absolute 4.32      Abs Lymph Automated 2.23      Abs Mono Automated 0.89      Abs Eos Automated 0.22      Absolute Baso Automated 0.02      Absolute Immature Granulocyte 0.00     POCT glucose (AC and HS) [id number]  (Abnormal) Collected:08/15/13 2228     POCT Glucose WB 243 (A) mg/dL Updated:08/15/13 2228    Protein, Body Fluid [id number] Collected:08/15/13 1630  Specimen Information:Body Fluid / Pleural Fluid Updated:08/15/13 1936     Body Fluid Protein 4.8 g/dL     Narrative:    To be collected in IR    Amylase, Body Fluid [id number] Collected:08/15/13  1630    Specimen Information:Body Fluid / Pleural Fluid Updated:08/15/13 1935     Body Fluid Source: Pleural      Body Fluid Amylase 19 U/L     Narrative:    To be collected in IR    Lactate Dehydrogenase (LDH), Body Fluid [id number] Collected:08/15/13 1630    Specimen Information:Body Fluid / Pleural Fluid Updated:08/15/13 1935     Body Fluid LDH 230 U/L     Narrative:    To be collected in IR    Anaerobic culture [id number] Collected:08/15/13 1630    Specimen Information:Body Fluid / Pleural Fluid Updated:08/15/13 1929    Narrative:    To be collected in IR    Specific Gravity, Body Fluid [id number] Collected:08/15/13 1630    Specimen Information:Body Fluid / Pleural Fluid Updated:08/15/13 1916     Body Fluid Source: Pleural Fluid      Specific Gravity, Body Fluid 103.100     Narrative:    To be collected in IR    APTT [id number] Collected:08/15/13 1830     PTT 35 Updated:08/15/13 1904     APTT Anticoag. Given w/i 48 hrs. heparin & war     Body Fluid Cell Count [id number] Collected:08/15/13 1630    Specimen Information:Body Fluid / Pleural Fluid Updated:08/15/13 1810     Body Fluid WBC 1158      Body Fluid RBC 21000      Body Fluid Source: Pleural      Body Fluid Polymorphonuclear Cell 46 %      Body Fluid Lymphocytes 38 %      Body Fluid Mononuclear Cells 16 %     Narrative:    To be collected in IR    POCT glucose (AC and HS) [id number]  (Abnormal) Collected:08/15/13 1629     POCT Glucose WB 160 (A) mg/dL Updated:08/15/13 1729      Echocardiogram Adult Complete W Clr/ Dopp Waveform    08/01/2013  impression: 1. Atrial septal aneurysm is present. 2. There is a small to moderate pericardial effusion noted both anteriorly and posteriorly.  this is only slightly increased from the previous echocardiogram dated 07/20/2013. Variation of tricuspid flow velocity is noted. There is no diastolic collapse of the right ventricle noted. There is organized material note noted in the apex of the right ventricle not  previously reported. Small small pleural effusion is present. Clinical correlation is required considering these echocardiographic changes. 3. There are no other significant echogram echocardiographic changes noted.  Lennon Alstrom, MD  08/01/2013 2:03 PM     Echocardiogram Adult Complete W Color Doppler Waveform    07/20/2013   Normal left ventricular systolic function. No significant valvular heart disease. Small pericardial effusion without evidence of cardiac tamponade.    Audria Nine, MD  07/20/2013 4:09 PM     Ct Abdomen Pelvis Wo Iv/ Wo Po Cont    07/20/2013   1. Interval development of a minimal to moderate pericardial effusion and trace pleural effusions since prior exam one day earlier. 2. No detectable acute abnormalities in the abdomen or pelvis. 3. Contrast residue gallbladder, kidneys, and bladder. 4. Numerous small low-attenuation kidney lesions. 5. Remainder as above. 6. Findings discussed with Dr. Beryle Lathe by Dr. Oren Binet at 3:59 AM.  Darnell Level  Delphia Grates, MD  07/20/2013 8:23 AM     Xr Chest 2 Views    08/12/2013   1. No interval change in the bilateral pleural effusions and atelectasis.  Dennison Mascot, MD  08/12/2013 8:52 AM     Xr Chest 2 Views    08/10/2013   Small pleural effusions and basilar atelectasis, decreased from prior.  Loel Ro, MD  08/10/2013 10:22 AM     Ct Angio Chest    07/19/2013   1. No CT scan evidence for detectable pulmonary embolism. 2. Minimal cardiomegaly. 3. Minimal bibasilar lung atelectasis. 4. Remainder as above.  Sheria Lang, MD  07/19/2013 3:18 PM     Xr Chest Ap Portable    08/15/2013    1. Interval improvement of previously noted large left pleural effusion. No pneumothorax. 2. Persistent bilateral pleural effusions with basilar airspace opacities.  Darin Engels, MD  08/15/2013 5:10 PM     Xr Chest Ap Portable    08/15/2013   Significant interval large enlargement of the left pleural effusion.  Results given via telephone to Dr. Daleen Snook on 08/15/2013 at 11:31 AM  Pleas Koch, MD   08/15/2013 11:32 AM     Xr Chest Ap Portable    08/07/2013   Stable exam with small right pleural effusion and moderate left pleural effusion.  Loel Lofty. Anna Genre, MD  08/07/2013 9:38 AM     Chest Ap Portable    07/30/2013    1. New bilateral pleural effusions with bibasilar airspace opacities. 2. Stable cardiomegaly.  Darin Engels, MD  07/30/2013 4:33 PM     Xr Chest Ap Portable    07/19/2013   Hypoinflation with minimal basilar atelectasis.  Sheria Lang, MD  07/19/2013 2:45 PM     Thoracentesis    08/15/2013   Successful thoracentesis. 1100 mL of serosanguineous pleural fluid was acquired.  Kathaleen Grinder, MD  08/15/2013 4:49 PM     Assessment:  (1.chest pain  2.Left pleural effusion  3.viral pericarditis  4.ESRD  5.DM  6.MGUS  7.HTN  8.Left LE DVT  9.anemia).       Plan:   (1.S/P left thoracentesis  2.Hemodialysis with UF scheduled for tomorrow  3.monitor serum electrolytes  4.on IV heparin and coumadin anticoagulation  ).         Alric Quan  08/16/2013

## 2013-08-17 LAB — CBC AND DIFFERENTIAL
Basophils Absolute Automated: 0.04 (ref 0.00–0.20)
Basophils Automated: 0 %
Eosinophils Absolute Automated: 0.25 (ref 0.00–0.70)
Eosinophils Automated: 3 %
Hematocrit: 25.5 % — ABNORMAL LOW (ref 37.0–47.0)
Hgb: 8.4 g/dL — ABNORMAL LOW (ref 12.0–16.0)
Immature Granulocytes Absolute: 0.07 — ABNORMAL HIGH
Immature Granulocytes: 1 %
Lymphocytes Absolute Automated: 2.74 (ref 0.50–4.40)
Lymphocytes Automated: 33 %
MCH: 28.4 pg (ref 28.0–32.0)
MCHC: 32.9 g/dL (ref 32.0–36.0)
MCV: 86.1 fL (ref 80.0–100.0)
MPV: 11.3 fL (ref 9.4–12.3)
Monocytes Absolute Automated: 1.03 (ref 0.00–1.20)
Monocytes: 12 %
Neutrophils Absolute: 4.19 (ref 1.80–8.10)
Neutrophils: 51 %
Nucleated RBC: 0 (ref 0–1)
Platelets: 287 (ref 140–400)
RBC: 2.96 — ABNORMAL LOW (ref 4.20–5.40)
RDW: 14 % (ref 12–15)
WBC: 8.25 (ref 3.50–10.80)

## 2013-08-17 LAB — BASIC METABOLIC PANEL
Anion Gap: 13 (ref 5.0–15.0)
BUN: 34 mg/dL — ABNORMAL HIGH (ref 7–19)
CO2: 23 (ref 22–29)
Calcium: 8.9 mg/dL (ref 8.5–10.5)
Chloride: 99 (ref 98–107)
Creatinine: 3.5 mg/dL — ABNORMAL HIGH (ref 0.6–1.0)
Glucose: 216 mg/dL — ABNORMAL HIGH (ref 70–100)
Potassium: 4.9 (ref 3.5–5.1)
Sodium: 135 — ABNORMAL LOW (ref 136–145)

## 2013-08-17 LAB — POCT GLUCOSE
Whole Blood Glucose POCT: 72 mg/dL (ref 70–100)
Whole Blood Glucose POCT: 215 mg/dL — AB (ref 70–100)
Whole Blood Glucose POCT: 189 mg/dL — AB (ref 70–100)
Whole Blood Glucose POCT: 253 mg/dL — AB (ref 70–100)

## 2013-08-17 LAB — PT AND APTT
PT INR: 1.8 — ABNORMAL HIGH (ref 0.9–1.1)
PT: 20.5 — ABNORMAL HIGH (ref 12.6–15.0)
PTT: 71 — ABNORMAL HIGH (ref 23–37)

## 2013-08-17 LAB — APTT: PTT: 63 — ABNORMAL HIGH (ref 23–37)

## 2013-08-17 LAB — GFR: EGFR: 15.7

## 2013-08-17 NOTE — Plan of Care (Signed)
Problem: Potential for Compromised Hemodynamic Status  Goal: Stable vital signs and fluid balance  Outcome: Progressing  Patient is alert, lungs diminished breath sound bilateral bases, oxygen sat 93% at room air, not in acute distress. Continue to monitor.

## 2013-08-17 NOTE — Progress Notes (Signed)
Comfortable last night. Pt attempted to void but was unable to. However she had dialysis yesterday and next is due Thursday.

## 2013-08-17 NOTE — Plan of Care (Signed)
Problem: Safety  Goal: Patient will be free from injury during hospitalization  Outcome: Progressing  Pt has consistently called for oob assistance waits for staff to help. Call light and phone within reach. Continue with poc.  Intervention: Assess patient's risk for falls and implement fall prevention plan of care per policy  Medicated with tramadol for pain with good pain relief. Continue to reasses and medicate for pain as needed.

## 2013-08-17 NOTE — Progress Notes (Addendum)
[facility] Progress Note      Date Time: 08/17/2013 1:37 PM      Pulmonary Attending Addendum:     Comfortable  Asymptomatic    Lungs-few craclkes  Cor-RR    Imp/Plan as per Ms Duggan    24 hr Events/Subjective:   All chest pain is gone - sats 96% on RA now - breathing much better  Pleural fluid - no organisms, afebrile  Using IS - only 750cc  INR 1.8 - still on heparin awaiting coumadin therapeutic INR    Pleural fluid cytology pending  Assessment:     1. L > R pleural effusion with pleuritic chest pain - left thoracentesis 8/25 for 1100 exudative serosang fluid  2. Hypoxia related to effusions & hypoventilation  3. Recent viral pericarditis  4. Diastolic dysfunction  5. ESRD on HD  6. Hyponatremia  7. LLE DVT - on coumadin: [place] 05/2013 with subtherapeutic INR on presentation  8. DM  9. HTN  10. Anemia    Plan:   1. Leave off oxygen - goal sats 92%  2. F/u CXR in am  3. HD tomorrow  4. Await cytology & final cx on pleural fluid  5. DVT prophylaxis = on heparin/coumadin (DVT 05/2013)  6. Code status = full  7. When discharged follow up in office with Dr Nechama Guard     Medications:      Scheduled Meds: PRN Meds:           amLODIPine 2.5 mg Oral Daily   aspirin 81 mg Oral Daily   atorvastatin 10 mg Oral Daily   b complex-vitamin c-folic acid 1 tablet Oral Daily   bisacodyl 5 mg Oral Daily   colchicine 0.6 mg Oral Daily   gabapentin 300 mg Oral TID   insulin glargine 12 Units Subcutaneous QHS   lidocaine 1 patch Transdermal Q24H   pantoprazole 40 mg Oral QAM AC   pregabalin 75 mg Oral Q12H [facility]   warfarin 7.5 mg Oral Daily at 1800       Continuous Infusions:       . heparin 25000 units in dextrose 5% 500 mL 1,000 Units/hr (08/17/13 0041)         acetaminophen 650 mg Q6H PRN   alum & mag hydroxide-simethicone 30 mL Q4H PRN   dextrose 15 g PRN   dextrose 25 mL PRN   glucagon (rDNA) 1 mg PRN   heparin (porcine) 3,000 Units PRN   insulin regular 1-8 Units TID AC PRN   lidocaine  PRN   lidocaine  PRN   oxyCODONE-acetaminophen 2 tablet  Q6H PRN   simethicone 80 mg Q6H PRN   [EXPIRED] sodium chloride 100 mL Q1H PRN   [EXPIRED] sodium chloride 250 mL PRN   traMADol 50 mg Q6H PRN           Physical Exam:     Filed Vitals:    08/17/13 0441 08/17/13 0822 08/17/13 0943 08/17/13 1149   BP: 122/57 118/70  117/58   Pulse: 91 110  90   Temp: 97.2 F (36.2 C) 97.3 F (36.3 C)  98.1 F (36.7 C)   TempSrc: Oral      Resp: 18 20  20    Height:       Weight:       SpO2: 98% 90% 98% 96%     Temp (24hrs), Avg:98.2 F (36.8 C), Min:97.2 F (36.2 C), Max:99.5 F (37.5 C)    General Appearance:  fatigued, no acute distress  Lungs:    diminished  Left base, bilateral basilar crackles  Heart:    regular rhythm,  no murmur   Abdomen:   soft, non-tender  Extremities:   1+ RLE >LLE  edema  Neurologic:  Alert and oriented, moves all extremities equally      Intake/Output Summary (Last 24 hours) at 08/17/13 1337  Last data filed at 08/17/13 0600   Gross per 24 hour   Intake    296 ml   Output      0 ml   Net    296 ml       Active PICC Line / CVC Line / PIV Line / Drain / Airway / Intraosseous Line / Epidural Line / ART Line / Line / Wound / Pressure Ulcer / NG/OG Tube     Name   Placement date   Placement time   Site   Days    Peripheral IV 08/07/13 Right Other (Comment)  08/07/13      Other (Comment)   2    Graft/Fistula Left          --          Labs:       Lab 08/17/13 0900 08/16/13 1746 08/16/13 0210 08/15/13 0925 08/14/13 0526 08/13/13 0954 08/11/13 0834   GLU 216* 228* 241* 118* 200* 139* 133*        Lab 08/17/13 0900 08/16/13 1746 08/16/13 0210   WBC 8.25 7.69 7.68   RBC 2.96* 3.02* 2.69*   HGB 8.4* 8.6* 7.8*   HCT 25.5* 26.2* 23.2*   MCV 86.1 86.8 86.2   MCHC 32.9 32.8 33.6   RDW 14 14 14    MPV 11.3 11.2 10.9   PLT 287 257 273       Lab 08/16/13 1746 08/15/13 0925 08/13/13 0954   ALT -- 21 35   AST -- 15 28   GLOB -- 3.8* 3.9*   ALB 2.2* 2.3* 2.6*       Lab 08/16/13 1746 08/15/13 0925   CK 34 29   TROPI 0.01 <0.01   TROPT -- --   CKMBINDEX -- --       Lab  08/16/13 1746 08/15/13 0925   BILITOTAL -- 0.3   BILIDIRECT -- 0.2   PROT -- 6.1   ALB 2.2* --   ALT -- 21   AST -- 15       Lab 08/17/13 0934   PT 20.5*   INR 1.8*   PTT 71*       Lab 08/17/13 0900 08/16/13 1746 08/16/13 0210 08/15/13 0925 08/13/13 0954   NA 135* 136 130* -- --   K 4.9 4.2 4.4 -- --   CL 99 98 95* -- --   CO2 23 27 24  -- --   BUN 34* 22* 49* -- --   CREAT 3.5* 2.8* 4.7* -- --   CA 8.9 9.0 8.2* -- --   ALB -- 2.2* -- 2.3* 2.6*   PROT -- -- -- 6.1 6.5   BILITOTAL -- -- -- 0.3 0.5   ALKPHOS -- -- -- 72 77   ALT -- -- -- 21 35   AST -- -- -- 15 28   GLU 216* 228* 241* -- --       Microbiology:     Pleural fluid - no orgs    Rads:   Radiological Procedure reviewed    8/25 CXR  1. Interval improvement of previously noted large left  pleural effusion.  No pneumothorax.  2. Persistent bilateral pleural effusions with basilar airspace  opacities    8/25 CXR  Significant interval large enlargement of the left pleural  effusion    8/22 CXR  No interval change in the bilateral pleural effusions and  atelectasis    8/17 CXR   Stable exam with small right pleural effusion and moderate left pleural  effusion.    Signed:  Chyrl Civatte NP  [facility]    Agree with above. Hemodialysis tomorrow. Discussed with family.

## 2013-08-17 NOTE — Progress Notes (Signed)
Mary [name] is a 67 y.o. female patient with PMH notable for  DM, HTN, ESRD on hemodialysis, admitted because of SOB.    Active Problems:   Chest pain    Past Medical History   Diagnosis Date   . Diabetes mellitus without complication    . Hyperlipidemia    . Hypertensive disorder    . Chronic kidney disease      dialysis t-th-sat   . Arthritis    . Cataracts, bilateral    . GERD (gastroesophageal reflux disease)    . Acute deep vein thrombosis (DVT) of distal vein of left lower extremity    . Hemodialysis access site with mature fistula      Current Facility-Administered Medications   Medication Dose Route Frequency Provider Last Rate Last Dose   . acetaminophen (TYLENOL) tablet 650 mg  650 mg Oral Q6H PRN Laroy Apple, MD       . alum & mag hydroxide-simethicone (MAALOX PLUS) 200-200-20 mg/5 mL suspension 30 mL  30 mL Oral Q4H PRN Noralyn Pick, MD       . amLODIPine [facility]) tablet 2.5 mg  2.5 mg Oral Daily Noralyn Pick, MD   2.5 mg at 08/17/13 1112   . aspirin chewable tablet 81 mg  81 mg Oral Daily Laroy Apple, MD   81 mg at 08/17/13 1112   . atorvastatin (LIPITOR) tablet 10 mg  10 mg Oral Daily Laroy Apple, MD   10 mg at 08/16/13 2215   . b complex-vitamin c-folic acid (NEPHRO-VITE) tablet 1 tablet  1 tablet Oral Daily Laroy Apple, MD   1 tablet at 08/17/13 1112   . bisacodyl (DULCOLAX) EC tablet 5 mg  5 mg Oral Daily Noralyn Pick, MD   5 mg at 08/17/13 1112   . colchicine tablet 0.6 mg  0.6 mg Oral Daily Laroy Apple, MD   0.6 mg at 08/17/13 1112   . dextrose (GLUCOSE) 40 % oral gel 15 g  15 g Oral PRN Laroy Apple, MD       . dextrose 50 % bolus 25 mL  25 mL Intravenous PRN Laroy Apple, MD       . gabapentin (NEURONTIN) capsule 300 mg  300 mg Oral TID Laroy Apple, MD   300 mg at 08/17/13 1117   . glucagon (rDNA) (GLUCAGEN) injection 1 mg  1 mg Intramuscular PRN Laroy Apple, MD       . heparin (porcine) injection 3,000 Units  3,000 Units Intravenous PRN Noralyn Pick, MD   3,000 Units at 08/15/13 2012   . heparin 25000 units in dextrose 5% 500 mL infusion (premix)  1,000 Units/hr Intravenous Continuous Noralyn Pick, MD 20 mL/hr at 08/17/13 0041 1,000 Units/hr at 08/17/13 0041   . insulin glargine (LANTUS) injection 12 Units  12 Units Subcutaneous QHS Laroy Apple, MD   12 Units at 08/16/13 2225   . insulin regular (HumuLIN R,NovoLIN R) injection 1-8 Units  1-8 Units Subcutaneous TID AC PRN Laroy Apple, MD   3 Units at 08/17/13 1301   . lidocaine (LIDODERM) 5 % 1 patch  1 patch Transdermal Q24H Quintin Alto, Kittie Plater A, MD   1 patch at 08/17/13 1626   . lidocaine (LMX PLUS 4%) 4 % dressing   Topical PRN Alric Quan, MD       . lidocaine (LMX PLUS 4%) 4 % dressing   Topical PRN  Alric Quan, MD   1 each at 08/16/13 [phone]   . oxyCODONE-acetaminophen (PERCOCET) 5-325 MG per tablet 2 tablet  2 tablet Oral Q6H PRN Noralyn Pick, MD   2 tablet at 08/15/13 2233   . pantoprazole (PROTONIX) EC tablet 40 mg  40 mg Oral QAM AC Laroy Apple, MD   40 mg at 08/17/13 1112   . pregabalin (LYRICA) capsule 75 mg  75 mg Oral Q12H [place] Laroy Apple, MD   75 mg at 08/17/13 1112   . simethicone (MYLICON) chewable tablet 80 mg  80 mg Oral Q6H PRN Noralyn Pick, MD   80 mg at 08/13/13 1452   . traMADol (ULTRAM) tablet 50 mg  50 mg Oral Q6H PRN Laroy Apple, MD   50 mg at 08/16/13 2225   . warfarin (COUMADIN) tablet 7.5 mg  7.5 mg Oral Daily at 1800 Noralyn Pick, MD   7.5 mg at 08/16/13 [id number]     Allergies   Allergen Reactions   . Penicillins Hives   . Shrimp (Shellfish Allergy) Swelling     Blood pressure 101/68, pulse 111, temperature 97.3 F (36.3 C), temperature source Oral, resp. rate 18, height 1.626 m (5\' 4" ), weight 92.987 kg (205 lb), SpO2  93.00%.    Subjective:  Symptoms:  She reports shortness of breath.    Diet:  Adequate intake.    Activity level: Impaired due to weakness.      Objective:  General Appearance:  In no acute distress.    Vital signs: (most recent): Blood pressure 101/68, pulse 111, temperature 97.3 F (36.3 C), temperature source Oral, resp. rate 18, height 1.626 m (5\' 4" ), weight 92.987 kg (205 lb), SpO2 93.00%.    Output: No urine output and producing stool.    Lungs:  There are decreased breath sounds.    Heart: Normal rate.    Neurological: Patient is alert.    Skin:  Warm.    Abdomen: Abdomen is soft.      Results     Procedure Component Value Units Date/Time    Body fluid culture [id number] Collected:08/15/13 1630    Specimen Information:Body Fluid / Pleural Fluid Updated:08/17/13 1643    Narrative:    To be collected in IR  ORDER#: [id number]                                    ORDERED BY: Porfirio Oar  SOURCE: Pleural Fluid pleural fluid                  COLLECTED:  08/15/13 16:30  ANTIBIOTICS AT COLL.:                                RECEIVED :  08/15/13 19:29  Stain, Gram                                FINAL       08/15/13 20:25  08/15/13   Rare WBCs             Rare Epithelial cells             No organisms seen  Culture Body Fluid  PRELIM      08/17/13 16:43  08/16/13   Culture no growth to date. Final report to follow  08/17/13   Culture no growth to date. Final report to follow      POCT glucose (AC and HS) [id number]  (Abnormal) Collected:08/17/13 1641     POCT Glucose WB 189 (A) mg/dL Updated:08/17/13 1641    POCT glucose (AC and HS) [id number]  (Abnormal) Collected:08/17/13 1149     POCT Glucose WB 215 (A) mg/dL Updated:08/17/13 1149    PT/APTT [id number]  (Abnormal) Collected:08/17/13 0934     PT 20.5 (H) Updated:08/17/13 1001     PT INR 1.8 (H)      PT Anticoag. Given Within 48 hrs. Unknown      PTT 71 (H)     Basic Metabolic Panel [id number]  (Abnormal) Collected:08/17/13 0900     Specimen Information:Blood Updated:08/17/13 0937     Glucose 216 (H) mg/dL      BUN 34 (H) mg/dL      Creatinine 3.5 (H) mg/dL      CALCIUM 8.9 mg/dL      Sodium 135 (L)      Potassium 4.9      Chloride 99      CO2 23      Anion Gap 13.0     GFR [id number] Collected:08/17/13 0900     EGFR 15.7 Updated:08/17/13 0937    CBC and differential [id number]  (Abnormal) Collected:08/17/13 0900    Specimen Information:Blood / Blood Updated:08/17/13 0915     WBC 8.25      RBC 2.96 (L)      Hgb 8.4 (L) g/dL      Hematocrit 25.5 (L) %      MCV 86.1 fL      MCH 28.4 pg      MCHC 32.9 g/dL      RDW 14 %      Platelets 287      MPV 11.3 fL      Neutrophils 51 %      Lymphocytes Automated 33 %      Monocytes 12 %      Eosinophils Automated 3 %      Basophils Automated 0 %      Immature Granulocyte 1 %      Nucleated RBC 0      Neutrophils Absolute 4.19      Abs Lymph Automated 2.74      Abs Mono Automated 1.03      Abs Eos Automated 0.25      Absolute Baso Automated 0.04      Absolute Immature Granulocyte 0.07 (H)     POCT glucose (AC and HS) [id number] Collected:08/17/13 0614     POCT Glucose WB 72 mg/dL Updated:08/17/13 0615    POCT glucose (AC and HS) [id number]  (Abnormal) Collected:08/16/13 2217     POCT Glucose WB 276 (A) mg/dL Updated:08/16/13 2217    APTT [id number]  (Abnormal) Collected:08/16/13 2032     PTT 57 (H) Updated:08/16/13 2116     APTT Anticoag. Given w/i 48 hrs. heparin & war     Narrative:    Repeat appt stat please    Body Fluid pH [id number] Collected:08/15/13 1630     pH, Body Fluid 7.9 Updated:08/16/13 2003     Fluid Body fluid     Narrative:    To be collected in IR    APTT [id number] Collected:08/16/13 1746     PTT 30 Updated:08/16/13 1923  APTT Anticoag. Given w/i 48 hrs. heparin & war     Creatine Kinase (CK) [id number] Collected:08/16/13 1746    Specimen Information:Blood Updated:08/16/13 1855     Creatine Kinase (CK) 34 U/L     Renal function panel [id number]  (Abnormal) Collected:08/16/13  1746     Glucose 228 (H) mg/dL Updated:08/16/13 1855     Sodium 136      Potassium 4.2      Chloride 98      CO2 27      BUN 22 (H) mg/dL      CALCIUM 9.0 mg/dL      Creatinine 2.8 (H) mg/dL      Albumin 2.2 (L) g/dL      Phosphorus 3.8 mg/dL      Anion Gap 11.0     GFR [id number] Collected:08/16/13 1746     EGFR 20.3 Updated:08/16/13 1855    Troponin I [id number] Collected:08/16/13 1746     Troponin I 0.01 ng/mL Updated:08/16/13 1853    CBC and differential (pre dialysis - first dialysis of the week) [id number]  (Abnormal) Collected:08/16/13 1746    Specimen Information:Blood / Blood Updated:08/16/13 1821     WBC 7.69      RBC 3.02 (L)      Hgb 8.6 (L) g/dL      Hematocrit 26.2 (L) %      MCV 86.8 fL      MCH 28.5 pg      MCHC 32.8 g/dL      RDW 14 %      Platelets 257      MPV 11.2 fL      Neutrophils 64 %      Lymphocytes Automated 24 %      Monocytes 10 %      Eosinophils Automated 2 %      Basophils Automated 0 %      Immature Granulocyte 0 %      Nucleated RBC 0      Neutrophils Absolute 4.89      Abs Lymph Automated 1.81      Abs Mono Automated 0.79      Abs Eos Automated 0.16      Absolute Baso Automated 0.04      Absolute Immature Granulocyte 0.03     Narrative:    For 6 Hours      Echocardiogram Adult Complete W Clr/ Dopp Waveform    08/01/2013  impression: 1. Atrial septal aneurysm is present. 2. There is a small to moderate pericardial effusion noted both anteriorly and posteriorly.  this is only slightly increased from the previous echocardiogram dated 07/20/2013. Variation of tricuspid flow velocity is noted. There is no diastolic collapse of the right ventricle noted. There is organized material note noted in the apex of the right ventricle not previously reported. Small small pleural effusion is present. Clinical correlation is required considering these echocardiographic changes. 3. There are no other significant echogram echocardiographic changes noted.  Lennon Alstrom, MD  08/01/2013 2:03 PM      Echocardiogram Adult Complete W Color Doppler Waveform    07/20/2013   Normal left ventricular systolic function. No significant valvular heart disease. Small pericardial effusion without evidence of cardiac tamponade.    Audria Nine, MD  07/20/2013 4:09 PM     Ct Abdomen Pelvis Wo Iv/ Wo Po Cont    07/20/2013   1. Interval development of a minimal to moderate pericardial effusion and trace pleural effusions since prior exam one  day earlier. 2. No detectable acute abnormalities in the abdomen or pelvis. 3. Contrast residue gallbladder, kidneys, and bladder. 4. Numerous small low-attenuation kidney lesions. 5. Remainder as above. 6. Findings discussed with Dr. Beryle Lathe by Dr. Oren Binet at 3:59 AM.  Sheria Lang, MD  07/20/2013 8:23 AM     Xr Chest 2 Views    08/12/2013   1. No interval change in the bilateral pleural effusions and atelectasis.  Dennison Mascot, MD  08/12/2013 8:52 AM     Xr Chest 2 Views    08/10/2013   Small pleural effusions and basilar atelectasis, decreased from prior.  Loel Ro, MD  08/10/2013 10:22 AM     Ct Angio Chest    07/19/2013   1. No CT scan evidence for detectable pulmonary embolism. 2. Minimal cardiomegaly. 3. Minimal bibasilar lung atelectasis. 4. Remainder as above.  Sheria Lang, MD  07/19/2013 3:18 PM     Xr Chest Ap Portable    08/15/2013    1. Interval improvement of previously noted large left pleural effusion. No pneumothorax. 2. Persistent bilateral pleural effusions with basilar airspace opacities.  Darin Engels, MD  08/15/2013 5:10 PM     Xr Chest Ap Portable    08/15/2013   Significant interval large enlargement of the left pleural effusion.  Results given via telephone to Dr. Daleen Snook on 08/15/2013 at 11:31 AM  Pleas Koch, MD  08/15/2013 11:32 AM     Xr Chest Ap Portable    08/07/2013   Stable exam with small right pleural effusion and moderate left pleural effusion.  Loel Lofty. Anna Genre, MD  08/07/2013 9:38 AM     Chest Ap Portable    07/30/2013    1. New bilateral pleural effusions  with bibasilar airspace opacities. 2. Stable cardiomegaly.  Darin Engels, MD  07/30/2013 4:33 PM     Xr Chest Ap Portable    07/19/2013   Hypoinflation with minimal basilar atelectasis.  Sheria Lang, MD  07/19/2013 2:45 PM     Thoracentesis    08/15/2013   Successful thoracentesis. 1100 mL of serosanguineous pleural fluid was acquired.  Kathaleen Grinder, MD  08/15/2013 4:49 PM     Assessment:  (1.chest pain  2.Left pleural effusion  3.viral pericarditis  4.ESRD  5.DM  6.MGUS  7.HTN  8.Left LE DVT  9.anemia).       Plan:   (1.S/P left thoracentesis  2.Hemodialysis with UF scheduled for tomorrow  3.monitor serum electrolytes  4.on IV heparin and coumadin anticoagulation  ).         Alric Quan  08/17/2013

## 2013-08-17 NOTE — Progress Notes (Signed)
Paged Dr. Trinidad Curet to report ptt result of 22. MD asked for a repeat lab draw to confirm result. Will recheck and follow protocol for heparin therapy.

## 2013-08-17 NOTE — Plan of Care (Signed)
Problem: Safety  Goal: Patient will be free from injury during hospitalization  Outcome: Progressing  Patient is alert, oriented x4, fall safety maintained, hourly rounding done. Ambulating in the hallway with the clinical tech with steady gait. Denies dizziness or lightheadedness. Instructed to always call for assistance and verbalized understanding, call light within reach. Continue to monitor.

## 2013-08-17 NOTE — Progress Notes (Signed)
APTT 71. Heparin drip maintained at 600units/hr as per protocol low intensity.   APTT at 2130

## 2013-08-17 NOTE — Progress Notes (Signed)
[facility]  HOSPITALIST  PROGRESS NOTE      Patient: Mary [name]  Date: 08/17/2013   LOS: 10 Days  Admission Date: 08/07/2013   MRN: [id number]  Attending: Kelli Churn MD     ASSESSMENT/PLAN   Mary [name] is a 67 y.o. female with hx of recent viral pericarditis, diastolic congestive heart failure, small to moderate pericardial effusion and bilat pleural effusions, ESRD on HD, DM, HTN, HLP, LLE DVT and diabetic nephropathy,admitted with chest pain and SOB.     1. Chest pain - multifactorial, atypical. Possibly musculoskeletal because the pain is reproducible. Pt was seen by Cardiology, who thought the pain could also be from recent pericarditis and or fluid overload   Plan: Percocet given for pain today. Also gave pepcid and maalox because pt was complaining of gas pain   - c/w aspirin 81 mg pO daily, atorvastatin, and pantoprazole   - c/w colchicine for recent pericarditis   2. ESRD on HD - pt appears fluid overloaded in the setting of ESRD. pt received HD today,  Plan: - appreciate Renal consult, pt follows with Dr. Gaspar Garbe   - HD per Renal   4. Recent DVT with subtherapeutic INR- pt is on heparin gtt, bridging to coumadin. Cannot receive lovenox because of ESRD. INR 1.8 today  Plan: check INR daily, Coumadin 7.5 mg PO QHS tonight   5. Normocytic anemia - likely anemia of chronic disease in the setting of ESRD, pt on aranesp weekly   6. Bilateral pleural effusions with small to moderate pericardial effusion - seen on TTE on 8/11 - slightly increased from the previous echo from 7/30. Pt has hx of recent pericarditis treated with ibuprofen and now colchicine , CXR shows stability of effusions  Plan: s/p thoracentesis - pleural fluid cx NGSF, cytology pending, fluid consistent with exudate  7. DM - c/w Lantus and insulin sliding scale , continue gabapentin and lyrica for diabetic neuropathy  8. Atrial septal aneurysm seen on TTE   9. Hypertension - BP well controlled on amlodopine to 2.5 mg PO daily  10.  Acute on chronic dyastolic CHF in the setting of ESRD - c/w dialysis for fluid overload management  11. HLD - atorvastatin   12. Hyponatremia     DVT PPX: already on heparin gtt   Diet: cardiac diet, fluid restriction   Code: Full code   Dispo: once INR therapeutic, heparin gtt --> coumadin, likely in the next day or two for discharge    SUBJECTIVE     Pt doing well, sitting upright in bed this am. No complaints. Currently denies chest pain. No shortness or breath or other complaints  MEDICATIONS     Current Facility-Administered Medications   Medication Dose Route Frequency   . amLODIPine  2.5 mg Oral Daily   . aspirin  81 mg Oral Daily   . atorvastatin  10 mg Oral Daily   . b complex-vitamin c-folic acid  1 tablet Oral Daily   . bisacodyl  5 mg Oral Daily   . colchicine  0.6 mg Oral Daily   . gabapentin  300 mg Oral TID   . insulin glargine  12 Units Subcutaneous QHS   . lidocaine  1 patch Transdermal Q24H   . pantoprazole  40 mg Oral QAM AC   . pregabalin  75 mg Oral Q12H [place]   . warfarin  7.5 mg Oral Daily at 1800       ROS     Remainder of  10 point ROS as above or otherwise negative    PHYSICAL EXAM     Filed Vitals:    08/17/13 0943   BP:    Pulse:    Temp:    Resp:    SpO2: 98%       Temperature: Temp  Min: 97.2 F (36.2 C)  Max: 99.5 F (37.5 C)  Pulse: Pulse  Min: 60   Max: 110   Respiratory: Resp  Min: 18   Max: 20   Non-Invasive BP: BP  Min: 116/66  Max: 136/63  Pulse Oximetry SpO2  Min: 90 %  Max: 98 %    Intake and Output Summary (Last 24 hours) at Date Time    Intake/Output Summary (Last 24 hours) at 08/17/13 1003  Last data filed at 08/17/13 0600   Gross per 24 hour   Intake    446 ml   Output   2000 ml   Net  -1554 ml       GEN APPEARANCE: Normal;  A&OX3  HEENT: PERLA; EOMI; Conjunctiva Clear  NECK: Supple; No bruits  CVS: RRR, S1, S2; No M/G/R  LUNGS: CTAB; decreased breath sounds bases of lungs   ABD: Soft; No TTP; + Normoactive BS  EXT: No edema; Pulses 2+ and intact  SKIN: No rash or  Lesions  NEURO: CN 2-12 intact; No Focal neurological deficits      LABS       Lab 08/17/13 0900 08/16/13 1746 08/16/13 0210   WBC 8.25 7.69 7.68   RBC 2.96* 3.02* 2.69*   HGB 8.4* 8.6* 7.8*   HCT 25.5* 26.2* 23.2*   MCV 86.1 86.8 86.2   PLT 287 257 273         Lab 08/17/13 0900 08/16/13 1746 08/16/13 0210 08/15/13 0925 08/14/13 0526   NA 135* 136 130* 129* 129*   K 4.9 4.2 4.4 4.5 4.6   CL 99 98 95* 94* 93*   CO2 23 27 24 24 26    BUN 34* 22* 49* 40* 24*   CREAT 3.5* 2.8* 4.7* 4.5* 3.6*   GLU 216* 228* 241* 118* 200*   CA 8.9 9.0 8.2* 8.7 8.7   MG -- -- -- -- --         Lab 08/16/13 1746 08/15/13 0925 08/13/13 0954   ALT -- 21 35   AST -- 15 28   GGT -- -- --   BILITOTAL -- 0.3 0.5   BILIDIRECT -- 0.2 --   ALB 2.2* 2.3* 2.6*   ALKPHOS -- 72 77         Lab 08/16/13 1746 08/15/13 0925   CK 34 29   CKMB -- --   CKMBINDEX -- --   TROPI 0.01 <0.01         Lab 08/17/13 0934 08/16/13 2032 08/16/13 1746 08/16/13 0210 08/15/13 0925   INR 1.8* -- -- 1.6* 1.5*   PT 20.5* -- -- 18.8* 17.5*   PTT 71* 57* 30 -- --         RADIOLOGY     Radiological Procedure reviewed.    XR CHEST AP PORTABLE    Final Result:      Stable exam with small right pleural effusion and moderate left pleural    effusion.        Loel Lofty. Anna Genre, MD     08/07/2013 9:38 AM   XR CHEST 2 VIEWS    Final Result:  Small pleural effusions and basilar atelectasis,  decreased     from prior.        Loel Ro, MD     08/10/2013 10:22 AM   XR CHEST 2 VIEWS    Final Result:      1. No interval change in the bilateral pleural effusions and    atelectasis.        Dennison Mascot, MD     08/12/2013 8:52 AM   XR CHEST AP PORTABLE    Final Result:  Significant interval large enlargement of the left pleural     effusion.        Results given via telephone to Dr. Daleen Snook on 08/15/2013 at 11:31 AM        Pleas Koch, MD     08/15/2013 11:32 AM   THORACENTESIS    Final Result:      Successful thoracentesis. 1100 mL of serosanguineous pleural fluid was    acquired.        Kathaleen Grinder, MD     08/15/2013 4:49 PM   XR CHEST AP PORTABLE    Final Result:          1. Interval improvement of previously noted large left pleural effusion.    No pneumothorax.    2. Persistent bilateral pleural effusions with basilar airspace    opacities.        Darin Engels, MD     08/15/2013 5:10 PM       Signed,  Kelli Churn MD  10:03 AM 08/17/2013

## 2013-08-17 NOTE — Progress Notes (Signed)
New ptt result 57. No change in heparin therapy at this time. Next draw schedule at 0830am in epic

## 2013-08-18 ENCOUNTER — Other Ambulatory Visit: Payer: Medicare Other

## 2013-08-18 ENCOUNTER — Inpatient Hospital Stay: Payer: Medicare Other

## 2013-08-18 LAB — ECG 12-LEAD
Atrial Rate: 102 {beats}/min
P Axis: 56 degrees
P-R Interval: 162 ms
Q-T Interval: 306 ms
QRS Duration: 82 ms
QTC Calculation (Bezet): 398 ms
R Axis: 0 degrees
T Axis: 28 degrees
Ventricular Rate: 102 {beats}/min

## 2013-08-18 LAB — CBC AND DIFFERENTIAL
Basophils Absolute Automated: 0.04 (ref 0.00–0.20)
Basophils Automated: 0 %
Eosinophils Absolute Automated: 0.29 (ref 0.00–0.70)
Eosinophils Automated: 4 %
Hematocrit: 23.2 % — ABNORMAL LOW (ref 37.0–47.0)
Hgb: 7.7 g/dL — ABNORMAL LOW (ref 12.0–16.0)
Immature Granulocytes Absolute: 0.02
Immature Granulocytes: 0 %
Lymphocytes Absolute Automated: 2.74 (ref 0.50–4.40)
Lymphocytes Automated: 37 %
MCH: 28.8 pg (ref 28.0–32.0)
MCHC: 33.2 g/dL (ref 32.0–36.0)
MCV: 86.9 fL (ref 80.0–100.0)
MPV: 10.8 fL (ref 9.4–12.3)
Monocytes Absolute Automated: 0.79 (ref 0.00–1.20)
Monocytes: 11 %
Neutrophils Absolute: 3.46 (ref 1.80–8.10)
Neutrophils: 47 %
Nucleated RBC: 0 (ref 0–1)
Platelets: 301 (ref 140–400)
RBC: 2.67 — ABNORMAL LOW (ref 4.20–5.40)
RDW: 14 % (ref 12–15)
WBC: 7.32 (ref 3.50–10.80)

## 2013-08-18 LAB — APTT: PTT: 58 — ABNORMAL HIGH (ref 23–37)

## 2013-08-18 LAB — PT/INR
PT INR: 1.9 — ABNORMAL HIGH (ref 0.9–1.1)
PT: 21.6 — ABNORMAL HIGH (ref 12.6–15.0)

## 2013-08-18 LAB — GFR: EGFR: 13.8

## 2013-08-18 LAB — MAGNESIUM: Magnesium: 1.9 mg/dL (ref 1.6–2.6)

## 2013-08-18 LAB — BASIC METABOLIC PANEL
Anion Gap: 11 (ref 5.0–15.0)
BUN: 43 mg/dL — ABNORMAL HIGH (ref 7–19)
CO2: 25 (ref 22–29)
Calcium: 9 mg/dL (ref 8.5–10.5)
Chloride: 101 (ref 98–107)
Creatinine: 3.9 mg/dL — ABNORMAL HIGH (ref 0.6–1.0)
Glucose: 100 mg/dL (ref 70–100)
Potassium: 4.4 (ref 3.5–5.1)
Sodium: 137 (ref 136–145)

## 2013-08-18 LAB — POCT GLUCOSE
Whole Blood Glucose POCT: 140 mg/dL — AB (ref 70–100)
Whole Blood Glucose POCT: 191 mg/dL — AB (ref 70–100)
Whole Blood Glucose POCT: 219 mg/dL — AB (ref 70–100)

## 2013-08-18 LAB — PHOSPHORUS: Phosphorus: 4.9 mg/dL — ABNORMAL HIGH (ref 2.3–4.7)

## 2013-08-18 MED ORDER — SODIUM CHLORIDE 0.9 % IV BOLUS
250.0000 mL | INTRAVENOUS | Status: DC | PRN
Start: 2013-08-18 — End: 2013-08-19

## 2013-08-18 MED ORDER — SODIUM CHLORIDE 0.9 % IV BOLUS
100.0000 mL | INTRAVENOUS | Status: DC | PRN
Start: 2013-08-18 — End: 2013-08-19

## 2013-08-18 NOTE — Progress Notes (Addendum)
[facility]  HOSPITALIST  PROGRESS NOTE      Patient: Mary [name]  Date: 08/18/2013   LOS: 11 Days  Admission Date: 08/07/2013   MRN: [id number]  Attending: Kelli Churn MD     ASSESSMENT/PLAN   Mary [name] is a 67 y.o. female with hx of recent viral pericarditis, diastolic congestive heart failure, small to moderate pericardial effusion and bilat pleural effusions, ESRD on HD, DM, HTN, HLP, LLE DVT and diabetic nephropathy,admitted with chest pain and SOB.     1. Chest pain - multifactorial, atypical. Possibly musculoskeletal because the pain is reproducible. Pt was seen by Cardiology, who thought the pain could also be from recent pericarditis and or fluid overload   Plan: Percocet given for pain today. Also gave pepcid and maalox because pt was complaining of gas pain   - c/w aspirin 81 mg pO daily, atorvastatin, and pantoprazole   - c/w colchicine for recent pericarditis   2. ESRD on HD - pt appears fluid overloaded in the setting of ESRD. pt received HD today,  Plan: - appreciate Renal consult, pt follows with Dr. Gaspar Garbe   - HD per Renal   4. Recent DVT with subtherapeutic INR- pt is on heparin gtt, bridging to coumadin. Cannot receive lovenox because of ESRD. INR 1.9 today !  Plan: check INR daily, Coumadin 7.5 mg PO QHS tonight   5. Normocytic anemia - likely anemia of chronic disease in the setting of ESRD, pt on aranesp weekly   6. Bilateral pleural effusions with small to moderate pericardial effusion - seen on TTE on 8/11 - slightly increased from the previous echo from 7/30. Pt has hx of recent pericarditis treated with ibuprofen and now colchicine , CXR shows stability of effusions  Plan: s/p thoracentesis - pleural fluid cx NGSF, cytology negative for malignancy, fluid consistent with exudate  7. DM - c/w Lantus and insulin sliding scale , continue gabapentin and lyrica for diabetic neuropathy  8. Atrial septal aneurysm seen on TTE   9. Hypertension - BP well controlled on amlodopine to 2.5 mg  PO daily  10. Acute on chronic dyastolic CHF in the setting of ESRD - c/w dialysis for fluid overload management  11. HLD - atorvastatin   12. Hyponatremia     DVT PPX: already on heparin gtt   Diet: cardiac diet, fluid restriction   Code: Full code   Dispo: once INR therapeutic, heparin gtt --> coumadin, likely tomorrow am     SUBJECTIVE     Pt doing well, denies chest pain, SOB nausea, vomiting or other complaints.     MEDICATIONS     Current Facility-Administered Medications   Medication Dose Route Frequency   . amLODIPine  2.5 mg Oral Daily   . aspirin  81 mg Oral Daily   . atorvastatin  10 mg Oral Daily   . b complex-vitamin c-folic acid  1 tablet Oral Daily   . bisacodyl  5 mg Oral Daily   . colchicine  0.6 mg Oral Daily   . gabapentin  300 mg Oral TID   . insulin glargine  12 Units Subcutaneous QHS   . lidocaine  1 patch Transdermal Q24H   . pantoprazole  40 mg Oral QAM AC   . pregabalin  75 mg Oral Q12H [place]   . warfarin  7.5 mg Oral Daily at 1800       ROS     Remainder of 10 point ROS as above or otherwise negative  PHYSICAL EXAM     Filed Vitals:    08/18/13 0505   BP: 110/49   Pulse: 88   Temp: 98.9 F (37.2 C)   Resp: 18   SpO2: 91%       Temperature: Temp  Min: 97.3 F (36.3 C)  Max: 99.3 F (37.4 C)  Pulse: Pulse  Min: 88   Max: 111   Respiratory: Resp  Min: 18   Max: 20   Non-Invasive BP: BP  Min: 101/68  Max: 123/66  Pulse Oximetry SpO2  Min: 90 %  Max: 98 %    Intake and Output Summary (Last 24 hours) at Date Time    Intake/Output Summary (Last 24 hours) at 08/18/13 0847  Last data filed at 08/17/13 1900   Gross per 24 hour   Intake    540 ml   Output      0 ml   Net    540 ml       GEN APPEARANCE: Normal;  A&OX3  HEENT: PERLA; EOMI; Conjunctiva Clear  NECK: Supple; No bruits  CVS: RRR, S1, S2; No M/G/R  LUNGS: CTAB; decreased breath sounds bases of lungs   ABD: Soft; No TTP; + Normoactive BS  EXT: No edema; Pulses 2+ and intact  SKIN: No rash or Lesions  NEURO: CN 2-12 intact; No Focal  neurological deficits      LABS       Lab 08/18/13 0639 08/17/13 0900 08/16/13 1746   WBC 7.32 8.25 7.69   RBC 2.67* 2.96* 3.02*   HGB 7.7* 8.4* 8.6*   HCT 23.2* 25.5* 26.2*   MCV 86.9 86.1 86.8   PLT 301 287 257         Lab 08/18/13 0639 08/17/13 0900 08/16/13 1746 08/16/13 0210 08/15/13 0925   NA 137 135* 136 130* 129*   K 4.4 4.9 4.2 4.4 4.5   CL 101 99 98 95* 94*   CO2 25 23 27 24 24    BUN 43* 34* 22* 49* 40*   CREAT 3.9* 3.5* 2.8* 4.7* 4.5*   GLU 100 216* 228* 241* 118*   CA 9.0 8.9 9.0 8.2* 8.7   MG 1.9 -- -- -- --         Lab 08/16/13 1746 08/15/13 0925 08/13/13 0954   ALT -- 21 35   AST -- 15 28   GGT -- -- --   BILITOTAL -- 0.3 0.5   BILIDIRECT -- 0.2 --   ALB 2.2* 2.3* 2.6*   ALKPHOS -- 72 77         Lab 08/16/13 1746 08/15/13 0925   CK 34 29   CKMB -- --   CKMBINDEX -- --   TROPI 0.01 <0.01         Lab 08/18/13 0639 08/17/13 2116 08/17/13 0934 08/16/13 2032 08/16/13 0210   INR 1.9* -- 1.8* -- 1.6*   PT 21.6* -- 20.5* -- 18.8*   PTT -- 63* 71* 57* --         RADIOLOGY     Radiological Procedure reviewed.    XR CHEST AP PORTABLE    Final Result:      Stable exam with small right pleural effusion and moderate left pleural    effusion.        Loel Lofty. Anna Genre, MD     08/07/2013 9:38 AM   XR CHEST 2 VIEWS    Final Result:  Small pleural effusions and basilar atelectasis, decreased  from prior.        Loel Ro, MD     08/10/2013 10:22 AM   XR CHEST 2 VIEWS    Final Result:      1. No interval change in the bilateral pleural effusions and    atelectasis.        Dennison Mascot, MD     08/12/2013 8:52 AM   XR CHEST AP PORTABLE    Final Result:  Significant interval large enlargement of the left pleural     effusion.        Results given via telephone to Dr. Daleen Snook on 08/15/2013 at 11:31 AM        Pleas Koch, MD     08/15/2013 11:32 AM   THORACENTESIS    Final Result:      Successful thoracentesis. 1100 mL of serosanguineous pleural fluid was    acquired.        Kathaleen Grinder, MD     08/15/2013 4:49 PM   XR CHEST  AP PORTABLE    Final Result:          1. Interval improvement of previously noted large left pleural effusion.    No pneumothorax.    2. Persistent bilateral pleural effusions with basilar airspace    opacities.        Darin Engels, MD     08/15/2013 5:10 PM   XR CHEST 2 VIEWS    (Results Pending)       Signed,  Kelli Churn MD  8:47 AM 08/18/2013

## 2013-08-18 NOTE — Plan of Care (Signed)
Problem: Renal Failure  Goal: Fluid and electrolyte balance are achieved/maintained  Outcome: Progressing  Pt had dialysis today,and 2L was taken off per dialysis rn.  Pt denies any sob.  vss.

## 2013-08-18 NOTE — Progress Notes (Signed)
Mary [name] is a 67 y.o. female patient with PMH notable for  DM, HTN, ESRD on hemodialysis, admitted because of SOB.    Active Problems:   Chest pain    Past Medical History   Diagnosis Date   . Diabetes mellitus without complication    . Hyperlipidemia    . Hypertensive disorder    . Chronic kidney disease      dialysis t-th-sat   . Arthritis    . Cataracts, bilateral    . GERD (gastroesophageal reflux disease)    . Acute deep vein thrombosis (DVT) of distal vein of left lower extremity    . Hemodialysis access site with mature fistula      Current Facility-Administered Medications   Medication Dose Route Frequency Provider Last Rate Last Dose   . acetaminophen (TYLENOL) tablet 650 mg  650 mg Oral Q6H PRN Laroy Apple, MD       . alum & mag hydroxide-simethicone (MAALOX PLUS) 200-200-20 mg/5 mL suspension 30 mL  30 mL Oral Q4H PRN Noralyn Pick, MD       . amLODIPine [facility]) tablet 2.5 mg  2.5 mg Oral Daily Noralyn Pick, MD   2.5 mg at 08/18/13 1458   . aspirin chewable tablet 81 mg  81 mg Oral Daily Laroy Apple, MD   81 mg at 08/18/13 1146   . atorvastatin (LIPITOR) tablet 10 mg  10 mg Oral Daily Laroy Apple, MD   10 mg at 08/17/13 2242   . b complex-vitamin c-folic acid (NEPHRO-VITE) tablet 1 tablet  1 tablet Oral Daily Laroy Apple, MD   1 tablet at 08/18/13 1146   . bisacodyl (DULCOLAX) EC tablet 5 mg  5 mg Oral Daily Noralyn Pick, MD   5 mg at 08/18/13 1147   . colchicine tablet 0.6 mg  0.6 mg Oral Daily Laroy Apple, MD   0.6 mg at 08/18/13 1146   . dextrose (GLUCOSE) 40 % oral gel 15 g  15 g Oral PRN Laroy Apple, MD       . dextrose 50 % bolus 25 mL  25 mL Intravenous PRN Laroy Apple, MD       . gabapentin (NEURONTIN) capsule 300 mg  300 mg Oral TID Laroy Apple, MD   300 mg at 08/18/13 1744   . glucagon (rDNA) (GLUCAGEN) injection 1 mg  1 mg Intramuscular PRN Laroy Apple, MD       . heparin (porcine) injection 3,000 Units  3,000 Units Intravenous PRN Noralyn Pick, MD   3,000 Units at 08/15/13 2012   . heparin 25000 units in dextrose 5% 500 mL infusion (premix)  1,000 Units/hr Intravenous Continuous Noralyn Pick, MD 12 mL/hr at 08/18/13 1507 600 Units/hr at 08/18/13 1507   . insulin glargine (LANTUS) injection 12 Units  12 Units Subcutaneous QHS Laroy Apple, MD   12 Units at 08/17/13 2243   . insulin regular (HumuLIN R,NovoLIN R) injection 1-8 Units  1-8 Units Subcutaneous TID AC PRN Laroy Apple, MD   1 Units at 08/18/13 [id number]   . lidocaine (LIDODERM) 5 % 1 patch  1 patch Transdermal Q24H Quintin Alto, Orion A, MD   1 patch at 08/18/13 1458   . lidocaine (LMX PLUS 4%) 4 % dressing   Topical PRN Alric Quan, MD       . lidocaine (LMX PLUS 4%) 4 % dressing   Topical PRN  Alric Quan, MD   1 each at 08/18/13 [phone]   . oxyCODONE-acetaminophen (PERCOCET) 5-325 MG per tablet 2 tablet  2 tablet Oral Q6H PRN Noralyn Pick, MD   2 tablet at 08/15/13 2233   . pantoprazole (PROTONIX) EC tablet 40 mg  40 mg Oral QAM AC Laroy Apple, MD   40 mg at 08/18/13 0815   . pregabalin (LYRICA) capsule 75 mg  75 mg Oral Q12H [place] Laroy Apple, MD   75 mg at 08/18/13 1146   . simethicone (MYLICON) chewable tablet 80 mg  80 mg Oral Q6H PRN Noralyn Pick, MD   80 mg at 08/13/13 1452   . [EXPIRED] sodium chloride 0.9 % bolus 100 mL  100 mL Other Q1H PRN Alric Quan, MD       . [EXPIRED] sodium chloride 0.9 % bolus 250 mL  250 mL Intravenous PRN Alric Quan, MD       . traMADol Veatrice [place]) tablet 50 mg  50 mg Oral Q6H PRN Laroy Apple, MD   50 mg at 08/16/13 2225   . warfarin (COUMADIN) tablet 7.5 mg  7.5 mg Oral Daily at 1800 Noralyn Pick, MD   7.5 mg at 08/18/13 1744     Allergies   Allergen Reactions   . Penicillins Hives   . Shrimp (Shellfish  Allergy) Swelling     Blood pressure 119/64, pulse 104, temperature 97.9 F (36.6 C), temperature source Oral, resp. rate 16, height 1.626 m (5\' 4" ), weight 92.987 kg (205 lb), SpO2 95.00%.    Subjective:  Symptoms:  She reports shortness of breath.    Diet:  Adequate intake.    Activity level: Impaired due to weakness.      Objective:  General Appearance:  In no acute distress.    Vital signs: (most recent): Blood pressure 119/64, pulse 104, temperature 97.9 F (36.6 C), temperature source Oral, resp. rate 16, height 1.626 m (5\' 4" ), weight 92.987 kg (205 lb), SpO2 95.00%.    Output: No urine output and producing stool.    Lungs:  There are decreased breath sounds.    Heart: Normal rate.    Neurological: Patient is alert.    Skin:  Warm.    Abdomen: Abdomen is soft.      Results     Procedure Component Value Units Date/Time    POCT glucose (AC and HS) [id number]  (Abnormal) Collected:08/18/13 1714     POCT Glucose WB 191 (A) mg/dL Updated:08/18/13 1716    Body fluid culture [id number] Collected:08/15/13 1630    Specimen Information:Body Fluid / Pleural Fluid Updated:08/18/13 1434    Narrative:    To be collected in IR  ORDER#: [id number]                                    ORDERED BY: Porfirio Oar  SOURCE: Pleural Fluid pleural fluid                  COLLECTED:  08/15/13 16:30  ANTIBIOTICS AT COLL.:                                RECEIVED :  08/15/13 19:29  Stain, Gram  FINAL       08/15/13 20:25  08/15/13   Rare WBCs             Rare Epithelial cells             No organisms seen  Culture Body Fluid                         FINAL       08/18/13 14:34  08/18/13   No growth      APTT [id number]  (Abnormal) Collected:08/18/13 1109     PTT 58 (H) Updated:08/18/13 1138     APTT Anticoag. Given w/i 48 hrs. heparin     POCT glucose (AC and HS) [id number]  (Abnormal) Collected:08/18/13 1134     POCT Glucose WB 140 (A) mg/dL Updated:08/18/13 1135    Anaerobic culture [id number]  Collected:08/15/13 1630    Specimen Information:Body Fluid / Pleural Fluid Updated:08/18/13 1133    Narrative:    To be collected in IR  ORDER#: [id number]                                    ORDERED BY: Porfirio Oar  SOURCE: Pleural Fluid pleural fluid                  COLLECTED:  08/15/13 16:30  ANTIBIOTICS AT COLL.:                                RECEIVED :  08/15/13 19:29  Stain, Gram was cancelled on 08/15/13 at 11:39 by HIS; RBS rule  Culture Anaerobic                          FINAL       08/18/13 11:33  08/18/13   No anaerobic growth      Basic Metabolic Panel [id number]  (Abnormal) Collected:08/18/13 0639    Specimen Information:Blood Updated:08/18/13 0748     Glucose 100 mg/dL      BUN 43 (H) mg/dL      Creatinine 3.9 (H) mg/dL      CALCIUM 9.0 mg/dL      Sodium 137      Potassium 4.4      Chloride 101      CO2 25      Anion Gap 11.0     Magnesium [id number] Collected:08/18/13 0639    Specimen Information:Blood Updated:08/18/13 0748     Magnesium 1.9 mg/dL     Phosphorus [id number]  (Abnormal) Collected:08/18/13 0639    Specimen Information:Blood Updated:08/18/13 0748     Phosphorus 4.9 (H) mg/dL     GFR [id number] Collected:08/18/13 0639     EGFR 13.8 Updated:08/18/13 0748    Prothrombin time/INR [id number]  (Abnormal) Collected:08/18/13 0639    Specimen Information:Blood Updated:08/18/13 0738     PT 21.6 (H)      PT INR 1.9 (H)      PT Anticoag. Given Within 48 hrs. heparin     CBC and differential [id number]  (Abnormal) Collected:08/18/13 0639    Specimen Information:Blood / Blood Updated:08/18/13 0711     WBC 7.32      RBC 2.67 (L)      Hgb 7.7 (L) g/dL      Hematocrit 23.2 (L) %      MCV 86.9  fL      MCH 28.8 pg      MCHC 33.2 g/dL      RDW 14 %      Platelets 301      MPV 10.8 fL      Neutrophils 47 %      Lymphocytes Automated 37 %      Monocytes 11 %      Eosinophils Automated 4 %      Basophils Automated 0 %      Immature Granulocyte 0 %      Nucleated RBC 0      Neutrophils Absolute 3.46       Abs Lymph Automated 2.74      Abs Mono Automated 0.79      Abs Eos Automated 0.29      Absolute Baso Automated 0.04      Absolute Immature Granulocyte 0.02     POCT glucose (AC and HS) [id number]  (Abnormal) Collected:08/17/13 2208     POCT Glucose WB 253 (A) mg/dL Updated:08/17/13 2209    APTT [id number]  (Abnormal) Collected:08/17/13 2116     PTT 63 (H) Updated:08/17/13 2157     APTT Anticoag. Given w/i 48 hrs. heparin & war       Echocardiogram Adult Complete W Clr/ Dopp Waveform    08/01/2013  impression: 1. Atrial septal aneurysm is present. 2. There is a small to moderate pericardial effusion noted both anteriorly and posteriorly.  this is only slightly increased from the previous echocardiogram dated 07/20/2013. Variation of tricuspid flow velocity is noted. There is no diastolic collapse of the right ventricle noted. There is organized material note noted in the apex of the right ventricle not previously reported. Small small pleural effusion is present. Clinical correlation is required considering these echocardiographic changes. 3. There are no other significant echogram echocardiographic changes noted.  Lennon Alstrom, MD  08/01/2013 2:03 PM     Echocardiogram Adult Complete W Color Doppler Waveform    07/20/2013   Normal left ventricular systolic function. No significant valvular heart disease. Small pericardial effusion without evidence of cardiac tamponade.    Audria Nine, MD  07/20/2013 4:09 PM     Ct Abdomen Pelvis Wo Iv/ Wo Po Cont    07/20/2013   1. Interval development of a minimal to moderate pericardial effusion and trace pleural effusions since prior exam one day earlier. 2. No detectable acute abnormalities in the abdomen or pelvis. 3. Contrast residue gallbladder, kidneys, and bladder. 4. Numerous small low-attenuation kidney lesions. 5. Remainder as above. 6. Findings discussed with Dr. Beryle Lathe by Dr. Oren Binet at 3:59 AM.  Sheria Lang, MD  07/20/2013 8:23 AM     Xr Chest 2  Views    08/12/2013   1. No interval change in the bilateral pleural effusions and atelectasis.  Dennison Mascot, MD  08/12/2013 8:52 AM     Xr Chest 2 Views    08/10/2013   Small pleural effusions and basilar atelectasis, decreased from prior.  Loel Ro, MD  08/10/2013 10:22 AM     Xr Chest Ap Portable    08/15/2013    1. Interval improvement of previously noted large left pleural effusion. No pneumothorax. 2. Persistent bilateral pleural effusions with basilar airspace opacities.  Darin Engels, MD  08/15/2013 5:10 PM     Xr Chest Ap Portable    08/15/2013   Significant interval large enlargement of the left pleural effusion.  Results given via  telephone to Dr. Daleen Snook on 08/15/2013 at 11:31 AM  Pleas Koch, MD  08/15/2013 11:32 AM     Xr Chest Ap Portable    08/07/2013   Stable exam with small right pleural effusion and moderate left pleural effusion.  Loel Lofty. Anna Genre, MD  08/07/2013 9:38 AM     Chest Ap Portable    07/30/2013    1. New bilateral pleural effusions with bibasilar airspace opacities. 2. Stable cardiomegaly.  Darin Engels, MD  07/30/2013 4:33 PM     Thoracentesis    08/15/2013   Successful thoracentesis. 1100 mL of serosanguineous pleural fluid was acquired.  Kathaleen Grinder, MD  08/15/2013 4:49 PM     Assessment:  (1.chest pain  2.Left pleural effusion  3.viral pericarditis  4.ESRD  5.DM  6.MGUS  7.HTN  8.Left LE DVT  9.anemia).       Plan:   (1.S/P left thoracentesis  2.Hemodialysis with UF today  3.monitor serum electrolytes  4.on IV heparin and coumadin anticoagulation  ).         Alric Quan  08/18/2013

## 2013-08-18 NOTE — Progress Notes (Addendum)
Dialysis Treatment Note    Patient:  Mary [name] MRN#:  [id number]  Unit/Room/Bed:  [id number]   Isolation: None       Allergies:   Allergies   Allergen Reactions   . Penicillins Hives   . Shrimp (Shellfish Allergy) Swelling     Code Status: Full Code    Problem List:   Patient Active Problem List    Diagnosis Date Noted   . Dyspnea 07/30/2013   . Chest pain 07/30/2013   . Pleural effusion 07/30/2013   . Viral pericarditis 07/21/2013   . Chest pain at rest 07/19/2013   . Cellulitis of left leg 06/21/2013   . ESRD (end stage renal disease) 06/21/2013   . DM (diabetes mellitus) 06/21/2013   . Hypertension 06/21/2013   . Cellulitis 06/21/2013       Dialysis Order:  Active Orders   Dialysis    Hemodialysis inpatient     Frequency: Once     Start Date/Time: 08/18/13 1002     Number of Occurrences:  1 Occurrences     Order Questions:     . Date of Dialysis 08/18/2013     . K+ (Initial) 2 mEq     . KPP (Per Protocol) Yes     . Ca++ 2.5 mEq     . Bicarb 35 mEq     . Na+ 138 mEq     . Dialyzer F160     . Dialysate Temperature (C) 37     . BFR-As tolerated to a maximum of: 350 mL/min     . DFR 700 mL/min     . Duration of Treatment 3.5 Hours     . Fluid Removal (L) and Dry Weight (Kg) 2L        Dialysis Treatment Type:    Time out/Safety Check: Time Out/Safety Check Completed: Yes (08/18/13 1000)  [facility] Consent: Consent for HD signed for this hospitalization: Yes (08/18/13 1000)  Blood Consent (if applicable): Blood Consent Verified: Not Applicable ([id number])    Dialysis Access:      Graft/Fistula Left (Active)   Fistula/ Graft Assessment Abnormalities WDL 08/18/2013  2:15 PM   Needle Size 16 Gauge 08/18/2013  2:15 PM   Cannulation Sites held Arterial (min) 10 08/18/2013  2:15 PM   Cannulation Sites held Venous (min) 10 08/18/2013  2:15 PM   Hemostasis Achieved Yes 08/18/2013  2:15 PM       General Assessments:     08/18/13 1030   Hemodialysis Comments   Comments HD TX started.  RR is normal and non-labored.  Pt denies CP, SOB, or N/V. Pt  is in no appaarent distress. POC discussed with the Pt. and displays understanding. Pt has diminished lung sounds but no noteable swelling. Pt has Left arm fistula and was cannulated with 2 16 G needles at 1 stick each. Pt has no other issues at this time. UFG of 2L will be attempted.  Lines secure and will continue to monitor closely.       08/18/13 1405   Vitals   Temp 97.5 F (36.4 C)   Heart Rate ! 104    Resp Rate 21    BP 131/70 mmHg   Machine Metrics   Fluid Removal (ml) 2400 ml   Fluid Bolus (ml) 200 ml   Hemodialysis Comments   Comments HD Tx completed. Lines [place] and sites held for 10 minutes per protocol. Pt has no complaints of CP, SOB, N/V.  UFG of  2L was achieved. Report given to primary nurse.              Pain Assessment: Pain Assessment  Charting Type: Assessment (08/18/13 0800)  Pain Scale Used: Numeric Scale (0-10) (08/18/13 0800)    Labs Values:    HBsAg Result:HBsAg (Antigen) Result: Negative (08/18/13 1000)  HBsAg Date Drawn: HBsAg Date Drawn: 08/07/13 (08/18/13 1000)  HBsAg Next Due Date:HBsAg Repeat Draw Due Date: 09/03/13 (08/18/13 1000)    Lab Results   Component Value Date    BUN 43* 08/18/2013    NA 137 08/18/2013    K 4.4 08/18/2013    CREAT 3.9* 08/18/2013    CO2 25 08/18/2013    CA 9.0 08/18/2013    PHOS 4.9* 08/18/2013    GLU 100 08/18/2013    ALB 2.2* 08/16/2013    HGB 7.7* 08/18/2013    HCT 23.2* 08/18/2013    WBC 7.32 08/18/2013    PLT 301 08/18/2013    PTT 58* 08/18/2013    PT 21.6* 08/18/2013    INR 1.9* 08/18/2013         Current Diet Order  Diet consistent carbohydrate and renal 50 GM Protein     RO/Hemodialysis Machine Safety Checks - Before Each Treatment:  RO/Hemodialysis Archivist Number: 2  (08/18/13 1000)  RO #: 2  (08/18/13 1000)  Water Hardness: 0  (08/18/13 1000)  pH: 7.4  (08/18/13 1000)  Pressure Test Verified: Yes (08/18/13 1000)  Alarms Verified: Passed (08/18/13 1000)  Machine Temperature: 98.6 F (37 C) (08/18/13 1000)  Alarms  Verified: Yes (08/18/13 1000)  Hemodialysis Conductivity (Machine): 13.7  (08/18/13 1000)  Hemodialysis Conductivity (Meter): 13.4  (08/18/13 1000)  RO Machine Log Completed: Yes (08/18/13 1000)    Chlorine Testing:  RO/Hemodialysis Machine Safety Checks  Is Total Chlorine less than 0.1 ppm?: Yes (08/18/13 1000)  Orignial Total Chlorine Testing Time: 0900 (08/18/13 1000)  At 4 Hour Total Chlorine Testing Time: [id number] (08/11/13 1419)    Vitals and Weight:  Patient Vitals for the past 4 hrs:   BP Temp Pulse Resp   08/18/13 1446 119/64 mmHg 97.9 F (36.6 C) 104  16    08/18/13 1415 - - 100  -   08/18/13 1405 131/70 mmHg 97.5 F (36.4 C) 104  21    08/18/13 1400 130/69 mmHg - 104  20    08/18/13 1345 92/67 mmHg - 105  20    08/18/13 1330 107/62 mmHg - 110  20    08/18/13 1315 106/60 mmHg - 103  20    08/18/13 1300 124/64 mmHg - 99  20    08/18/13 1245 119/71 mmHg - 101  18    08/18/13 1230 133/72 mmHg - 101  22    08/18/13 1215 136/68 mmHg - 97  18    08/18/13 1200 134/71 mmHg - 98  18    08/18/13 1145 126/69 mmHg - 98  20    08/18/13 1130 133/68 mmHg - 96  18    08/18/13 1115 132/72 mmHg - 96  18    08/18/13 1100 126/70 mmHg - 97  18         Dialysis Weight  Pre-Treatment Weight (Kg): 91.13  (08/18/13 1000)  Scale Type: ICU Bed Scale (08/18/13 1000)  Post-Treatment Weight (Kg): 90.04  (08/18/13 1405)    Treatment:     08/18/13 1030 08/18/13 1045 08/18/13 1100   Graft/Fistula Left   No Placement Date or Time  found.   Present on Admission?: Yes  Orientation: Left  Graft/Fistula Location: Upper Arm   Fistula/ Graft Assessment Abnormalities WDL --  --    Needle Size 16 Gauge --  --    Vitals   Heart Rate 98  96  97    Resp Rate 18  20  18     BP 133/73 mmHg 128/70 mmHg 126/70 mmHg   Journalist, newspaper Yes --  --    Blood Flow Rate (mL/min) 350 mL/min 350 mL/min 350 mL/min   Arterial Pressure (mmHg) -130 mmHg -180 mmHg -190 mmHg   Venous Pressure (mmHg) 110  150  154    Dialysate Flow Rate  (mL/min) 700 mL/min 700 mL/min 700 mL/min   Transmembrane Pressure (mmHg) 90 mmHg 100 mmHg 99 mmHg   Ultrafiltration Rate (mL/Hr) 710 mL/hr 710 mL/hr 710 mL/hr   Fluid Removal (ml) 0 ml 193 ml 410 ml   Fluid Bolus (ml) 200 ml 0 ml 0 ml   Dialysate K (mEq/L) 2 mEq/L --  --    Dialysate CA (mEq/L) 2.5 mEq/L --  --    Hemodialysis Comments   Arteriovenous Lines Secure Yes Yes Yes   Comments HD TX started.  RR is normal and non-labored. Pt denies CP, SOB, or N/V. Pt  is in no appaarent distress. POC discussed with the Pt. and displays understanding. Pt has diminished lung sounds but no noteable swelling. Pt has Left arm fistula and was cannulated with 2 16 G needles at 1 stick each. Pt has no other issues at this time. UFG of 2L will be attempted.  Lines secure and will continue to monitor closely.  --  --      This note also relates to the following rows which could not be included:  POCT Glucose Result (Read Only) - Cannot attach notes to extension rows       08/18/13 1115 08/18/13 1130 08/18/13 1145   Vitals   Temp --  --  --    Heart Rate 96  96  98    Resp Rate 18  18  20     BP 132/72 mmHg 133/68 mmHg 126/69 mmHg   Machine Metrics   Blood Flow Rate (mL/min) 350 mL/min 350 mL/min 350 mL/min   Arterial Pressure (mmHg) -190 mmHg -236 mmHg -203 mmHg   Venous Pressure (mmHg) 157  148  155    Dialysate Flow Rate (mL/min) 700 mL/min 700 mL/min 700 mL/min   Transmembrane Pressure (mmHg) 98 mmHg 98 mmHg 99 mmHg   Ultrafiltration Rate (mL/Hr) 710 mL/hr 710 mL/hr 710 mL/hr   Fluid Removal (ml) 597 ml 776 ml 911 ml   Fluid Bolus (ml) 0 ml 0 ml 0 ml   Hemodialysis Comments   Arteriovenous Lines Secure Yes Yes Yes   Comments --  --  --        08/18/13 1200 08/18/13 1215 08/18/13 1230   Vitals   Temp --  --  --    Heart Rate 98  97  ! 101    Resp Rate 18  18  22     BP 134/71 mmHg 136/68 mmHg 133/72 mmHg   Machine Metrics   Blood Flow Rate (mL/min) 350 mL/min 350 mL/min 350 mL/min   Arterial Pressure (mmHg) -198 mmHg -203 mmHg  -204 mmHg   Venous Pressure (mmHg) 159  164  160    Dialysate Flow Rate (mL/min) 700 mL/min 700 mL/min 700 mL/min   Transmembrane  Pressure (mmHg) 99 mmHg 100 mmHg 100 mmHg   Ultrafiltration Rate (mL/Hr) 710 mL/hr 710 mL/hr 710 mL/hr   Fluid Removal (ml) 1065 ml 1242 ml 1421 ml   Fluid Bolus (ml) 0 ml 0 ml 0 ml   Hemodialysis Comments   Arteriovenous Lines Secure Yes Yes Yes   Comments --  --  --        08/18/13 1245 08/18/13 1300 08/18/13 1315   Vitals   Temp --  --  --    Heart Rate ! 101  99  ! 103    Resp Rate 18  20  20     BP 119/71 mmHg 124/64 mmHg 106/60 mmHg   Machine Metrics   Blood Flow Rate (mL/min) 350 mL/min 350 mL/min 350 mL/min   Arterial Pressure (mmHg) -206 mmHg -214 mmHg -244 mmHg   Venous Pressure (mmHg) 160  157  158    Dialysate Flow Rate (mL/min) 700 mL/min 700 mL/min 700 mL/min   Transmembrane Pressure (mmHg) 98 mmHg 98 mmHg 99 mmHg   Ultrafiltration Rate (mL/Hr) 710 mL/hr 710 mL/hr 720 mL/hr   Fluid Removal (ml) 1590 ml 1773 ml 1940 ml   Fluid Bolus (ml) 0 ml 0 ml 0 ml   Hemodialysis Comments   Arteriovenous Lines Secure Yes Yes Yes   Comments Pt is rsting comfortably.  --  --        08/18/13 1330 08/18/13 1345 08/18/13 1400   Vitals   Temp --  --  --    Heart Rate ! 110  ! 105  ! 104    Resp Rate 20  20  20     BP 107/62 mmHg 92/67 mmHg 130/69 mmHg   Machine Metrics   Blood Flow Rate (mL/min) 350 mL/min 350 mL/min 350 mL/min   Arterial Pressure (mmHg) -243 mmHg -193 mmHg -197 mmHg   Venous Pressure (mmHg) 130  144  138    Dialysate Flow Rate (mL/min) 700 mL/min 700 mL/min 700 mL/min   Transmembrane Pressure (mmHg) 98 mmHg 97 mmHg 87 mmHg   Ultrafiltration Rate (mL/Hr) 720 mL/hr 720 mL/hr 360 mL/hr   Fluid Removal (ml) 2135 ml 2290 ml 2374 ml   Fluid Bolus (ml) 0 ml 0 ml 0 ml   Hemodialysis Comments   Arteriovenous Lines Secure Yes Yes Yes   Comments --  --  --        08/18/13 1405   Vitals   Temp 97.5 F (36.4 C)   Heart Rate ! 104    Resp Rate 21    BP 131/70 mmHg   Machine Metrics   Blood  Flow Rate (mL/min) --    Arterial Pressure (mmHg) --    Venous Pressure (mmHg) --    Dialysate Flow Rate (mL/min) --    Transmembrane Pressure (mmHg) --    Ultrafiltration Rate (mL/Hr) --    Fluid Removal (ml) 2400 ml   Fluid Bolus (ml) 200 ml   Hemodialysis Comments   Arteriovenous Lines Secure --    Comments HD Tx completed. Lines F[place] and sites held for 10 minutes per protocol. Pt has no complaints of CP, SOB, N/V.  UFG of 2L was achieved. Report given to primary nurse.              Intake/Output:  I/O this shift:  In: 85.1 [I.V.:85.1]  Out: 2450 [Urine:450; Other:2000]      Medications:  Current Facility-Administered Medications   Medication Dose Route Last Rate Last Dose   .  sodium chloride 0.9 % bolus 100 mL  100 mL Other       . sodium chloride 0.9 % bolus 250 mL  250 mL Intravenous            Education:  Education  Person taught: Patient (08/16/13 1318)  Knowledge basis: Substantial (08/16/13 1318)  Topics taught: Procedure (08/16/13 1318)  Teaching Tools: Explain (08/16/13 1318)    Primary Nurse Communication:  Bedside Nurse Communication  Name of bedside RN - pre dialysis: Daylene Katayama (08/18/13 1000)  Name of bedside RN - post dialysis: Mikki Harbor (08/16/13 1318)      [facility] nurse signature/date/time Robley Fries RN  08/18/2013 1454_________________________________

## 2013-08-18 NOTE — Plan of Care (Signed)
Problem: Moderate/High Fall Risk Score >/=15  Goal: Patient will remain free of falls  Safe environment maintained throughout shift.  Call button within reach.

## 2013-08-18 NOTE — Plan of Care (Signed)
Problem: Safety  Goal: Patient will be free from injury during hospitalization  Outcome: Progressing  Patient currently in bed and diligently calls for assistance when needing to get up to bathroom or bedside commode. Side rails up X 3. Call light within reach. Bed alarm activated. Hourly rounding performed.

## 2013-08-18 NOTE — Progress Notes (Addendum)
[facility] Progress Note      Date Time: 08/18/2013 2:31 PM             24 hr Events/Subjective:   HD just finished - 2 L off - feels tired  sats 93% on RA now   INR 1.9 - still on heparin awaiting coumadin therapeutic INR    Pleural fluid cytology negative for malignancy  Assessment:     1. L > R pleural effusion with pleuritic chest pain - left thoracentesis 8/25 for 1100 exudative serosang fluid, (-) malignancy  2. Hypoxia related to effusions & hypoventilation  3. Recent viral pericarditis  4. Diastolic dysfunction  5. ESRD on HD  6. Hyponatremia  7. LLE DVT - on coumadin: [place] 05/2013 with subtherapeutic INR on presentation  8. DM  9. HTN  10. Anemia    Plan:   1. F/u CXR  2. Leave off oxygen - goal sats 92%  3. HD per renal  4. DVT prophylaxis = on heparin/coumadin (DVT 05/2013)  5. Code status = full  6. When discharged follow up in office with Dr Nechama Guard     Medications:      Scheduled Meds: PRN Meds:           amLODIPine 2.5 mg Oral Daily   aspirin 81 mg Oral Daily   atorvastatin 10 mg Oral Daily   b complex-vitamin c-folic acid 1 tablet Oral Daily   bisacodyl 5 mg Oral Daily   colchicine 0.6 mg Oral Daily   gabapentin 300 mg Oral TID   insulin glargine 12 Units Subcutaneous QHS   lidocaine 1 patch Transdermal Q24H   pantoprazole 40 mg Oral QAM AC   pregabalin 75 mg Oral Q12H [facility]   warfarin 7.5 mg Oral Daily at 1800       Continuous Infusions:       . heparin 25000 units in dextrose 5% 500 mL 1,000 Units/hr (08/17/13 0041)         acetaminophen 650 mg Q6H PRN   alum & mag hydroxide-simethicone 30 mL Q4H PRN   dextrose 15 g PRN   dextrose 25 mL PRN   glucagon (rDNA) 1 mg PRN   heparin (porcine) 3,000 Units PRN   insulin regular 1-8 Units TID AC PRN   lidocaine  PRN   lidocaine  PRN   oxyCODONE-acetaminophen 2 tablet Q6H PRN   simethicone 80 mg Q6H PRN   sodium chloride 100 mL Q1H PRN   sodium chloride 250 mL PRN   traMADol 50 mg Q6H PRN           Physical Exam:     Filed Vitals:    08/18/13 1330 08/18/13 1345 08/18/13  1400 08/18/13 1405   BP: 107/62 92/67 130/69 131/70   Pulse: 110 105 104 104   Temp:    97.5 F (36.4 C)   TempSrc:       Resp: 20 20 20 21    Height:       Weight:       SpO2:         Temp (24hrs), Avg:98.3 F (36.8 C), Min:97.3 F (36.3 C), Max:99.3 F (37.4 C)    General Appearance:  Fatigued after dialysis, no acute distress  Lungs:    diminished  Left, bilateral basilar crackles  Heart:    regular rhythm,  no murmur   Abdomen:   soft, non-tender  Extremities:   1+ RLE >LLE  edema  Neurologic:  Alert and oriented, moves all  extremities equally      Intake/Output Summary (Last 24 hours) at 08/18/13 1431  Last data filed at 08/18/13 [id number]   Gross per 24 hour   Intake    100 ml   Output    450 ml   Net   -350 ml       Active PICC Line / CVC Line / PIV Line / Drain / Airway / Intraosseous Line / Epidural Line / ART Line / Line / Wound / Pressure Ulcer / NG/OG Tube     Name   Placement date   Placement time   Site   Days    Peripheral IV 08/07/13 Right Other (Comment)  08/07/13      Other (Comment)   2    Graft/Fistula Left          --          Labs:       Lab 08/18/13 [id number] 08/17/13 0900 08/16/13 1746 08/16/13 0210 08/15/13 0925 08/14/13 0526 08/13/13 0954   GLU 100 216* 228* 241* 118* 200* 139*        Lab 08/18/13 0639 08/17/13 0900 08/16/13 1746   WBC 7.32 8.25 7.69   RBC 2.67* 2.96* 3.02*   HGB 7.7* 8.4* 8.6*   HCT 23.2* 25.5* 26.2*   MCV 86.9 86.1 86.8   MCHC 33.2 32.9 32.8   RDW 14 14 14    MPV 10.8 11.3 11.2   PLT 301 287 257       Lab 08/16/13 1746 08/15/13 0925 08/13/13 0954   ALT -- 21 35   AST -- 15 28   GLOB -- 3.8* 3.9*   ALB 2.2* 2.3* 2.6*       Lab 08/16/13 1746 08/15/13 0925   CK 34 29   TROPI 0.01 <0.01   TROPT -- --   CKMBINDEX -- --       Lab 08/16/13 1746 08/15/13 0925   BILITOTAL -- 0.3   BILIDIRECT -- 0.2   PROT -- 6.1   ALB 2.2* --   ALT -- 21   AST -- 15       Lab 08/18/13 1109 08/18/13 0639   PT -- 21.6*   INR -- 1.9*   PTT 58* --       Lab 08/18/13 0639 08/17/13 0900 08/16/13 1746 08/15/13  0925 08/13/13 0954   NA 137 135* 136 -- --   K 4.4 4.9 4.2 -- --   CL 101 99 98 -- --   CO2 25 23 27  -- --   BUN 43* 34* 22* -- --   CREAT 3.9* 3.5* 2.8* -- --   CA 9.0 8.9 9.0 -- --   ALB -- -- 2.2* 2.3* 2.6*   PROT -- -- -- 6.1 6.5   BILITOTAL -- -- -- 0.3 0.5   ALKPHOS -- -- -- 72 77   ALT -- -- -- 21 35   AST -- -- -- 15 28   GLU 100 216* 228* -- --       Microbiology:     Pleural fluid - NG    Rads:   Radiological Procedure reviewed    8/25 CXR  1. Interval improvement of previously noted large left pleural effusion.  No pneumothorax.  2. Persistent bilateral pleural effusions with basilar airspace  opacities    8/25 CXR  Significant interval large enlargement of the left pleural  effusion    8/22 CXR  No interval change in the bilateral  pleural effusions and  atelectasis    8/17 CXR   Stable exam with small right pleural effusion and moderate left pleural  effusion.    Signed:  Chyrl Civatte NP  [facility]    Agree with above. Pleural fluid studies negative. Stable from pulmonary standpoint. Will sign off.

## 2013-08-18 NOTE — Plan of Care (Signed)
Problem: Potential for Compromised Hemodynamic Status  Goal: Stable vital signs and fluid balance  Outcome: Progressing  Patient's vitals are monitored every four hours. Vitals have been stable with oxygen saturation greater than 90%. Blood pressure has been within normal limits for the patient. Patient remains in sinus rhythm on cardiac monitor with no ectopy noted. Scheduled for dialysis in the am.

## 2013-08-18 NOTE — Progress Notes (Signed)
APTT drawn around 1100 was 58.  Per nomogram, no change in heparin rate.  Next PTT in 12hours

## 2013-08-19 ENCOUNTER — Other Ambulatory Visit: Payer: Medicare Other

## 2013-08-19 LAB — CBC AND DIFFERENTIAL
Basophils Absolute Automated: 0.04 (ref 0.00–0.20)
Basophils Absolute Automated: 0.04 (ref 0.00–0.20)
Basophils Automated: 0 %
Basophils Automated: 1 %
Eosinophils Absolute Automated: 0.22 (ref 0.00–0.70)
Eosinophils Absolute Automated: 0.23 (ref 0.00–0.70)
Eosinophils Automated: 3 %
Eosinophils Automated: 3 %
Hematocrit: 24.6 % — ABNORMAL LOW (ref 37.0–47.0)
Hematocrit: 25.6 % — ABNORMAL LOW (ref 37.0–47.0)
Hgb: 8 g/dL — ABNORMAL LOW (ref 12.0–16.0)
Hgb: 8.4 g/dL — ABNORMAL LOW (ref 12.0–16.0)
Immature Granulocytes Absolute: 0.01
Immature Granulocytes Absolute: 0.02
Immature Granulocytes: 0 %
Immature Granulocytes: 0 %
Lymphocytes Absolute Automated: 2.91 (ref 0.50–4.40)
Lymphocytes Absolute Automated: 2.92 (ref 0.50–4.40)
Lymphocytes Automated: 39 %
Lymphocytes Automated: 43 %
MCH: 28.2 pg (ref 28.0–32.0)
MCH: 28.2 pg (ref 28.0–32.0)
MCHC: 32.5 g/dL (ref 32.0–36.0)
MCHC: 32.8 g/dL (ref 32.0–36.0)
MCV: 85.9 fL (ref 80.0–100.0)
MCV: 86.6 fL (ref 80.0–100.0)
MPV: 10.9 fL (ref 9.4–12.3)
MPV: 11 fL (ref 9.4–12.3)
Monocytes Absolute Automated: 0.74 (ref 0.00–1.20)
Monocytes Absolute Automated: 0.86 (ref 0.00–1.20)
Monocytes: 11 %
Monocytes: 12 %
Neutrophils Absolute: 2.85 (ref 1.80–8.10)
Neutrophils Absolute: 3.39 (ref 1.80–8.10)
Neutrophils: 42 %
Neutrophils: 46 %
Nucleated RBC: 0 (ref 0–1)
Nucleated RBC: 0 (ref 0–1)
Platelets: 283 (ref 140–400)
Platelets: 297 (ref 140–400)
RBC: 2.84 — ABNORMAL LOW (ref 4.20–5.40)
RBC: 2.98 — ABNORMAL LOW (ref 4.20–5.40)
RDW: 14 % (ref 12–15)
RDW: 14 % (ref 12–15)
WBC: 6.78 (ref 3.50–10.80)
WBC: 7.42 (ref 3.50–10.80)

## 2013-08-19 LAB — RENAL FUNCTION PANEL
Albumin: 2.3 g/dL — ABNORMAL LOW (ref 3.5–5.0)
Anion Gap: 11 (ref 5.0–15.0)
BUN: 22 mg/dL — ABNORMAL HIGH (ref 7–19)
CO2: 27 (ref 22–29)
Calcium: 9.1 mg/dL (ref 8.5–10.5)
Chloride: 95 — ABNORMAL LOW (ref 98–107)
Creatinine: 2.9 mg/dL — ABNORMAL HIGH (ref 0.6–1.0)
Glucose: 173 mg/dL — ABNORMAL HIGH (ref 70–100)
Phosphorus: 4.2 mg/dL (ref 2.3–4.7)
Potassium: 4.5 (ref 3.5–5.1)
Sodium: 133 — ABNORMAL LOW (ref 136–145)

## 2013-08-19 LAB — BASIC METABOLIC PANEL
Anion Gap: 13 (ref 5.0–15.0)
BUN: 27 mg/dL — ABNORMAL HIGH (ref 7–19)
CO2: 25 (ref 22–29)
Calcium: 8.9 mg/dL (ref 8.5–10.5)
Chloride: 96 — ABNORMAL LOW (ref 98–107)
Creatinine: 3.4 mg/dL — ABNORMAL HIGH (ref 0.6–1.0)
Glucose: 210 mg/dL — ABNORMAL HIGH (ref 70–100)
Potassium: 4.4 (ref 3.5–5.1)
Sodium: 134 — ABNORMAL LOW (ref 136–145)

## 2013-08-19 LAB — APTT
PTT: 60 — ABNORMAL HIGH (ref 23–37)
PTT: 61 — ABNORMAL HIGH (ref 23–37)

## 2013-08-19 LAB — GFR
EGFR: 16.2
EGFR: 19.5

## 2013-08-19 LAB — PT/INR
PT INR: 1.9 — ABNORMAL HIGH (ref 0.9–1.1)
PT: 21.7 — ABNORMAL HIGH (ref 12.6–15.0)

## 2013-08-19 LAB — MAGNESIUM: Magnesium: 1.9 mg/dL (ref 1.6–2.6)

## 2013-08-19 LAB — PHOSPHORUS: Phosphorus: 4.7 mg/dL (ref 2.3–4.7)

## 2013-08-19 LAB — POCT GLUCOSE
Whole Blood Glucose POCT: 189 mg/dL — AB (ref 70–100)
Whole Blood Glucose POCT: 199 mg/dL — AB (ref 70–100)

## 2013-08-19 MED ORDER — AMLODIPINE BESYLATE 2.5 MG PO TABS
2.5000 mg | ORAL_TABLET | Freq: Every day | ORAL | Status: AC
Start: 2013-08-19 — End: 2013-09-19

## 2013-08-19 MED ORDER — LIDOCAINE 5 % EX PTCH
1.0000 | MEDICATED_PATCH | Freq: Every day | CUTANEOUS | Status: AC
Start: 2013-08-19 — End: 2013-09-19

## 2013-08-19 MED ORDER — ACETAMINOPHEN 500 MG PO TABS
500.0000 mg | ORAL_TABLET | Freq: Two times a day (BID) | ORAL | Status: DC | PRN
Start: 2013-08-19 — End: 2021-03-05

## 2013-08-19 MED ORDER — OXYCODONE-ACETAMINOPHEN 5-325 MG PO TABS
2.0000 | ORAL_TABLET | Freq: Four times a day (QID) | ORAL | Status: DC | PRN
Start: 2013-08-19 — End: 2018-04-08

## 2013-08-19 MED ORDER — WARFARIN SODIUM 7.5 MG PO TABS
7.5000 mg | ORAL_TABLET | Freq: Every day | ORAL | Status: AC
Start: 2013-08-19 — End: 2013-09-19

## 2013-08-19 NOTE — Final Progress Note (DC Note for stay less than 48 (Signed)
Telemonitor returned, IV catheter removed intact. D/C education, including Warfarin/Coumadin instructions, provided to pt at bedside. Pt departed unit safely via wheelchair with family driving her home.

## 2013-08-19 NOTE — Progress Notes (Signed)
Mary [name] is a 67 y.o. female patient with PMH notable for  DM, HTN, ESRD on hemodialysis, admitted because of SOB.    Active Problems:   Chest pain    Past Medical History   Diagnosis Date   . Diabetes mellitus without complication    . Hyperlipidemia    . Hypertensive disorder    . Chronic kidney disease      dialysis t-th-sat   . Arthritis    . Cataracts, bilateral    . GERD (gastroesophageal reflux disease)    . Acute deep vein thrombosis (DVT) of distal vein of left lower extremity    . Hemodialysis access site with mature fistula      Current Facility-Administered Medications   Medication Dose Route Frequency Provider Last Rate Last Dose   . acetaminophen (TYLENOL) tablet 650 mg  650 mg Oral Q6H PRN Laroy Apple, MD       . alum & mag hydroxide-simethicone (MAALOX PLUS) 200-200-20 mg/5 mL suspension 30 mL  30 mL Oral Q4H PRN Noralyn Pick, MD       . amLODIPine [facility]) tablet 2.5 mg  2.5 mg Oral Daily Noralyn Pick, MD   2.5 mg at 08/19/13 1034   . aspirin chewable tablet 81 mg  81 mg Oral Daily Laroy Apple, MD   81 mg at 08/19/13 1034   . atorvastatin (LIPITOR) tablet 10 mg  10 mg Oral Daily Laroy Apple, MD   10 mg at 08/18/13 2116   . b complex-vitamin c-folic acid (NEPHRO-VITE) tablet 1 tablet  1 tablet Oral Daily Laroy Apple, MD   1 tablet at 08/19/13 1034   . bisacodyl (DULCOLAX) EC tablet 5 mg  5 mg Oral Daily Noralyn Pick, MD   5 mg at 08/19/13 1034   . colchicine tablet 0.6 mg  0.6 mg Oral Daily Laroy Apple, MD   0.6 mg at 08/19/13 1034   . dextrose (GLUCOSE) 40 % oral gel 15 g  15 g Oral PRN Laroy Apple, MD       . dextrose 50 % bolus 25 mL  25 mL Intravenous PRN Laroy Apple, MD       . gabapentin (NEURONTIN) capsule 300 mg  300 mg Oral TID Laroy Apple, MD   300 mg at 08/19/13 1034   . glucagon (rDNA) (GLUCAGEN) injection 1 mg  1 mg Intramuscular PRN Laroy Apple, MD       . heparin (porcine) injection 3,000 Units  3,000 Units Intravenous PRN Noralyn Pick, MD   3,000 Units at 08/15/13 2012   . heparin 25000 units in dextrose 5% 500 mL infusion (premix)  1,000 Units/hr Intravenous Continuous Noralyn Pick, MD 12 mL/hr at 08/18/13 1507 600 Units/hr at 08/18/13 1507   . insulin glargine (LANTUS) injection 12 Units  12 Units Subcutaneous QHS Laroy Apple, MD   12 Units at 08/19/13 0025   . insulin regular (HumuLIN R,NovoLIN R) injection 1-8 Units  1-8 Units Subcutaneous TID AC PRN Laroy Apple, MD   1 Units at 08/18/13 [id number]   . lidocaine (LIDODERM) 5 % 1 patch  1 patch Transdermal Q24H Quintin Alto, [place] A, MD   1 patch at 08/18/13 1458   . lidocaine (LMX PLUS 4%) 4 % dressing   Topical PRN Alric Quan, MD       . lidocaine (LMX PLUS 4%) 4 % dressing   Topical PRN  Alric Quan, MD   1 each at 08/18/13 [phone]   . oxyCODONE-acetaminophen (PERCOCET) 5-325 MG per tablet 2 tablet  2 tablet Oral Q6H PRN Noralyn Pick, MD   2 tablet at 08/18/13 2116   . pantoprazole (PROTONIX) EC tablet 40 mg  40 mg Oral QAM AC Laroy Apple, MD   40 mg at 08/19/13 0810   . pregabalin (LYRICA) capsule 75 mg  75 mg Oral Q12H [place] Laroy Apple, MD   75 mg at 08/19/13 1034   . simethicone (MYLICON) chewable tablet 80 mg  80 mg Oral Q6H PRN Noralyn Pick, MD   80 mg at 08/13/13 1452   . [EXPIRED] sodium chloride 0.9 % bolus 100 mL  100 mL Other Q1H PRN Alric Quan, MD       . [EXPIRED] sodium chloride 0.9 % bolus 250 mL  250 mL Intravenous PRN Alric Quan, MD       . traMADol Veatrice Bourbon) tablet 50 mg  50 mg Oral Q6H PRN Laroy Apple, MD   50 mg at 08/16/13 2225   . warfarin (COUMADIN) tablet 7.5 mg  7.5 mg Oral Daily at 1800 Noralyn Pick, MD   7.5 mg at 08/18/13 1744     Allergies   Allergen Reactions   . Penicillins Hives   . Shrimp (Shellfish  Allergy) Swelling     Blood pressure 121/69, pulse 110, temperature 97.8 F (36.6 C), temperature source Oral, resp. rate 18, height 1.626 m (5\' 4" ), weight 92.987 kg (205 lb), SpO2 93.00%.    Subjective:  Symptoms:  She reports shortness of breath.    Diet:  Adequate intake.    Activity level: Impaired due to weakness.      Objective:  General Appearance:  In no acute distress.    Vital signs: (most recent): Blood pressure 121/69, pulse 110, temperature 97.8 F (36.6 C), temperature source Oral, resp. rate 18, height 1.626 m (5\' 4" ), weight 92.987 kg (205 lb), SpO2 93.00%.    Output: No urine output and producing stool.    Lungs:  There are decreased breath sounds.    Heart: Normal rate.    Neurological: Patient is alert.    Skin:  Warm.    Abdomen: Abdomen is soft.      Results     Procedure Component Value Units Date/Time    Basic Metabolic Panel [id number]  (Abnormal) Collected:08/19/13 0529    Specimen Information:Blood Updated:08/19/13 0800     Glucose 210 (H) mg/dL      BUN 27 (H) mg/dL      Creatinine 3.4 (H) mg/dL      CALCIUM 8.9 mg/dL      Sodium 134 (L)      Potassium 4.4      Chloride 96 (L)      CO2 25      Anion Gap 13.0     Magnesium [[id number]] Collected:08/19/13 0529    Specimen Information:Blood Updated:08/19/13 0800     Magnesium 1.9 mg/dL     Phosphorus [[id number]] Collected:08/19/13 0529    Specimen Information:Blood Updated:08/19/13 0800     Phosphorus 4.7 mg/dL     GFR [[id number]] Collected:08/19/13 0529     EGFR 16.2 Updated:08/19/13 0800    Prothrombin time/INR [[id number]]  (Abnormal) Collected:08/19/13 0529    Specimen Information:Blood Updated:08/19/13 0707     PT 21.7 (H)      PT INR 1.9 (H)      PT Anticoag. Given Within  48 hrs. heparin     CBC and differential [[id number]]  (Abnormal) Collected:08/19/13 0529    Specimen Information:Blood / Blood Updated:08/19/13 0707     WBC 6.78      RBC 2.84 (L)      Hgb 8.0 (L) g/dL      Hematocrit 24.6 (L) %      MCV 86.6 fL      MCH 28.2 pg       MCHC 32.5 g/dL      RDW 14 %      Platelets 297      MPV 11.0 fL      Neutrophils 42 %      Lymphocytes Automated 43 %      Monocytes 11 %      Eosinophils Automated 3 %      Basophils Automated 1 %      Immature Granulocyte 0 %      Nucleated RBC 0      Neutrophils Absolute 2.85      Abs Lymph Automated 2.92      Abs Mono Automated 0.74      Abs Eos Automated 0.23      Absolute Baso Automated 0.04      Absolute Immature Granulocyte 0.01     POCT glucose (AC and HS) [id number]  (Abnormal) Collected:08/19/13 0650     POCT Glucose WB 189 (A) mg/dL Updated:08/19/13 [id number]    Renal function panel (pre dialysis - first dialysis of the week) [id number]  (Abnormal) Collected:08/18/13 2351    Specimen Information:Blood Updated:08/19/13 0056     Glucose 173 (H) mg/dL      Sodium 133 (L)      Potassium 4.5      Chloride 95 (L)      CO2 27      BUN 22 (H) mg/dL      CALCIUM 9.1 mg/dL      Creatinine 2.9 (H) mg/dL      Albumin 2.3 (L) g/dL      Phosphorus 4.2 mg/dL      Anion Gap 11.0     Narrative:    For 6 Hours    GFR [id number] Collected:08/18/13 2351     EGFR 19.5 Updated:08/19/13 0056    Narrative:    For 6 Hours    APTT [id number]  (Abnormal) Collected:08/18/13 2351     PTT 61 (H) Updated:08/19/13 0040     APTT Anticoag. Given w/i 48 hrs. heparin     Narrative:    For 6 Hours    CBC and differential (pre dialysis - first dialysis of the week) [id number]  (Abnormal) Collected:08/18/13 2351    Specimen Information:Blood / Blood Updated:08/19/13 0038     WBC 7.42      RBC 2.98 (L)      Hgb 8.4 (L) g/dL      Hematocrit 25.6 (L) %      MCV 85.9 fL      MCH 28.2 pg      MCHC 32.8 g/dL      RDW 14 %      Platelets 283      MPV 10.9 fL      Neutrophils 46 %      Lymphocytes Automated 39 %      Monocytes 12 %      Eosinophils Automated 3 %      Basophils Automated 0 %      Immature Granulocyte 0 %      Nucleated RBC  0      Neutrophils Absolute 3.39      Abs Lymph Automated 2.91      Abs Mono Automated 0.86      Abs Eos  Automated 0.22      Absolute Baso Automated 0.04      Absolute Immature Granulocyte 0.02     Narrative:    For 6 Hours    POCT glucose (AC and HS) [id number]  (Abnormal) Collected:08/18/13 2116     POCT Glucose WB 219 (A) mg/dL Updated:08/18/13 2116    POCT glucose (AC and HS) [id number]  (Abnormal) Collected:08/18/13 1714     POCT Glucose WB 191 (A) mg/dL Updated:08/18/13 1716    Body fluid culture [id number] Collected:08/15/13 1630    Specimen Information:Body Fluid / Pleural Fluid Updated:08/18/13 1434    Narrative:    To be collected in IR  ORDER#: [id number]                                    ORDERED BY: Porfirio Oar  SOURCE: Pleural Fluid pleural fluid                  COLLECTED:  08/15/13 16:30  ANTIBIOTICS AT COLL.:                                RECEIVED :  08/15/13 19:29  Stain, Gram                                FINAL       08/15/13 20:25  08/15/13   Rare WBCs             Rare Epithelial cells             No organisms seen  Culture Body Fluid                         FINAL       08/18/13 14:34  08/18/13   No growth      APTT [id number]  (Abnormal) Collected:08/18/13 1109     PTT 58 (H) Updated:08/18/13 1138     APTT Anticoag. Given w/i 48 hrs. heparin     POCT glucose (AC and HS) [id number]  (Abnormal) Collected:08/18/13 1134     POCT Glucose WB 140 (A) mg/dL Updated:08/18/13 1135    Anaerobic culture [id number] Collected:08/15/13 1630    Specimen Information:Body Fluid / Pleural Fluid Updated:08/18/13 1133    Narrative:    To be collected in IR  ORDER#: [id number]                                    ORDERED BY: Porfirio Oar  SOURCE: Pleural Fluid pleural fluid                  COLLECTED:  08/15/13 16:30  ANTIBIOTICS AT COLL.:                                RECEIVED :  08/15/13 19:29  Stain, Gram was cancelled on 08/15/13 at 11:39 by HIS; RBS rule  Culture Anaerobic  FINAL       08/18/13 11:33  08/18/13   No anaerobic growth        Echocardiogram Adult Complete W Clr/ Dopp  Waveform    08/01/2013  impression: 1. Atrial septal aneurysm is present. 2. There is a small to moderate pericardial effusion noted both anteriorly and posteriorly.  this is only slightly increased from the previous echocardiogram dated 07/20/2013. Variation of tricuspid flow velocity is noted. There is no diastolic collapse of the right ventricle noted. There is organized material note noted in the apex of the right ventricle not previously reported. Small small pleural effusion is present. Clinical correlation is required considering these echocardiographic changes. 3. There are no other significant echogram echocardiographic changes noted.  Lennon Alstrom, MD  08/01/2013 2:03 PM     Echocardiogram Adult Complete W Color Doppler Waveform    07/20/2013   Normal left ventricular systolic function. No significant valvular heart disease. Small pericardial effusion without evidence of cardiac tamponade.    Audria Nine, MD  07/20/2013 4:09 PM     Xr Chest 2 Views    08/18/2013   Stable bilateral pleural effusions, left greater than right.  Susy Manor, MD  08/18/2013 8:39 PM     Xr Chest 2 Views    08/12/2013   1. No interval change in the bilateral pleural effusions and atelectasis.  Dennison Mascot, MD  08/12/2013 8:52 AM     Xr Chest 2 Views    08/10/2013   Small pleural effusions and basilar atelectasis, decreased from prior.  Loel Ro, MD  08/10/2013 10:22 AM     Xr Chest Ap Portable    08/15/2013    1. Interval improvement of previously noted large left pleural effusion. No pneumothorax. 2. Persistent bilateral pleural effusions with basilar airspace opacities.  Darin Engels, MD  08/15/2013 5:10 PM     Xr Chest Ap Portable    08/15/2013   Significant interval large enlargement of the left pleural effusion.  Results given via telephone to Dr. Daleen Snook on 08/15/2013 at 11:31 AM  Pleas Koch, MD  08/15/2013 11:32 AM     Xr Chest Ap Portable    08/07/2013   Stable exam with small right pleural effusion and moderate left pleural  effusion.  Loel Lofty. Anna Genre, MD  08/07/2013 9:38 AM     Chest Ap Portable    07/30/2013    1. New bilateral pleural effusions with bibasilar airspace opacities. 2. Stable cardiomegaly.  Darin Engels, MD  07/30/2013 4:33 PM     Thoracentesis    08/15/2013   Successful thoracentesis. 1100 mL of serosanguineous pleural fluid was acquired.  Kathaleen Grinder, MD  08/15/2013 4:49 PM     Assessment:  (1.chest pain  2.Left pleural effusion  3.viral pericarditis  4.ESRD  5.DM  6.MGUS  7.HTN  8.Left LE DVT  9.anemia).       Plan:   (1.S/P left thoracentesis  2.Hemodialysis with UF yesterday  3.monitor serum electrolytes  4.on IV heparin and coumadin anticoagulation  ).         Alric Quan  08/19/2013

## 2013-08-19 NOTE — Progress Notes (Signed)
Sabrina from lab called and said timed aPTT draw at 1150 was unsuccessful due to poor venous access.     Dr. Debroah Loop notified of unsuccessful attempt and verbalized order to discontinue Heparin drip and D/C pt.

## 2013-08-19 NOTE — Progress Notes (Signed)
Nutritional Support Services  Nutrition Follow Up    Mary [name] 67 y.o. female   MRN: [id number]        Nutrition Summary: Pt seen with lunch tray at bedside. Pt reports good po intake, is ordering 3 meals/day. Familiar with prescribed diet.      Nutrition Diagnosis:   Food and nutrition knowledge deficit  related to suspected competing value systems as evidenced by reports of chronic meal skipping on dialysis days.  Status - resolved.    Intervention:  1. Rec increasing protein allowance to 80 gm in light of chronic HD.     Goal: Pt to avoid meal skipping during hospitalization.             Status: completed.       Assessment Data:  Adm dx:  CP   Patient Active Problem List   Diagnosis   . Cellulitis of left leg   . ESRD (end stage renal disease)   . DM (diabetes mellitus)   . Hypertension   . Cellulitis   . Chest pain at rest   . Viral pericarditis   . Dyspnea   . Chest pain   . Pleural effusion         Pertinent labs:  Lab 08/19/13 0529 08/18/13 2351 08/18/13 0639 08/17/13 0900 08/16/13 1746   NA 134* 133* 137 135* 136   K 4.4 4.5 4.4 4.9 4.2   CL 96* 95* 101 99 98   CO2 25 27 25 23 27    BUN 27* 22* 43* 34* 22*   CREAT 3.4* 2.9* 3.9* 3.5* 2.8*   GLU 210* 173* 100 216* 228*   CA 8.9 9.1 9.0 8.9 9.0   MG 1.9 -- 1.9 -- --   PHOS 4.7 4.2 4.9* -- 3.8         Current Diet Order  Diet consistent carbohydrate and renal 50 GM Protein      Food intake: good      Anthropometrics  Height: 162.6 cm (5\' 4" )  Weight: 92.987 kg (205 lb)  Weight Change: -3.76   IBW/kg (Calculated) Female: 54.54 kg  BMI (calculated): 35.4       Monitoring/Evaluation:   Continue to monitor po intake, labs.  F/U per LOS.        Cloria Spring, [place]. [phone]

## 2013-08-19 NOTE — Plan of Care (Signed)
Problem: Safety  Goal: Patient will be free from injury during hospitalization  Outcome: Progressing  Intervention: Hourly rounding.  Safety precaution maintained during this shift. Call bell within pt reach. Heparin drip infusing and aptt within therapeutic level. Will continue to monitor.

## 2013-08-19 NOTE — Discharge Instructions (Signed)
Pleural Effusion  Your healthcare provider has told you that you have pleural effusion. Read on to learn more about pleural effusion and how it can be treated.  What Is Pleural Effusion?     Pleural effusion is fluid buildup between the layers of tissue that line the outside of the lungs.   Pleural effusion occurs when fluid builds up in an area in the chest called the pleural space. This space is between the layersof tissue (pleura) that line the outside of the lungs. The pleural space usually holds only a small amount of fluid. This fluid lubricates the pleura. But if too much fluid fills the space, it can make it hard or painful to breathe. Chest infections (such as pneumonia) and heart disease are the most common causes. Less common causes include lung cancer.  What Are the Two Types of Pleural Effusion?   Transudativepleural effusion: This happens when fluid leaks into the pleural space from outside the pleura. The leakage may be due to increased pressure in the blood vessels. Or, low protein in the blood vessels can cause the leak.   Exudativepleural effusion: This occurs when the pleura itself becomes "leaky." This is often due to the pleura being inflamed. Pleural inflammation can be caused by illness, especially lung cancer and other lung diseases.  How Is Pleural Effusion Diagnosed?  Your healthcare provider examines you and asks about your health history. You may have blood tests to take samples of your blood. Samples of the fluid in the pleural space may also be taken. And imaging tests, such as a chest x-ray, CT scan, or ultrasound, may be done to take detailed pictures of the inside of the body.  How Is Pleural Effusion Treated?  The extra fluid may be drained from the pleural space. This is done with a procedure called therapeutic thoracentesis. It relieves pressure on the lungs by removing the fluid buildup. You may have other treatments, depending on the cause of your pleural effusion. If  it's due to a bacterial infection, antibiotics (medications that fight infection) will be given. If it's due to a heart condition, you will receive medications for your heart. In some cases, a tube is placed in the chest to drain the extra fluid. The tube will likely stay in place for several days. Your healthcare provider can tell you more about the cause of your pleural effusion and your treatment options.  What Are the Long-term Concerns?  With treatment, pleural effusion often can be cured or improved. Untreated, it can lead to serious health problems, such as collapsed lung. Ongoing treatment may be needed to help control or cure the underlying cause of your pleural effusion. This may requirehaving procedures or taking medications for months or years. Your healthcare provider can tell you more if needed.  Call the healthcare provider right away if you have any of the following:   Severe trouble breathing (call 911)   Severe chest pain (call 911)   Skin that turns blue (call 911)   Cough   Fever of100.4For higher    [address] [place] [address]. All rights reserved. This information is not intended as a substitute for professional medical care. Always follow your healthcare professional's instructions.

## 2013-08-19 NOTE — Discharge Summary (Signed)
[facility]      Patient: Mary [name]  Admission Date: 08/07/2013   DOB: 19-Jan-1946  Discharge Date: 08/19/2013    MRN: [id number]  Discharge Attending: Kelli Churn MD   Referring Physician: Alric Quan, MD  PCP: Alric Quan, MD       DISCHARGE SUMMARY     Discharge Information   Admission Diagnosis:   Chest pain and SOB      Discharge Diagnosis:   Patient Active Problem List    Diagnosis Date Noted   . Dyspnea 07/30/2013   . Chest pain 07/30/2013   . Pleural effusion 07/30/2013   . Viral pericarditis 07/21/2013   . Chest pain at rest 07/19/2013   . Cellulitis of left leg 06/21/2013   . ESRD (end stage renal disease) 06/21/2013   . DM (diabetes mellitus) 06/21/2013   . Hypertension 06/21/2013   . Cellulitis 06/21/2013        Admission Condition: poor  Discharge Condition: good  Functional Status: Patient is independent with mobility/ambulation, transfers, ADL's, IADL's.    Discharge Medications:     Medication List       As of 08/19/2013 12:49 PM      ASK your doctor about these medications           acetaminophen 500 MG tablet    Commonly known as: TYLENOL        amLODIPine 5 MG tablet    Commonly known as: NORVASC    Take 1 tablet (5 mg total) by mouth daily.        aspirin 81 MG tablet        atorvastatin 10 MG tablet    Commonly known as: LIPITOR        b complex-vitamin c-folic acid 0.8 MG Tabs        bisacodyl 5 MG EC tablet    Commonly known as: DULCOLAX        colchicine 0.6 MG tablet    Take 1 tablet (0.6 mg total) by mouth daily.        cyclobenzaprine 10 MG tablet    Commonly known as: FLEXERIL        esomeprazole 40 MG capsule    Commonly known as: NexIUM        gabapentin 300 MG capsule    Commonly known as: NEURONTIN        insulin aspart 100 UNIT/ML injection    Commonly known as: NovoLOG        insulin glargine 100 UNIT/ML injection    Commonly known as: LANTUS        pregabalin 75 MG capsule    Commonly known as: LYRICA        traMADol 50 MG tablet     Commonly known as: ULTRAM    Take 1 tablet (50 mg total) by mouth every 6 (six) hours as needed for Pain.        warfarin 5 MG tablet    Commonly known as: COUMADIN    Take 1 tablet (5 mg total) by mouth daily.                 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 Pt was transferred from [facility] ER. Pt states that after dialysis she developed acute  onset of right sided chest discomfort described as a heavy pressure in the middle of her chest. The discomfort was worse with movement, and the pain was rated a 7-10/10.     See HPI for details.    Hospital Course (12 Days)   During this admission the patient was seen by cardiology for her chest pain. The pain was thought to be secondary to fluid overload from her ESRD and diastolic CHF. The pt's chest pain improved after multiple rounds of HD. Pt is also being treated with colchicine for recurrent recent pericarditis. Pt also underwent thoracentesis on 8/25 this admission because of persistent bilateral pleural effusions. The cytology was negative, fluid culture NGSF, and fluid was consistent with exudate. Because her INR was subtherapeutic, pt was also started on heparin gtt and bridged to coumadin. She will be discharged on warfarin 7.5 mg PO daily and will have her INR rechecked in Dr. Cathi Roan office on Tuesday at 1:30 pm. Pt stable for discharge home today with outpatient follow up       Procedures/Imaging:   XR CHEST AP PORTABLE    Final Result:      Stable exam with small right pleural effusion and moderate left pleural    effusion.        Loel Lofty. Anna Genre, MD     08/07/2013 9:38 AM   XR CHEST 2 VIEWS    Final Result:  Small pleural effusions and basilar atelectasis, decreased     from prior.        Loel Ro, MD     08/10/2013 10:22 AM   XR CHEST 2 VIEWS     Final Result:      1. No interval change in the bilateral pleural effusions and    atelectasis.        Dennison Mascot, MD     08/12/2013 8:52 AM   XR CHEST AP PORTABLE    Final Result:  Significant interval large enlargement of the left pleural     effusion.        Results given via telephone to Dr. Daleen Snook on 08/15/2013 at 11:31 AM        Pleas Koch, MD     08/15/2013 11:32 AM   THORACENTESIS    Final Result:      Successful thoracentesis. 1100 mL of serosanguineous pleural fluid was    acquired.        Kathaleen Grinder, MD     08/15/2013 4:49 PM   XR CHEST AP PORTABLE    Final Result:          1. Interval improvement of previously noted large left pleural effusion.    No pneumothorax.    2. Persistent bilateral pleural effusions with basilar airspace    opacities.        Darin Engels, MD     08/15/2013 5:10 PM   XR CHEST 2 VIEWS    Final Result:  Stable bilateral pleural effusions, left greater than right.         Susy Manor, MD     08/18/2013 8:39 PM   XR CHEST AP PORTABLE    (Results Pending)       Treatment Team:   Attending Provider: Trilby Drummer, MD  Consulting Physician: Trilby Drummer, MD       Best Practices   Was the patient admitted with either a CHF Exacerbation or Pneumonia? No     Progress Note/Physical Exam at  Discharge     Subjective: Pt seen and examined. Doing well. No chest pain, no SOB, asymptomatic. Afebrile overnight. No other complaints    Filed Vitals:    08/18/13 2306 08/19/13 0427 08/19/13 0835 08/19/13 1235   BP: 94/58 138/57 121/69 105/53   Pulse: 96 96 110 87   Temp: 98.8 F (37.1 C) 97.3 F (36.3 C) 97.8 F (36.6 C)    TempSrc:       Resp: 18 18  18    Height:       Weight:       SpO2: 92% 93% 93% 90%       General: NAD, AAOx3  HEENT: perrla, eomi, sclera anicteric, OP: Clear, MMM  Neck: supple, FROM, no LAD  Cardiovascular: RRR, no m/r/g  Lungs: fine crackles bilaterally at bases   Abdomen: soft, +BS, NT/ND, no masses, no g/r  Extremities: no C/C/E  Skin: no rashes or lesions  noted       Diagnostics     Labs/Studies Pending at Discharge: No    Last Labs     Lab 08/19/13 0529 08/18/13 2351 08/18/13 0639   WBC 6.78 7.42 7.32   RBC 2.84* 2.98* 2.67*   HGB 8.0* 8.4* 7.7*   HCT 24.6* 25.6* 23.2*   MCV 86.6 85.9 86.9   PLT 297 283 301         Lab 08/19/13 0529 08/18/13 2351 08/18/13 0639 08/17/13 0900 08/16/13 1746   NA 134* 133* 137 135* 136   K 4.4 4.5 4.4 4.9 4.2   CL 96* 95* 101 99 98   CO2 25 27 25 23 27    BUN 27* 22* 43* 34* 22*   CREAT 3.4* 2.9* 3.9* 3.5* 2.8*   GLU 210* 173* 100 216* 228*   CA 8.9 9.1 9.0 8.9 9.0   MG 1.9 -- 1.9 -- --        Patient Instructions   Discharge Diet: cardiac diet  Discharge Activity:  activity as tolerated    Follow Up Appointment:  Follow-up Information     Follow up with Felicity Coyer, MD. In 2 weeks.    Contact information:    [address]  [place] [address]  [phone]          Follow up with Alric Quan, MD. In 4 days. (Please come to the office on Tuesday at 1:30 pm)     Contact information:    [address]  [place] [address]  [phone]                 Time spent examining patient, discussing with patient/family regarding hospital course, chart review, reconciling medications and discharge planning: 40 minutes.    Windy Fast , MD  12:49 PM 08/19/2013

## 2013-08-23 ENCOUNTER — Other Ambulatory Visit: Payer: Self-pay | Admitting: Nephrology

## 2013-08-23 ENCOUNTER — Ambulatory Visit
Admission: RE | Admit: 2013-08-23 | Discharge: 2013-08-23 | Disposition: A | Discharge status: Home / Self Care | Payer: Medicare Other | Source: Ambulatory Visit | Attending: Nephrology | Admitting: Nephrology

## 2013-08-23 DIAGNOSIS — J90 Pleural effusion, not elsewhere classified: Secondary | ICD-10-CM | POA: Insufficient documentation

## 2013-10-25 ENCOUNTER — Ambulatory Visit
Admission: RE | Admit: 2013-10-25 | Discharge: 2013-10-25 | Disposition: A | Discharge status: Home / Self Care | Payer: Medicare Other | Source: Ambulatory Visit | Attending: Nephrology | Admitting: Nephrology

## 2013-10-25 ENCOUNTER — Other Ambulatory Visit: Payer: Self-pay | Admitting: Nephrology

## 2013-10-25 DIAGNOSIS — J984 Other disorders of lung: Secondary | ICD-10-CM | POA: Insufficient documentation

## 2013-10-25 DIAGNOSIS — R0602 Shortness of breath: Secondary | ICD-10-CM | POA: Insufficient documentation

## 2013-10-25 DIAGNOSIS — N186 End stage renal disease: Secondary | ICD-10-CM | POA: Insufficient documentation

## 2013-11-11 ENCOUNTER — Emergency Department: Payer: Medicare Other

## 2013-11-11 ENCOUNTER — Emergency Department
Admission: EM | Admit: 2013-11-11 | Discharge: 2013-11-11 | Disposition: A | Discharge status: Home / Self Care | Payer: Medicare Other | Attending: Emergency Medical Services | Admitting: Emergency Medical Services

## 2013-11-11 DIAGNOSIS — K219 Gastro-esophageal reflux disease without esophagitis: Secondary | ICD-10-CM | POA: Insufficient documentation

## 2013-11-11 DIAGNOSIS — M129 Arthropathy, unspecified: Secondary | ICD-10-CM | POA: Insufficient documentation

## 2013-11-11 DIAGNOSIS — Z7982 Long term (current) use of aspirin: Secondary | ICD-10-CM | POA: Insufficient documentation

## 2013-11-11 DIAGNOSIS — I129 Hypertensive chronic kidney disease with stage 1 through stage 4 chronic kidney disease, or unspecified chronic kidney disease: Secondary | ICD-10-CM | POA: Insufficient documentation

## 2013-11-11 DIAGNOSIS — Z794 Long term (current) use of insulin: Secondary | ICD-10-CM | POA: Insufficient documentation

## 2013-11-11 DIAGNOSIS — R0789 Other chest pain: Secondary | ICD-10-CM | POA: Insufficient documentation

## 2013-11-11 DIAGNOSIS — Z86718 Personal history of other venous thrombosis and embolism: Secondary | ICD-10-CM | POA: Insufficient documentation

## 2013-11-11 DIAGNOSIS — Z880 Allergy status to penicillin: Secondary | ICD-10-CM | POA: Insufficient documentation

## 2013-11-11 DIAGNOSIS — Z91013 Allergy to seafood: Secondary | ICD-10-CM | POA: Insufficient documentation

## 2013-11-11 DIAGNOSIS — E785 Hyperlipidemia, unspecified: Secondary | ICD-10-CM | POA: Insufficient documentation

## 2013-11-11 DIAGNOSIS — Z7901 Long term (current) use of anticoagulants: Secondary | ICD-10-CM | POA: Insufficient documentation

## 2013-11-11 DIAGNOSIS — N189 Chronic kidney disease, unspecified: Secondary | ICD-10-CM | POA: Insufficient documentation

## 2013-11-11 DIAGNOSIS — E119 Type 2 diabetes mellitus without complications: Secondary | ICD-10-CM | POA: Insufficient documentation

## 2013-11-11 LAB — CBC AND DIFFERENTIAL
Basophils Absolute Automated: 0.03 (ref 0.00–0.20)
Basophils Automated: 0 %
Eosinophils Absolute Automated: 0.29 (ref 0.00–0.70)
Eosinophils Automated: 5 %
Hematocrit: 34.8 % — ABNORMAL LOW (ref 37.0–47.0)
Hgb: 11.5 g/dL — ABNORMAL LOW (ref 12.0–16.0)
Immature Granulocytes Absolute: 0
Immature Granulocytes: 0 %
Lymphocytes Absolute Automated: 2.46 (ref 0.50–4.40)
Lymphocytes Automated: 40 %
MCH: 29.8 pg (ref 28.0–32.0)
MCHC: 33 g/dL (ref 32.0–36.0)
MCV: 90.2 fL (ref 80.0–100.0)
MPV: 11.7 fL (ref 9.4–12.3)
Monocytes Absolute Automated: 0.71 (ref 0.00–1.20)
Monocytes: 12 %
Neutrophils Absolute: 2.7 (ref 1.80–8.10)
Neutrophils: 44 %
Nucleated RBC: 0 (ref 0–1)
Platelets: 183 (ref 140–400)
RBC: 3.86 — ABNORMAL LOW (ref 4.20–5.40)
RDW: 17 % — ABNORMAL HIGH (ref 12–15)
WBC: 6.19 (ref 3.50–10.80)

## 2013-11-11 LAB — BASIC METABOLIC PANEL
Anion Gap: 14 (ref 5.0–15.0)
BUN: 47 mg/dL — ABNORMAL HIGH (ref 7–19)
CO2: 26 mEq/L (ref 22–29)
Calcium: 9.5 mg/dL (ref 8.5–10.5)
Chloride: 101 mEq/L (ref 98–107)
Creatinine: 5.7 mg/dL — ABNORMAL HIGH (ref 0.6–1.0)
Glucose: 174 mg/dL — ABNORMAL HIGH (ref 70–100)
Potassium: 4.5 mEq/L (ref 3.5–5.1)
Sodium: 141 mEq/L (ref 136–145)

## 2013-11-11 LAB — PT AND APTT
PT INR: 2.1 — ABNORMAL HIGH (ref 0.9–1.1)
PT: 23 — ABNORMAL HIGH (ref 12.6–15.0)
PTT: 36 (ref 23–37)

## 2013-11-11 LAB — TROPONIN I: Troponin I: 0.01 ng/mL (ref 0.00–0.09)

## 2013-11-11 LAB — GFR: EGFR: 8.9

## 2013-11-11 LAB — CKMB: Creatinine Kinase MB (CKMB): 1.8 ng/mL (ref 0.0–4.9)

## 2013-11-11 LAB — CK: Creatine Kinase (CK): 255 U/L — ABNORMAL HIGH (ref 29–168)

## 2013-11-11 NOTE — ED Notes (Signed)
Pt to ED with c/o's R sided chest wall pain x 3 days. Pt seen at [facility] this week and Dx'd with chest wall strain. Pt provided Percocet with some relief of symptoms. Pt denies SOB. No lightheadedness. No ill contacts. Pain rated 9/10. Pt has history of DVT's. Pt currently taking Coumadin.

## 2013-11-11 NOTE — Discharge Instructions (Signed)
Dear [name]:    I appreciate your choosing the [facility] for your healthcare needs, and hope your visit today was EXCELLENT.    Instructions:  Please follow-up with Dr. Leonette Most, your primary care doctor, on Monday.    Please keep your dialysis appointment as scheduled tomorrow.     Return to the Emergency Department for any worsening symptoms or concerns.    Below is some information that our patients often find helpful.    We wish you good health and please do not hesitate to contact us if we can ever be of any assistance.    Sincerely,  [place]    ________________________________________________________________    If you do not continue to improve or your condition worsens, please contact your doctor or return immediately to the Emergency Department.    Thank you for choosing [facility] for your emergency care needs.  We strive to provide EXCELLENT care to you and your family.      DOCTOR REFERRALS  Call [phone] if you need any further referrals and we can help you find a primary care doctor or specialist.  Also, available online at:  [email or web address]    YOUR CONTACT INFORMATION  Before leaving please check with registration to make sure we have an up-to-date contact number.  You can call registration at [phone] to update your information.  For questions about your hospital bill, please call [phone].  For questions about your Emergency Dept Physician bill please call [phone].      [place]  If you need help with health or social services, please call 2-1-1 for a free referral to resources in your area.  2-1-1 is a free service connecting people with information on health insurance, free clinics, pregnancy, mental health, dental care, food assistance, housing, and substance abuse counseling.  Also, available online at:   http://www.[facility].org    MEDICAL RECORDS AND TESTS  Certain laboratory test results do not come back the same day, for example urine cultures.   We will contact you if other important findings are noted.  Radiology films are often reviewed again to ensure accuracy.  If there is any discrepancy, we will notify you.      Please call [phone] to pick up a complimentary CD of any radiology studies performed.  If you or your doctor would like to request a copy of your medical records, please call [phone].      ORTHOPEDIC INJURY   Please know that significant injuries can exist even when an initial x-ray is read as normal or negative.  This can occur because some fractures (broken bones) are not initially visible on x-rays.  For this reason, close outpatient follow-up with your primary care doctor or bone specialist (orthopedist) is required.    MEDICATIONS AND FOLLOWUP  Please be aware that some prescription medications can cause drowsiness.  Use caution when driving or operating machinery.    The examination and treatment you have received in our Emergency Department is provided on an emergency basis, and is not intended to be a substitute for your primary care physician.  It is important that your doctor checks you again and that you report any new or remaining problems at that time.      [place], [place], [place] [address] (1.4 miles, 7 minutes)  [facility] [place],  [place], [place] [address] (6.5 miles, 13 minutes)  Handout with directions available on request

## 2013-11-11 NOTE — ED Provider Notes (Signed)
Physician/Midlevel provider first contact with patient: 11/11/13 1635         EMERGENCY DEPARTMENT HISTORY AND PHYSICAL EXAM    Date: 11/11/2013  Patient Name: Mary [name],Mary [name]  Attending Physician: Alexander-Nicholas D. Valoria Tamburri, MD      History of Presenting Illness     Chief Complaint:   Chief Complaint   Patient presents with   . Chest Pain       Historian:  Patient    67 y.o. female h/o pleural effusion, viral pericarditis, end stage renal disease, DM, HTN, left lower extremity DVT on Coumadin, presents for evaluation due to persistent right sided CP that started approximately 5 days PTA. Patient was seen at [facility] ED 4 days ago and had a chest XR done showing scarring vs fluid on the right side. There was no CHF or pleural fluid. Patient notes chronically having bilateral leg swelling with no recent change. Patient is currently a dialysis patient, receiving every Tuesday, Thursday, and Saturday. Patient was admitted here in 07/2013 for right sided CP and in the hospital for 12 days. CP at that time was thought to be secondary to fluid overload due to end stage renal disease and diastolic CHF. Patient underwent a thoracentesis on 08/15/13 for bilateral pleural effusions. Patient is still currently on Coumadin and has her INR checked every week at dialysis. Denies any fever, cough, SOB, or recent extended travel.     PMD:  Alric Quan, MD    Nursing records reviewed and agree: Yes    Review of Systems     Constitutional:  No fever  CV:  As per HPI  Resp:  No SOB or cough  MS:  +chronic leg swelling, No leg pain  All other systems reviewed and negative    Past Medical History     Past Medical History   Diagnosis Date   . Diabetes mellitus without complication    . Hyperlipidemia    . Hypertensive disorder    . Chronic kidney disease      dialysis t-th-sat   . Arthritis    . Cataracts, bilateral    . GERD (gastroesophageal reflux disease)    . Acute deep vein thrombosis (DVT) of distal vein of left  lower extremity    . Hemodialysis access site with mature fistula        Past Surgical History     Past Surgical History   Procedure Date   . Av fistula placement 2012       Family History     Family History   Problem Relation Age of Onset   . Diabetes Mother    . Cancer Father      throat       Social History     History     Social History   . Marital Status: Widowed     Spouse Name: N/A     Number of Children: N/A   . Years of Education: N/A     Social History Main Topics   . Smoking status: Never Smoker    . Smokeless tobacco: Not on file   . Alcohol Use: No   . Drug Use: No   . Sexually Active:      Other Topics Concern   . Not on file     Social History Narrative   . No narrative on file       Allergies     Allergies   Allergen Reactions   . Penicillins Hives   .  Shrimp (Shellfish Allergy) Swelling       Home Medications     Home medications reviewed by ED MD at 4:35 PM     Previous Medications    ACETAMINOPHEN (TYLENOL) 500 MG TABLET    Take 1 tablet (500 mg total) by mouth every 12 (twelve) hours as needed.    ASPIRIN 81 MG TABLET    Take 81 mg by mouth daily.      ATORVASTATIN (LIPITOR) 10 MG TABLET    Take 10 mg by mouth daily.      B COMPLEX-VITAMIN C-FOLIC ACID (NEPHRO-VITE) 0.8 MG TABS    Take 0.8 mg by mouth.    BISACODYL (DULCOLAX) 5 MG EC TABLET    Take 5 mg by mouth daily as needed.      COLCHICINE 0.6 MG TABLET    Take 1 tablet (0.6 mg total) by mouth daily.    CYCLOBENZAPRINE (FLEXERIL) 10 MG TABLET    Take 10 mg by mouth 3 (three) times daily as needed.    ESOMEPRAZOLE (NEXIUM) 40 MG CAPSULE    Take 40 mg by mouth every morning before breakfast.      GABAPENTIN (NEURONTIN) 300 MG CAPSULE    Take 300 mg by mouth 3 (three) times daily.    INSULIN ASPART (NOVOLOG) 100 UNIT/ML INJECTION    Inject 1-10 Units into the skin 3 (three) times daily before meals.      INSULIN GLARGINE (LANTUS) 100 UNIT/ML INJECTION    Inject 10-20 Units into the skin nightly.      OXYCODONE-ACETAMINOPHEN (PERCOCET) 5-325 MG  PER TABLET    Take 2 tablets by mouth every 6 (six) hours as needed.    PREGABALIN (LYRICA) 75 MG CAPSULE    Take 75 mg by mouth 2 (two) times daily.      TRAMADOL (ULTRAM) 50 MG TABLET    Take 1 tablet (50 mg total) by mouth every 6 (six) hours as needed for Pain.    WARFARIN (COUMADIN) 7.5 MG TABLET    Take 7.5 mg by mouth daily.       VS     Patient Vitals for the past 24 hrs:   BP Temp Pulse Resp SpO2 Height Weight   11/11/13 1823 133/65 mmHg - 82  16  97 % - -   11/11/13 1744 135/67 mmHg - 88  18  95 % - -   11/11/13 1610 134/67 mmHg 98 F (36.7 C) 97  16  98 % 1.6 m 90.266 kg       Physical Exam     Constitutional: Vital signs reviewed. Well appearing.  Head: Normocephalic, atraumatic  Eyes: Conjunctiva and sclera are normal.  No injection or discharge.  Ears, Nose, Throat:  Normal external examination of the nose and ears.    Neck: Normal range of motion. Trachea midline.  Respiratory/Chest: Clear to auscultation. No respiratory distress.   Cardiovascular: Regular, borderline tachy. No murmurs.   Abdomen: Soft and non-tender. No guarding. No masses.  No rebound or guarding.  Back:    Upper Extremity:  No edema. No cyanosis.  Lower Extremity:  1+ pitting edema. No calf TTP, neg Homans,  Neurological:  Alert and conversant.  No focal motor deficits by observation. Speech normal.  Skin: Warm and dry. No rash.  Psychiatric:  Normal affect.  Normal insight.    ED Medications Administered     ED Medication Orders     None  Orders Placed During This Encounter     Orders Placed This Encounter   Procedures   . Chest AP Portable   . Basic Metabolic Panel   . CBC and differential   . Creatine Kinase (CK) with MB if indicated   . Troponin I   . GFR   . PT/ APTT   . CK-MB   . ED Unit Sec Comm Order   . ECG 12 lead   . Saline lock IV       Diagnostic Study Results     ECG (interpreted by ED physician):  NSR, Poor R wave progression, Low voltage in the lateral precordial leads, Left axis deviation, No acute  ischemia, No ectopy, Rate at 86 bpm.     Cardiac Monitor (interpreted by ED physician):  Sinus rhythm, Rate at 86 bpm, No ectopy.     Labs     Results     Procedure Component Value Units Date/Time    CK-MB [id number] Collected:11/11/13 1713     Creatinine Kinase MB (CKMB) 1.8 ng/mL Updated:11/11/13 1802    Troponin I [id number] Collected:11/11/13 1713    Specimen Information:Blood Updated:11/11/13 1749     Troponin I 0.01 ng/mL     GFR [id number] Collected:11/11/13 1713     EGFR 8.9 Updated:11/11/13 [id number]    Basic Metabolic Panel [id number]  (Abnormal) Collected:11/11/13 1713    Specimen Information:Blood Updated:11/11/13 1746     Glucose 174 (H) mg/dL      BUN 47 (H) mg/dL      Creatinine 5.7 (H) mg/dL      CALCIUM 9.5 mg/dL      Sodium 141 mEq/L      Potassium 4.5 mEq/L      Chloride 101 mEq/L      CO2 26 mEq/L      Anion Gap 14.0     Creatine Kinase (CK) with MB if indicated [id number]  (Abnormal) Collected:11/11/13 1713    Specimen Information:Blood Updated:11/11/13 1746     Creatine Kinase (CK) 255 (H) U/L     PT/ APTT [id number]  (Abnormal) Collected:11/11/13 1713     PT 23.0 (H) Updated:11/11/13 1731     PT INR 2.1 (H)      PT Anticoag. Given Within 48 hrs. warfarin ([place]      PTT 36     CBC and differential [[id number]]  (Abnormal) Collected:11/11/13 1713    Specimen Information:Blood / Blood Updated:11/11/13 1722     WBC 6.19      RBC 3.86 (L)      Hgb 11.5 (L) [name]/dL      Hematocrit 34.8 (L) %      MCV 90.2 fL      MCH 29.8 pg      MCHC 33.0 [name]/dL      RDW 17 (H) %      Platelets 183      MPV 11.7 fL      Neutrophils 44 %      Lymphocytes Automated 40 %      Monocytes 12 %      Eosinophils Automated 5 %      Basophils Automated 0 %      Immature Granulocyte 0 %      Nucleated RBC 0      Neutrophils Absolute 2.70      Abs Lymph Automated 2.46      Abs Mono Automated 0.71      Abs Eos Automated 0.29      Absolute Baso Automated 0.03  Absolute Immature Granulocyte 0.00           Radiologic  Studies  Radiology Results (24 Hour)     Procedure Component Value Units Date/Time    Chest AP Portable [[id number]] Collected:11/11/13 1730    Order Status:Completed  Updated:11/11/13 1735    Narrative:    Clinical History:  Chest pain.    COMPARISON: 10/25/2013 CT.    AP view of the chest demonstrate that the lungs are clear. The  cardiomediastinal silhouette, pulmonary vasculature, and osseous  structures are unremarkable.      Impression:     No acute process noted    Arlyss Queen, MD   11/11/2013 5:31 PM      .    Data Review     Laboratory results reviewed by ED provider:  yes  Radiologic study results reviewed by ED provider:  yes    Scribe and MD Attestations     I, Alexander-Nicholas D. Jamala Kohen, MD, personally performed the services documented.  Jinho Heath Lark is scribing for me on Mcchesney,Sybella [name]. I reviewed and confirm the accuracy of the information in this medical record.     I, Toribio Harbour, am serving as a scribe to document services personally performed by Alexander-Nicholas D. Freda Jaquith, MD, based on the provider's statements to me.     MDM and Clinical Notes     Re-evaluation: performed @ 1810.   Based on the patient's clinical presentation, vital signs, and diagnostic studies, I feel the patient is safe for discharge. I considered (in part and not exclusively): Pulmonary Embolism. The patient/family understands their instructions and I am comfortable that the patient will be able to follow up as an outpatient in a timely fashion.    Doubt VTE/PE, pt therapeutic INR.  In past, pt with fluid overload causing atypical chest pain. Pt scheduled for HD tomorrow.  D/w PCP, Selena Lesser who agrees.    Consults:   @1813 : D/w Dr. Leonette Most (patient's PMD) who agrees with outpatient follow up and that the patient have dialysis tomorrow.     Critical Care     na    Diagnosis and Disposition   Rendering Provider: Alexander-Nicholas D. Mansoor Hillyard, MD    Clinical Impression(s):  1. Right-sided chest wall pain     2. Chronic kidney disease, unspecified stage        Disposition  ED Disposition     Discharge Riley Nearing discharge to home/self care.    Condition at disposition: Stable            Prescriptions  New Prescriptions    No medications on file             Lovell Sheehan, MD  11/11/13 2224

## 2013-11-12 LAB — ECG 12-LEAD
Atrial Rate: 86 {beats}/min
P Axis: 52 degrees
P-R Interval: 148 ms
Q-T Interval: 358 ms
QRS Duration: 86 ms
QTC Calculation (Bezet): 428 ms
R Axis: -20 degrees
T Axis: 29 degrees
Ventricular Rate: 86 {beats}/min

## 2013-12-14 ENCOUNTER — Emergency Department: Payer: Medicare Other

## 2013-12-14 ENCOUNTER — Emergency Department
Admission: EM | Admit: 2013-12-14 | Discharge: 2013-12-14 | Disposition: A | Discharge status: Home / Self Care | Payer: Medicare Other | Attending: Emergency Medicine | Admitting: Emergency Medicine

## 2013-12-14 DIAGNOSIS — K219 Gastro-esophageal reflux disease without esophagitis: Secondary | ICD-10-CM | POA: Insufficient documentation

## 2013-12-14 DIAGNOSIS — Z794 Long term (current) use of insulin: Secondary | ICD-10-CM | POA: Insufficient documentation

## 2013-12-14 DIAGNOSIS — Z7901 Long term (current) use of anticoagulants: Secondary | ICD-10-CM | POA: Insufficient documentation

## 2013-12-14 DIAGNOSIS — M129 Arthropathy, unspecified: Secondary | ICD-10-CM | POA: Insufficient documentation

## 2013-12-14 DIAGNOSIS — Z86718 Personal history of other venous thrombosis and embolism: Secondary | ICD-10-CM | POA: Insufficient documentation

## 2013-12-14 DIAGNOSIS — H269 Unspecified cataract: Secondary | ICD-10-CM | POA: Insufficient documentation

## 2013-12-14 DIAGNOSIS — Z992 Dependence on renal dialysis: Secondary | ICD-10-CM | POA: Insufficient documentation

## 2013-12-14 DIAGNOSIS — E119 Type 2 diabetes mellitus without complications: Secondary | ICD-10-CM | POA: Insufficient documentation

## 2013-12-14 DIAGNOSIS — G589 Mononeuropathy, unspecified: Secondary | ICD-10-CM | POA: Insufficient documentation

## 2013-12-14 DIAGNOSIS — I129 Hypertensive chronic kidney disease with stage 1 through stage 4 chronic kidney disease, or unspecified chronic kidney disease: Secondary | ICD-10-CM | POA: Insufficient documentation

## 2013-12-14 DIAGNOSIS — N189 Chronic kidney disease, unspecified: Secondary | ICD-10-CM | POA: Insufficient documentation

## 2013-12-14 DIAGNOSIS — Z7982 Long term (current) use of aspirin: Secondary | ICD-10-CM | POA: Insufficient documentation

## 2013-12-14 DIAGNOSIS — E785 Hyperlipidemia, unspecified: Secondary | ICD-10-CM | POA: Insufficient documentation

## 2013-12-14 LAB — BASIC METABOLIC PANEL
Anion Gap: 14 (ref 5.0–15.0)
BUN: 25 mg/dL — ABNORMAL HIGH (ref 7–19)
CO2: 29 mEq/L (ref 22–29)
Calcium: 9.5 mg/dL (ref 8.5–10.5)
Chloride: 98 mEq/L (ref 98–107)
Creatinine: 4 mg/dL — ABNORMAL HIGH (ref 0.6–1.0)
Glucose: 136 mg/dL — ABNORMAL HIGH (ref 70–100)
Potassium: 4.2 mEq/L (ref 3.5–5.1)
Sodium: 141 mEq/L (ref 136–145)

## 2013-12-14 LAB — MAGNESIUM: Magnesium: 1.9 mg/dL (ref 1.6–2.6)

## 2013-12-14 LAB — GFR: EGFR: 13.4

## 2013-12-14 LAB — PHOSPHORUS: Phosphorus: 3.2 mg/dL (ref 2.3–4.7)

## 2013-12-14 MED ORDER — OXYCODONE-ACETAMINOPHEN 5-325 MG PO TABS
2.0000 | ORAL_TABLET | Freq: Once | ORAL | Status: AC
Start: 2013-12-14 — End: 2013-12-14
  Administered 2013-12-14: 2 via ORAL
  Filled 2013-12-14: qty 2

## 2013-12-14 MED ORDER — OXYCODONE-ACETAMINOPHEN 5-325 MG PO TABS
ORAL_TABLET | ORAL | Status: DC
Start: 2013-12-14 — End: 2018-04-08

## 2013-12-14 NOTE — ED Notes (Signed)
Dr. Selinda Flavin aware CBC clotted & unable to obtain more blood because pt. Is a hard stick. Dr. Selinda Flavin states ok for d/c

## 2013-12-14 NOTE — ED Notes (Signed)
Given warm moist towel to wrap and rub foot rather than scratching (pt is diabetic and on coumadin)

## 2013-12-14 NOTE — ED Notes (Signed)
Very difficult stick with multiple attempts by multiple staff.  Pedi tubes obtained from base of thumb vein with much difficulty and sent to lab.  Priority is electrolytes per Dr. Selinda Flavin if lab is adequate to run and lab notified.

## 2013-12-14 NOTE — ED Provider Notes (Signed)
Physician/Midlevel provider first contact with patient: 12/14/13 1715         History     Chief Complaint   Patient presents with   . Foot Pain     HPI  Patient is a 67 year old female with a history of bilateral foot pain. No calf pain. History of dialysis. He was dialyzed earlier today. States pain is not controlled with her current medications. No evidence of cellulitis.  Past Medical History   Diagnosis Date   . Diabetes mellitus without complication    . Hyperlipidemia    . Hypertensive disorder    . Chronic kidney disease      dialysis t-th-sat   . Arthritis    . Cataracts, bilateral    . GERD (gastroesophageal reflux disease)    . Acute deep vein thrombosis (DVT) of distal vein of left lower extremity    . Hemodialysis access site with mature fistula        Past Surgical History   Procedure Date   . Av fistula placement 2012       Family History   Problem Relation Age of Onset   . Diabetes Mother    . Cancer Father      throat       Social  History   Substance Use Topics   . Smoking status: Never Smoker    . Smokeless tobacco: Not on file   . Alcohol Use: No       .     Allergies   Allergen Reactions   . Penicillins Hives   . Shrimp (Shellfish Allergy) Swelling       Current/Home Medications    ACETAMINOPHEN (TYLENOL) 500 MG TABLET    Take 1 tablet (500 mg total) by mouth every 12 (twelve) hours as needed.    AMLODIPINE (NORVASC) 2.5 MG TABLET    Take 2.5 mg by mouth daily.    ASPIRIN 81 MG TABLET    Take 81 mg by mouth daily.      ATORVASTATIN (LIPITOR) 10 MG TABLET    Take 10 mg by mouth daily.      B COMPLEX-VITAMIN C-FOLIC ACID (NEPHRO-VITE) 0.8 MG TABS    Take 0.8 mg by mouth.    BISACODYL (DULCOLAX) 5 MG EC TABLET    Take 5 mg by mouth daily as needed.      CALCIUM ACETATE (PHOSLO) 667 MG CAPSULE    Take 667 mg by mouth 3 (three) times daily with meals.    COLCHICINE 0.6 MG TABLET    Take 1 tablet (0.6 mg total) by mouth daily.    CYCLOBENZAPRINE (FLEXERIL) 10 MG TABLET    Take 10 mg by mouth 3 (three)  times daily as needed.    DIPHENHYDRAMINE (BENADRYL) 25 MG TABLET    Take 25 mg by mouth every 6 (six) hours as needed.    ESOMEPRAZOLE (NEXIUM) 40 MG CAPSULE    Take 40 mg by mouth every morning before breakfast.      GABAPENTIN (NEURONTIN) 300 MG CAPSULE    Take 300 mg by mouth 3 (three) times daily.    INSULIN ASPART (NOVOLOG) 100 UNIT/ML INJECTION    Inject 1-10 Units into the skin 3 (three) times daily before meals.      INSULIN GLARGINE (LANTUS) 100 UNIT/ML INJECTION    Inject 10-20 Units into the skin nightly.      OXYCODONE-ACETAMINOPHEN (PERCOCET) 5-325 MG PER TABLET    Take 2 tablets by mouth every 6 (six)  hours as needed.    PREGABALIN (LYRICA) 75 MG CAPSULE    Take 75 mg by mouth 2 (two) times daily.      TRAMADOL (ULTRAM) 50 MG TABLET    Take 1 tablet (50 mg total) by mouth every 6 (six) hours as needed for Pain.    WARFARIN (COUMADIN) 7.5 MG TABLET    Take 7.5 mg by mouth daily.        Review of Systems  All reviewed and negative  Physical Exam    BP: 164/79 mmHg, Heart Rate: 97 , Temp: 98.2 F (36.8 C), Resp Rate: 18 , SpO2: 96 %, Weight: 89.359 kg    Physical Exam  CONSTITUTIONAL Patient is afebrile, Vital signs reviewed, Patient appears comfortable, Alert and oriented X 3.  HEAD Atraumatic, Normocephalic.  EYES Eyes are normal to inspection, PERRL, No discharge from eyes,  Extraocular muscles intact, Sclera are normal, Conjunctiva are normal.  ENT Posterior pharynx normal, Mouth normal to inspection.  NECK Normal ROM, No meningeal signs, Cervical spine nontender.  RESPIRATORY CHEST Breath sounds normal, No respiratory distress.  CARDIOVASCULAR   RRR, No murmurs, Normal S1 S2, No rub.  ABDOMEN Abdomen is nontender, No distension.  UPPER EXTREMITY AV fistula left forearm. That has palpable thrill.   LOWER EXTREMITY Inspection normal, Normal range of motion. Pedal pulses 2+ no open lesions. No calf tenderness  NEURO cranial nerves intact motor normal.  SKIN Skin is warm. Skin is dry.   PSYCHIATRIC  Oriented X 3, Normal affect.      ED Medication Orders      Start     Status Ordering Provider    12/14/13 1723   oxyCODONE-acetaminophen (PERCOCET) 5-325 MG per tablet 2 tablet   Once      Route: Oral  Ordered Dose: 2 tablet         Last MAR action:  Given Maeola Sarah E                 MDM      Procedures    Clinical Impression & Disposition     Clinical Impression  Final diagnoses:   Neuropathy        ED Disposition     Discharge Riley Nearing discharge to home/self care.    Condition at disposition: Stable             New Prescriptions    OXYCODONE-ACETAMINOPHEN (PERCOCET) 5-325 MG PER TABLET    1-2 tablets by mouth every 4-6 hours as needed for pain;  Do not drive or operate machinery while taking this medicine                 Evalee Mutton, MD  12/14/13 2105

## 2013-12-14 NOTE — Discharge Instructions (Signed)
Dear Mary [name]:    I appreciate your choosing the [facility] for your healthcare needs, and hope your visit today was EXCELLENT.    Instructions:  Please follow-up with Dr. Leonette Most on Monday.    Return to the Emergency Department for any worsening symptoms or concerns.    Below is some information that our patients often find helpful.    We wish you good health and please do not hesitate to contact us if we can ever be of any assistance.    Sincerely,  Evalee Mutton, MD  [facility]    ________________________________________________________________    If you do not continue to improve or your condition worsens, please contact your doctor or return immediately to the Emergency Department.    Thank you for choosing [facility] for your emergency care needs.  We strive to provide EXCELLENT care to you and your family.      DOCTOR REFERRALS  Call [phone] if you need any further referrals and we can help you find a primary care doctor or specialist.  Also, available online at:  [email or web address]    YOUR CONTACT INFORMATION  Before leaving please check with registration to make sure we have an up-to-date contact number.  You can call registration at [phone] to update your information.  For questions about your hospital bill, please call [phone].  For questions about your Emergency Dept Physician bill please call [phone].      [place]  If you need help with health or social services, please call 2-1-1 for a free referral to resources in your area.  2-1-1 is a free service connecting people with information on health insurance, free clinics, pregnancy, mental health, dental care, food assistance, housing, and substance abuse counseling.  Also, available online at:  http://www.[place].org    MEDICAL RECORDS AND TESTS  Certain laboratory test results do not come back the same day, for example  urine cultures.   We will contact you if other important findings are noted.  Radiology films are often reviewed again to ensure accuracy.  If there is any discrepancy, we will notify you.      Please call [phone] to pick up a complimentary CD of any radiology studies performed.  If you or your doctor would like to request a copy of your medical records, please call [phone].      ORTHOPEDIC INJURY   Please know that significant injuries can exist even when an initial x-ray is read as normal or negative.  This can occur because some fractures (broken bones) are not initially visible on x-rays.  For this reason, close outpatient follow-up with your primary care doctor or bone specialist (orthopedist) is required.    MEDICATIONS AND FOLLOWUP  Please be aware that some prescription medications can cause drowsiness.  Use caution when driving or operating machinery.    The examination and treatment you have received in our Emergency Department is provided on an emergency basis, and is not intended to be a substitute for your primary care physician.  It is important that your doctor checks you again and that you report any new or remaining problems at that time.      [place], [place], [place] [address] (1.4 miles, 7 minutes)  [place], [place], [place] [address] (6.5 miles, 13 minutes)  Handout with directions available on request

## 2013-12-14 NOTE — ED Notes (Addendum)
While HD today bilat feet began to feel itchy and painful, given tylenol and icepacks and a lyrica.  Left foot now better but right foot severely pruritic and also painful.  Pt unable to stop scratching bottom of right foot during triage. Pt has peripheral neuropathy and has been taking her gabapentin as directed.  Not other complaints.  Pt has similar episode about 4-5 months ago but not as severe and used a "creme" and it went away

## 2014-02-20 ENCOUNTER — Emergency Department
Admission: EM | Admit: 2014-02-20 | Discharge: 2014-02-20 | Disposition: A | Discharge status: Home / Self Care | Payer: Medicare Other | Attending: Emergency Medicine | Admitting: Emergency Medicine

## 2014-02-20 ENCOUNTER — Emergency Department: Payer: Medicare Other

## 2014-02-20 DIAGNOSIS — Z86718 Personal history of other venous thrombosis and embolism: Secondary | ICD-10-CM | POA: Insufficient documentation

## 2014-02-20 DIAGNOSIS — E119 Type 2 diabetes mellitus without complications: Secondary | ICD-10-CM | POA: Insufficient documentation

## 2014-02-20 DIAGNOSIS — Z794 Long term (current) use of insulin: Secondary | ICD-10-CM | POA: Insufficient documentation

## 2014-02-20 DIAGNOSIS — M129 Arthropathy, unspecified: Secondary | ICD-10-CM | POA: Insufficient documentation

## 2014-02-20 DIAGNOSIS — Z7901 Long term (current) use of anticoagulants: Secondary | ICD-10-CM | POA: Insufficient documentation

## 2014-02-20 DIAGNOSIS — Z7982 Long term (current) use of aspirin: Secondary | ICD-10-CM | POA: Insufficient documentation

## 2014-02-20 DIAGNOSIS — N189 Chronic kidney disease, unspecified: Secondary | ICD-10-CM

## 2014-02-20 DIAGNOSIS — H269 Unspecified cataract: Secondary | ICD-10-CM | POA: Insufficient documentation

## 2014-02-20 DIAGNOSIS — E785 Hyperlipidemia, unspecified: Secondary | ICD-10-CM | POA: Insufficient documentation

## 2014-02-20 DIAGNOSIS — Y832 Surgical operation with anastomosis, bypass or graft as the cause of abnormal reaction of the patient, or of later complication, without mention of misadventure at the time of the procedure: Secondary | ICD-10-CM | POA: Insufficient documentation

## 2014-02-20 DIAGNOSIS — T82898A Other specified complication of vascular prosthetic devices, implants and grafts, initial encounter: Secondary | ICD-10-CM

## 2014-02-20 DIAGNOSIS — I129 Hypertensive chronic kidney disease with stage 1 through stage 4 chronic kidney disease, or unspecified chronic kidney disease: Secondary | ICD-10-CM | POA: Insufficient documentation

## 2014-02-20 DIAGNOSIS — T82868A Thrombosis of vascular prosthetic devices, implants and grafts, initial encounter: Secondary | ICD-10-CM

## 2014-02-20 DIAGNOSIS — K219 Gastro-esophageal reflux disease without esophagitis: Secondary | ICD-10-CM | POA: Insufficient documentation

## 2014-02-20 LAB — BASIC METABOLIC PANEL
Anion Gap: 18 — ABNORMAL HIGH (ref 5.0–15.0)
BUN: 75 mg/dL — ABNORMAL HIGH (ref 7–19)
CO2: 23 mEq/L (ref 22–29)
Calcium: 9.8 mg/dL (ref 8.5–10.5)
Chloride: 100 mEq/L (ref 98–107)
Creatinine: 9.8 mg/dL — ABNORMAL HIGH (ref 0.6–1.0)
Glucose: 194 mg/dL — ABNORMAL HIGH (ref 70–100)
Potassium: 4.5 mEq/L (ref 3.5–5.1)
Sodium: 141 mEq/L (ref 136–145)

## 2014-02-20 LAB — PT/INR
PT INR: 2.4 — ABNORMAL HIGH (ref 0.9–1.1)
PT: 25.5 s — ABNORMAL HIGH (ref 12.6–15.0)

## 2014-02-20 LAB — GFR: EGFR: 4.8

## 2014-02-20 SURGERY — VENOUS THROMBOLYSIS
Site: Elbow | Laterality: Left

## 2014-02-20 NOTE — ED Notes (Signed)
Saturday HD fistula found to be clotted.  Limited HD that day.  Due for HD tomorrow.

## 2014-02-20 NOTE — Discharge Instructions (Signed)
Dear [name]:    Thank you for choosing the [facility] for your healthcare needs.  I hope your visit today was EXCELLENT.    Please see Dr. Alma [name] tomorrow.  He will arrange for the fistula to be declotted.     Below is some information that our patients often find helpful.    We wish you good health and please do not hesitate to contact us if we can ever be of any assistance.    Sincerely,  Mary Ehrich, MD  [name]    ____________________________________________      Thank you for choosing [facility] for your emergency care needs.  We strive to provide EXCELLENT care to you and your family.      If you do not continue to improve or your condition worsens, please contact your doctor or return immediately to the Emergency Department.    ONSITE PHARMACY  Our full service onsite pharmacy is a 2 minute walk from the ER.  Open Mon to Fri from 8 am to 8 pm, Sat and Sun 9 am to 5 pm. Ask an ED staff member for directions.  We accept all major insurances and prices are competitive with major retailers.  Ask your provider to print your prescriptions down to the pharmacy to speed you on your way home.    DOCTOR REFERRALS  Call [phone] (available 24 hours a day, 7 days a week) if you need any further referrals and we can help you find a primary care doctor or specialist.  Also, available online at:  [email or web address]            YOUR CONTACT INFORMATION  Before leaving please check with registration to make sure we have an up-to-date contact number.  You can call registration at [phone] to update your information.  For questions about your hospital bill, please call [phone].  For questions about your Emergency Dept Physician bill please call [phone].      [place]  If you need help with health or social services, please call 2-1-1 for a free referral to resources in your area.  2-1-1 is a free  service connecting people with information on health insurance, free clinics, pregnancy, mental health, dental care, food assistance, housing, and substance abuse counseling.  Also, available online at:  http://www.[facility].org    MEDICAL RECORDS AND TESTS  Certain laboratory test results do not come back the same day, for example urine cultures.   We will contact you if other important findings are noted.  Radiology films are often reviewed again to ensure accuracy.  If there is any discrepancy, we will notify you.      Please call [phone] to pick up a complimentary CD of any radiology studies performed.  If you or your doctor would like to request a copy of your medical records, please call [phone].      ORTHOPEDIC INJURY   Please know that significant injuries can exist even when an initial x-ray is read as normal or negative.  This can occur because some fractures (broken bones) are not initially visible on x-rays.  For this reason, close outpatient follow-up with your primary care doctor or bone specialist (orthopedist) is required.    MEDICATIONS AND FOLLOWUP  Please be aware that some prescription medications can cause drowsiness.  Use caution when driving or operating machinery.    The examination and treatment  you have received in our Emergency Department is provided on an emergency basis, and is not intended to be a substitute for your primary care physician.  It is important that your doctor checks you again and that you report any new or remaining problems at that time.      [place], [place], [place] [address] (1.4 miles, 7 minutes)  [place], [place], [place] [address] (6.5 miles, 13 minutes)  Handout with directions available on request        [facility])  Call to start or finish an application, compare plans, enroll or ask a question.  [place]: [phone]  Web:  Healthcare.gov    Help  Enrolling in [place]  417-325-7895 (TOLL-FREE)  [phone] (TTY)  Web:  Http://www.coverva.org    Local Help Enrolling in the [place]  (306)864-7255 (MAIN)  Email:  health-help'@nvfs'$ .org  Web:  http://lewis-perez.info/  Address:  [address], [id number] [place], [place] [address]

## 2014-02-20 NOTE — ED Notes (Signed)
IR, MD at bedside.

## 2014-02-20 NOTE — ED Provider Notes (Signed)
Physician/Midlevel provider first contact with patient: 02/20/14 Mary [name]    Patient Name: Mary [name], Mary [name]  Encounter Date:  02/20/2014  Attending Physician: Mary Ehrich, MD  Patient DOB:  10/18/1946  MRN:  [id number]  Room:  08/A08    History of Presenting Illness     68 y.o. female h/o DM, hyperlipidemia, HTN, CKD, GERD, DVT, AV fistula placement, dialysis patient, presents for evaluation due to a clotted AV fistula of the left upper extremity that was first noticed 2 days PTA. Reports that she receives dialysis every Tuesday, Wednesday, Thursday, and Saturday, but on her most recent Wednesday dialysis treatment (5 days ago), [name] noticed that her fistula was not functioning properly. States that her fistula continued to malfunction 2 days ago and was only able to receive limited dialysis that day. Patient was told to come here for further evaluation. Patient is due for dialysis tomorrow. Patient had her fistula done by Mary [name].    PMD:  Mary Quan, MD     Past Medical History     Past Medical History   Diagnosis Date   . Diabetes mellitus without complication    . Hyperlipidemia    . Hypertensive disorder    . Chronic kidney disease      dialysis t-th-sat   . Arthritis    . Cataracts, bilateral    . GERD (gastroesophageal reflux disease)    . Acute deep vein thrombosis (DVT) of distal vein of left lower extremity    . Hemodialysis access site with mature fistula        Past Surgical History     Past Surgical History   Procedure Date   . Av fistula placement 2012       Family History     Family History   Problem Relation Age of Onset   . Diabetes Mother    . Cancer Father      throat       Social History     History     Social History   . Marital Status: Widowed     Spouse Name: N/A     Number of Children: N/A   . Years of Education: N/A     Social History Main Topics   . Smoking status: Never Smoker    . Smokeless tobacco: Not on file   . Alcohol Use: No    . Drug Use: No   . Sexually Active:      Other Topics Concern   . Not on file     Social History Narrative   . No narrative on file       Home Medications     Home medications reviewed by ED MD at 3:09 PM     Previous Medications    ACETAMINOPHEN (TYLENOL) 500 MG TABLET    Take 1 tablet (500 mg total) by mouth every 12 (twelve) hours as needed.    AMLODIPINE (NORVASC) 2.5 MG TABLET    Take 2.5 mg by mouth daily.    ASPIRIN 81 MG TABLET    Take 81 mg by mouth daily.      ATORVASTATIN (LIPITOR) 10 MG TABLET    Take 10 mg by mouth daily.      B COMPLEX-VITAMIN C-FOLIC ACID (NEPHRO-VITE) 0.8 MG TABS    Take 0.8 mg by mouth.    BISACODYL (DULCOLAX) 5 MG EC TABLET    Take 5 mg  by mouth daily as needed.      CALCIUM ACETATE (PHOSLO) 667 MG CAPSULE    Take 667 mg by mouth 3 (three) times daily with meals.    COLCHICINE 0.6 MG TABLET    Take 1 tablet (0.6 mg total) by mouth daily.    CYCLOBENZAPRINE (FLEXERIL) 10 MG TABLET    Take 10 mg by mouth 3 (three) times daily as needed.    DIPHENHYDRAMINE (BENADRYL) 25 MG TABLET    Take 25 mg by mouth every 6 (six) hours as needed.    ESOMEPRAZOLE (NEXIUM) 40 MG CAPSULE    Take 40 mg by mouth every morning before breakfast.      GABAPENTIN (NEURONTIN) 300 MG CAPSULE    Take 300 mg by mouth 3 (three) times daily.    INSULIN ASPART (NOVOLOG) 100 UNIT/ML INJECTION    Inject 1-10 Units into the skin 3 (three) times daily before meals.      INSULIN GLARGINE (LANTUS) 100 UNIT/ML INJECTION    Inject 10-20 Units into the skin nightly.      OXYCODONE-ACETAMINOPHEN (PERCOCET) 5-325 MG PER TABLET    Take 2 tablets by mouth every 6 (six) hours as needed.    OXYCODONE-ACETAMINOPHEN (PERCOCET) 5-325 MG PER TABLET    1-2 tablets by mouth every 4-6 hours as needed for pain;  Do not drive or operate machinery while taking this medicine    PREGABALIN (LYRICA) 75 MG CAPSULE    Take 75 mg by mouth 2 (two) times daily.      TRAMADOL (ULTRAM) 50 MG TABLET    Take 1 tablet (50 mg total) by mouth every 6  (six) hours as needed for Pain.    WARFARIN (COUMADIN) 7.5 MG TABLET    Take 7.5 mg by mouth daily.       Review of Systems     Constitutional:  No fever  Eyes: No discharge   ENT: No ST  CV:  No CP   Resp:  No SOB or cough  GI: No abd pain, N, V, D  GU: No dysuria  MS:    Skin: No rash  Neuro:  No HA  Psych:  No behavior changes  All other systems reviewed and negative     Physical [name]     Constitutional: Vital signs reviewed. Well appearing.  Head: Normocephalic, atraumatic  Eyes: Conjunctiva and sclera are normal.  No injection or discharge.  Ears, Nose, Throat:  Normal external examination of the nose and ears.    Neck: Normal range of motion. Trachea midline.  Respiratory/Chest: Clear to auscultation. No respiratory distress.   Cardiovascular: Regular rate and rhythm. No murmurs.   Abdomen:  No rebound or guarding. Soft.  Non-tender.   Upper Extremity:  Left AV fistula is soft. Distally there is thrill and bruit, but overall it appears clotted. No e/o infection.   Lower Extremity:  No edema. No cyanosis.  Skin: Warm and dry. No rash.  Psychiatric:  Normal affect.  Normal insight.    ED Medications Administered     ED Medication Orders     None          Orders Placed During This Encounter     Orders Placed This Encounter   Procedures   . Basic Metabolic Panel (BMP)   . GFR   . Protime-INR   . ED Unit Sec Comm Order   . ED Unit Sec Comm Order   . IR Venous Intervention Case Request  Diagnostic Study Results     The results of the diagnostic studies below were reviewed by the ED provider:    Labs  Results     ** No Results found for the last 24 hours. **          Radiologic Studies  Radiology Results (24 Hour)     ** No Results found for the last 24 hours. **          Scribe and MD Attestations     I, Mary Staff, MD, personally performed the services documented.  Mary [name] is scribing for me on Mary [name],Mary [name]. I reviewed and confirm the accuracy of the information in this medical record.     I,  Mary [name], am serving as a Education administrator to document services personally performed by Mary Staff, MD, based on the provider's statements to me.     Monitors, EKG, Critical Care, and Splints     Cardiac Monitor (interpreted by ED physician):  n/a  EKG (interpreted by ED physician): n/a  Critical Care: n/a  Splint check:  n/a    VS     Patient Vitals for the past 24 hrs:   BP Temp Pulse Resp SpO2 Height Weight   02/20/14 1438 128/79 mmHg 98.2 F (36.8 C) 105  18  97 % 1.6 m 88.451 kg       MDM and Clinical Notes     [id number]: D/w Mary [name] (vascular surgeon). He requests we consult IR.     1535: D/w Dr. Darden Dates (IR) who will consult on the patient in the ED.     Dr. Saddie Benders and Dr Mary [name] evaluated the patient in the ED.  Case dw Dr. Dulce Sellar. X 2.  No availability in the FO IR schedule for 3 days.  Dr. I will arrange outpatient     Diagnosis and Disposition     Clinical Impression  No diagnosis found.    Disposition  ED Disposition     None          Prescriptions     New Prescriptions    No medications on file           Mary Staff, MD  02/22/14 2032

## 2014-05-04 ENCOUNTER — Other Ambulatory Visit: Payer: Self-pay | Admitting: Internal Medicine

## 2014-05-04 DIAGNOSIS — Z1231 Encounter for screening mammogram for malignant neoplasm of breast: Secondary | ICD-10-CM

## 2014-09-22 DIAGNOSIS — M47817 Spondylosis without myelopathy or radiculopathy, lumbosacral region: Secondary | ICD-10-CM | POA: Insufficient documentation

## 2014-10-23 DIAGNOSIS — K8000 Calculus of gallbladder with acute cholecystitis without obstruction: Secondary | ICD-10-CM | POA: Insufficient documentation

## 2015-10-08 ENCOUNTER — Emergency Department (HOSPITAL_COMMUNITY)
Admission: EM | Admit: 2015-10-08 | Discharge: 2015-10-08 | Disposition: A | Discharge status: Home / Self Care | Payer: Medicare Other | Attending: Emergency Medicine | Admitting: Emergency Medicine

## 2015-10-08 ENCOUNTER — Encounter (HOSPITAL_COMMUNITY): Payer: Self-pay | Admitting: *Deleted

## 2015-10-08 DIAGNOSIS — Z880 Allergy status to penicillin: Secondary | ICD-10-CM | POA: Insufficient documentation

## 2015-10-08 DIAGNOSIS — E119 Type 2 diabetes mellitus without complications: Secondary | ICD-10-CM | POA: Diagnosis not present

## 2015-10-08 DIAGNOSIS — M79645 Pain in left finger(s): Secondary | ICD-10-CM

## 2015-10-08 DIAGNOSIS — Z87448 Personal history of other diseases of urinary system: Secondary | ICD-10-CM | POA: Diagnosis not present

## 2015-10-08 HISTORY — DX: Type 2 diabetes mellitus without complications: E11.9

## 2015-10-08 HISTORY — DX: Disorder of kidney and ureter, unspecified: N28.9

## 2015-10-08 MED ORDER — NAPROXEN 500 MG PO TABS: 500.0000 mg | ORAL_TABLET | Freq: Two times a day (BID) | ORAL | Status: AC

## 2015-10-08 NOTE — ED Notes (Signed)
Pt reports L thumb pain x 3 days.  States she normally gets steroid injections in her thumb.  Last injection was 7 mos ago.   Pt reports she is not from here.  Denise any injury

## 2015-10-08 NOTE — Discharge Instructions (Signed)
Heat Therapy  Heat therapy can help ease sore, stiff, injured, and tight muscles and joints. Heat relaxes your muscles, which may help ease your pain.   RISKS AND COMPLICATIONS  If you have any of the following conditions, do not use heat therapy unless your health care provider has approved:   Poor circulation.   Healing wounds or scarred skin in the area being treated.   Diabetes, heart disease, or high blood pressure.   Not being able to feel (numbness) the area being treated.   Unusual swelling of the area being treated.   Active infections.   Blood clots.   Cancer.   Inability to communicate pain. This may include young children and people who have problems with their brain function (dementia).   Pregnancy.  Heat therapy should only be used on old, pre-existing, or long-lasting (chronic) injuries. Do not use heat therapy on new injuries unless directed by your health care provider.  HOW TO USE HEAT THERAPY  There are several different kinds of heat therapy, including:   Moist heat pack.   Warm water bath.   Hot water bottle.   Electric heating pad.   Heated gel pack.   Heated wrap.   Electric heating pad.  Use the heat therapy method suggested by your health care provider. Follow your health care provider's instructions on when and how to use heat therapy.  GENERAL HEAT THERAPY RECOMMENDATIONS   Do not sleep while using heat therapy. Only use heat therapy while you are awake.   Your skin may turn pink while using heat therapy. Do not use heat therapy if your skin turns red.   Do not use heat therapy if you have new pain.   High heat or long exposure to heat can cause burns. Be careful when using heat therapy to avoid burning your skin.   Do not use heat therapy on areas of your skin that are already irritated, such as with a rash or sunburn.  SEEK MEDICAL CARE IF:   You have blisters, redness, swelling, or numbness.   You have new pain.   Your pain is worse.  MAKE SURE  YOU:   Understand these instructions.   Will watch your condition.   Will get help right away if you are not doing well or get worse.     This information is not intended to replace advice given to you by your health care provider. Make sure you discuss any questions you have with your health care provider.     Document Released: 03/01/2012 Document Revised: 12/29/2014 Document Reviewed: 01/31/2014  Elsevier Interactive Patient Education 2016 [employer]

## 2015-10-08 NOTE — ED Provider Notes (Signed)
CSN: [id number]     Arrival date & time 10/08/15  1853 History   By signing my name below, I, Arlan Organ, attest that this documentation has been prepared under the direction and in the presence of Myking Sar PA-C.  Electronically Signed: Arlan Organ, ED Scribe. 10/08/2015. 8:35 PM.   Chief Complaint  Patient presents with  . L thumb pain    The history is provided by the patient. No language interpreter was used.    HPI Comments: Laura Conway is a 69 y.o. female with a PMHx of DM who presents to the Emergency Department complaining of constant, ongoing L thumb pain that is chronic in nature; worsened in last 3 days. Pt states she typically gets steroid injections in her thumb for this concern. Last injections 7 months ago. No other OTC medications or home remedies attempted prior to arrival. Denies any new injury or trauma to thumb. Laura Conway is hoping to establish with an orthopedist in the area. Pt with known allergy to Penicillins.  Past Medical History  Diagnosis Date  . Diabetes mellitus without complication (HCC)   . Renal disorder    Past Surgical History  Procedure Laterality Date  . Appendectomy    . Cholecystectomy     No family history on file. Social History  Substance Use Topics  . Smoking status: Never Smoker   . Smokeless tobacco: None  . Alcohol Use: No   OB History    No data available     Review of Systems  Constitutional: Negative for fever and chills.  Musculoskeletal: Positive for arthralgias.  Neurological: Negative for weakness and numbness.  Psychiatric/Behavioral: Negative for confusion.  All other systems reviewed and are negative.     Allergies  Penicillins  Home Medications   Prior to Admission medications   Not on File   Triage Vitals: BP 126/60 mmHg  Pulse 69  Temp(Src) 98.3 F (36.8 C) (Oral)  Resp 17  SpO2 96%   Physical Exam  Constitutional: She is oriented to person, place, and time. She appears well-developed  and well-nourished.  HENT:  Head: Normocephalic.  Eyes: EOM are normal.  Neck: Normal range of motion.  Pulmonary/Chest: Effort normal.  Abdominal: She exhibits no distension.  Musculoskeletal: Normal range of motion. She exhibits tenderness.  FROM to L thumb without limitation No redness, swelling, or warmth Tenderness to palpation over MCP joint  N/V intact   Neurological: She is alert and oriented to person, place, and time.  Psychiatric: She has a normal mood and affect.  Nursing note and vitals reviewed.   ED Course  Procedures (including critical care time)  DIAGNOSTIC STUDIES: Oxygen Saturation is 96% on RA, Normal by my interpretation.    COORDINATION OF CARE: 8:18 PM-Discussed treatment plan with pt at bedside and pt agreed to plan.     Labs Review Labs Reviewed - No data to display  Imaging Review No results found. I have personally reviewed and evaluated these images and lab results as part of my medical decision-making.   EKG Interpretation None      MDM   Final diagnoses:  None    1. Left thumb pain, chronic  Patient has a history of tendonitis of left thumb, treated with injections by her previous orthopedist in [place]. No acute infection, edema. Referred to orthopedics.   I personally performed the services described in this documentation, which was scribed in my presence. The recorded information has been reviewed and is accurate.  Elpidio AnisShari Domanique Huesman, PA-C 10/08/15 2047  Jerelyn ScottMartha Linker, MD 10/08/15 [phone]

## 2015-12-13 ENCOUNTER — Ambulatory Visit (INDEPENDENT_AMBULATORY_CARE_PROVIDER_SITE_OTHER): Payer: Medicare Other | Admitting: Podiatry

## 2015-12-13 ENCOUNTER — Encounter: Payer: Self-pay | Admitting: Podiatry

## 2015-12-13 VITALS — BP 90/64 | HR 78 | Resp 16

## 2015-12-13 DIAGNOSIS — E1159 Type 2 diabetes mellitus with other circulatory complications: Secondary | ICD-10-CM | POA: Diagnosis not present

## 2015-12-13 DIAGNOSIS — M79675 Pain in left toe(s): Secondary | ICD-10-CM | POA: Diagnosis not present

## 2015-12-13 DIAGNOSIS — M79676 Pain in unspecified toe(s): Secondary | ICD-10-CM

## 2015-12-13 DIAGNOSIS — B351 Tinea unguium: Secondary | ICD-10-CM | POA: Diagnosis not present

## 2015-12-13 NOTE — Progress Notes (Signed)
   Subjective:    Patient ID: Laura Conway, female    DOB: 1946-07-05, 69 y.o.   MRN: [id number]  HPI this patient presents to the office for an evaluation of her diabetic feet. She says her nails have grown thick and long and her sheets putting pressure on her nails cause intense pain. She states that the nails have grown to be more and more painful over time. She presents to the office for an evaluation and treatment of her nails  The patient presents here for diabetic feet exam and B/L toenail trim.  Review of Systems  All other systems reviewed and are negative.      Objective:   Physical Exam GENERAL APPEARANCE: Alert, conversant. Appropriately groomed. No acute distress.  VASCULAR: Pedal pulses are not  [facility]pable at  [facility]  Capillary refill time is immediate to all digits,  Normal temperature gradient.   NEUROLOGIC: sensation is normal to 5.07 monofilament at 5/5 sites bilateral.  Light touch is intact bilateral, Muscle strength normal.  MUSCULOSKELETAL: acceptable muscle strength, tone and stability bilateral.  Intrinsic muscluature intact bilateral.  Rectus appearance of foot and digits noted bilateral.   DERMATOLOGIC: skin color, texture, and turgor are within normal limits.  No preulcerative lesions or ulcers  are seen, no interdigital maceration noted.  No open lesions present.  . No drainage noted.  NAILS  Thick disfigured ingrown toenails both feet.       Assessment & Plan:  Onychomycosis  Diabetes with angiopathy  IE  Debride nails B/L  Laura Conway DPM

## 2016-01-05 ENCOUNTER — Encounter (HOSPITAL_COMMUNITY): Payer: Self-pay

## 2016-01-05 ENCOUNTER — Emergency Department (HOSPITAL_COMMUNITY): Payer: Medicare Other

## 2016-01-05 ENCOUNTER — Emergency Department (HOSPITAL_COMMUNITY)
Admission: EM | Admit: 2016-01-05 | Discharge: 2016-01-05 | Disposition: A | Discharge status: Home / Self Care | Payer: Medicare Other | Attending: Emergency Medicine | Admitting: Emergency Medicine

## 2016-01-05 DIAGNOSIS — R062 Wheezing: Secondary | ICD-10-CM | POA: Diagnosis present

## 2016-01-05 DIAGNOSIS — J40 Bronchitis, not specified as acute or chronic: Secondary | ICD-10-CM

## 2016-01-05 DIAGNOSIS — Z794 Long term (current) use of insulin: Secondary | ICD-10-CM | POA: Diagnosis not present

## 2016-01-05 DIAGNOSIS — Z87448 Personal history of other diseases of urinary system: Secondary | ICD-10-CM | POA: Diagnosis not present

## 2016-01-05 DIAGNOSIS — Z7901 Long term (current) use of anticoagulants: Secondary | ICD-10-CM | POA: Insufficient documentation

## 2016-01-05 DIAGNOSIS — Z79899 Other long term (current) drug therapy: Secondary | ICD-10-CM | POA: Diagnosis not present

## 2016-01-05 DIAGNOSIS — Z880 Allergy status to penicillin: Secondary | ICD-10-CM | POA: Insufficient documentation

## 2016-01-05 DIAGNOSIS — E119 Type 2 diabetes mellitus without complications: Secondary | ICD-10-CM | POA: Insufficient documentation

## 2016-01-05 DIAGNOSIS — Z791 Long term (current) use of non-steroidal anti-inflammatories (NSAID): Secondary | ICD-10-CM | POA: Diagnosis not present

## 2016-01-05 MED ORDER — PREDNISONE 20 MG PO TABS
40.0000 mg | ORAL_TABLET | Freq: Once | ORAL | Status: AC
  Administered 2016-01-05: 40 mg via ORAL
  Filled 2016-01-05: qty 2

## 2016-01-05 MED ORDER — PREDNISONE 20 MG PO TABS: 40.0000 mg | ORAL_TABLET | Freq: Every day | ORAL | Status: AC

## 2016-01-05 MED ORDER — AEROCHAMBER PLUS W/MASK MISC
1.0000 | Freq: Once | Status: AC
  Administered 2016-01-05: 1
  Filled 2016-01-05: qty 1

## 2016-01-05 MED ORDER — ALBUTEROL SULFATE HFA 108 (90 BASE) MCG/ACT IN AERS
2.0000 | INHALATION_SPRAY | Freq: Once | RESPIRATORY_TRACT | Status: AC
  Administered 2016-01-05: 2 via RESPIRATORY_TRACT
  Filled 2016-01-05: qty 6.7

## 2016-01-05 NOTE — ED Provider Notes (Signed)
CSN: [id number]     Arrival date & time 01/05/16  0223 History   First MD Initiated Contact with Patient 01/05/16 [phone]     Chief Complaint  Patient presents with  . URI     (Consider location/radiation/quality/duration/timing/severity/associated sxs/prior Treatment) HPI   70 year old female with cough and "cold symptoms." Symptom onset several days ago. Persistent since then. Cough is occasionally productive for yellowish sputum. Feels congested and has had clear rhinorrhea. Has been coughing throughout the day. This is worse at night. Denies any acute pain. No fevers or chills. No unusual leg pain or swelling.  Past Medical History  Diagnosis Date  . Diabetes mellitus without complication (HCC)   . Renal disorder    Past Surgical History  Procedure Laterality Date  . Appendectomy    . Cholecystectomy     No family history on file. Social History  Substance Use Topics  . Smoking status: Never Smoker   . Smokeless tobacco: None  . Alcohol Use: No   OB History    No data available     Review of Systems  All systems reviewed and negative, other than as noted in HPI.   Allergies  Penicillins  Home Medications   Prior to Admission medications   Medication Sig Start Date End Date Taking? Authorizing Provider  atorvastatin (LIPITOR) 10 MG tablet TK 1 T PO QD 12/02/15   Historical Provider, MD  B Complex-C-Folic Acid (DIALYVITE TABLET) TABS Take by mouth daily at 2 PM.    Historical Provider, MD  calcium acetate (PHOSLO) 667 MG capsule TK 2 CS PO TID WC 11/11/15   Historical Provider, MD  esomeprazole (NEXIUM) 40 MG capsule Take 40 mg by mouth daily at 12 noon.    Historical Provider, MD  gabapentin (NEURONTIN) 300 MG capsule TK 1 C PO TID 11/22/15   Historical Provider, MD  Insulin Pen Needle 32G X 4 MM MISC by Does not apply route.    Historical Provider, MD  LANTUS SOLOSTAR 100 UNIT/ML Solostar Pen INJ 10 UNITS QHS 11/20/15   Historical Provider, MD   lidocaine-prilocaine (EMLA) cream U UTD 12/03/15   Historical Provider, MD  MATZIM LA 360 MG 24 hr tablet TK 1 T PO QD 10/15/15   Historical Provider, MD  naproxen (NAPROSYN) 500 MG tablet Take 1 tablet (500 mg total) by mouth 2 (two) times daily. 10/08/15   Shari Upstill, PA-C  NOVOLOG FLEXPEN 100 UNIT/ML FlexPen INJ 5 UNITS TID UTD 11/20/15   Historical Provider, MD  predniSONE (DELTASONE) 20 MG tablet Take 2 tablets (40 mg total) by mouth daily. 01/05/16   Raeford RazorStephen Clotiel Troop, MD  TRADJENTA 5 MG TABS tablet TK 1 T PO QD 11/19/15   Historical Provider, MD  warfarin (COUMADIN) 5 MG tablet TK 1 TO 2 TS PO QD OR UTD 11/11/15   Historical Provider, MD   BP 109/55 mmHg  Pulse 61  Temp(Src) 97.9 F (36.6 C) (Oral)  Resp 13  SpO2 94% Physical Exam  Constitutional: She appears well-developed and well-nourished. No distress.  HENT:  Head: Normocephalic and atraumatic.  Eyes: Conjunctivae are normal. Right eye exhibits no discharge. Left eye exhibits no discharge.  Neck: Neck supple.  Cardiovascular: Normal rate, regular rhythm and normal heart sounds.  Exam reveals no gallop and no friction rub.   No murmur heard. Pulmonary/Chest: Effort normal. No respiratory distress. She has wheezes.  Mild wheezing on exam. Generally appears comfortable though with no increased work of breathing. Can speak in complete sentences.  Abdominal: Soft. She exhibits no distension. There is no tenderness.  Musculoskeletal: She exhibits no edema or tenderness.  Lower extremities symmetric as compared to each other. No calf tenderness. Negative Homan's. No palpable cords.   Neurological: She is alert.  Skin: Skin is warm and dry.  Psychiatric: She has a normal mood and affect. Her behavior is normal. Thought content normal.  Nursing note and vitals reviewed.   ED Course  Procedures (including critical care time) Labs Review Labs Reviewed - No data to display  Imaging Review Dg Chest 2 View  01/05/2016  CLINICAL  DATA:  70 year old female with cough and congestion and shortness of breath EXAM: CHEST  2 VIEW COMPARISON:  None. FINDINGS: The heart size and mediastinal contours are within normal limits. Both lungs are clear. The visualized skeletal structures are unremarkable. IMPRESSION: No active cardiopulmonary disease. Electronically Signed   By: Elgie Collard M.D.   On: 01/05/2016 03:31   I have personally reviewed and evaluated these images and lab results as part of my medical decision-making.   EKG Interpretation None      MDM   Final diagnoses:  Bronchitis    69yF with cough. Wheezing on exam. No increased WOB. Imaging w/o acute abnormality. Likely viral bronchitis. Course of steroids. PRN albuterol.     Raeford Razor, MD 01/16/16 [phone]

## 2016-01-05 NOTE — ED Notes (Signed)
Pt here for "cold like symptoms" and describes symptoms of coughing up yellow phlegm and runny nose. She states has trouble sleeping due to these symptoms.

## 2016-03-13 ENCOUNTER — Ambulatory Visit: Payer: Medicare Other | Admitting: Podiatry

## 2016-03-19 ENCOUNTER — Encounter: Payer: Self-pay | Admitting: Podiatry

## 2016-03-19 ENCOUNTER — Ambulatory Visit (INDEPENDENT_AMBULATORY_CARE_PROVIDER_SITE_OTHER): Payer: Medicare Other | Admitting: Podiatry

## 2016-03-19 DIAGNOSIS — B351 Tinea unguium: Secondary | ICD-10-CM

## 2016-03-19 DIAGNOSIS — M79676 Pain in unspecified toe(s): Secondary | ICD-10-CM

## 2016-03-19 DIAGNOSIS — M79675 Pain in left toe(s): Secondary | ICD-10-CM | POA: Diagnosis not present

## 2016-03-19 DIAGNOSIS — E1159 Type 2 diabetes mellitus with other circulatory complications: Secondary | ICD-10-CM

## 2016-03-19 NOTE — Progress Notes (Signed)
   Subjective:    Patient ID: Laura Conway, female    DOB: October 12, 1946, 70 y.o.   MRN: [id number]  HPI this patient presents to the office for an evaluation of her  feet. She says her nails have grown thick and long and her sheets putting pressure on her nails cause  pain. She states that the nails have grown to be more and more painful over time. She presents to the office for an evaluation and treatment of her nails  The patient presents here for diabetic feet exam and B/L toenail trim.  Review of Systems  All other systems reviewed and are negative.      Objective:   Physical Exam GENERAL APPEARANCE: Alert, conversant. Appropriately groomed. No acute distress.  VASCULAR: Pedal pulses are not  palpable at  [facility].  Capillary refill time is immediate to all digits,  Normal temperature gradient.   NEUROLOGIC: sensation is normal to 5.07 monofilament at 5/5 sites bilateral.  Light touch is intact bilateral, Muscle strength normal.  MUSCULOSKELETAL: acceptable muscle strength, tone and stability bilateral.  Intrinsic muscluature intact bilateral.  Rectus appearance of foot and digits noted bilateral.   DERMATOLOGIC: skin color, texture, and turgor are within normal limits.  No preulcerative lesions or ulcers  are seen, no interdigital maceration noted.  No open lesions present.  . No drainage noted.  NAILS  Thick disfigured ingrown toenails both feet.       Assessment & Plan:  Onychomycosis  Diabetes with angiopathy  IE  Debride nails B/L  RTC 3 months  Helane GuntherGregory Macel Yearsley DPM

## 2016-03-22 ENCOUNTER — Encounter (HOSPITAL_COMMUNITY): Payer: Self-pay | Admitting: *Deleted

## 2016-03-22 ENCOUNTER — Emergency Department (HOSPITAL_COMMUNITY)
Admission: EM | Admit: 2016-03-22 | Discharge: 2016-03-22 | Disposition: A | Discharge status: Home / Self Care | Payer: Medicare Other | Attending: Physician Assistant | Admitting: Physician Assistant

## 2016-03-22 ENCOUNTER — Emergency Department (HOSPITAL_COMMUNITY): Payer: Medicare Other

## 2016-03-22 DIAGNOSIS — N186 End stage renal disease: Secondary | ICD-10-CM | POA: Insufficient documentation

## 2016-03-22 DIAGNOSIS — Z79899 Other long term (current) drug therapy: Secondary | ICD-10-CM | POA: Insufficient documentation

## 2016-03-22 DIAGNOSIS — J069 Acute upper respiratory infection, unspecified: Secondary | ICD-10-CM | POA: Insufficient documentation

## 2016-03-22 DIAGNOSIS — Z880 Allergy status to penicillin: Secondary | ICD-10-CM | POA: Diagnosis not present

## 2016-03-22 DIAGNOSIS — E119 Type 2 diabetes mellitus without complications: Secondary | ICD-10-CM | POA: Insufficient documentation

## 2016-03-22 DIAGNOSIS — Z992 Dependence on renal dialysis: Secondary | ICD-10-CM | POA: Diagnosis not present

## 2016-03-22 DIAGNOSIS — Z794 Long term (current) use of insulin: Secondary | ICD-10-CM | POA: Insufficient documentation

## 2016-03-22 DIAGNOSIS — Z7952 Long term (current) use of systemic steroids: Secondary | ICD-10-CM | POA: Insufficient documentation

## 2016-03-22 DIAGNOSIS — R05 Cough: Secondary | ICD-10-CM | POA: Diagnosis present

## 2016-03-22 DIAGNOSIS — Z7901 Long term (current) use of anticoagulants: Secondary | ICD-10-CM | POA: Diagnosis not present

## 2016-03-22 DIAGNOSIS — Z791 Long term (current) use of non-steroidal anti-inflammatories (NSAID): Secondary | ICD-10-CM | POA: Insufficient documentation

## 2016-03-22 MED ORDER — GUAIFENESIN 100 MG/5ML PO SOLN
5.0000 mL | Freq: Once | ORAL | Status: AC
  Administered 2016-03-22: 100 mg via ORAL
  Filled 2016-03-22 (×2): qty 5

## 2016-03-22 MED ORDER — ACETAMINOPHEN 325 MG PO TABS
650.0000 mg | ORAL_TABLET | Freq: Once | ORAL | Status: AC
  Administered 2016-03-22: 650 mg via ORAL
  Filled 2016-03-22: qty 2

## 2016-03-22 MED ORDER — GUAIFENESIN 100 MG/5ML PO LIQD: 100.0000 mg | ORAL | Status: AC | PRN

## 2016-03-22 MED ORDER — BENZONATATE 100 MG PO CAPS: 100.0000 mg | ORAL_CAPSULE | Freq: Three times a day (TID) | ORAL | Status: AC | PRN

## 2016-03-22 NOTE — ED Notes (Signed)
Taken to xray at this time. 

## 2016-03-22 NOTE — ED Provider Notes (Signed)
CSN: [id number]     Arrival date & time 03/22/16  1913 History   First MD Initiated Contact with Patient 03/22/16 2017     Chief Complaint  Patient presents with  . URI     (Consider location/radiation/quality/duration/timing/severity/associated sxs/prior Treatment) HPI   Patient is a 70 year old female with history of end-stage renal disease and diabetes. Patient reports stuffy nose and sore throat and occasional cough. Patient afebrile. Patient's been going to dialysis  normally. Patient reports she occasionally coughs up some sputum. No history of COPD.  Past Medical History  Diagnosis Date  . Diabetes mellitus without complication (HCC)   . Renal disorder    Past Surgical History  Procedure Laterality Date  . Appendectomy    . Cholecystectomy     No family history on file. Social History  Substance Use Topics  . Smoking status: Never Smoker   . Smokeless tobacco: None  . Alcohol Use: No   OB History    No data available     Review of Systems  Constitutional: Negative for fever, activity change and fatigue.  HENT: Positive for congestion.   Respiratory: Positive for cough. Negative for shortness of breath.   Cardiovascular: Negative for chest pain.  Gastrointestinal: Negative for abdominal pain.      Allergies  Shellfish allergy and Penicillins  Home Medications   Prior to Admission medications   Medication Sig Start Date End Date Taking? Authorizing Provider  atorvastatin (LIPITOR) 10 MG tablet TAKES 1 TAB BY MOUTH ONCE DAILY IN MORNING 12/02/15  Yes Historical Provider, MD  B Complex-C-Folic Acid (DIALYVITE TABLET) TABS Take 1 tablet by mouth daily.    Yes Historical Provider, MD  calcium acetate (PHOSLO) 667 MG capsule TAKES 2 CAPSULES BY MOUTH 3 TIMES DAILY WITH MEALS 11/11/15  Yes Historical Provider, MD  esomeprazole (NEXIUM) 40 MG capsule Take 40 mg by mouth daily.    Yes Historical Provider, MD  gabapentin (NEURONTIN) 300 MG capsule TAKES 1 CAPSULE BY  MOUTH TWICE DAILY 11/22/15  Yes Historical Provider, MD  HUMALOG KWIKPEN 100 UNIT/ML KiwkPen Inject 5 Units into the skin 3 (three) times daily with meals. 01/25/16  Yes Historical Provider, MD  LANTUS SOLOSTAR 100 UNIT/ML Solostar Pen TAKES 10 UNITS SUBCUTANEOUS AT BEDTIME 11/20/15  Yes Historical Provider, MD  MATZIM LA 360 MG 24 hr tablet TAKES 1 TAB BY MOUTH ONCE DAILY 10/15/15  Yes Historical Provider, MD  NOVOLOG FLEXPEN 100 UNIT/ML FlexPen TAKES 5 UNITS SUBCUTANEOUS 3 TIMES DAILY WITH EACH MEAL 11/20/15  Yes Historical Provider, MD  TRADJENTA 5 MG TABS tablet TAKES 1 TAB BY MOUTH ONCE DAILY 11/19/15  Yes Historical Provider, MD  warfarin (COUMADIN) 5 MG tablet TAKES  BY MOUTH ON MON ONLY, TAKES  ALL OTHER DAYS (IN EVENINGS) 11/11/15  Yes Historical Provider, MD  benzonatate (TESSALON PERLES) 100 MG capsule Take 1 capsule (100 mg total) by mouth 3 (three) times daily as needed for cough. 03/22/16   Congetta Odriscoll Lyn Jazper Nikolai, MD  guaiFENesin (ROBITUSSIN) 100 MG/5ML liquid Take 5-10 mLs (100-200 mg total) by mouth every 4 (four) hours as needed for cough. 03/22/16   Jaren Kearn Lyn Arieh Bogue, MD  naproxen (NAPROSYN) 500 MG tablet Take 1 tablet (500 mg total) by mouth 2 (two) times daily. 10/08/15   Elpidio Anis, PA-C  predniSONE (DELTASONE) 20 MG tablet Take 2 tablets (40 mg total) by mouth daily. 01/05/16   Raeford Razor, MD   BP 115/58 mmHg  Pulse 63  Temp(Src) 98.5 F (36.9 C) (Oral)  Resp 24  Ht 5\' 3"  (1.6 m)  Wt 192 lb 2 oz (87.147 kg)  BMI 34.04 kg/m2  SpO2 96% Physical Exam  Constitutional: She is oriented to person, place, and time. She appears well-developed and well-nourished.  HENT:  Head: Normocephalic and atraumatic.  Mild erythema to bilateral turbinates. No erythema to posterior pharynx.  Eyes: Right eye exhibits no discharge.  Cardiovascular: Normal rate, regular rhythm and normal heart sounds.   No murmur heard. Pulmonary/Chest: Effort normal and breath sounds normal. She  has no wheezes. She has no rales.  Musculoskeletal:  Fistula left arm.  Neurological: She is oriented to person, place, and time.  Skin: Skin is warm and dry. She is not diaphoretic.  Psychiatric: She has a normal mood and affect.  Nursing note and vitals reviewed.   ED Course  Procedures (including critical care time) Labs Review Labs Reviewed - No data to display  Imaging Review Dg Chest 2 View  03/22/2016  CLINICAL DATA:  Cold symptoms 1 week with productive cough and chest pain. EXAM: CHEST  2 VIEW COMPARISON:  01/05/2016 FINDINGS: Lungs are hypoinflated without focal consolidation or effusion. Cardiomediastinal silhouette is within normal. There is mild calcified plaque over the aortic arch. There are mild degenerative changes of the spine. IMPRESSION: Hypoinflation without acute cardiopulmonary disease. Electronically Signed   By: Elberta Fortisaniel  Boyle M.D.   On: 03/22/2016 22:01   I have personally reviewed and evaluated these images and lab results as part of my medical decision-making.   EKG Interpretation None      MDM   Final diagnoses:  Acute URI    70 year old female presenting with cough and congestion. Patient reports mild stuffy nose. She reports mild cough at night. Patient has normal vital signs normal physical exam. We'll make sure patient does not have pneumonia with a chest x-ray. Otherwise we will treat patient's symptoms and have her return home and follow-up with primary care physician.   Yarimar Lavis Randall AnLyn Dagny Fiorentino, MD 03/22/16 [phone]

## 2016-03-22 NOTE — Discharge Instructions (Signed)
Upper Respiratory Infection, Adult Most upper respiratory infections (URIs) are a viral infection of the air passages leading to the lungs. A URI affects the nose, throat, and upper air passages. The most common type of URI is nasopharyngitis and is typically referred to as "the common cold." URIs run their course and usually go away on their own. Most of the time, a URI does not require medical attention, but sometimes a bacterial infection in the upper airways can follow a viral infection. This is called a secondary infection. Sinus and middle ear infections are common types of secondary upper respiratory infections. Bacterial pneumonia can also complicate a URI. A URI can worsen asthma and chronic obstructive pulmonary disease (COPD). Sometimes, these complications can require emergency medical care and may be life threatening.  CAUSES Almost all URIs are caused by viruses. A virus is a type of germ and can spread from one person to another.  RISKS FACTORS You may be at risk for a URI if:   You smoke.   You have chronic heart or lung disease.  You have a weakened defense (immune) system.   You are very young or very old.   You have nasal allergies or asthma.  You work in crowded or poorly ventilated areas.  You work in health care facilities or schools. SIGNS AND SYMPTOMS  Symptoms typically develop 2-3 days after you come in contact with a cold virus. Most viral URIs last 7-10 days. However, viral URIs from the influenza virus (flu virus) can last 14-18 days and are typically more severe. Symptoms may include:   Runny or stuffy (congested) nose.   Sneezing.   Cough.   Sore throat.   Headache.   Fatigue.   Fever.   Loss of appetite.   Pain in your forehead, behind your eyes, and over your cheekbones (sinus pain).  Muscle aches.  DIAGNOSIS  Your health care provider may diagnose a URI by:  Physical exam.  Tests to check that your symptoms are not due to  another condition such as:  Strep throat.  Sinusitis.  Pneumonia.  Asthma. TREATMENT  A URI goes away on its own with time. It cannot be cured with medicines, but medicines may be prescribed or recommended to relieve symptoms. Medicines may help:  Reduce your fever.  Reduce your cough.  Relieve nasal congestion. HOME CARE INSTRUCTIONS   Take medicines only as directed by your health care provider.   Gargle warm saltwater or take cough drops to comfort your throat as directed by your health care provider.  Use a warm mist humidifier or inhale steam from a shower to increase air moisture. This may make it easier to breathe.  Drink enough fluid to keep your urine clear or pale yellow.   Eat soups and other clear broths and maintain good nutrition.   Rest as needed.   Return to work when your temperature has returned to normal or as your health care provider advises. You may need to stay home longer to avoid infecting others. You can also use a face mask and careful hand washing to prevent spread of the virus.  Increase the usage of your inhaler if you have asthma.   Do not use any tobacco products, including cigarettes, chewing tobacco, or electronic cigarettes. If you need help quitting, ask your health care provider. PREVENTION  The best way to protect yourself from getting a cold is to practice good hygiene.   Avoid oral or hand contact with people with cold   symptoms.   Wash your hands often if contact occurs.  There is no clear evidence that vitamin C, vitamin E, echinacea, or exercise reduces the chance of developing a cold. However, it is always recommended to get plenty of rest, exercise, and practice good nutrition.  SEEK MEDICAL CARE IF:   You are getting worse rather than better.   Your symptoms are not controlled by medicine.   You have chills.  You have worsening shortness of breath.  You have brown or red mucus.  You have yellow or brown nasal  discharge.  You have pain in your face, especially when you bend forward.  You have a fever.  You have swollen neck glands.  You have pain while swallowing.  You have white areas in the back of your throat. SEEK IMMEDIATE MEDICAL CARE IF:   You have severe or persistent:  Headache.  Ear pain.  Sinus pain.  Chest pain.  You have chronic lung disease and any of the following:  Wheezing.  Prolonged cough.  Coughing up blood.  A change in your usual mucus.  You have a stiff neck.  You have changes in your:  Vision.  Hearing.  Thinking.  Mood. MAKE SURE YOU:   Understand these instructions.  Will watch your condition.  Will get help right away if you are not doing well or get worse.   This information is not intended to replace advice given to you by your health care provider. Make sure you discuss any questions you have with your health care provider.   Document Released: 06/03/2001 Document Revised: 04/24/2015 Document Reviewed: 03/15/2014 Elsevier Interactive Patient Education 2016 [employer]  

## 2016-03-22 NOTE — ED Notes (Signed)
Pt is here with complaints of cold symptoms last week, now blowing snot out of nose with some blood in it.  Reports cough with yellow sputum.  No fever, pain in chest when she coughs only.  Pt HD/DM.

## 2016-05-28 ENCOUNTER — Emergency Department (HOSPITAL_COMMUNITY)
Admission: EM | Admit: 2016-05-28 | Discharge: 2016-05-29 | Disposition: A | Discharge status: Home / Self Care | Payer: Medicare Other | Attending: Emergency Medicine | Admitting: Emergency Medicine

## 2016-05-28 ENCOUNTER — Encounter (HOSPITAL_COMMUNITY): Payer: Self-pay | Admitting: *Deleted

## 2016-05-28 DIAGNOSIS — I953 Hypotension of hemodialysis: Secondary | ICD-10-CM | POA: Insufficient documentation

## 2016-05-28 DIAGNOSIS — E785 Hyperlipidemia, unspecified: Secondary | ICD-10-CM | POA: Diagnosis not present

## 2016-05-28 DIAGNOSIS — E1122 Type 2 diabetes mellitus with diabetic chronic kidney disease: Secondary | ICD-10-CM | POA: Insufficient documentation

## 2016-05-28 DIAGNOSIS — Z880 Allergy status to penicillin: Secondary | ICD-10-CM | POA: Insufficient documentation

## 2016-05-28 DIAGNOSIS — I120 Hypertensive chronic kidney disease with stage 5 chronic kidney disease or end stage renal disease: Secondary | ICD-10-CM | POA: Insufficient documentation

## 2016-05-28 DIAGNOSIS — Z79899 Other long term (current) drug therapy: Secondary | ICD-10-CM | POA: Diagnosis not present

## 2016-05-28 DIAGNOSIS — I959 Hypotension, unspecified: Secondary | ICD-10-CM | POA: Diagnosis present

## 2016-05-28 DIAGNOSIS — Z992 Dependence on renal dialysis: Secondary | ICD-10-CM | POA: Diagnosis not present

## 2016-05-28 DIAGNOSIS — Z791 Long term (current) use of non-steroidal anti-inflammatories (NSAID): Secondary | ICD-10-CM | POA: Diagnosis not present

## 2016-05-28 DIAGNOSIS — Z7952 Long term (current) use of systemic steroids: Secondary | ICD-10-CM | POA: Insufficient documentation

## 2016-05-28 DIAGNOSIS — N186 End stage renal disease: Secondary | ICD-10-CM | POA: Diagnosis not present

## 2016-05-28 DIAGNOSIS — Z7901 Long term (current) use of anticoagulants: Secondary | ICD-10-CM | POA: Diagnosis not present

## 2016-05-28 DIAGNOSIS — K219 Gastro-esophageal reflux disease without esophagitis: Secondary | ICD-10-CM | POA: Insufficient documentation

## 2016-05-28 LAB — CBG MONITORING, ED: GLUCOSE-CAPILLARY: 222 mg/dL — AB (ref 65–99)

## 2016-05-28 NOTE — ED Notes (Signed)
The pt has had low bp since yesterday when she finished dialysis. Lt arm fistula

## 2016-05-28 NOTE — ED Provider Notes (Signed)
CSN: [id number]     Arrival date & time 05/28/16  1723 History   First MD Initiated Contact with Patient 05/28/16 2214     Chief Complaint  Patient presents with  . Hypotension     (Consider location/radiation/quality/duration/timing/severity/associated sxs/prior Treatment) HPI Comments: Patient with a history of ESRD-HD, DM, HTN, HLD, GERD presents with complaint of low blood pressure. It started at dialysis yesterday and continued at home. She denies symptoms of lightheadedness, dizziness, near syncope or pain. There is no SOB, nausea or vomiting. No chest pain.   The history is provided by the patient. No language interpreter was used.    Past Medical History  Diagnosis Date  . Diabetes mellitus without complication (HCC)   . Renal disorder    Past Surgical History  Procedure Laterality Date  . Appendectomy    . Cholecystectomy     No family history on file. Social History  Substance Use Topics  . Smoking status: Never Smoker   . Smokeless tobacco: None  . Alcohol Use: No   OB History    No data available     Review of Systems  Constitutional: Negative for fever and chills.  Respiratory: Negative.  Negative for shortness of breath.   Cardiovascular: Negative.  Negative for chest pain, palpitations and leg swelling.  Gastrointestinal: Negative for vomiting, abdominal pain and diarrhea.  Musculoskeletal: Negative.  Negative for myalgias.  Skin: Negative.  Negative for color change.  Neurological: Negative.  Negative for dizziness, syncope, weakness and light-headedness.      Allergies  Shellfish allergy and Penicillins  Home Medications   Prior to Admission medications   Medication Sig Start Date End Date Taking? Authorizing Provider  atorvastatin (LIPITOR) 10 MG tablet TAKES 1 TAB BY MOUTH ONCE DAILY IN MORNING 12/02/15   Historical Provider, MD  B Complex-C-Folic Acid (DIALYVITE TABLET) TABS Take 1 tablet by mouth daily.     Historical Provider, MD   benzonatate (TESSALON PERLES) 100 MG capsule Take 1 capsule (100 mg total) by mouth 3 (three) times daily as needed for cough. 03/22/16   Courteney Lyn Mackuen, MD  calcium acetate (PHOSLO) 667 MG capsule TAKES 2 CAPSULES BY MOUTH 3 TIMES DAILY WITH MEALS 11/11/15   Historical Provider, MD  esomeprazole (NEXIUM) 40 MG capsule Take 40 mg by mouth daily.     Historical Provider, MD  gabapentin (NEURONTIN) 300 MG capsule TAKES 1 CAPSULE BY MOUTH TWICE DAILY 11/22/15   Historical Provider, MD  guaiFENesin (ROBITUSSIN) 100 MG/5ML liquid Take 5-10 mLs (100-200 mg total) by mouth every 4 (four) hours as needed for cough. 03/22/16   Courteney Lyn Mackuen, MD  HUMALOG KWIKPEN 100 UNIT/ML KiwkPen Inject 5 Units into the skin 3 (three) times daily with meals. 01/25/16   Historical Provider, MD  LANTUS SOLOSTAR 100 UNIT/ML Solostar Pen TAKES 10 UNITS SUBCUTANEOUS AT BEDTIME 11/20/15   Historical Provider, MD  MATZIM LA 360 MG 24 hr tablet TAKES 1 TAB BY MOUTH ONCE DAILY 10/15/15   Historical Provider, MD  naproxen (NAPROSYN) 500 MG tablet Take 1 tablet (500 mg total) by mouth 2 (two) times daily. 10/08/15   Takeshi Teasdale, PA-C  NOVOLOG FLEXPEN 100 UNIT/ML FlexPen TAKES 5 UNITS SUBCUTANEOUS 3 TIMES DAILY WITH EACH MEAL 11/20/15   Historical Provider, MD  predniSONE (DELTASONE) 20 MG tablet Take 2 tablets (40 mg total) by mouth daily. 01/05/16   Raeford RazorStephen Kohut, MD  TRADJENTA 5 MG TABS tablet TAKES 1 TAB BY MOUTH ONCE DAILY 11/19/15   Historical  Provider, MD  warfarin (COUMADIN) 5 MG tablet TAKES  BY MOUTH ON MON ONLY, TAKES  ALL OTHER DAYS (IN EVENINGS) 11/11/15   Historical Provider, MD   BP 107/50 mmHg  Pulse 67  Temp(Src) 98.2 F (36.8 C) (Oral)  Resp 21  Ht  (1.6 m)  Wt 89.614 kg  BMI 35.01 kg/m2  SpO2 100% Physical Exam  Constitutional: She is oriented to person, place, and time. She appears well-developed and well-nourished.  HENT:  Head: Normocephalic.  Eyes: Conjunctivae are normal.  Neck:  Normal range of motion. Neck supple.  Cardiovascular: Normal rate and regular rhythm.   Pulmonary/Chest: Effort normal and breath sounds normal. She has no wheezes. She has no rales.  Abdominal: Soft. Bowel sounds are normal. There is no tenderness. There is no rebound and no guarding.  Musculoskeletal: Normal range of motion. She exhibits no edema.  Neurological: She is alert and oriented to person, place, and time.  Skin: Skin is warm and dry. No rash noted.  Psychiatric: She has a normal mood and affect.    ED Course  Procedures (including critical care time) Labs Review Labs Reviewed  CBG MONITORING, ED - Abnormal; Notable for the following:    Glucose-Capillary 222 (*)    All other components within normal limits  I-STAT CHEM 8, ED   Results for orders placed or performed during the hospital encounter of 05/28/16  POC CBG, ED  Result Value Ref Range   Glucose-Capillary 222 (H) 65 - 99 mg/dL    Imaging Review No results found. I have personally reviewed and evaluated these images and lab results as part of my medical decision-making.   EKG Interpretation None      MDM   Final diagnoses:  None    1. Hypotension  Patient presents with concern for low blood pressure, reading of a systolic in the 50's at home. No symptoms, including no lightheadedness, weakness, nausea, confusion, syncope. No orthostatic changes. Chemistries reviewed and are c/w renal disease (Cr 9.7, BUN 44) with normal potassium. She is seen and evaluated by Dr. Elesa Massed and found appropriate for discharge with reassurance. Recommended not taking blood pressure medications until seen by PCP.     Elpidio Anis, PA-C 05/28/16 2359  Layla Maw Ward, DO 05/29/16 0005

## 2016-05-28 NOTE — ED Notes (Signed)
Pt requesting food; states she is diabetic; CBG to be checked

## 2016-05-29 NOTE — Discharge Instructions (Signed)
STOP TAKING YOUR BLOOD PRESSURE MEDICATION (MATZIM) UNTIL SEEN BY YOUR DOCTOR, WHICH SHOULD BE THIS WEEK.    Hypotension As your heart beats, it forces blood through your arteries. This force is your blood pressure. If your blood pressure is too low for you to go about your normal activities or to support the organs of your body, you have hypotension. Hypotension is also referred to as low blood pressure. When your blood pressure becomes too low, you may not get enough blood to your brain. As a result, you may feel weak, feel lightheaded, or develop a rapid heart rate. In a more severe case, you may faint. CAUSES Various conditions can cause hypotension. These include:  Blood loss.  Dehydration.  Heart or endocrine problems.  Pregnancy.  Severe infection.  Not having a well-balanced diet filled with needed nutrients.  Severe allergic reactions (anaphylaxis). Some medicines, such as blood pressure medicine or water pills (diuretics), may lower your blood pressure below normal. Sometimes taking too much medicine or taking medicine not as directed can cause hypotension. TREATMENT  Hospitalization is sometimes required for hypotension if fluid or blood replacement is needed, if time is needed for medicines to wear off, or if further monitoring is needed. Treatment might include changing your diet, changing your medicines (including medicines aimed at raising your blood pressure), and use of support stockings. HOME CARE INSTRUCTIONS   Drink enough fluids to keep your urine clear or pale yellow.  Take your medicines as directed by your health care provider.  Get up slowly from reclining or sitting positions. This gives your blood pressure a chance to adjust.  Wear support stockings as directed by your health care provider.  Maintain a healthy diet by including nutritious food, such as fruits, vegetables, nuts, whole grains, and lean meats. SEEK MEDICAL CARE IF:  You have vomiting or  diarrhea.  You have a fever for more than 2-3 days.  You feel more thirsty than usual.  You feel weak and tired. SEEK IMMEDIATE MEDICAL CARE IF:   You have chest pain or a fast or irregular heartbeat.  You have a loss of feeling in some part of your body, or you lose movement in your arms or legs.  You have trouble speaking.  You become sweaty or feel lightheaded.  You faint. MAKE SURE YOU:   Understand these instructions.  Will watch your condition.  Will get help right away if you are not doing well or get worse.   This information is not intended to replace advice given to you by your health care provider. Make sure you discuss any questions you have with your health care provider.   Document Released: 12/08/2005 Document Revised: 09/28/2013 Document Reviewed: 06/10/2013 Elsevier Interactive Patient Education [employer]2016 Elsevier Inc.

## 2016-06-18 ENCOUNTER — Encounter: Payer: Self-pay | Admitting: Podiatry

## 2016-06-18 ENCOUNTER — Ambulatory Visit (INDEPENDENT_AMBULATORY_CARE_PROVIDER_SITE_OTHER): Payer: Medicare Other | Admitting: Podiatry

## 2016-06-18 DIAGNOSIS — B351 Tinea unguium: Secondary | ICD-10-CM | POA: Diagnosis not present

## 2016-06-18 DIAGNOSIS — E1159 Type 2 diabetes mellitus with other circulatory complications: Secondary | ICD-10-CM | POA: Diagnosis not present

## 2016-06-18 DIAGNOSIS — M79675 Pain in left toe(s): Secondary | ICD-10-CM | POA: Diagnosis not present

## 2016-06-18 DIAGNOSIS — M79676 Pain in unspecified toe(s): Secondary | ICD-10-CM

## 2016-06-18 NOTE — Progress Notes (Signed)
   Subjective:    Patient ID: Laura LoraGladys Conway, female    DOB: [birth year], 70 y.o.   MRN: [id number]  HPI this patient presents to the office for an evaluation of her  feet. She says her nails have grown thick and long and her sheets putting pressure on her nails cause  pain. She states that the nails have grown to be more and more painful over time. She presents to the office for an evaluation and treatment of her nails  The patient presents here for diabetic feet exam and B/L toenail trim.  Review of Systems  All other systems reviewed and are negative.      Objective:   Physical Exam GENERAL APPEARANCE: Alert, conversant. Appropriately groomed. No acute distress.  VASCULAR: Pedal pulses are not  palpable at  [facility].  Capillary refill time is immediate to all digits,  Normal temperature gradient.   NEUROLOGIC: sensation is normal to 5.07 monofilament at 5/5 sites bilateral.  Light touch is intact bilateral, Muscle strength normal.  MUSCULOSKELETAL: acceptable muscle strength, tone and stability bilateral.  Intrinsic muscluature intact bilateral.  Rectus appearance of foot and digits noted bilateral.   DERMATOLOGIC: skin color, texture, and turgor are within normal limits.  No preulcerative lesions or ulcers  are seen, no interdigital maceration noted.  No open lesions present.  . No drainage noted.  NAILS  Thick disfigured ingrown toenails both feet.       Assessment & Plan:  Onychomycosis  Diabetes with angiopathy  IE  Debride nails B/L  RTC 3 months  Helane GuntherGregory Marisa Hufstetler DPM

## 2016-12-18 IMAGING — DX DG CHEST 2V
2 series · 2 of 2 positions shown · non-contrast
Comparison: 01/05/2016

CLINICAL DATA: Cold symptoms 1 week with productive cough and chest
pain.

EXAM:
CHEST  2 VIEW

[chest pa]
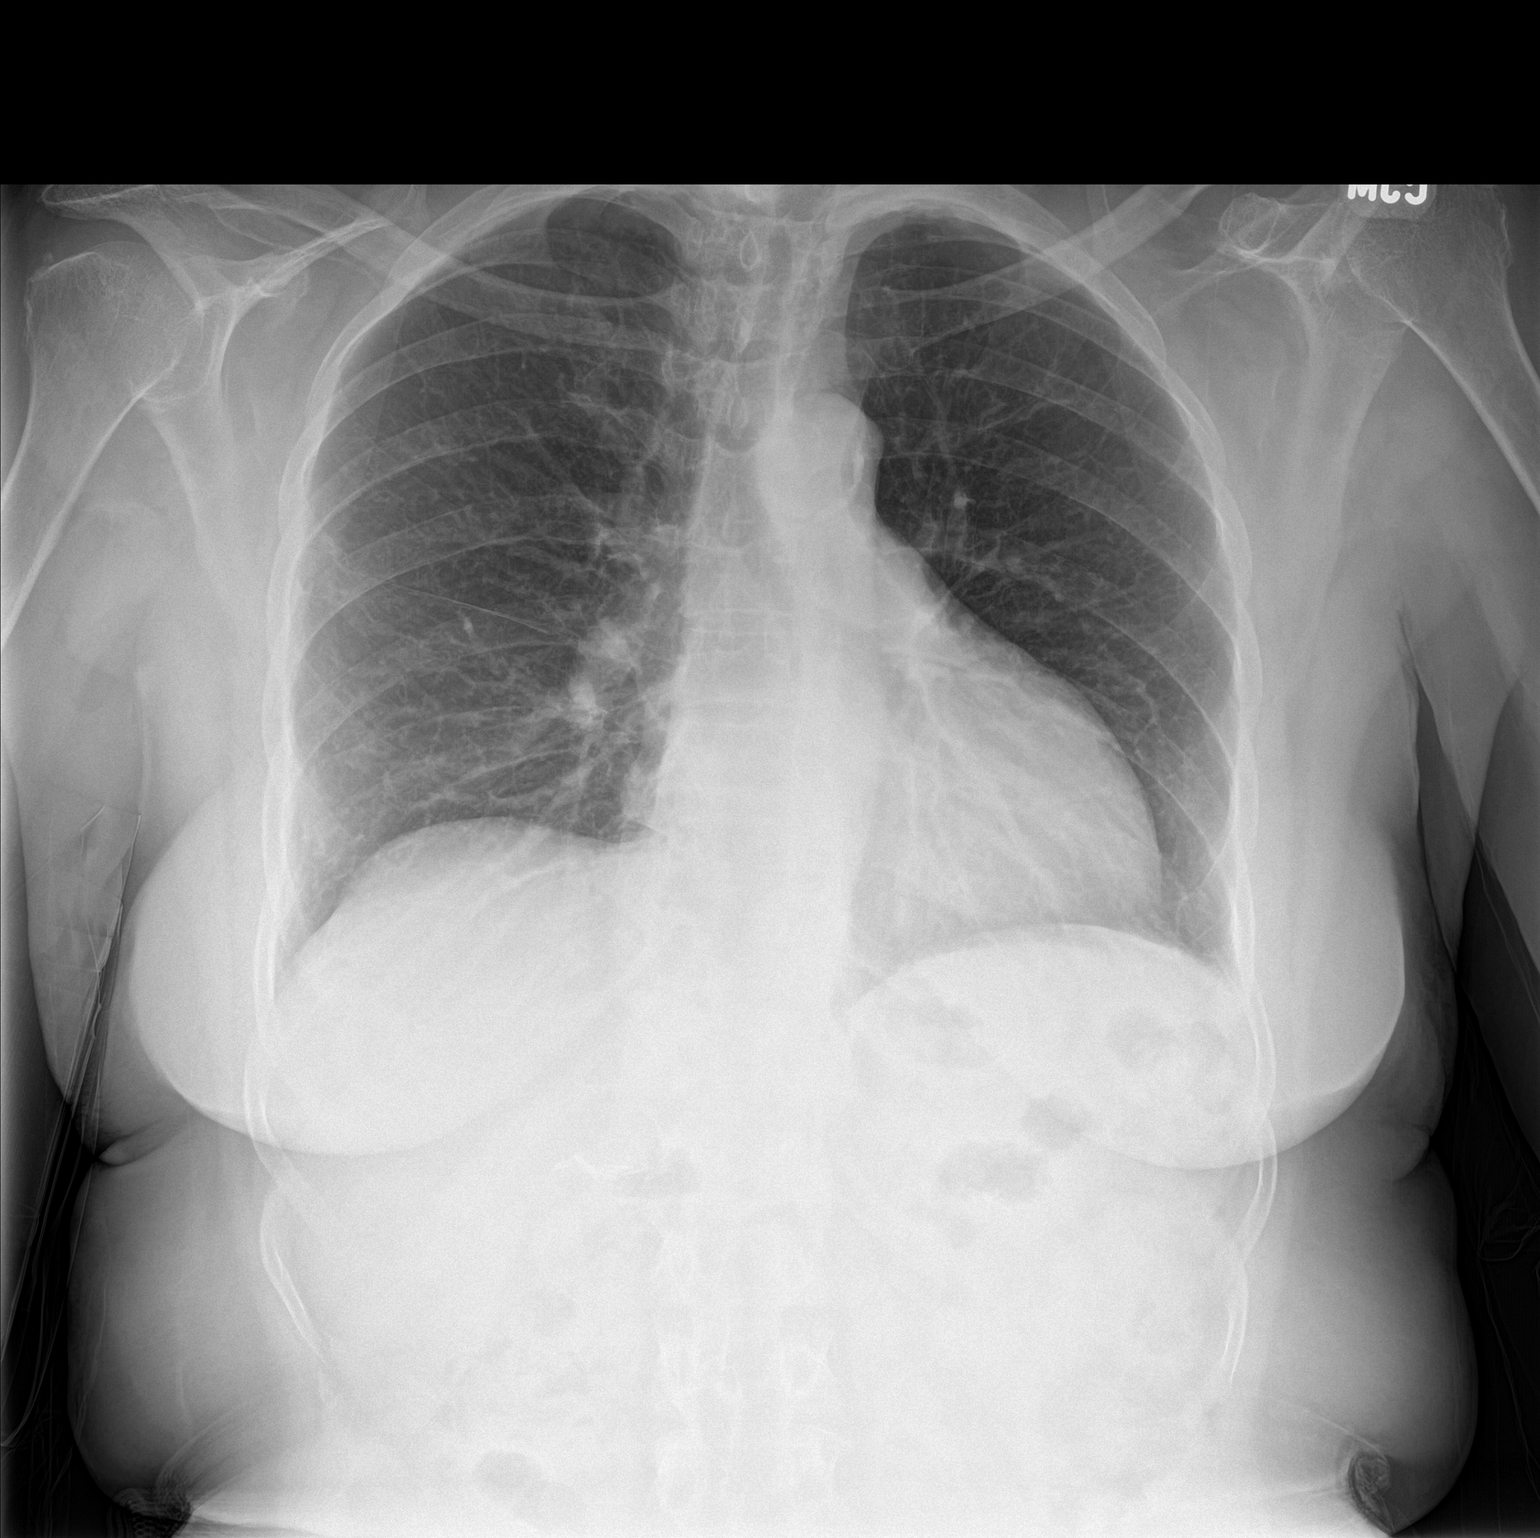

[chest lat]
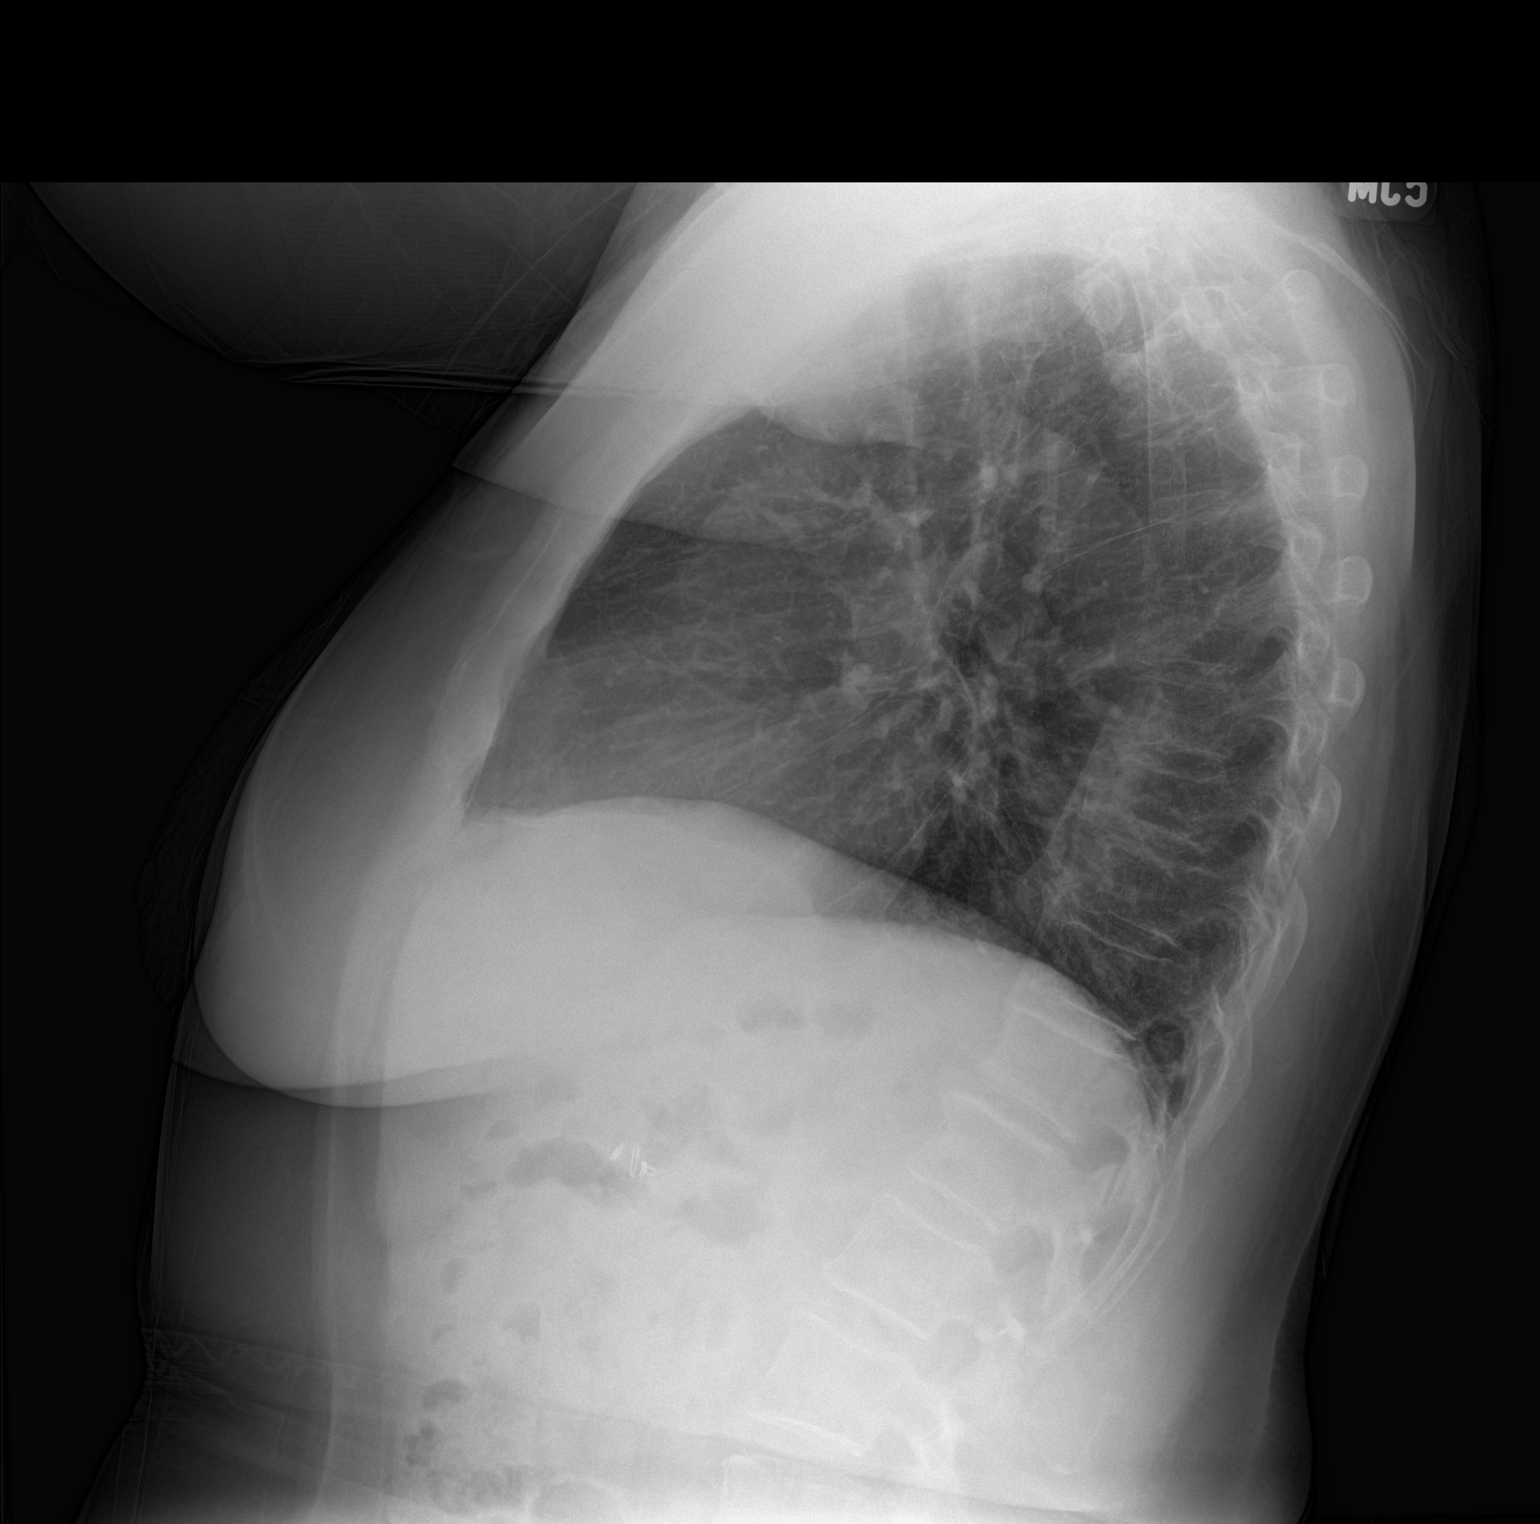

[2 of 2 positions shown; findings below may reference images not displayed]

FINDINGS: Lungs are hypoinflated without focal consolidation or effusion.
Cardiomediastinal silhouette is within normal. There is mild
calcified plaque over the aortic arch. There are mild degenerative
changes of the spine.
IMPRESSION: Hypoinflation without acute cardiopulmonary disease.

## 2017-05-20 DIAGNOSIS — M25562 Pain in left knee: Secondary | ICD-10-CM | POA: Insufficient documentation

## 2017-05-20 DIAGNOSIS — M1991 Primary osteoarthritis, unspecified site: Secondary | ICD-10-CM | POA: Insufficient documentation

## 2017-05-29 DIAGNOSIS — E785 Hyperlipidemia, unspecified: Secondary | ICD-10-CM | POA: Insufficient documentation

## 2017-05-29 DIAGNOSIS — R609 Edema, unspecified: Secondary | ICD-10-CM | POA: Insufficient documentation

## 2017-12-11 DIAGNOSIS — E875 Hyperkalemia: Secondary | ICD-10-CM | POA: Insufficient documentation

## 2017-12-11 DIAGNOSIS — G629 Polyneuropathy, unspecified: Secondary | ICD-10-CM | POA: Insufficient documentation

## 2018-05-22 HISTORY — PX: COLONOSCOPY: SHX174

## 2018-07-29 DIAGNOSIS — Z992 Dependence on renal dialysis: Secondary | ICD-10-CM | POA: Insufficient documentation

## 2019-01-26 DIAGNOSIS — I129 Hypertensive chronic kidney disease with stage 1 through stage 4 chronic kidney disease, or unspecified chronic kidney disease: Secondary | ICD-10-CM | POA: Insufficient documentation

## 2019-03-10 DIAGNOSIS — E1140 Type 2 diabetes mellitus with diabetic neuropathy, unspecified: Secondary | ICD-10-CM | POA: Insufficient documentation

## 2019-06-30 ENCOUNTER — Encounter (HOSPITAL_BASED_OUTPATIENT_CLINIC_OR_DEPARTMENT_OTHER): Payer: Self-pay

## 2019-06-30 ENCOUNTER — Ambulatory Visit (HOSPITAL_BASED_OUTPATIENT_CLINIC_OR_DEPARTMENT_OTHER): Payer: Medicare Other

## 2019-06-30 ENCOUNTER — Other Ambulatory Visit (INDEPENDENT_AMBULATORY_CARE_PROVIDER_SITE_OTHER): Payer: Medicare Other | Admitting: Specialist

## 2019-06-30 ENCOUNTER — Ambulatory Visit (INDEPENDENT_AMBULATORY_CARE_PROVIDER_SITE_OTHER): Payer: Medicare Other | Admitting: Specialist

## 2019-06-30 ENCOUNTER — Encounter (HOSPITAL_BASED_OUTPATIENT_CLINIC_OR_DEPARTMENT_OTHER): Payer: Self-pay | Admitting: Specialist

## 2019-06-30 DIAGNOSIS — N185 Chronic kidney disease, stage 5: Secondary | ICD-10-CM

## 2019-06-30 NOTE — Progress Notes (Signed)
Mary [name]    Chief Complaint   Patient presents with    Consult (Initial)     Check L AVF done over 6 years ago          History of Present Illness     Mary [name] is a 73 y.o. female who presents with history of chronic renal failure left cephalic vein transposition which was created by myself approximately 7 years ago.  Patient has had difficulty with her fistula and there has been stents placed throughout the cephalic vein with more than 80% thrombosis of the cephalic vein.    Past Medical History     Past Medical History:   Diagnosis Date    Acute deep vein thrombosis (DVT) of distal vein of left lower extremity     Arthritis     Cataracts, bilateral     Chronic kidney disease     dialysis t-th-sat    Diabetes mellitus without complication     GERD (gastroesophageal reflux disease)     Hemodialysis access site with mature fistula     Hyperlipidemia     Hypertensive disorder        Past Surgical History     Past Surgical History:   Procedure Laterality Date    AV FISTULA PLACEMENT  2012       Family History     Family History   Problem Relation Age of Onset    Diabetes Mother     Cancer Father         throat       Social History     Social History     Socioeconomic History    Marital status: Widowed     Spouse name: Not on file    Number of children: Not on file    Years of education: Not on file    Highest education level: Not on file   Occupational History    Not on file   Social Needs    Financial resource strain: Not on file    Food insecurity     Worry: Not on file     Inability: Not on file    Transportation needs     Medical: Not on file     Non-medical: Not on file   Tobacco Use    Smoking status: Never Smoker    Smokeless tobacco: Never Used   Substance and Sexual Activity    Alcohol use: No    Drug use: No    Sexual activity: Not on file   Lifestyle    Physical activity     Days per week: Not on file     Minutes per session: Not on file    Stress: Not on file    Relationships    Social connections     Talks on phone: Not on file     Gets together: Not on file     Attends religious service: Not on file     Active member of club or organization: Not on file     Attends meetings of clubs or organizations: Not on file     Relationship status: Not on file    Intimate partner violence     Fear of current or ex partner: Not on file     Emotionally abused: Not on file     Physically abused: Not on file     Forced sexual activity: Not on file   Other Topics Concern  Not on file   Social History Narrative    Not on file       Allergies     Allergies   Allergen Reactions    Penicillins Hives    Shrimp [Shellfish Allergy] Swelling       Medications     Current Outpatient Medications on File Prior to Visit   Medication Sig Dispense Refill    acetaminophen (TYLENOL) 500 MG tablet Take 1 tablet (500 mg total) by mouth every 12 (twelve) hours as needed. 30 tablet 0    aspirin 81 MG tablet Take 81 mg by mouth daily.        atorvastatin (LIPITOR) 10 MG tablet Take 10 mg by mouth daily.        calcium acetate (PHOSLO) 667 MG capsule Take 667 mg by mouth 3 (three) times daily with meals.      Febuxostat (ULORIC) 40 MG tablet Take 40 mg by mouth daily      gabapentin (NEURONTIN) 300 MG capsule Take 300 mg by mouth 3 (three) times daily.      insulin detemir (LEVEMIR) 100 UNIT/ML injection Inject into the skin      insulin lispro (HumaLOG) 100 UNIT/ML injection Inject 5 Units into the skin 3 (three) times daily before meals      midodrine (PROAMATINE) 5 MG tablet Take 5 mg by mouth daily      Multiple Vitamin (MULTIVITAMIN PO) Take by mouth      SITagliptin (JANUVIA) 25 MG tablet Take 25 mg by mouth daily      vitamin D (CHOLECALCIFEROL) 25 MCG (1000 UT) tablet Take 1,000 Units by mouth daily      [DISCONTINUED] amLODIPine (NORVASC) 2.5 MG tablet Take 2.5 mg by mouth daily.      [DISCONTINUED] b complex-vitamin c-folic acid (NEPHRO-VITE) 0.8 MG TABS Take 0.8 mg by mouth.       [DISCONTINUED] bisacodyl (DULCOLAX) 5 MG EC tablet Take 5 mg by mouth daily as needed.        [DISCONTINUED] colchicine 0.6 MG tablet Take 1 tablet (0.6 mg total) by mouth daily. 7 tablet 0    [DISCONTINUED] cyclobenzaprine (FLEXERIL) 10 MG tablet Take 10 mg by mouth 3 (three) times daily as needed.      [DISCONTINUED] diphenhydrAMINE (BENADRYL) 25 MG tablet Take 25 mg by mouth every 6 (six) hours as needed.      [DISCONTINUED] esomeprazole (NEXIUM) 40 MG capsule Take 40 mg by mouth every morning before breakfast.        [DISCONTINUED] insulin aspart (NOVOLOG) 100 UNIT/ML injection Inject 1-10 Units into the skin 3 (three) times daily before meals.        [DISCONTINUED] insulin glargine (LANTUS) 100 UNIT/ML injection Inject 10-20 Units into the skin nightly.        [DISCONTINUED] pregabalin (LYRICA) 75 MG capsule Take 75 mg by mouth 2 (two) times daily.        [DISCONTINUED] warfarin (COUMADIN) 7.5 MG tablet Take 7.5 mg by mouth daily.       No current facility-administered medications on file prior to visit.        Review of Systems     Constitutional: Negative for fevers and chills  Skin: No rash or lesions  Respiratory: Negative for cough, wheezing, or hemoptysis  Cardiovascular: as per HPI  Gastrointestinal: Negative for abdominal pain, nausea, vomiting and diarrhea  Musculoskeletal:  No arthritic symptoms  Genitourinary: Negative for dysuria  All other systems were reviewed and are negative except what  is stated in the HPI      Physical Exam     Vitals:    06/30/19 1254   BP: 97/64   Pulse: (!) 57   Temp: 97.6 F (36.4 C)   SpO2: 98%       Body mass index is 32.92 kg/m.    General:  Patient appears their stated age, well-nourished.  Alert and in no apparent distress.  Lungs: Respiratory effort unlabored, chest expansion symmetric.  Cardiac: RRR,  no JVD.   Extremities warm,  Abd: Soft, nondistended, nontender.   VQX:IHWT ROM in all 4 extremities, symmetric partially thrombosed left  brachiocephalic AV fistula  Skin: Color appropriate for race, Skin warm, dry, no gangrene, no non healing ulcers, no varicose veins , no hyperpigmentation, no lipo-dermatosclerosis  Neuro: Good insight and judgment, oriented to person, place, and time CN II-XII intact, gross motor and sensory intact     Labs     CBC:   WBC   Date/Time Value Ref Range Status   11/11/2013 05:13 PM 6.19 3.50 - 10.80 Final     RBC   Date/Time Value Ref Range Status   11/11/2013 05:13 PM 3.86 (L) 4.20 - 5.40 Final     Hgb   Date/Time Value Ref Range Status   11/11/2013 05:13 PM 11.5 (L) 12.0 - 16.0 g/dL Final     Hematocrit   Date/Time Value Ref Range Status   11/11/2013 05:13 PM 34.8 (L) 37.0 - 47.0 % Final     MCV   Date/Time Value Ref Range Status   11/11/2013 05:13 PM 90.2 80.0 - 100.0 fL Final     MCHC   Date/Time Value Ref Range Status   11/11/2013 05:13 PM 33.0 32.0 - 36.0 g/dL Final     RDW   Date/Time Value Ref Range Status   11/11/2013 05:13 PM 17 (H) 12 - 15 % Final     Platelets   Date/Time Value Ref Range Status   11/11/2013 05:13 PM 183 140 - 400 Final       CMP:   Sodium   Date/Time Value Ref Range Status   02/20/2014 04:37 PM 141 136 - 145 mEq/L Final     Potassium   Date/Time Value Ref Range Status   02/20/2014 04:37 PM 4.5 3.5 - 5.1 mEq/L Final     Chloride   Date/Time Value Ref Range Status   02/20/2014 04:37 PM 100 98 - 107 mEq/L Final     CO2   Date/Time Value Ref Range Status   02/20/2014 04:37 PM 23 22 - 29 mEq/L Final     Glucose   Date/Time Value Ref Range Status   02/20/2014 04:37 PM 194 (H) 70 - 100 mg/dL Final     Comment:     Specimen moderately lipemic. ER  Interpretive Data for Adult Female and Female Population  Indeterminate Range:  100-125 mg/dL  Equal to or greater than 126 mg/dL meets the ADA  guidelines for Diabetes Mellitus diagnosis if symptoms  are present and confirmed by repeat testing.  Random (Non-Fasting)Interpretive Data (Adults):  Equal to or greater than 200 mg/dL meets the ADA  guidelines  for Diabetes Mellitus diagnosis if symptoms  are present and confirmed by Fasting Glucose or GTT.     BUN   Date/Time Value Ref Range Status   02/20/2014 04:37 PM 75 (H) 7 - 19 mg/dL Final     Protein, Total   Date/Time Value Ref Range Status   08/15/2013 09:25 AM  6.1 6.0 - 8.3 g/dL Final     Alkaline Phosphatase   Date/Time Value Ref Range Status   08/15/2013 09:25 AM 72 40 - 150 U/L Final     AST (SGOT)   Date/Time Value Ref Range Status   08/15/2013 09:25 AM 15 5 - 34 U/L Final     ALT   Date/Time Value Ref Range Status   08/15/2013 09:25 AM 21 0 - 55 U/L Final     Anion Gap   Date/Time Value Ref Range Status   02/20/2014 04:37 PM 18.0 (H) 5.0 - 15.0 Final       Lipid Panel No results found for: CHOL, TRIG, HDL    Coags:   PT   Date/Time Value Ref Range Status   02/20/2014 04:37 PM 25.5 (H) 12.6 - 15.0 sec Final     PT INR   Date/Time Value Ref Range Status   02/20/2014 04:37 PM 2.4 (H) 0.9 - 1.1 Final     Comment:     Recommended Ranges for Protime INR:    2.0-3.0 for most medical and surgical thromboembolic states    0.2-5.8 for artificial heart valves  INR result may not represent exact Warfarin dosing level during  the transition period from Heparin to Warfarin therapy.  Recommend close clinical monitoring.     PTT   Date/Time Value Ref Range Status   11/11/2013 05:13 PM 36 23 - 37 Final         Diagnostic Imaging     I have reviewed and interpreted the vascular diagnostic imaging with the patient    Assessment and Plan       1. Chronic renal failure, stage 5         Mary [name] is a 73 y.o. female who presents with history of chronic renal failure left cephalic vein transposition which was created by myself approximately 7 years ago.  Patient has had difficulty with her fistula and there has been stents placed throughout the cephalic vein with more than 80% thrombosis of the cephalic vein.  I will attempt to salvage the current fistula by removing the old stents and perform a long patch angioplasty she  may require to have the fistula diverted to the axillary vein if the outflow track is stenotic and disease.  Meanwhile will require to have a permacath for next 4 to 6 weeks.  This was discussed with the patient and Dr. Mardi Mainland and have agreed.  The risks and benefits of the procedures were explained to the patient and agreed. The risk of infection, bleeding, thrombosis, steal syndrome, nerve injury and other complications were explained in detail and agreed.       Dr. Mina Marble I would like to thank you for allowing me to participate in the care of this patient.    Best regards    Mary Key, MD, [place], [facility]  Chief, Section of [facility]

## 2019-06-30 NOTE — Progress Notes (Signed)
:  Room        VASCULAR SURGERY PATIENT FORM       Appt Date/Time: 06/30/2019    PCP: Alric Quan, MD  Referring Physician:    New Patient (X) Follow-Up Post-op   [place] [facility] [place] [facility] Other Imaging     Chief Complaint/HPI:          Date of Last Visit: L Venous Fistula Venous 12/31/2011    Allergies   Allergen Reactions    Penicillins Hives    Shrimp [Shellfish Allergy] Swelling        Selected Medications:    Coumadin Plavix Eliquis Xarelto Aspirin Fish Oil Lovenox   Insulin Metformin Other Diabetes Atorvastatin  Rosuvastatin  Simvastatin Pravastatin  Lovastatin Other Cholesterol Pradaxa   MHx:  Stroke/TIA/Seizures Thyroid  Diabetes   Myocardial Infarction Arrhythmia  COPD/Asthma (other lung)   Kidney Disease Liver Disease DVT   Wounds Musculoskeletal (spine) Cancer             Smoker           Former Smoker   (X)       Never Smoker    Vital Signs this Visit Pulses  HT WT HR   Rad Ulnar Brach Fem Pop DP PT       R          BP L BP R SpO2  L                        Imaging results:           Plan of Care:

## 2019-07-04 ENCOUNTER — Telehealth (INDEPENDENT_AMBULATORY_CARE_PROVIDER_SITE_OTHER): Payer: Self-pay

## 2019-07-04 ENCOUNTER — Other Ambulatory Visit (INDEPENDENT_AMBULATORY_CARE_PROVIDER_SITE_OTHER): Payer: Self-pay

## 2019-07-04 ENCOUNTER — Encounter (HOSPITAL_BASED_OUTPATIENT_CLINIC_OR_DEPARTMENT_OTHER): Payer: Self-pay | Admitting: Specialist

## 2019-07-04 ENCOUNTER — Encounter (INDEPENDENT_AMBULATORY_CARE_PROVIDER_SITE_OTHER): Payer: Self-pay | Admitting: Specialist

## 2019-07-04 DIAGNOSIS — Z01818 Encounter for other preprocedural examination: Secondary | ICD-10-CM

## 2019-07-04 NOTE — Telephone Encounter (Signed)
-----   Message from [name], MD sent at 06/30/2019  2:07 PM EDT -----  Revision and thrombectomy left arm AV fistula soon    Daughter Arbie Cookey [phone]    Cell phone [phone] patient

## 2019-07-04 NOTE — Telephone Encounter (Signed)
Patient scheduled for: 07/21/2019    Surgeon: Shary Key, M.D.  Procedure: Revision and thrombectomy left arm AV fistula   Arrive at: 10:30 am - time subject to change   Anesthesia: Choice   Location: [facility]  Special instructions: NPO   Knows to have Labs: Not Needed  Need medication instructions: Yes    Mailing out packet: Will mail with COVID orders

## 2019-07-04 NOTE — Telephone Encounter (Signed)
Medication Instructions     - For Mary [name] DOB 1946-11-16 for LUE AVF Revision/Thrombectomy on 07/21/2019 at 1150 (arriving at 0950) with Dr. Lutricia Feil at [facility])    - Allergies: PCN (hives), Shellfish (Swelling)    - Nothing by mouth after midnight onwards to morning of procedure except small sips of water to take morning medications    - Medications to take Day of Surgery: Tylenol (as prescribed/as needed), Aspirin, Lipitor, Uloric, Neurontin, Proamatine    - Medications to hold Day of Surgery: Levemir, Humalog, Phoslo, Multiple Vitamin, Januvia, Vitamin D, All Vitamins/Supplements/Minerals, and All Over the Counter Medications    - A [facility] RN will call closer to Day of Surgery with detailed instructions/information    - RN transferred Instructions to letter head, scanned into pt's media file and given to [facility] for procedure packet

## 2019-07-07 ENCOUNTER — Ambulatory Visit: Payer: Medicare Other | Attending: Specialist

## 2019-07-07 HISTORY — PX: INSERTION PERMACATH: SHX510477

## 2019-07-07 NOTE — Pre-Procedure Instructions (Addendum)
•   Surgical Risk Level : (Low, Intermediate, High)  o Low     Surgeon Testing Requirements:  o na     Anesthesia Guideline Requirements:  o na     Specialist Notes / Test Results / Records Requested:  o na  o    Recent Hospitalization / ED Visit:   o na     Future Plan / Upcoming Appts:   o COVID test-pt to call [facility] to make appt within 72 hours of surgery     Labs/Testing @ [facility]:   o na     Email Sent To Patient:   o na  o    Faxes Sent To:   o COVID order faxed to [place] and [phone]     Epic Orders Entered:   o na     Other Outlying information gathered that does not fit anywhere else  o na     Chart Room Handoff for Further  Follow-up if Applicable:  o na     NPO instructions provided:             NPO after MN -clear liq until 6 hours prior to surgery-read back  Water clear juices-no pulp carbonated clear beverages gatorade black coffee/tea-no creamer    -COVID script discussed w/pt and daughter

## 2019-07-12 ENCOUNTER — Other Ambulatory Visit (INDEPENDENT_AMBULATORY_CARE_PROVIDER_SITE_OTHER): Payer: Self-pay | Admitting: Student in an Organized Health Care Education/Training Program

## 2019-07-18 ENCOUNTER — Ambulatory Visit (FREE_STANDING_LABORATORY_FACILITY): Payer: Medicare Other

## 2019-07-18 DIAGNOSIS — Z1159 Encounter for screening for other viral diseases: Secondary | ICD-10-CM

## 2019-07-18 DIAGNOSIS — Z01818 Encounter for other preprocedural examination: Secondary | ICD-10-CM

## 2019-07-19 LAB — COVID-19 (SARS-COV-2): SARS CoV 2 Overall Result: NOT DETECTED

## 2019-07-19 NOTE — Pre-Procedure Instructions (Signed)
-  Pt daughter called to confirm medication instructions. She received instructions from surgeon's office and wanted to verify. Reviewed medication instructions w/pt daughter.

## 2019-07-20 ENCOUNTER — Telehealth (INDEPENDENT_AMBULATORY_CARE_PROVIDER_SITE_OTHER): Payer: 59 | Admitting: Student in an Organized Health Care Education/Training Program

## 2019-07-20 NOTE — Pre-Procedure Instructions (Signed)
Patient's daughter , Archie Patten, called to review all of patient's medication instructions. Reviewed all. Aware to contact Dr.Hashemi for asa instructions and pcp for insulin instructions.

## 2019-07-20 NOTE — Pre-Procedure Instructions (Signed)
Next day surgery reminder call:  Spoke with daughter, Arbie Cookey.  Surgery time 1200 / arrival 1000.  NPO after midnight per surgeon instruction.  Covid screening questions all answered negative.

## 2019-07-20 NOTE — Progress Notes (Signed)
Pt daughter called at 5:16PM wants to know if her mom should hold off on the insulin. Per Dr Leontine Locket pt can have her insulin dose tonight and hold off on the AM.Pt daughter informed and v/u

## 2019-07-21 ENCOUNTER — Ambulatory Visit: Payer: Medicare Other | Admitting: Anesthesiology

## 2019-07-21 ENCOUNTER — Ambulatory Visit
Admission: RE | Admit: 2019-07-21 | Discharge: 2019-07-21 | Disposition: A | Discharge status: Home / Self Care | Payer: Medicare Other | Source: Ambulatory Visit | Attending: Specialist | Admitting: Specialist

## 2019-07-21 ENCOUNTER — Encounter
Admission: RE | Disposition: A | Discharge status: Home / Self Care | Payer: Self-pay | Source: Ambulatory Visit | Attending: Specialist

## 2019-07-21 DIAGNOSIS — Z992 Dependence on renal dialysis: Secondary | ICD-10-CM | POA: Insufficient documentation

## 2019-07-21 DIAGNOSIS — K219 Gastro-esophageal reflux disease without esophagitis: Secondary | ICD-10-CM | POA: Insufficient documentation

## 2019-07-21 DIAGNOSIS — Z7982 Long term (current) use of aspirin: Secondary | ICD-10-CM | POA: Insufficient documentation

## 2019-07-21 DIAGNOSIS — E1122 Type 2 diabetes mellitus with diabetic chronic kidney disease: Secondary | ICD-10-CM | POA: Insufficient documentation

## 2019-07-21 DIAGNOSIS — E785 Hyperlipidemia, unspecified: Secondary | ICD-10-CM | POA: Insufficient documentation

## 2019-07-21 DIAGNOSIS — T82868A Thrombosis of vascular prosthetic devices, implants and grafts, initial encounter: Secondary | ICD-10-CM | POA: Insufficient documentation

## 2019-07-21 DIAGNOSIS — N186 End stage renal disease: Secondary | ICD-10-CM | POA: Insufficient documentation

## 2019-07-21 DIAGNOSIS — N185 Chronic kidney disease, stage 5: Secondary | ICD-10-CM

## 2019-07-21 DIAGNOSIS — T82858A Stenosis of vascular prosthetic devices, implants and grafts, initial encounter: Secondary | ICD-10-CM | POA: Insufficient documentation

## 2019-07-21 DIAGNOSIS — Z86718 Personal history of other venous thrombosis and embolism: Secondary | ICD-10-CM | POA: Insufficient documentation

## 2019-07-21 DIAGNOSIS — Z794 Long term (current) use of insulin: Secondary | ICD-10-CM | POA: Insufficient documentation

## 2019-07-21 HISTORY — DX: Type 2 diabetes mellitus without complications: E11.9

## 2019-07-21 HISTORY — DX: Acute embolism and thrombosis of unspecified deep veins of unspecified distal lower extremity: I82.4Z9

## 2019-07-21 HISTORY — DX: Unspecified visual disturbance: H53.9

## 2019-07-21 HISTORY — PX: REVISION, A-V FISTULA UPPER EXTREMITY: SHX5416

## 2019-07-21 HISTORY — PX: THROMBECTOMY: SHX45

## 2019-07-21 LAB — GLUCOSE WHOLE BLOOD - POCT
Whole Blood Glucose POCT: 108 mg/dL — ABNORMAL HIGH (ref 70–100)
Whole Blood Glucose POCT: 99 mg/dL (ref 70–100)

## 2019-07-21 LAB — POTASSIUM WHOLE BLOOD: Whole Blood Potassium: 3.8 mEq/L (ref 3.6–5.0)

## 2019-07-21 SURGERY — REVISION, A-V FISTULA, UPPER EXTREMITY
Anesthesia: Anesthesia General | Site: Arm Upper | Laterality: Left | Wound class: Clean

## 2019-07-21 MED ORDER — CEFAZOLIN SODIUM 1 G IJ SOLR
INTRAMUSCULAR | Status: DC | PRN
Start: 2019-07-21 — End: 2019-07-21
  Administered 2019-07-21: 2 g via INTRAVENOUS

## 2019-07-21 MED ORDER — CEFAZOLIN SODIUM-DEXTROSE 2-5 GM/50ML-% IV SOLN
INTRAVENOUS | Status: AC
Start: 2019-07-21 — End: ?
  Filled 2019-07-21: qty 50

## 2019-07-21 MED ORDER — OXYCODONE-ACETAMINOPHEN 5-325 MG PO TABS
1.0000 | ORAL_TABLET | ORAL | 0 refills | Status: DC | PRN
Start: 2019-07-21 — End: 2020-05-10

## 2019-07-21 MED ORDER — PROPOFOL 10 MG/ML IV EMUL (WRAP)
INTRAVENOUS | Status: AC
Start: 2019-07-21 — End: ?
  Filled 2019-07-21: qty 40

## 2019-07-21 MED ORDER — PROTAMINE SULFATE 10 MG/ML IV SOLN
INTRAVENOUS | Status: AC
Start: 2019-07-21 — End: ?
  Filled 2019-07-21: qty 5

## 2019-07-21 MED ORDER — ONDANSETRON HCL 4 MG/2ML IJ SOLN
INTRAMUSCULAR | Status: DC | PRN
Start: 2019-07-21 — End: 2019-07-21
  Administered 2019-07-21: 4 mg via INTRAVENOUS

## 2019-07-21 MED ORDER — BUPIVACAINE HCL (PF) 0.5 % IJ SOLN
INTRAMUSCULAR | Status: AC
Start: 2019-07-21 — End: ?
  Filled 2019-07-21: qty 30

## 2019-07-21 MED ORDER — HEPARIN SODIUM (PORCINE) 5000 UNIT/ML IJ SOLN
7500.0000 [IU] | Freq: Once | INTRAMUSCULAR | Status: AC
Start: 2019-07-21 — End: 2019-07-21

## 2019-07-21 MED ORDER — THROMBIN 5000 UNITS EX SOLR
CUTANEOUS | Status: DC | PRN
Start: 2019-07-21 — End: 2019-07-21
  Administered 2019-07-21: 5000 [IU] via TOPICAL

## 2019-07-21 MED ORDER — HEPARIN SODIUM (PORCINE) 5000 UNIT/ML IJ SOLN
INTRAMUSCULAR | Status: AC
Start: 2019-07-21 — End: ?
  Filled 2019-07-21: qty 2

## 2019-07-21 MED ORDER — GELATIN ABSORBABLE 12-7 MM EX MISC
CUTANEOUS | Status: AC
Start: 2019-07-21 — End: ?
  Filled 2019-07-21: qty 1

## 2019-07-21 MED ORDER — PROPOFOL INFUSION 10 MG/ML
INTRAVENOUS | Status: DC | PRN
Start: 2019-07-21 — End: 2019-07-21
  Administered 2019-07-21: 75 ug/kg/min via INTRAVENOUS

## 2019-07-21 MED ORDER — HEPARIN SODIUM (PORCINE) 5000 UNIT/ML IJ SOLN
INTRAMUSCULAR | Status: AC
Start: 2019-07-21 — End: 2019-07-21
  Administered 2019-07-21: 13:00:00 7500 [IU] via INTRAVENOUS
  Filled 2019-07-21: qty 1

## 2019-07-21 MED ORDER — SODIUM CHLORIDE 0.9 % IV SOLN
INTRAVENOUS | Status: DC
Start: 2019-07-21 — End: 2019-07-21

## 2019-07-21 MED ORDER — FENTANYL CITRATE (PF) 50 MCG/ML IJ SOLN (WRAP)
INTRAMUSCULAR | Status: AC
Start: 2019-07-21 — End: ?
  Filled 2019-07-21: qty 2

## 2019-07-21 MED ORDER — SODIUM CHLORIDE 0.9% BAG (IRRIGATION USE)
INTRAVENOUS | Status: DC | PRN
Start: 2019-07-21 — End: 2019-07-21
  Administered 2019-07-21: 1000 mL

## 2019-07-21 MED ORDER — MIDAZOLAM HCL 1 MG/ML IJ SOLN (WRAP)
INTRAMUSCULAR | Status: DC | PRN
Start: 2019-07-21 — End: 2019-07-21
  Administered 2019-07-21: 0.5 mg via INTRAVENOUS

## 2019-07-21 MED ORDER — HEPARIN SODIUM (PORCINE) 5000 UNIT/ML IJ SOLN
INTRAMUSCULAR | Status: DC
Start: 2019-07-21 — End: 2019-07-21
  Filled 2019-07-21: qty 1

## 2019-07-21 MED ORDER — KETAMINE HCL 50 MG/ML IJ SOLN
INTRAMUSCULAR | Status: DC | PRN
Start: 2019-07-21 — End: 2019-07-21
  Administered 2019-07-21: 5 mg via INTRAVENOUS

## 2019-07-21 MED ORDER — FENTANYL CITRATE (PF) 50 MCG/ML IJ SOLN (WRAP)
INTRAMUSCULAR | Status: DC | PRN
Start: 2019-07-21 — End: 2019-07-21
  Administered 2019-07-21 (×2): 25 ug via INTRAVENOUS

## 2019-07-21 MED ORDER — BUPIVACAINE HCL 0.5 % IJ SOLN
INTRAMUSCULAR | Status: DC | PRN
Start: 2019-07-21 — End: 2019-07-21
  Administered 2019-07-21: 10 mL

## 2019-07-21 MED ORDER — THROMBIN 5000 UNITS EX SOLR
CUTANEOUS | Status: AC
Start: 2019-07-21 — End: ?
  Filled 2019-07-21: qty 5000

## 2019-07-21 MED ORDER — GLYCOPYRROLATE 0.2 MG/ML IJ SOLN (WRAP)
INTRAMUSCULAR | Status: DC | PRN
Start: 2019-07-21 — End: 2019-07-21
  Administered 2019-07-21: 0.1 mg via INTRAVENOUS

## 2019-07-21 MED ORDER — PHENYLEPHRINE 100 MCG/ML IV SOSY (WRAP)
PREFILLED_SYRINGE | INTRAVENOUS | Status: DC | PRN
Start: 2019-07-21 — End: 2019-07-21
  Administered 2019-07-21: 50 ug via INTRAVENOUS
  Administered 2019-07-21 (×5): 100 ug via INTRAVENOUS

## 2019-07-21 MED ORDER — KETAMINE HCL 50 MG/ML IJ SOLN
INTRAMUSCULAR | Status: AC
Start: 2019-07-21 — End: ?
  Filled 2019-07-21: qty 0.1

## 2019-07-21 MED ORDER — LACTATED RINGERS IV SOLN
INTRAVENOUS | Status: DC
Start: 2019-07-21 — End: 2019-07-21

## 2019-07-21 MED ORDER — PHENYLEPHRINE HCL 10 MG/ML IV SOLN (WRAP)
Status: AC
Start: 2019-07-21 — End: ?
  Filled 2019-07-21: qty 1

## 2019-07-21 MED ORDER — LIDOCAINE-EPINEPHRINE 1 %-1:100000 IJ SOLN
INTRAMUSCULAR | Status: DC | PRN
Start: 2019-07-21 — End: 2019-07-21
  Administered 2019-07-21: 10 mL

## 2019-07-21 MED ORDER — SODIUM CHLORIDE 0.9 % IV SOLN
INTRAVENOUS | Status: DC | PRN
Start: 2019-07-21 — End: 2019-07-21

## 2019-07-21 MED ORDER — LIDOCAINE-EPINEPHRINE 1 %-1:100000 IJ SOLN
INTRAMUSCULAR | Status: AC
Start: 2019-07-21 — End: ?
  Filled 2019-07-21: qty 20

## 2019-07-21 MED ORDER — MIDAZOLAM HCL 1 MG/ML IJ SOLN (WRAP)
INTRAMUSCULAR | Status: AC
Start: 2019-07-21 — End: ?
  Filled 2019-07-21: qty 2

## 2019-07-21 SURGICAL SUPPLY — 63 items
ADHESIVE LIQUID WATERPROOF VIAL PREP NONSTAIN MASTISOL STYRAX GUM (Skin Closure) ×1 IMPLANT
ADHESIVE LQ STYRAX GUM MASTIC ALC MTHY (Skin Closure) ×2 IMPLANT
BANDAGE CMPR PLSTR CTTN MED MTRX 5YDX6IN (Bandage) ×2
BANDAGE ELASTIC L5 YD X W6 IN W/SELF-CLOSURE HOOK (Bandage) ×1 IMPLANT
BANDAGE MEDLINE MEDIUM COMPRESSION L5 YD (Bandage) ×1 IMPLANT
CATH EMB ARTERIAL 40CM 4F (Vascular) ×2 IMPLANT
CATH FOGARTY BILIARY 5FR (Catheter Micellaneous) ×1
CATH FOGARTY BILIARY 5FR (Catheter Miscellaneous) ×1 IMPLANT
CATHETER IV FEP PCTV-W 20GA 1IN LF STRL (IV Supply) ×2
CATHETER IV OD20 GA L1 IN RADIOPAQUE (IV Supply) ×1 IMPLANT
CATHETER IV OD20 GA L1 IN RADIOPAQUE WING HUB PROTECTIV PERIPHERAL FEP (IV Supply) ×1 IMPLANT
CLIP INTERNAL SMALL WIDE CHEVRON HEART (Clips) ×1 IMPLANT
CLIP INTERNAL SMALL WIDE CHEVRON LIGATE TRIANGULATE CROSS SECTION (Clips) ×1 IMPLANT
CLIP INTNL TI SM W CHEVRON HRT WECK HRZN (Clips) ×2
DRESSING TRANSPARENT L2 3/4 IN X W2 3/8 (Dressing) ×1 IMPLANT
DRESSING TRANSPARENT L2 3/4 IN X W2 3/8 IN POLYURETHANE ADHESIVE (Dressing) ×1 IMPLANT
DRESSING TRANSPARENT L4 3/4 IN X W4 IN (Dressing) ×1 IMPLANT
DRESSING TRANSPARENT L4 3/4 IN X W4 IN POLYURETHANE ADHESIVE (Dressing) ×1 IMPLANT
DRESSING TRNS PU STD TGDRM 2.75INX2 3/8 (Dressing) ×2
DRESSING TRNS PU STD TGDRM 4.75X4IN LF (Dressing) ×2
ELECTRODE ADULT PATIENT RETURN L9 FT REM POLYHESIVE ACRYLIC FOAM (Procedure Accessories) ×1 IMPLANT
ELECTRODE PATIENT RETURN L9 FT VALLEYLAB (Procedure Accessories) ×1 IMPLANT
ELECTRODE PT RTN RM PHSV ACRL FM C30- LB (Procedure Accessories) ×2
FILTER SMKEVC NPTN 2 HEPA FLD SCT ACT C (Filter) ×2
FILTER SMOKE EVACUATOR HEPA FLUID (Filter) ×1 IMPLANT
FILTER SMOKE EVACUATOR HEPA FLUID SUCTION ACTIVATE CARBON NEPTUNE 2 (Filter) ×1 IMPLANT
GLOVE SRG NTR RBR 8 BGL SRG LTX STRL PF (Glove) ×2
GLOVE SURGICAL 8 BIOGEL SURGEONS POWDER (Glove) ×1 IMPLANT
GLOVE SURGICAL 8 BIOGEL SURGEONS POWDER FREE BEAD CUFF TEXTURE SURFACE (Glove) ×1 IMPLANT
PACK VASCULAR PREP (Procedure Accessories) ×2 IMPLANT
PATCH VASC BVN PRICRD XENOSURE 15X2.5CM (Patch) ×2 IMPLANT
PATCH VASCULAR L15 CM X W2.5 CM XENOSURE (Patch) ×1 IMPLANT
PATCH VASCULAR L15 CM X W2.5 CM XENOSURE BOVINE PERICARDIUM (Patch) ×1 IMPLANT
SOLUTION IV 0.9% NACL 500ML VFLX LF PLS (IV Solutions) ×2
SOLUTION IV 0.9% SODIUM CHLORIDE 500 ML (IV Solutions) ×1 IMPLANT
SOLUTION IV 0.9% SODIUM CHLORIDE 500 ML PLASTIC CONTAINER (IV Solutions) ×1 IMPLANT
SPONGE GAUZE L4 IN X W4 IN 16 PLY (Dressing) ×1 IMPLANT
SPONGE GAUZE L4 IN X W4 IN 16 PLY MAXIMUM ABSORBENT USP TYPE VII (Dressing) ×1 IMPLANT
SPONGE GZE CTTN CRTY 4X4IN LF NS 16 PLY (Dressing) ×2
STOPCOCK 4-WAY LARGE BORE (IV Supply) ×1
STOPCOCK IV 4 WAY LARGE BORE ROTATE MALE (IV Supply) ×1 IMPLANT
STOPCOCK IV 4 WAY LARGE BORE ROTATE MALE LUER LOCK ADAPTER LIPID (IV Supply) ×1 IMPLANT
STOPCOCK IV LF STRL 4W LG BORE ROT M LL (IV Supply) ×1
STRAP DVN PSTN 60X3IN VLCR KN LF DISP (Procedure Accessories) ×2
STRAP PSTN VLCR DVN 60X3IN LF KN DISP (Procedure Accessories) ×2 IMPLANT
STRIP SKIN CLOSURE L5 IN X W1 IN (Dressing) ×1 IMPLANT
STRIP SKIN CLOSURE L5 IN X W1 IN REINFORCE STERI-STRIP POLYESTER WHITE (Dressing) ×1 IMPLANT
STRIP SKNCLS PLSTR STRSTRP 5X1IN LF STRL (Dressing) ×2
SUTURE ABS 3-0 SH MNCRL 27IN MFL UD (Suture) ×2
SUTURE MONOCRYL 3-0 SH L27 IN (Suture) ×1 IMPLANT
SUTURE MONOCRYL 3-0 SH L27 IN MONOFILAMENT UNDYED ABSORBABLE (Suture) ×1 IMPLANT
SUTURE NABSB 5-0 C-1 PRLN 36IN 2 ARM MFL (Suture) ×4 IMPLANT
SYRINGE 1 ML GRADUATE LOK MDCL (Syringes, Needles) ×1 IMPLANT
SYRINGE 1 ML GRADUATE LOK MEDICAL (Syringes, Needles) ×1
SYRINGE 3 ML GRADUATE NONPYROGENIC DEHP (Syringes, Needles) ×2
SYRINGE 3 ML GRADUATE NONPYROGENIC DEHP FREE PVC FREE LOK MEDICAL (Syringes, Needles) ×2 IMPLANT
SYRINGE 3ML GRADUATE NONPYROGENIC DEHP (Syringes, Needles) ×2 IMPLANT
SYRINGE 5 ML GRADUATE NONPYROGENIC DEHP (Syringes, Needles) ×1 IMPLANT
SYRINGE 5 ML GRADUATE NONPYROGENIC DEHP FREE PVC FREE LOK MEDICAL (Syringes, Needles) ×1 IMPLANT
SYRINGE LUER-LOK BLISTR PK 3ML (Syringes, Needles) ×2
SYRINGE MED 3ML LL LF STRL GRAD N-PYRG (Syringes, Needles) ×2
SYRINGE MED 5ML LL LF STRL GRAD N-PYRG (Syringes, Needles) ×2
SYRINGE MED LL LF STRL GRAD DISP (Syringes, Needles) ×2

## 2019-07-21 NOTE — H&P (Signed)
History and Physical currently available, in Epic or on paper, has been reviewed, and there are no major changes. Pt. seen and examined by me prior to procedure.     Gabriell Casimir A Essense Bousquet, MD

## 2019-07-21 NOTE — Plan of Care (Signed)
Blood drawn and Iv connected thru perma cath by dr. Lutricia Feil.

## 2019-07-21 NOTE — Discharge Instr - AVS First Page (Signed)
ACCESS DISCHARGE INSTRUCTIONS    Surgeon: Anani Gu MD    Wound Care:  If there is an ACE please remove in AM.  Remove dressing in 48 hours and wet incision site  Remove steri strips in 5 days        Pain Control:  Some pain and swelling is normal after surgery.  You may take the pain medication as prescribed.        Driving:  When pain is controlled and not taking pain medications      Diet:   You may not feel hungry all the time or you may eat a small amount and feel full.  This is ok.  Some people find eating multiple small meals is easier than 3 large meals.  Continue to drink lots of clear fluids.      Reasons to call the doctor:  Call your doctor or go to emergency room if you have any of the following:   Fever greater than 101.5 F   Pus draining from your wound   Redness around your wound that is spreading   You may not drive or do anything requiring coordination or balance for 24 hours.     Avoid heavy lifting for 1 weeks after any surgery.    Persistent bleeding, swelling or pus at the operative site.   Loss of feeling, severe pain, inability to move fingers or cold hand.    Follow up Care:  Call the office [phone] and make an appointment  To have a duplex US of the fistula in my office in 4 weeks and see me.

## 2019-07-21 NOTE — Transfer of Care (Signed)
Anesthesia Transfer of Care Note    Patient: MEOSHIA BILLING    Procedures performed: Procedure(s):  REVISION, A-V FISTULA UPPER EXTREMITY, WITH PATCH ANGIOPLASTY  THROMBECTOMY    Anesthesia type: General TIVA    Patient location:Phase I PACU    Last vitals:   Vitals:    07/21/19 1253   BP: 94/50   Pulse: 65   Resp:    Temp:    SpO2: 98%       Post pain: Patient not complaining of pain, continue current therapy      Mental Status:awake and alert     Respiratory Function: tolerating room air    Cardiovascular: stable    Nausea/Vomiting: patient not complaining of nausea or vomiting    Hydration Status: adequate    Post assessment: no apparent anesthetic complications, no reportable events and no evidence of recall    Signed by: Ernst Breach  07/21/19 12:53 PM    Verbal report given to PACU RN.  BP treated with 112mcg Neo.  RN and Dr.Hashemi aware.  RN to call MD if doesn't increase as pt wakes up.

## 2019-07-21 NOTE — Op Note (Signed)
FULL OPERATIVE NOTE    Date Time: 07/21/19 12:47 PM  Patient Name: Mary [name]  Attending Physician: [name], MD      Date of Operation:   07/21/2019    Providers Performing:   Surgeon(s):  [name], MD    Circulator: Jeral Pinch, RN  Relief Circulator: Manson Allan, RN  Relief Scrub: Manson Allan, RN  Scrub Person: Edwin Dada, Alexus  First Assistant: Laverle Patter  Preceptor: Phylliss Blakes, RN    Operative Procedure:   Procedure(s):  REVISION, A-V FISTULA UPPER EXTREMITY, WITH PATCH ANGIOPLASTY  THROMBECTOMY    Preoperative Diagnosis:   Pre-Op Diagnosis Codes:     * End stage renal disease [N18.6]    Postoperative Diagnosis:   Post-Op Diagnosis Codes:     * End stage renal disease [N18.6]    Indications:   Patient has chronic renal failure with stenosis and stents throughout the left brachiocephalic AV fistula with area of thrombus formation which was almost completely occlusive.  The risks and benefits of the procedures were explained to the patient and agreed. The risk of infection, bleeding, thrombosis, steal syndrome, nerve injury and other complications were explained in detail and agreed.       Operative Notes:   Patient was brought to the operating suite was placed in supine position.  2 g of Ancef was given intravenously.  Left arm cephalic vein transposition was marked left arm was prepped and draped in sterile fashion.  15 cm long incision was made approximately 2 cm medial to the cephalic vein over the old incisions.  The cephalic vein was completely dissected out.  Vascular clamps were applied venotomy was made using 11 blade and Potts scissors.  Then #5 Fogarty was used to obtain hemostasis in the axillary vein and #4 Fogarty was used to obtain hemostasis in the brachial artery venotomy was extended to the arterial anastomosis and proximally into the axillary vein.  Was well organized thrombus in the distal aspect of the fistula and thrombectomy carried  out.  Previously multiple stents had been placed in this location.  15 cm pericardial patch was used with 5-0 Prolene and a patch long segment patch angioplasty was performed in standard fashion then flow was stored there was excellent thrill present skin flaps were created.  Further local anesthesia was administered.  Complete hemostasis was achieved using electrocautery.  The wound was closed using 3-0 Monocryl in interrupted fashion skin was closed using 4-0 Monocryl in subcuticular fashion prior to closure of the wound 5000 units of thrombin was placed in the cavity.  Steri-Strips were applied dressings were applied arm was wrapped with Ace bandages patient tolerated the procedure well was transferred to the recovery room.      Estimated Blood Loss:   * No values recorded between 07/21/2019 11:27 AM and 07/21/2019 12:47 PM *    Implants:     Implant Name Type Inv. Item Serial No. Manufacturer Lot No. LRB No. Used Action   PATCH XENOSURE PERCRD [id number] - SNA Patch PATCH XENOSURE PERCRD 2.5X15CM NA LEMAITRE VASCULAR [id number] Left 1 Implanted       Drains:       Specimens:       Complications:       Signed by: [name], MD

## 2019-07-21 NOTE — Anesthesia Postprocedure Evaluation (Signed)
Anesthesia Post Evaluation    Patient: Mary [name]    Procedure(s):  REVISION, A-V FISTULA UPPER EXTREMITY, WITH PATCH ANGIOPLASTY  THROMBECTOMY    Anesthesia type: general    Last Vitals:   Vitals Value Taken Time   BP 97/51 07/21/2019  1:10 PM   Pulse 72 07/21/2019  1:10 PM   Resp 16 07/21/2019  1:10 PM   SpO2 95 % 07/21/2019  1:10 PM                 Anesthesia Post Evaluation:     Patient Evaluated: bedside  Patient Participation: complete - patient cannot participate  Level of Consciousness: awake and alert  Pain Score: 0  Pain Management: adequate    Airway Patency: patent    Anesthetic complications: No      PONV Status: none    Cardiovascular status: acceptable  Respiratory status: acceptable  Hydration status: acceptable  Comments: Anesthesia Post Evaluation    Patient: Mary [name]    Procedures performed: Procedure(s):  REVISION, A-V FISTULA UPPER EXTREMITY, WITH PATCH ANGIOPLASTY  THROMBECTOMY    Anesthesia type: General TIVA    Patient location:Phase II PACU    Post pain: Patient not complaining of pain, continue current therapy     Mental Status:awake    Respiratory Function: tolerating room air    Cardiovascular: stable    Nausea/Vomiting: patient not complaining of nausea or vomiting    Hydration Status: adequate    Post assessment: no apparent anesthetic complications and no reportable events        Anesthesia Post Evaluation    Patient: Mary [name]    Procedures performed: Procedure(s):  REVISION, A-V FISTULA UPPER EXTREMITY, WITH PATCH ANGIOPLASTY  THROMBECTOMY    Anesthesia type: General TIVA    Patient location:Phase II PACU    Post pain: Patient not complaining of pain, continue current therapy     Mental Status:awake    Respiratory Function: tolerating room air    Cardiovascular: stable    Nausea/Vomiting: patient not complaining of nausea or vomiting    Hydration Status: adequate    Post assessment: no apparent anesthetic complications and no reportable events                Signed  by: Mickel Crow, 07/21/2019 1:16 PM

## 2019-07-21 NOTE — Anesthesia Preprocedure Evaluation (Signed)
Anesthesia Evaluation    AIRWAY    Mallampati: I    TM distance: >3 FB  Neck ROM: full  Mouth Opening:full   CARDIOVASCULAR    cardiovascular exam normal, regular and normal       DENTAL    no notable dental hx    (+) upper dentures and lower dentures   PULMONARY    pulmonary exam normal and clear to auscultation     OTHER FINDINGS                  Relevant Problems   PULMONARY   (+) Dyspnea      CARDIO   (+) Hypertension      GU/RENAL   (+) Chronic renal failure, stage 5   (+) ESRD (end stage renal disease)      ENDO   (+) DM (diabetes mellitus)               Anesthesia Plan    ASA 4     general               (Risk and benefits of anesthesia explained to patient. Questions answered, consent obtained.  )      intravenous induction   Detailed anesthesia plan: general IV            informed consent obtained                   Signed by: Mickel Crow 07/21/19 10:47 AM

## 2019-07-21 NOTE — Discharge Instructions (Signed)
Discharge Instructions: After Your Surgery  You’ve just had surgery. During surgery, you were given medicine called anesthesia to keep you relaxed and free of pain. After surgery, you may have some pain or nausea. This is common. Here are some tips for feeling better and getting well after surgery.     Stay on schedule with your medicine.   Going home  Your healthcare provider will show you how to take care of yourself when you go home. He or she will also answer your questions. Have an adult family member or friend drive you home. For the first 24 hours after your surgery:  · Don't drive or use heavy equipment.  · Don't make important decisions or sign legal papers.  · Don't drink alcohol.  · Have someone stay with you, if needed. He or she can watch for problems and help keep you safe.  Be sure to go to all follow-up visits with your healthcare provider. And rest after your surgery for as long as your healthcare provider tells you to.  Coping with pain  If you have pain after surgery, pain medicine will help you feel better. Take it as told, before pain becomes severe. Also, ask your healthcare provider or pharmacist about other ways to control pain. This might be with heat, ice, or relaxation. And follow any other instructions your surgeon or nurse gives you.  Tips for taking pain medicine  To get the best relief possible, remember these points:  · Pain medicines can upset your stomach. Taking them with a little food may help.  · Most pain relievers taken by mouth need at least 20 to 30 minutes to start to work.  · Don't wait till your pain becomes severe before you take your medicine. Try to time your medicine so that you can take it before starting an activity. This might be before you get dressed, go for a walk, or sit down for dinner.  · Constipation is a common side effect of pain medicines. Call your healthcare provider before taking any medicines such as laxatives or stool softeners to help ease  constipation. Also ask if you should skip any foods. Drinking lots of fluids and eating foods such as fruits and vegetables that are high in fiber can also help. Remember, don't take laxatives unless your surgeon has prescribed them.  · Drinking alcohol and taking pain medicine can cause dizziness and slow your breathing. It can even be deadly. Don't drink alcohol while taking pain medicine.  · Pain medicine can make you react more slowly to things. Don't drive or run machinery while taking pain medicine.  Your healthcare provider may tell you to take acetaminophen to help ease your pain. Ask him or her how much you are supposed to take each day. Acetaminophen or other pain relievers may interact with your prescription medicines or other over-the-counter (OTC) medicines. Some prescription medicines have acetaminophen and other ingredients. Using both prescription and OTC acetaminophen for pain can cause you to overdose. Read the labels on your OTC medicines with care. This will help you to clearly know the list of ingredients, how much to take, and any warnings. It may also help you not take too much acetaminophen. If you have questions or don't understand the information, ask your pharmacist or healthcare provider to explain it to you before you take the OTC medicine.  Managing nausea  Some people have an upset stomach after surgery. This is often because of anesthesia, pain, or pain medicine, or the stress of surgery. These tips will help you handle nausea and eat   healthy foods as you get better. If you were on a special food plan before surgery, ask your healthcare provider if you should follow it while you get better. These tips may help:  · Don't push yourself to eat. Your body will tell you when to eat and how much.  · Start off with clear liquids and soup. They are easier to digest.  · Next try semi-solid foods, such as mashed potatoes, applesauce, and gelatin, as you feel ready.  · Slowly move to solid  foods. Don’t eat fatty, rich, or spicy foods at first.  · Don't force yourself to have 3 large meals a day. Instead eat smaller amounts more often.  · Take pain medicines with a small amount of solid food, such as crackers or toast, to prevent nausea.  When to call your healthcare provider  Call your healthcare provider if:  · You still have intolerable pain an hour after taking medicine. The medicine may not be strong enough.  · You feel too sleepy, dizzy, or groggy. The medicine may be too strong.  · You have side effects such as nausea or vomiting, or skin changes such as rash, itching, or hives. Your healthcare provider may suggest other medicines to control side effects.  Rash, itching, or hives may mean you have an allergic reaction. Report this right away. If you have trouble breathing or facial swelling, call 911 right away.  If you have obstructive sleep apnea  You were given anesthesia medicine during surgery to keep you comfortable and free of pain. After surgery, you may have more apnea spells because of this medicine and other medicines you were given. The spells may last longer than usual.   At home:  · Keep using the continuous positive airway pressure (CPAP) device when you sleep. Unless your healthcare provider tells you not to, use it when you sleep, day or night. CPAP is a common device used to treat obstructive sleep apnea.  · Talk with your provider before taking any pain medicine, muscle relaxants, or sedatives. Your provider will tell you about the possible dangers of taking these medicines.  [employer] last reviewed this educational content on 02/19/2018  © 2000-2020 [employer] [address], [place] [address]. All rights reserved. This information is not intended as a substitute for professional medical care. Always follow your healthcare professional's instructions.        Anesthesia: Monitored Anesthesia Care (MAC)    You’re due to have surgery. During surgery, you’ll  be given medicine called anesthesia. This will keep you comfortable and pain-free. Your surgeon will use monitored anesthesia care (MAC). This sheet tells you more about this type of anesthesia.   What is monitored anesthesia care?  MAC keeps you very drowsy during surgery. You may be awake, but you will likely not remember much. And you won’t feel pain. With MAC, medicines are given through an IV line into a vein in your arm or hand. A local anesthetic will usually be injected into the skin and muscle around the surgical site to numb it. The anesthesia provider monitors you during the procedure. He or she checks your heart rate and rhythm, blood pressure, and blood oxygen level.   Anesthesia tools and medicines that may be near you during your procedure  You will likely have:  · A pulse oximeter on the end of your finger. This measures your blood oxygen level.  · Electrocardiography leads (electrodes) on your chest. These record your heart rate and rhythm.  ·   Medicines given through an IV. These relax you and prevent pain. You may be awake or sleep lightly. If you have local anesthetic, it's injected directly into your skin.  · A face mask to give you oxygen, if needed.  Possible risks  MAC has some risks. These include:  · Breathing problems  · Nausea and vomiting  · Allergic reaction to the anesthetic   Anesthesia safety  Tips for anesthesia safety include the following:   · Follow all instructions you are given for how long not to eat or drink before your procedure.  · Be sure your healthcare provider knows what medicines you take, especially any anti-inflammatory medicine or blood thinners. This includes aspirin and any other over-the-counter medicines, herbs, and supplements.  · Have an adult family member or friend drive you home after the procedure.  · For the first 24 hours after your surgery:  ? Don't drive or use heavy equipment.  ? Don't make important decisions or sign documents.  ? Don't drink  alcohol.  ? Have someone stay with you, if possible. They can watch for problems and help keep you safe.  [employer] last reviewed this educational content on 09/21/2018  © 2000-2020 [employer] [address], [place] [address]. All rights reserved. This information is not intended as a substitute for professional medical care. Always follow your healthcare professional's instructions.

## 2019-07-22 ENCOUNTER — Encounter (INDEPENDENT_AMBULATORY_CARE_PROVIDER_SITE_OTHER): Payer: Self-pay

## 2019-08-03 ENCOUNTER — Telehealth (INDEPENDENT_AMBULATORY_CARE_PROVIDER_SITE_OTHER): Payer: Medicare Other | Admitting: Student in an Organized Health Care Education/Training Program

## 2019-08-03 ENCOUNTER — Encounter (INDEPENDENT_AMBULATORY_CARE_PROVIDER_SITE_OTHER): Payer: Self-pay | Admitting: Student in an Organized Health Care Education/Training Program

## 2019-08-03 ENCOUNTER — Other Ambulatory Visit (INDEPENDENT_AMBULATORY_CARE_PROVIDER_SITE_OTHER): Payer: Self-pay

## 2019-08-03 VITALS — Ht 63.0 in | Wt 180.0 lb

## 2019-08-03 DIAGNOSIS — R7989 Other specified abnormal findings of blood chemistry: Secondary | ICD-10-CM | POA: Insufficient documentation

## 2019-08-03 DIAGNOSIS — I739 Peripheral vascular disease, unspecified: Secondary | ICD-10-CM

## 2019-08-03 DIAGNOSIS — N186 End stage renal disease: Secondary | ICD-10-CM

## 2019-08-03 DIAGNOSIS — E1142 Type 2 diabetes mellitus with diabetic polyneuropathy: Secondary | ICD-10-CM

## 2019-08-03 DIAGNOSIS — M5440 Lumbago with sciatica, unspecified side: Secondary | ICD-10-CM | POA: Insufficient documentation

## 2019-08-03 DIAGNOSIS — L03115 Cellulitis of right lower limb: Secondary | ICD-10-CM

## 2019-08-03 DIAGNOSIS — M1A3720 Chronic gout due to renal impairment, left ankle and foot, without tophus (tophi): Secondary | ICD-10-CM

## 2019-08-03 MED ORDER — GABAPENTIN 300 MG PO CAPS
600.00 mg | ORAL_CAPSULE | Freq: Three times a day (TID) | ORAL | 0 refills | Status: DC
Start: 2019-08-03 — End: 2019-12-09

## 2019-08-03 MED ORDER — DOXYCYCLINE HYCLATE 100 MG PO CAPS
100.00 mg | ORAL_CAPSULE | Freq: Two times a day (BID) | ORAL | 0 refills | Status: AC
Start: 2019-08-03 — End: 2019-08-13

## 2019-08-03 MED ORDER — CALCIUM ACETATE (PHOS BINDER) 667 MG PO CAPS
667.00 mg | ORAL_CAPSULE | Freq: Three times a day (TID) | ORAL | 0 refills | Status: DC
Start: 2019-08-03 — End: 2019-08-24

## 2019-08-03 NOTE — Progress Notes (Signed)
[facility] [place] [facility]                       Date of Virtual Visit: 08/03/2019 11:39 PM        Patient ID: Mary [name] is a 73 y.o. female.  Attending Physician: Ferol Luz, MD       Telemedicine Eligibility:    State Location:  [x]  [place]  []  [place]  []  [place] of [facility] []  [place]  []  Other:    Physical Location:  [x]  Home  []         []        []          []  Other:    Patient Identity Verification:  [x]  State Issued ID  []  Insurance Eligibility Check  []  Other:    Physical Address Verification: (for 911)  [x]  Yes  []  No    Personal identity shared with patient:  [x]  Yes  []  No    Education on nature of video visit shared with patient:  [x]  Yes  []  No    Emergency plan agreed upon with patient:  [x]  Yes  []  No    If the patient had not had this virtual visit, what would they have done?  []         []         []        []          []  Other:    Visit terminated since not appropriate for virtual care:  [x]  N/A  []  Reason:         Chief Complaint:    Chief Complaint   Patient presents with    leg issue               HPI:    73 y/o female presents today for virtual visit  Seen for virtual visit due to covid-19 pandemic  Telemedicine Documentation Requirements  Originating site (Patient location): home, [place]  Distant site (Provider location): Provider office, [place], [place]  Provider and Title: [place], MD  Consent obtained: Yes    C/o worsening pain in her L leg  Skin is darker, tender to palpation  Some warmth  Sonogram in June was normal  No swelling  Pain with ambulation  Similar symptoms in June, resolved with antibiotic    Reports that she went to [facility] ER on 07/23/19 due to severe foot pain  Was given steroid injection for plantar fasciitis    Podiatrist diagnosed diabetic neuropathy and put her on uloric for possible gout  Taking gabapentin with some relief but pain in her leg, radiating to her foot has been getting worse over time    Getting dialysis 3  times/week  Needs refill of phoslo, out of her medication    Blood sugars have been under good control per pt            Problem List:    Patient Active Problem List   Diagnosis    Cellulitis of left leg    ESRD (end stage renal disease)    DM (diabetes mellitus)    Hypertension    Cellulitis    Chest pain at rest    Viral pericarditis    Dyspnea    Chest pain    Pleural effusion    Chronic renal failure, stage 5    Acute calculous cholecystitis    Acute pain of left knee  Bilateral low back pain with sciatica    Dependence on renal dialysis    Diabetic neuropathy    Dvt femoral (deep venous thrombosis)    Edema    Elevated LFTs    Hyperkalemia    Hyperlipidemia    Hypertensive renal disease    Neuropathy    Primary localized osteoarthritis    Spondylosis of lumbosacral region without myelopathy or radiculopathy             Current Meds:    Outpatient Medications Marked as Taking for the 08/03/19 encounter (Telemedicine Visit) with Ferol Luz, MD   Medication Sig Dispense Refill    acetaminophen (TYLENOL) 500 MG tablet Take 1 tablet (500 mg total) by mouth every 12 (twelve) hours as needed. (Patient taking differently: Take 1,000 mg by mouth every 12 (twelve) hours as needed   ) 30 tablet 0    aspirin 81 MG tablet Take 81 mg by mouth daily.        atorvastatin (LIPITOR) 10 MG tablet Take 10 mg by mouth every morning         famotidine (PEPCID) 20 MG tablet Take 20 mg by mouth daily as needed      Febuxostat (ULORIC) 40 MG tablet Take 40 mg by mouth every morning         gabapentin (NEURONTIN) 300 MG capsule Take 2 capsules (600 mg total) by mouth 3 (three) times daily 540 capsule 0    insulin detemir (LEVEMIR) 100 UNIT/ML injection Inject 10 Units into the skin nightly         insulin lispro (HumaLOG) 100 UNIT/ML injection Inject 5 Units into the skin 3 (three) times daily before meals      midodrine (PROAMATINE) 5 MG tablet Take 10 mg by mouth Once every Monday, Wednesday and  Friday morning Before dialysis      Multiple Vitamin (MULTIVITAMIN PO) Take 1 tablet by mouth daily         SITagliptin (JANUVIA) 25 MG tablet Take 25 mg by mouth every morning      vitamin D (CHOLECALCIFEROL) 25 MCG (1000 UT) tablet Take 1,000 Units by mouth daily      [DISCONTINUED] gabapentin (NEURONTIN) 300 MG capsule Take 300 mg by mouth 3 (three) times daily.            Allergies:    Allergies   Allergen Reactions    Penicillins Hives    Shrimp [Shellfish Allergy] Swelling             Past Surgical History:    Past Surgical History:   Procedure Laterality Date    AV FISTULA PLACEMENT  2012    CHOLECYSTECTOMY      HYSTERECTOMY      INSERTION [facility] Left 07/07/2019    REVISION, A-V FISTULA UPPER EXTREMITY Left 07/21/2019    Procedure: REVISION, A-V FISTULA UPPER EXTREMITY, WITH PATCH ANGIOPLASTY;  Surgeon: [name], MD;  Location: [place] MAIN OR;  Service: Vascular;  Laterality: Left;    THROMBECTOMY Left 07/21/2019    Procedure: THROMBECTOMY;  Surgeon: [name], MD;  [name];  Service: Vascular;  Laterality: Left;           Family History:    Family History   Problem Relation Age of Onset    Diabetes Mother     Cancer Father         throat           Social History:  Social History     Socioeconomic History    Marital status: Widowed     Spouse name: Not on file    Number of children: Not on file    Years of education: Not on file    Highest education level: Not on file   Occupational History    Not on file   Social Needs    Financial resource strain: Not on file    Food insecurity     Worry: Not on file     Inability: Not on file    Transportation needs     Medical: Not on file     Non-medical: Not on file   Tobacco Use    Smoking status: Never Smoker    Smokeless tobacco: Never Used   Substance and Sexual Activity    Alcohol use: No    Drug use: No    Sexual activity: Not on file   Lifestyle    Physical activity     Days per week: Not  on file     Minutes per session: Not on file    Stress: Not on file   Relationships    Social connections     Talks on phone: Not on file     Gets together: Not on file     Attends religious service: Not on file     Active member of club or organization: Not on file     Attends meetings of clubs or organizations: Not on file     Relationship status: Not on file    Intimate partner violence     Fear of current or ex partner: Not on file     Emotionally abused: Not on file     Physically abused: Not on file     Forced sexual activity: Not on file   Other Topics Concern    Not on file   Social History Narrative    Not on file                  Vital Signs:    Ht 1.6 m (5\' 3" )    Wt 81.6 kg (180 lb)    BMI 31.89 kg/m          ROS:    Review of Systems   Constitutional: Positive for fatigue. Negative for chills and fever.   HENT: Positive for hearing loss. Negative for trouble swallowing and voice change.    Eyes: Negative for visual disturbance.   Respiratory: Negative for cough, shortness of breath and wheezing.    Cardiovascular: Negative for chest pain, palpitations and leg swelling.   Gastrointestinal: Negative for abdominal pain, constipation, diarrhea, nausea and vomiting.   Genitourinary: Negative for difficulty urinating, dysuria, frequency and pelvic pain.   Musculoskeletal: Positive for arthralgias.        Leg and foot pain   Neurological: Positive for numbness. Negative for dizziness and headaches.   Psychiatric/Behavioral: Negative for behavioral problems, dysphoric mood and sleep disturbance.              Physical Exam:    Physical Exam  Vitals signs reviewed.   Constitutional:       General: She is not in acute distress.     Appearance: She is not ill-appearing.   Eyes:      General: No scleral icterus.     Extraocular Movements: Extraocular movements intact.      Conjunctiva/sclera: Conjunctivae normal.   Neck:      Musculoskeletal:  Neck supple.   Cardiovascular:      Comments: No cyanosis or edema   No JVD  Pulmonary:      Effort: Pulmonary effort is normal.      Comments: No audible wheezing or stridor  No cough  Speaking in full sentences without difficulty  Musculoskeletal:      Comments: L leg- no visible swelling; erythema at distal aspect; tender to palpation; warm to touch per pt   Neurological:      Mental Status: She is alert and oriented to person, place, and time.      Cranial Nerves: No cranial nerve deficit.   Psychiatric:         Mood and Affect: Mood normal.         Thought Content: Thought content normal.             Assessment:    1. Diabetic polyneuropathy associated with type 2 diabetes mellitus  - gabapentin (NEURONTIN) 300 MG capsule; Take 2 capsules (600 mg total) by mouth 3 (three) times daily  Dispense: 540 capsule; Refill: 0    2. Cellulitis of right lower extremity  - doxycycline (VIBRAMYCIN) 100 MG capsule; Take 1 capsule (100 mg total) by mouth 2 (two) times daily for 10 days  Dispense: 20 capsule; Refill: 0    3. End stage renal disease  - calcium acetate (PHOSLO) 667 MG capsule; Take 1 capsule (667 mg total) by mouth 3 (three) times daily with meals  Dispense: 90 capsule; Refill: 0    4. 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         Ferol Luz, MD

## 2019-08-04 ENCOUNTER — Ambulatory Visit (INDEPENDENT_AMBULATORY_CARE_PROVIDER_SITE_OTHER): Payer: Medicare Other

## 2019-08-04 ENCOUNTER — Encounter (INDEPENDENT_AMBULATORY_CARE_PROVIDER_SITE_OTHER): Payer: Self-pay

## 2019-08-04 DIAGNOSIS — I739 Peripheral vascular disease, unspecified: Secondary | ICD-10-CM

## 2019-08-04 NOTE — Progress Notes (Signed)
:  Room        VASCULAR SURGERY PATIENT FORM       Appt Date/Time:08/09/2019 @ 2:00pm   PCP: Ferol Luz, MD  Referring Physician:    New Patient Follow-Up Post-op X-VIDEO-Possible in person  [phone]  [phone]   [place] [facility] [place] [facility] X 08/13-ABI/Arterial & Venous  & possible hemodialysis duplex Other Imaging     Chief Complaint/HPI: F/U cellulitis RLE. Pt also is S/P LUE AVF revision w/ angio & thrombectomy on 07/21/2019. Date of Last Visit: 06/30/2019   Allergies   Allergen Reactions    Penicillins Hives    Shrimp [Shellfish Allergy] Swelling        Selected Medications:    Coumadin Plavix Eliquis Xarelto Aspirin X Fish Oil Lovenox   Insulin X Metformin Other Diabetes X Januvia Atorvastatin  X Other Cholesterol    MHx:  Stroke/TIA/Seizures Thyroid  Diabetes   Myocardial Infarction Arrhythmia  COPD/Asthma (other lung)   Kidney Disease X Liver Disease DVT X LLE   Wounds Musculoskeletal (spine) Cancer             Smoker           Former Smoker      X    Never Smoker    Vital Signs this Visit Pulses  HT WT HR   Rad Ulnar Brach Fem Pop DP PT       R          BP L BP R SpO2  L                        Imaging results:           Plan of Care:

## 2019-08-07 ENCOUNTER — Other Ambulatory Visit (INDEPENDENT_AMBULATORY_CARE_PROVIDER_SITE_OTHER): Payer: Self-pay | Admitting: Student in an Organized Health Care Education/Training Program

## 2019-08-09 ENCOUNTER — Telehealth (INDEPENDENT_AMBULATORY_CARE_PROVIDER_SITE_OTHER): Payer: Medicare Other | Admitting: Specialist

## 2019-08-11 ENCOUNTER — Encounter (INDEPENDENT_AMBULATORY_CARE_PROVIDER_SITE_OTHER): Payer: Self-pay

## 2019-08-11 NOTE — Progress Notes (Signed)
73 y.o.  female   :Room        VASCULAR SURGERY PATIENT FORM       Appt Date/Time:8/26 220pm    PCP: Ferol Luz, MD  Referring Physician:    New Patient Follow-Up Post-op   [place] [facility]- Hemodialysis @130pm   [place] 8/13 LE testing  Other Imaging     Chief Complaint/HPI: s/p left AVF revision 07/21/2019; also discussing cellulitis                                                     Date of Last Visit:1st post op         Allergies   Allergen Reactions    Penicillins Hives    Shrimp [Shellfish Allergy] Swelling        Selected Medications:    Coumadin Plavix Eliquis Xarelto Aspirin Fish Oil Lovenox   Insulin Metformin Other Diabetes Atorvastatin Pletal Other Cholesterol Pradaxa    MHx:  Stroke/TIA/Seizures Thyroid  Diabetes   Myocardial Infarction Arrhythmia  COPD/Asthma (other lung)   Kidney Disease Liver Disease DVT   Wounds Musculoskeletal (spine) Cancer             Smoker           Former Smoker    x      Never Smoker    Vital Signs this Visit Pulses  HT WT HR   Rad Ulnar Brach Fem Pop DP PT       R          BP L BP R SpO2  L                             TEMP:   Imaging results:           Plan of Care:

## 2019-08-16 ENCOUNTER — Encounter (HOSPITAL_BASED_OUTPATIENT_CLINIC_OR_DEPARTMENT_OTHER): Payer: 59 | Admitting: Specialist

## 2019-08-16 ENCOUNTER — Other Ambulatory Visit (HOSPITAL_BASED_OUTPATIENT_CLINIC_OR_DEPARTMENT_OTHER): Payer: Medicare Other

## 2019-08-17 ENCOUNTER — Ambulatory Visit (INDEPENDENT_AMBULATORY_CARE_PROVIDER_SITE_OTHER): Payer: Medicare Other | Admitting: Specialist

## 2019-08-17 ENCOUNTER — Ambulatory Visit (INDEPENDENT_AMBULATORY_CARE_PROVIDER_SITE_OTHER): Payer: Medicare Other

## 2019-08-17 ENCOUNTER — Encounter (INDEPENDENT_AMBULATORY_CARE_PROVIDER_SITE_OTHER): Payer: Self-pay | Admitting: Specialist

## 2019-08-17 VITALS — BP 107/64 | HR 66 | Temp 98.4°F | Ht 64.0 in | Wt 188.0 lb

## 2019-08-17 DIAGNOSIS — I7025 Atherosclerosis of native arteries of other extremities with ulceration: Secondary | ICD-10-CM

## 2019-08-17 DIAGNOSIS — N185 Chronic kidney disease, stage 5: Secondary | ICD-10-CM

## 2019-08-17 DIAGNOSIS — I70244 Atherosclerosis of native arteries of left leg with ulceration of heel and midfoot: Secondary | ICD-10-CM

## 2019-08-17 DIAGNOSIS — I70234 Atherosclerosis of native arteries of right leg with ulceration of heel and midfoot: Secondary | ICD-10-CM

## 2019-08-17 HISTORY — DX: Atherosclerosis of native arteries of other extremities with ulceration: I70.25

## 2019-08-17 NOTE — Progress Notes (Signed)
[facility]    Chief Complaint   Patient presents with    Chronic Renal Failure, Stage 5     4wk Post-Op. Pt is recovering well, no symptoms.         History of Present Illness     Thank you so muchGladys ASUNA [name] is a 73 y.o. female who presents with history of chronic renal failure left cephalic vein transposition which was created by myself approximately 7 years ago.  Patient underwent revision of the left arm AV fistula.  She has no complaints.  She also states that her ulcer of the left foot is healing.    Past Medical History     Past Medical History:   Diagnosis Date    Abnormal vision     Arthritis     Cataracts, bilateral     Deep venous thrombosis of distal lower extremity ~2013    left leg    End-stage renal disease 2013    Hemodialysis MWF    GERD (gastroesophageal reflux disease)     Hyperlipidemia     Type 2 diabetes mellitus, controlled     Does not check FBS       Past Surgical History     Past Surgical History:   Procedure Laterality Date    AV FISTULA PLACEMENT  2012    CHOLECYSTECTOMY      HYSTERECTOMY      INSERTION [facility] Left 07/07/2019    REVISION, A-V FISTULA UPPER EXTREMITY Left 07/21/2019    Procedure: REVISION, A-V FISTULA UPPER EXTREMITY, WITH PATCH ANGIOPLASTY;  Surgeon: [name], MD;  Location: [place] [facility] OR;  Service: Vascular;  Laterality: Left;    THROMBECTOMY Left 07/21/2019    Procedure: THROMBECTOMY;  Surgeon: [name], MD;  [name];  Service: Vascular;  Laterality: Left;       Family History     Family History   Problem Relation Age of Onset    Diabetes Mother     Cancer Father         throat       Social History     Social History     Socioeconomic History    Marital status: Widowed     Spouse name: Not on file    Number of children: Not on file    Years of education: Not on file    Highest education level: Not on file   Occupational History    Not on file   Social Needs    Financial resource  strain: Not on file    Food insecurity     Worry: Not on file     Inability: Not on file    Transportation needs     Medical: Not on file     Non-medical: Not on file   Tobacco Use    Smoking status: Never Smoker    Smokeless tobacco: Never Used   Substance and Sexual Activity    Alcohol use: No    Drug use: Never    Sexual activity: Not on file   Lifestyle    Physical activity     Days per week: Not on file     Minutes per session: Not on file    Stress: Not on file   Relationships    Social connections     Talks on phone: Not on file     Gets together: Not on file     Attends religious service: Not  on file     Active member of club or organization: Not on file     Attends meetings of clubs or organizations: Not on file     Relationship status: Not on file    Intimate partner violence     Fear of current or ex partner: Not on file     Emotionally abused: Not on file     Physically abused: Not on file     Forced sexual activity: Not on file   Other Topics Concern    Not on file   Social History Narrative    Not on file       Allergies     Allergies   Allergen Reactions    Penicillins Hives    Shrimp [Shellfish Allergy] Swelling       Medications     Current Outpatient Medications on File Prior to Visit   Medication Sig Dispense Refill    Accu-Chek FastClix Lancets Misc USE TO CHECK BLOOD SUGARS THREE TIMES DAILY 300 each 3    acetaminophen (TYLENOL) 500 MG tablet Take 1 tablet (500 mg total) by mouth every 12 (twelve) hours as needed. (Patient taking differently: Take 1,000 mg by mouth every 12 (twelve) hours as needed   ) 30 tablet 0    aspirin 81 MG tablet Take 81 mg by mouth daily.        atorvastatin (LIPITOR) 10 MG tablet Take 10 mg by mouth every morning         calcium acetate (PHOSLO) 667 MG capsule Take 1 capsule (667 mg total) by mouth 3 (three) times daily with meals 90 capsule 0    ciprofloxacin (CIPRO) 500 MG tablet Take 500 mg by mouth 2 (two) times daily      famotidine (PEPCID)  20 MG tablet Take 20 mg by mouth daily as needed      Febuxostat (ULORIC) 40 MG tablet Take 40 mg by mouth every morning         gabapentin (NEURONTIN) 300 MG capsule Take 2 capsules (600 mg total) by mouth 3 (three) times daily 540 capsule 0    indomethacin (INDOCIN SR) 75 MG CR capsule Take 1 capsule by mouth daily      insulin detemir (LEVEMIR) 100 UNIT/ML injection Inject 10 Units into the skin nightly         insulin lispro (HumaLOG) 100 UNIT/ML injection Inject 5 Units into the skin 3 (three) times daily before meals      midodrine (PROAMATINE) 5 MG tablet Take 10 mg by mouth Once every Monday, Wednesday and Friday morning Before dialysis      Multiple Vitamin (MULTIVITAMIN PO) Take 1 tablet by mouth daily         oxyCODONE-acetaminophen (Percocet) 5-325 MG per tablet Take 1 tablet by mouth every 4 (four) hours as needed for Pain 20 tablet 0    SITagliptin (JANUVIA) 25 MG tablet Take 25 mg by mouth every morning      vitamin D (CHOLECALCIFEROL) 25 MCG (1000 UT) tablet Take 1,000 Units by mouth daily       No current facility-administered medications on file prior to visit.        Review of Systems     Constitutional: Negative for fevers and chills  Skin: No rash or lesions  Respiratory: Negative for cough, wheezing, or hemoptysis  Cardiovascular: as per HPI  Gastrointestinal: Negative for abdominal pain, nausea, vomiting and diarrhea  Musculoskeletal:  No arthritic symptoms  Genitourinary: Negative for  dysuria  All other systems were reviewed and are negative except what is stated in the HPI      Physical Exam     Vitals:    08/17/19 1406   BP: 107/64   Pulse: 66   Temp: 98.4 F (36.9 C)   SpO2: 100%       Body mass index is 32.27 kg/m.    General:  Patient appears their stated age, well-nourished.  Alert and in no apparent distress.  Lungs: Respiratory effort unlabored, chest expansion symmetric.  Cardiac: RRR,  no JVD.   Extremities warm,  Abd: Soft, nondistended, nontender.   VOH:YWVP ROM in all  4 extremities, symmetric partially thrombosed left brachiocephalic AV fistula  Skin: Color appropriate for race, Skin warm, dry, no gangrene, no non healing ulcers, no varicose veins , no hyperpigmentation, no lipo-dermatosclerosis  Neuro: Good insight and judgment, oriented to person, place, and time CN II-XII intact, gross motor and sensory intact     Labs     CBC:   WBC   Date/Time Value Ref Range Status   11/11/2013 05:13 PM 6.19 3.50 - 10.80 Final     RBC   Date/Time Value Ref Range Status   11/11/2013 05:13 PM 3.86 (L) 4.20 - 5.40 Final     Hgb   Date/Time Value Ref Range Status   11/11/2013 05:13 PM 11.5 (L) 12.0 - 16.0 g/dL Final     Hematocrit   Date/Time Value Ref Range Status   11/11/2013 05:13 PM 34.8 (L) 37.0 - 47.0 % Final     MCV   Date/Time Value Ref Range Status   11/11/2013 05:13 PM 90.2 80.0 - 100.0 fL Final     MCHC   Date/Time Value Ref Range Status   11/11/2013 05:13 PM 33.0 32.0 - 36.0 g/dL Final     RDW   Date/Time Value Ref Range Status   11/11/2013 05:13 PM 17 (H) 12 - 15 % Final     Platelets   Date/Time Value Ref Range Status   11/11/2013 05:13 PM 183 140 - 400 Final       CMP:   Sodium   Date/Time Value Ref Range Status   02/20/2014 04:37 PM 141 136 - 145 mEq/L Final     Potassium   Date/Time Value Ref Range Status   02/20/2014 04:37 PM 4.5 3.5 - 5.1 mEq/L Final     Chloride   Date/Time Value Ref Range Status   02/20/2014 04:37 PM 100 98 - 107 mEq/L Final     CO2   Date/Time Value Ref Range Status   02/20/2014 04:37 PM 23 22 - 29 mEq/L Final     Glucose   Date/Time Value Ref Range Status   02/20/2014 04:37 PM 194 (H) 70 - 100 mg/dL Final     Comment:     Specimen moderately lipemic. ER  Interpretive Data for Adult Female and Female Population  Indeterminate Range:  100-125 mg/dL  Equal to or greater than 126 mg/dL meets the ADA  guidelines for Diabetes Mellitus diagnosis if symptoms  are present and confirmed by repeat testing.  Random (Non-Fasting)Interpretive Data (Adults):  Equal to or  greater than 200 mg/dL meets the ADA  guidelines for Diabetes Mellitus diagnosis if symptoms  are present and confirmed by Fasting Glucose or GTT.     BUN   Date/Time Value Ref Range Status   02/20/2014 04:37 PM 75 (H) 7 - 19 mg/dL Final     Protein, Total  Date/Time Value Ref Range Status   08/15/2013 09:25 AM 6.1 6.0 - 8.3 g/dL Final     Alkaline Phosphatase   Date/Time Value Ref Range Status   08/15/2013 09:25 AM 72 40 - 150 U/L Final     AST (SGOT)   Date/Time Value Ref Range Status   08/15/2013 09:25 AM 15 5 - 34 U/L Final     ALT   Date/Time Value Ref Range Status   08/15/2013 09:25 AM 21 0 - 55 U/L Final     Anion Gap   Date/Time Value Ref Range Status   02/20/2014 04:37 PM 18.0 (H) 5.0 - 15.0 Final       Lipid Panel No results found for: CHOL, TRIG, HDL    Coags:   PT   Date/Time Value Ref Range Status   02/20/2014 04:37 PM 25.5 (H) 12.6 - 15.0 sec Final     PT INR   Date/Time Value Ref Range Status   02/20/2014 04:37 PM 2.4 (H) 0.9 - 1.1 Final     Comment:     Recommended Ranges for Protime INR:    2.0-3.0 for most medical and surgical thromboembolic states    8.1-1.0 for artificial heart valves  INR result may not represent exact Warfarin dosing level during  the transition period from Heparin to Warfarin therapy.  Recommend close clinical monitoring.     PTT   Date/Time Value Ref Range Status   11/11/2013 05:13 PM 36 23 - 37 Final         Diagnostic Imaging     I have reviewed and interpreted the vascular diagnostic imaging with the patient    Assessment and Plan       1. Chronic renal failure, stage 5     2. 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 female who presents with history of chronic renal failure left cephalic vein transposition which was created by myself approximately 7 years ago.  Patient underwent revision of the left arm AV fistula.  She has no complaints.  She also states that her ulcer of the left  foot is healing.  The ultrasound has revealed well matured left upper arm AV fistula and I have given her permission to proceed with the use of the fistula.  The arterial duplex suggests some calcification throughout the lower extremities without significant major channel occlusive disease therefore hopefully with local care the very small ulcer of the left shoulder should heal.  I will be seeing her back in the office for routine follow-up in 3 months.      Dr. Mina Marble I would like to thank you for allowing me to participate in the care of this patient.    Best regards    Shary Key, MD, FACS, [facility]  Chief, Section of Vascular Surgery  [facility]        Dr. Mina Marble I would like to thank you for allowing me to participate in the care of this patient.    Best regards    Shary Key, MD, [place], [facility]  Chief, Section of Vascular [facility]

## 2019-08-24 ENCOUNTER — Other Ambulatory Visit (INDEPENDENT_AMBULATORY_CARE_PROVIDER_SITE_OTHER): Payer: Self-pay | Admitting: Student in an Organized Health Care Education/Training Program

## 2019-08-24 DIAGNOSIS — N186 End stage renal disease: Secondary | ICD-10-CM

## 2019-10-10 ENCOUNTER — Other Ambulatory Visit (INDEPENDENT_AMBULATORY_CARE_PROVIDER_SITE_OTHER): Payer: Self-pay | Admitting: Student in an Organized Health Care Education/Training Program

## 2019-10-10 DIAGNOSIS — N186 End stage renal disease: Secondary | ICD-10-CM

## 2019-10-11 ENCOUNTER — Telehealth (INDEPENDENT_AMBULATORY_CARE_PROVIDER_SITE_OTHER): Payer: Medicare Other | Admitting: Physician Assistant

## 2019-10-11 DIAGNOSIS — Z992 Dependence on renal dialysis: Secondary | ICD-10-CM

## 2019-10-11 DIAGNOSIS — E782 Mixed hyperlipidemia: Secondary | ICD-10-CM

## 2019-10-11 DIAGNOSIS — E1121 Type 2 diabetes mellitus with diabetic nephropathy: Secondary | ICD-10-CM

## 2019-10-11 DIAGNOSIS — R21 Rash and other nonspecific skin eruption: Secondary | ICD-10-CM

## 2019-10-11 DIAGNOSIS — N186 End stage renal disease: Secondary | ICD-10-CM

## 2019-10-11 MED ORDER — ATORVASTATIN CALCIUM 10 MG PO TABS
10.0000 mg | ORAL_TABLET | Freq: Every morning | ORAL | 0 refills | Status: DC
Start: 2019-10-11 — End: 2020-01-10

## 2019-10-11 MED ORDER — NYSTATIN 100000 UNIT/GM EX OINT
TOPICAL_OINTMENT | CUTANEOUS | 0 refills | Status: DC
Start: 2019-10-11 — End: 2020-09-18

## 2019-10-11 MED ORDER — CALCIUM ACETATE (PHOS BINDER) 667 MG PO CAPS
ORAL_CAPSULE | ORAL | 0 refills | Status: DC
Start: 2019-10-11 — End: 2020-12-27

## 2019-10-11 NOTE — Progress Notes (Addendum)
[place]                       Date of Virtual Visit: 10/11/2019 1:39 PM        Patient ID: Mary [name] is a 73 y.o. female.  Attending Physician: Gilford Silvius, PA       Telemedicine Eligibility:    State Location:  [x]  [place]  []  [place]  []  [place] [facility] []  [place]  []  Other:    Physical Location:  [x]  Home  []         []        []          []  Other:    Patient Identity Verification:  [x]  State Issued ID  []  Insurance Eligibility Check  []  Other:    Physical Address Verification: (for 911)  [x]  Yes  []  No    Personal identity shared with patient:  [x]  Yes  []  No    Education on nature of video visit shared with patient:  [x]  Yes  []  No    Emergency plan agreed upon with patient:  [x]  Yes  []  No    If the patient had not had this virtual visit, what would they have done?  []         []         []        []          []  Other:    Visit terminated since not appropriate for virtual care:  [x]  N/A  []  Reason:         Chief Complaint:    Chief Complaint   Patient presents with    Medication Refill               HPI:    1. Renal dz - pt gets dialysis 3 times/week.  She states that doctor will see her tomorrow at this visit.  She continues to have them monitor labs and will need refill of calcium.    2. DM/lipids - a1c checked a few months ago and 6.2.  She'll need refill of lipitor.    3. Pt co rash of the perianal region that is itchy/irritated.  She's used nystatin in the past with good results.    4. She already had flu shot.            Problem List:    Patient Active Problem List   Diagnosis    Cellulitis of left leg    ESRD (end stage renal disease)    DM (diabetes mellitus)    Hypertension    Cellulitis    Chest pain at rest    Viral pericarditis    Dyspnea    Chest pain    Pleural effusion    Chronic renal failure, stage 5    Acute calculous cholecystitis    Acute pain of left knee    Bilateral low back pain with sciatica     Dependence on renal dialysis    Diabetic neuropathy    Dvt femoral (deep venous thrombosis)    Edema    Elevated LFTs    Hyperkalemia    Hyperlipidemia    Hypertensive renal disease    Neuropathy    Primary localized osteoarthritis    Spondylosis of lumbosacral region without myelopathy or radiculopathy    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          Allergies:    Allergies   Allergen Reactions    Penicillins Hives    Shrimp [Shellfish Allergy] Swelling             Past Surgical History:    Past Surgical History:   Procedure Laterality Date    AV FISTULA PLACEMENT  2012    CHOLECYSTECTOMY      HYSTERECTOMY      INSERTION [facility] Left 07/07/2019    REVISION, A-V FISTULA UPPER EXTREMITY Left 07/21/2019    Procedure: REVISION, A-V FISTULA UPPER EXTREMITY, WITH PATCH ANGIOPLASTY;  Surgeon: [name], MD;  Location: [facility];  Service: Vascular;  Laterality: Left;    THROMBECTOMY Left 07/21/2019    Procedure: THROMBECTOMY;  Surgeon: [name], MD;  [name];  Service: Vascular;  Laterality: Left;           Family History:    Family History   Problem Relation Age of Onset    Diabetes Mother     Cancer Father         throat           Social History:    Social History     Tobacco Use    Smoking status: Never Smoker    Smokeless tobacco: Never Used   Substance Use Topics    Alcohol use: No    Drug use: Never          The following sections were reviewed this encounter by the provider:   Allergies   Meds   Problems              Vital Signs:    There were no vitals taken for this visit.         ROS:    Review of Systems   Constitutional: Negative for chills, fatigue and fever.   Respiratory: Negative for cough, chest tightness, shortness of breath and  wheezing.    Cardiovascular: Negative for chest pain, palpitations and leg swelling.   Gastrointestinal: Negative for abdominal pain, diarrhea and vomiting.   Skin: Positive for rash.   Neurological: Negative for dizziness, syncope and numbness.              Physical Exam:    Physical Exam   GENERAL APPEARANCE: alert, in no acute distress, pleasant, well nourished.   HEAD: normal appearance  EYES: no discharge  EARS: normal hearing  NECK/THYROID: appearance -supple  PSYCH: appropriate affect, appropriate mood, normal speech, normal attention        Assessment:    1. End stage renal disease  - calcium acetate (PHOSLO) 667 MG capsule; TAKE 1 CAPSULE(667 MG) BY MOUTH THREE TIMES DAILY WITH MEALS  Dispense: 90 capsule; Refill: 0    2. Type 2 diabetes mellitus with diabetic nephropathy, unspecified whether long term insulin use  - atorvastatin (LIPITOR) 10 MG tablet; Take 1 tablet (10 mg total) by mouth every morning  Dispense: 90 tablet; Refill: 0    3. Perianal rash  - nystatin (MYCOSTATIN) ointment; APPLY TWICE DAILY X 10 DAYS  Dispense: 30 g; Refill: 0    4. Dependence on renal dialysis    5. Mixed hyperlipidemia            Plan:    Continue current meds and f/u  as directed for dialysis and with nephrology.  Use ointment x 7-10 days, call or f/u for ov if sx worsening or needing in person evaluation of the area.            Follow-up:    No follow-ups on file.         Gilford Silvius, [place]

## 2019-10-31 ENCOUNTER — Encounter (INDEPENDENT_AMBULATORY_CARE_PROVIDER_SITE_OTHER): Payer: Self-pay

## 2019-11-01 ENCOUNTER — Other Ambulatory Visit (INDEPENDENT_AMBULATORY_CARE_PROVIDER_SITE_OTHER): Payer: Medicare Other

## 2019-11-01 ENCOUNTER — Ambulatory Visit (INDEPENDENT_AMBULATORY_CARE_PROVIDER_SITE_OTHER): Payer: Medicare Other

## 2019-11-01 DIAGNOSIS — I739 Peripheral vascular disease, unspecified: Secondary | ICD-10-CM

## 2019-11-02 ENCOUNTER — Telehealth (INDEPENDENT_AMBULATORY_CARE_PROVIDER_SITE_OTHER): Payer: Self-pay | Admitting: Student in an Organized Health Care Education/Training Program

## 2019-11-02 NOTE — Telephone Encounter (Signed)
Patient called requesting to speak with Dr. Dema Severin.  She was given some cream that is no longer working; the doctor knows all about it and she wants a call from Dr. Dema Severin.  Please call [phone]

## 2019-11-03 NOTE — Telephone Encounter (Signed)
FYI . Pt scheduled in office visit on 11/05/19 9am for re-eval of rash per last visit Di Kindle stated need in person eval or VV if getting worse or not better.

## 2019-11-05 ENCOUNTER — Ambulatory Visit (INDEPENDENT_AMBULATORY_CARE_PROVIDER_SITE_OTHER): Payer: Medicare Other | Admitting: Student in an Organized Health Care Education/Training Program

## 2019-11-05 ENCOUNTER — Encounter (INDEPENDENT_AMBULATORY_CARE_PROVIDER_SITE_OTHER): Payer: Self-pay | Admitting: Student in an Organized Health Care Education/Training Program

## 2019-11-05 VITALS — BP 108/70 | HR 72 | Temp 97.2°F | Resp 16 | Ht 63.0 in | Wt 183.0 lb

## 2019-11-05 DIAGNOSIS — E1121 Type 2 diabetes mellitus with diabetic nephropathy: Secondary | ICD-10-CM

## 2019-11-05 DIAGNOSIS — R238 Other skin changes: Secondary | ICD-10-CM

## 2019-11-05 DIAGNOSIS — N186 End stage renal disease: Secondary | ICD-10-CM

## 2019-11-05 MED ORDER — INSULIN DETEMIR 100 UNIT/ML SC SOLN
10.00 [IU] | Freq: Every evening | SUBCUTANEOUS | 1 refills | Status: DC
Start: 2019-11-05 — End: 2020-06-12

## 2019-11-05 MED ORDER — TRIAMCINOLONE ACETONIDE 0.1 % EX CREA
TOPICAL_CREAM | CUTANEOUS | 1 refills | Status: DC
Start: 2019-11-05 — End: 2020-02-20

## 2019-11-05 NOTE — Progress Notes (Signed)
Subjective:      Patient ID: Mary [name] is a 73 y.o. female.    Chief Complaint:  Chief Complaint   Patient presents with    Rash       HPI:  Pt presents today for rash on the top of buttock  Pt states her rash is itchy, not painful  Pt states hard to see on her back.   Pt states she had used a cream Nystatin Ointment without improvement    DM- needs refill of levemir  Taking 10 units at night  Taking humalog 5 units 3 times daily    End stage renal disease- dialysis 3 times/week  Labs monitored by nephrology  a1c in July was 6.2  Blood sugars have been 150s when checked at home  No hypoglycemia        Problem List:  Patient Active Problem List   Diagnosis    Cellulitis of left leg    ESRD (end stage renal disease)    DM (diabetes mellitus)    Hypertension    Cellulitis    Chest pain at rest    Viral pericarditis    Dyspnea    Chest pain    Pleural effusion    Chronic renal failure, stage 5    Acute calculous cholecystitis    Acute pain of left knee    Bilateral low back pain with sciatica    Dependence on renal dialysis    Diabetic neuropathy    Dvt femoral (deep venous thrombosis)    Edema    Elevated LFTs    Hyperkalemia    Hyperlipidemia    Hypertensive renal disease    Neuropathy    Primary localized osteoarthritis    Spondylosis of lumbosacral region without myelopathy or radiculopathy    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 (Patient taking differently: Take 1,000 mg by mouth every 12 (twelve) hours as needed   ) 30 tablet 0    aspirin 81 MG tablet Take 81 mg by mouth daily.        atorvastatin (LIPITOR) 10 MG tablet Take  1 tablet (10 mg total) by mouth every morning 90 tablet 0    BD Pen Needle Nano 2nd Gen 32G X 4 MM Misc USE WITH INSULIN PEN TID      calcium acetate (PHOSLO) 667 MG capsule TAKE 1 CAPSULE(667 MG) BY MOUTH THREE TIMES DAILY WITH MEALS 90 capsule 0    ciprofloxacin (CIPRO) 500 MG tablet Take 500 mg by mouth 2 (two) times daily      Colchicine 0.6 MG Cap TK 1 C PO BID PRN FOR GOUT      colchicine 0.6 MG tablet TK 1 T PO  BID PRF GOUT      famotidine (PEPCID) 20 MG tablet Take 20 mg by mouth daily as needed      Febuxostat (ULORIC) 40 MG tablet Take 40 mg by mouth every morning         gabapentin (NEURONTIN) 300 MG capsule Take 2 capsules (600 mg total) by mouth 3 (three) times daily 540  capsule 0    indomethacin (INDOCIN SR) 75 MG CR capsule Take 1 capsule by mouth daily      insulin detemir (LEVEMIR) 100 UNIT/ML injection Inject 10 Units into the skin nightly 3 pen 1    insulin lispro (HumaLOG) 100 UNIT/ML injection Inject 5 Units into the skin 3 (three) times daily before meals      midodrine (PROAMATINE) 5 MG tablet Take 10 mg by mouth Once every Monday, Wednesday and Friday morning Before dialysis      Multiple Vitamin (MULTIVITAMIN PO) Take 1 tablet by mouth daily         nystatin (MYCOSTATIN) ointment APPLY TWICE DAILY X 10 DAYS 30 g 0    oxyCODONE-acetaminophen (Percocet) 5-325 MG per tablet Take 1 tablet by mouth every 4 (four) hours as needed for Pain 20 tablet 0    SITagliptin (JANUVIA) 25 MG tablet Take 25 mg by mouth every morning      triamcinolone (KENALOG) 0.1 % cream       triamcinolone (KENALOG) 0.1 % cream Apply to affected area twice daily 80 g 1    vitamin D (CHOLECALCIFEROL) 25 MCG (1000 UT) tablet Take 1,000 Units by mouth daily       No current facility-administered medications for this visit.        Allergies:  Allergies   Allergen Reactions    Penicillins Hives    Shrimp [Shellfish Allergy] Swelling       Past Medical History:  Past Medical History:   Diagnosis Date     Abnormal vision     Arthritis     Cataracts, bilateral     Deep venous thrombosis of distal lower extremity ~2013    left leg    End-stage renal disease 2013    Hemodialysis MWF    GERD (gastroesophageal reflux disease)     Hyperlipidemia     Type 2 diabetes mellitus, controlled     Does not check FBS       Past Surgical History:  Past Surgical History:   Procedure Laterality Date    AV FISTULA PLACEMENT  2012    CHOLECYSTECTOMY      HYSTERECTOMY      INSERTION [facility] Left 07/07/2019    REVISION, A-V FISTULA UPPER EXTREMITY Left 07/21/2019    Procedure: REVISION, A-V FISTULA UPPER EXTREMITY, WITH PATCH ANGIOPLASTY;  Surgeon: [name], MD;  Location: [facility];  Service: Vascular;  Laterality: Left;    THROMBECTOMY Left 07/21/2019    Procedure: THROMBECTOMY;  Surgeon: [name], MD;  [facility]: [name];  Service: Vascular;  Laterality: Left;       Family History:  Family History   Problem Relation Age of Onset    Diabetes Mother     Cancer Father         throat       Social History:  Social History     Socioeconomic History    Marital status: Widowed     Spouse name: Not on file    Number of children: Not on file    Years of education: Not on file    Highest education level: Not on file   Occupational History    Not on file   Social Needs    Financial resource strain: Not on file    Food insecurity     Worry: Not on file     Inability: Not on file    Transportation needs  Medical: Not on file     Non-medical: Not on file   Tobacco Use    Smoking status: Never Smoker    Smokeless tobacco: Never Used   Substance and Sexual Activity    Alcohol use: No    Drug use: Never    Sexual activity: Not on file   Lifestyle    Physical activity     Days per week: Not on file     Minutes per session: Not on file    Stress: Not on file   Relationships    Social connections     Talks on phone: Not on file     Gets together: Not on file     Attends religious  service: Not on file     Active member of club or organization: Not on file     Attends meetings of clubs or organizations: Not on file     Relationship status: Not on file    Intimate partner violence     Fear of current or ex partner: Not on file     Emotionally abused: Not on file     Physically abused: Not on file     Forced sexual activity: Not on file   Other Topics Concern    Not on file   Social History Narrative    Not on file       The following sections were reviewed this encounter by the provider:   Tobacco   Allergies   Meds   Problems   Med Hx   Surg Hx   Fam Hx          ROS:  Review of Systems   Constitutional: Positive for fatigue. Negative for chills and fever.   HENT: Positive for hearing loss. Negative for trouble swallowing and voice change.    Eyes: Negative for visual disturbance.   Respiratory: Negative for cough, shortness of breath and wheezing.    Cardiovascular: Negative for chest pain, palpitations and leg swelling.   Gastrointestinal: Negative for abdominal pain, constipation, diarrhea, nausea and vomiting.   Genitourinary: Negative for pelvic pain.   Musculoskeletal: Positive for arthralgias.        Leg and foot pain   Skin: Positive for rash.   Neurological: Positive for numbness (neuropathy stable). Negative for dizziness and headaches.   Psychiatric/Behavioral: Negative for behavioral problems, dysphoric mood and sleep disturbance.       Vitals:  BP 108/70 (BP Site: Right arm, Patient Position: Sitting, Cuff Size: Medium)    Pulse 72    Temp 97.2 F (36.2 C)    Resp 16    Ht 1.6 m (5\' 3" )    Wt 83 kg (183 lb)    BMI 32.42 kg/m      Objective:     Physical Exam:  Physical Exam  Vitals signs reviewed.   Constitutional:       General: She is not in acute distress.     Appearance: She is not ill-appearing.   HENT:      Ears:      Comments: Hard of hearing  Eyes:      General: No scleral icterus.     Extraocular Movements: Extraocular movements intact.      Conjunctiva/sclera:  Conjunctivae normal.   Neck:      Musculoskeletal: Neck supple.   Cardiovascular:      Rate and Rhythm: Normal rate and regular rhythm.      Heart sounds: No murmur.  Comments: No cyanosis or edema  No JVD  Pulmonary:      Effort: Pulmonary effort is normal. No respiratory distress.      Breath sounds: Normal breath sounds. No wheezing or rhonchi.      Comments: No audible wheezing or stridor  No cough  Speaking in full sentences without difficulty  Genitourinary:     Comments: No visible lesions or skin changes  Skin:     Findings: Rash (erythematous patch of healed vesicles superior to gluteal cleft; no surrounding erythema) present.   Neurological:      Mental Status: She is alert and oriented to person, place, and time.      Cranial Nerves: No cranial nerve deficit.   Psychiatric:         Mood and Affect: Mood normal.         Thought Content: Thought content normal.          Assessment:     1. Vesicular rash  - triamcinolone (KENALOG) 0.1 % cream; Apply to affected area twice daily  Dispense: 80 g; Refill: 1    2. End stage renal disease    3. Type 2 diabetes with nephropathy  - insulin detemir (LEVEMIR) 100 UNIT/ML injection; Inject 10 Units into the skin nightly  Dispense: 3 pen; Refill: 1      Plan:     Suspect rash was likely shingles  As it has been present for a month, no benefit for antiviral at this point  Will apply topical steroid to lesions that bother her  Refer to derm if no improvement  Will obtain recent labs from nephrology    Ferol Luz, MD

## 2019-11-14 ENCOUNTER — Encounter (INDEPENDENT_AMBULATORY_CARE_PROVIDER_SITE_OTHER): Payer: Self-pay

## 2019-11-14 NOTE — Progress Notes (Signed)
VASCULAR SURGERY PATIENT FORM                             Appt Date/Time:   11-16-2019@1350          PCP: Ferol Luz, MD     Referring Physician:    New_Patient Follow_Up X  Post_op   US_Today US_[facility] X  Hemodialysis 11-24 Other_Imaging      Chief Complaint/HPI:    - s/p left arm AVF done on 07-30. 3 mos f/u to review U/S result                                                                                                                        Date of Last Visit: 08-17-2019     Allergies   Allergen Reactions    Penicillins Hives    Shrimp [Shellfish Allergy] Swelling         Selected Medications:    Coumadin Plavix Eliquis Xarelto Aspirin Fish_Oil Lovenox   Insulin Metformin Other_Diabetes Atorvastatin  Pletal Other_Cholesterol-Nystatin     MHx:  Stroke / TIA / Seizures Thyroid  Diabetes   Myocardial_Infarction Arrhythmia  COPD / Asthma (other lung)   Kidney_Disease Liver Disease DVT   Wounds Musculoskeletal (spine) Cancer       reports that she has never smoked. She has never used smokeless tobacco.     Vital Signs this Visit                                                 Pulses  HT WT TEMP     Rad Ulnar Brach Fem Pop DP PT           L                 BP L BP R SpO2      R                                              HR L HR R                       Imaging results:              Plan of Care:

## 2019-11-15 ENCOUNTER — Ambulatory Visit (INDEPENDENT_AMBULATORY_CARE_PROVIDER_SITE_OTHER): Payer: Medicare Other

## 2019-11-15 DIAGNOSIS — N185 Chronic kidney disease, stage 5: Secondary | ICD-10-CM

## 2019-11-16 ENCOUNTER — Ambulatory Visit (INDEPENDENT_AMBULATORY_CARE_PROVIDER_SITE_OTHER): Payer: Medicare Other | Admitting: Specialist

## 2019-11-16 ENCOUNTER — Encounter (INDEPENDENT_AMBULATORY_CARE_PROVIDER_SITE_OTHER): Payer: Self-pay | Admitting: Specialist

## 2019-11-16 VITALS — BP 117/64 | HR 83 | Temp 97.6°F | Ht 62.0 in | Wt 184.0 lb

## 2019-11-16 DIAGNOSIS — N185 Chronic kidney disease, stage 5: Secondary | ICD-10-CM

## 2019-11-16 NOTE — Progress Notes (Signed)
[facility]    Chief Complaint   Patient presents with    Follow-up     L FISTULA CHECK         History of Present Illness     BRYLA Mary [name] is a 73 y.o. female who presents for routine follow-up of left arm AV fistula which was revised earlier this year.  Patient has no complaints and has undergone follow-up duplex ultrasound examination.    Past Medical History     Past Medical History:   Diagnosis Date    Abnormal vision     Arthritis     Cataracts, bilateral     Deep venous thrombosis of distal lower extremity ~2013    left leg    End-stage renal disease 2013    Hemodialysis MWF    GERD (gastroesophageal reflux disease)     Hyperlipidemia     Type 2 diabetes mellitus, controlled     Does not check FBS       Allergies     Allergies   Allergen Reactions    Penicillins Hives    Shrimp [Shellfish Allergy] Swelling       Medications     Current Outpatient Medications on File Prior to Visit   Medication Sig Dispense Refill    Accu-Chek FastClix Lancets Misc USE TO CHECK BLOOD SUGARS THREE TIMES DAILY 300 each 3    Accu-Chek Guide test strip U TID      acetaminophen (TYLENOL) 500 MG tablet Take 1 tablet (500 mg total) by mouth every 12 (twelve) hours as needed. (Patient taking differently: Take 1,000 mg by mouth every 12 (twelve) hours as needed   ) 30 tablet 0    aspirin 81 MG tablet Take 81 mg by mouth daily.        atorvastatin (LIPITOR) 10 MG tablet Take 1 tablet (10 mg total) by mouth every morning 90 tablet 0    BD Pen Needle Nano 2nd Gen 32G X 4 MM Misc USE WITH INSULIN PEN TID      calcium acetate (PHOSLO) 667 MG capsule TAKE 1 CAPSULE(667 MG) BY MOUTH THREE TIMES DAILY WITH MEALS 90 capsule 0    ciprofloxacin (CIPRO) 500 MG tablet Take 500 mg by mouth 2 (two) times daily      Colchicine 0.6 MG Cap TK 1 C PO BID PRN FOR GOUT      colchicine 0.6 MG tablet TK 1 T PO  BID PRF GOUT      famotidine (PEPCID) 20 MG tablet Take 20 mg by mouth daily as needed      Febuxostat (ULORIC)  40 MG tablet Take 40 mg by mouth every morning         gabapentin (NEURONTIN) 300 MG capsule Take 2 capsules (600 mg total) by mouth 3 (three) times daily 540 capsule 0    indomethacin (INDOCIN SR) 75 MG CR capsule Take 1 capsule by mouth daily      insulin detemir (LEVEMIR) 100 UNIT/ML injection Inject 10 Units into the skin nightly 3 pen 1    insulin lispro (HumaLOG) 100 UNIT/ML injection Inject 5 Units into the skin 3 (three) times daily before meals      midodrine (PROAMATINE) 5 MG tablet Take 10 mg by mouth Once every Monday, Wednesday and Friday morning Before dialysis      Multiple Vitamin (MULTIVITAMIN PO) Take 1 tablet by mouth daily         nystatin (MYCOSTATIN) ointment APPLY TWICE DAILY X  10 DAYS 30 g 0    oxyCODONE-acetaminophen (Percocet) 5-325 MG per tablet Take 1 tablet by mouth every 4 (four) hours as needed for Pain 20 tablet 0    SITagliptin (JANUVIA) 25 MG tablet Take 25 mg by mouth every morning      triamcinolone (KENALOG) 0.1 % cream       triamcinolone (KENALOG) 0.1 % cream Apply to affected area twice daily 80 g 1    vitamin D (CHOLECALCIFEROL) 25 MCG (1000 UT) tablet Take 1,000 Units by mouth daily       No current facility-administered medications on file prior to visit.        Review of Systems     Constitutional: Negative for fevers and chills  Skin: No rash or lesions  Respiratory: Negative for cough, wheezing, or hemoptysis  Cardiovascular: as per HPI  Gastrointestinal: Negative for abdominal pain, nausea, vomiting and diarrhea  Musculoskeletal:  No arthritic symptoms  Genitourinary: Negative for dysuria  All other systems were reviewed and are negative except what is stated in the HPI    Physical Exam     Vitals:    11/16/19 1222   BP: 117/64   Pulse: 83   Temp: 97.6 F (36.4 C)   SpO2: 95%       Body mass index is 33.65 kg/m.    General:  Patient appears their stated age, well-nourished.  Alert and in no apparent distress.  Lungs: Respiratory effort unlabored, chest  expansion symmetric.  Cardiac: RRR,  no JVD.   Extremities warm,   Abd: Soft, nondistended, nontender.    RAJ:HHID ROM in all 4 extremities, symmetric pulsation left arm AV fistula  Skin: Color appropriate for race, Skin warm, dry, no gangrene, no non healing ulcers, no varicose veins , no hyperpigmentation, no lipo-dermatosclerosis  Neuro: Good insight and judgment, oriented to person, place, and time CN II-XII intact, gross motor and sensory intact       Labs     CBC:   WBC   Date/Time Value Ref Range Status   11/11/2013 05:13 PM 6.19 3.50 - 10.80 Final     RBC   Date/Time Value Ref Range Status   11/11/2013 05:13 PM 3.86 (L) 4.20 - 5.40 Final     Hgb   Date/Time Value Ref Range Status   11/11/2013 05:13 PM 11.5 (L) 12.0 - 16.0 g/dL Final     Hematocrit   Date/Time Value Ref Range Status   11/11/2013 05:13 PM 34.8 (L) 37.0 - 47.0 % Final     MCV   Date/Time Value Ref Range Status   11/11/2013 05:13 PM 90.2 80.0 - 100.0 fL Final     MCHC   Date/Time Value Ref Range Status   11/11/2013 05:13 PM 33.0 32.0 - 36.0 g/dL Final     RDW   Date/Time Value Ref Range Status   11/11/2013 05:13 PM 17 (H) 12 - 15 % Final     Platelets   Date/Time Value Ref Range Status   11/11/2013 05:13 PM 183 140 - 400 Final       CMP:   Sodium   Date/Time Value Ref Range Status   02/20/2014 04:37 PM 141 136 - 145 mEq/L Final     Potassium   Date/Time Value Ref Range Status   02/20/2014 04:37 PM 4.5 3.5 - 5.1 mEq/L Final     Chloride   Date/Time Value Ref Range Status   02/20/2014 04:37 PM 100 98 - 107 mEq/L Final  CO2   Date/Time Value Ref Range Status   02/20/2014 04:37 PM 23 22 - 29 mEq/L Final     Glucose   Date/Time Value Ref Range Status   02/20/2014 04:37 PM 194 (H) 70 - 100 mg/dL Final     Comment:     Specimen moderately lipemic. ER  Interpretive Data for Adult Female and Female Population  Indeterminate Range:  100-125 mg/dL  Equal to or greater than 126 mg/dL meets the ADA  guidelines for Diabetes Mellitus diagnosis if  symptoms  are present and confirmed by repeat testing.  Random (Non-Fasting)Interpretive Data (Adults):  Equal to or greater than 200 mg/dL meets the ADA  guidelines for Diabetes Mellitus diagnosis if symptoms  are present and confirmed by Fasting Glucose or GTT.     BUN   Date/Time Value Ref Range Status   02/20/2014 04:37 PM 75 (H) 7 - 19 mg/dL Final     Protein, Total   Date/Time Value Ref Range Status   08/15/2013 09:25 AM 6.1 6.0 - 8.3 g/dL Final     Alkaline Phosphatase   Date/Time Value Ref Range Status   08/15/2013 09:25 AM 72 40 - 150 U/L Final     AST (SGOT)   Date/Time Value Ref Range Status   08/15/2013 09:25 AM 15 5 - 34 U/L Final     ALT   Date/Time Value Ref Range Status   08/15/2013 09:25 AM 21 0 - 55 U/L Final     Anion Gap   Date/Time Value Ref Range Status   02/20/2014 04:37 PM 18.0 (H) 5.0 - 15.0 Final       Lipid Panel No results found for: CHOL, TRIG, HDL    Coags:   PT   Date/Time Value Ref Range Status   02/20/2014 04:37 PM 25.5 (H) 12.6 - 15.0 sec Final     PT INR   Date/Time Value Ref Range Status   02/20/2014 04:37 PM 2.4 (H) 0.9 - 1.1 Final     Comment:     Recommended Ranges for Protime INR:    2.0-3.0 for most medical and surgical thromboembolic states    4.6-6.5 for artificial heart valves  INR result may not represent exact Warfarin dosing level during  the transition period from Heparin to Warfarin therapy.  Recommend close clinical monitoring.     PTT   Date/Time Value Ref Range Status   11/11/2013 05:13 PM 36 23 - 37 Final       Assessment and Plan       1. Chronic renal failure, stage 5  [place] [facility] Access Duplex Dopp       ZILDA NO is a 73 y.o. female who presents for routine follow-up of left arm AV fistula which was revised earlier this year.  Patient has no complaints and has undergone follow-up duplex ultrasound examination.  The duplex ultrasound revealed evidence of high-grade stenosis near the arterial anastomosis as well as cephalic arch stent therefore I have  referred the patient to the access center for percutaneous angioplasty and if this does not eliminate the stenosis then possibly a revision with patch should be considered.  This was discussed with the patient and daughter and they have agreed.  I will be seeing her back in the office in 1 month.    Dr. Mina Marble I would like to thank you for allowing me to participate in the care of this patient.    Best regards    Shary Key, MD, FACS,  [facility]  Chief, Section of Vascular [facility]

## 2019-11-29 ENCOUNTER — Encounter (INDEPENDENT_AMBULATORY_CARE_PROVIDER_SITE_OTHER): Payer: Self-pay | Admitting: Student in an Organized Health Care Education/Training Program

## 2019-12-09 ENCOUNTER — Other Ambulatory Visit (INDEPENDENT_AMBULATORY_CARE_PROVIDER_SITE_OTHER): Payer: Self-pay | Admitting: Student in an Organized Health Care Education/Training Program

## 2019-12-09 DIAGNOSIS — E1142 Type 2 diabetes mellitus with diabetic polyneuropathy: Secondary | ICD-10-CM

## 2019-12-19 ENCOUNTER — Ambulatory Visit (INDEPENDENT_AMBULATORY_CARE_PROVIDER_SITE_OTHER): Payer: Self-pay | Admitting: Student in an Organized Health Care Education/Training Program

## 2019-12-19 NOTE — Telephone Encounter (Signed)
Patient called on 12/19/2019 at 2:10pm stating she has a 2 day history of feeling very nervous and shaky.  She states her BP is 140/100.  She states her BP at dialysis was elevated and she is checking regularly.  She states the more higher her BP gets the more nervous she feels.  She states "her whole body is shaking."  Per Dr Vo I advised patient to go to ER asap.  She verbalized understanding

## 2019-12-19 NOTE — Telephone Encounter (Signed)
Please call and check on pt tomorrow

## 2019-12-20 ENCOUNTER — Encounter (INDEPENDENT_AMBULATORY_CARE_PROVIDER_SITE_OTHER): Payer: Self-pay | Admitting: Student in an Organized Health Care Education/Training Program

## 2019-12-20 NOTE — Telephone Encounter (Signed)
S/w pt and pt states shes doing better. Pt went to [facility] and blood pressure went down. Pt does not have nervous feeling and is not shaky. Pt has follow up [facility] for ER f/u scheduled on 12/28/2019. Will call back if having increased bp again.

## 2019-12-26 ENCOUNTER — Encounter (INDEPENDENT_AMBULATORY_CARE_PROVIDER_SITE_OTHER): Payer: Self-pay | Admitting: Family Medicine

## 2019-12-28 ENCOUNTER — Telehealth (INDEPENDENT_AMBULATORY_CARE_PROVIDER_SITE_OTHER): Payer: Medicare Other | Admitting: Student in an Organized Health Care Education/Training Program

## 2020-01-02 ENCOUNTER — Telehealth (INDEPENDENT_AMBULATORY_CARE_PROVIDER_SITE_OTHER): Payer: Medicare Other | Admitting: Student in an Organized Health Care Education/Training Program

## 2020-01-04 ENCOUNTER — Telehealth (INDEPENDENT_AMBULATORY_CARE_PROVIDER_SITE_OTHER): Payer: Self-pay | Admitting: Student in an Organized Health Care Education/Training Program

## 2020-01-04 ENCOUNTER — Encounter (INDEPENDENT_AMBULATORY_CARE_PROVIDER_SITE_OTHER): Payer: Self-pay | Admitting: Student in an Organized Health Care Education/Training Program

## 2020-01-04 ENCOUNTER — Telehealth (INDEPENDENT_AMBULATORY_CARE_PROVIDER_SITE_OTHER): Payer: Medicare Other | Admitting: Student in an Organized Health Care Education/Training Program

## 2020-01-04 VITALS — Wt 180.0 lb

## 2020-01-04 DIAGNOSIS — R7989 Other specified abnormal findings of blood chemistry: Secondary | ICD-10-CM

## 2020-01-04 DIAGNOSIS — N185 Chronic kidney disease, stage 5: Secondary | ICD-10-CM

## 2020-01-04 DIAGNOSIS — I129 Hypertensive chronic kidney disease with stage 1 through stage 4 chronic kidney disease, or unspecified chronic kidney disease: Secondary | ICD-10-CM

## 2020-01-04 DIAGNOSIS — E1142 Type 2 diabetes mellitus with diabetic polyneuropathy: Secondary | ICD-10-CM

## 2020-01-04 DIAGNOSIS — R531 Weakness: Secondary | ICD-10-CM

## 2020-01-04 DIAGNOSIS — K920 Hematemesis: Secondary | ICD-10-CM

## 2020-01-04 DIAGNOSIS — Z992 Dependence on renal dialysis: Secondary | ICD-10-CM

## 2020-01-04 NOTE — Progress Notes (Signed)
Mary [name]                       Date of Virtual Visit: 01/04/2020 10:29 PM        Patient ID: Mary [name] is a 74 y.o. female.  Attending Physician: Ferol Luz, MD       Telemedicine Eligibility:    State Location:  [x]  [place]  []  [place]  []  [place] []  [place]  []  Other:    Physical Location:  [x]  Home  []         []        []          []  Other:    Patient Identity Verification:  [x]  State Issued ID  []  Insurance Eligibility Check  []  Other:    Physical Address Verification: (for 911)  [x]  Yes  []  No    Personal identity shared with patient:  [x]  Yes  []  No    Education on nature of video visit shared with patient:  [x]  Yes  []  No    Emergency plan agreed upon with patient:  [x]  Yes  []  No    If the patient had not had this virtual visit, what would they have done?  []         []         []        []          []  Other:    Visit terminated since not appropriate for virtual care:  [x]  N/A  []  Reason:         Chief Complaint:    Chief Complaint   Patient presents with    ER Follow-up               HPI:    74 y/o female presents today for virtual visit for ER follow up  Verbal consent given to bill insurance  Patient unable to connect to virtual platform, encounter completed by phone    Pt following up from [facility] on 12/19/2019  Was feeling weak, had chest tightness, felt "very shaky for 2 days"  Bps 140s/100s at home  BP improved when she got to the ER  CXR and EKG unremarkable  Cardiac enzymes normal  Oxygen was normal  Felt better after IV fluids    Has been checking bp at home.  Denies chest pain , dizziness or headaches  Drinks some water.  Limits salt intake.    LFTs elevated in the ER  Denies current abdominal pain but reports episode of vomiting black material 2 weeks prior to her ER visit  Had colonoscopy in [place]  Has been taking pepcid for heartburn    End-stage kidney disease- Goes to dialysis 3 times a week  Sees Dr Mina Marble    Blood  sugars have been under good control per pt  Neuropathy has been stable            Problem List:    Patient Active Problem List   Diagnosis    Cellulitis of left leg    ESRD (end stage renal disease)    DM (diabetes mellitus)    Hypertension    Cellulitis    Chest pain at rest    Viral pericarditis    Dyspnea    Chest pain    Pleural effusion    Chronic renal failure, stage 5    Acute calculous cholecystitis    Acute  pain of left knee    Bilateral low back pain with sciatica    Dependence on renal dialysis    Diabetic neuropathy    Dvt femoral (deep venous thrombosis)    Edema    Elevated LFTs    Hyperkalemia    Hyperlipidemia    Hypertensive renal disease    Neuropathy    Primary localized osteoarthritis    Spondylosis of lumbosacral region without myelopathy or radiculopathy    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 (Patient taking differently: Take 1,000 mg by mouth every 12 (twelve) hours as needed   ) 30 tablet 0    aspirin 81 MG tablet Take 81 mg by mouth daily.        atorvastatin (LIPITOR) 10 MG tablet Take 1 tablet (10 mg total) by mouth every morning 90 tablet 0    BD Pen Needle Nano 2nd Gen 32G X 4 MM Misc USE WITH INSULIN PEN TID      calcium acetate (PHOSLO) 667 MG capsule TAKE 1 CAPSULE(667 MG) BY MOUTH THREE TIMES DAILY WITH MEALS 90 capsule 0    ciprofloxacin (CIPRO) 500 MG tablet Take 500 mg by mouth 2 (two) times daily      Colchicine 0.6 MG Cap TK 1 C PO BID PRN FOR GOUT      famotidine (PEPCID) 20 MG tablet Take 20 mg by mouth daily  as needed      gabapentin (NEURONTIN) 300 MG capsule TAKE 2 CAPSULES(600 MG) BY MOUTH THREE TIMES DAILY 540 capsule 0    indomethacin (INDOCIN SR) 75 MG CR capsule Take 1 capsule by mouth daily      insulin detemir (LEVEMIR) 100 UNIT/ML injection Inject 10 Units into the skin nightly 3 pen 1    insulin lispro (HumaLOG) 100 UNIT/ML injection Inject 5 Units into the skin 3 (three) times daily before meals      midodrine (PROAMATINE) 5 MG tablet Take 10 mg by mouth Once every Monday, Wednesday and Friday morning Before dialysis      Multiple Vitamin (MULTIVITAMIN PO) Take 1 tablet by mouth daily         nystatin (MYCOSTATIN) ointment APPLY TWICE DAILY X 10 DAYS 30 g 0    oxyCODONE-acetaminophen (Percocet) 5-325 MG per tablet Take 1 tablet by mouth every 4 (four) hours as needed for Pain 20 tablet 0    SITagliptin (JANUVIA) 25 MG tablet Take 25 mg by mouth every morning      triamcinolone (KENALOG) 0.1 % cream       triamcinolone (KENALOG) 0.1 % cream Apply to affected area twice daily 80 g 1    vitamin D (CHOLECALCIFEROL) 25 MCG (1000 UT) tablet Take 1,000 Units by mouth daily            Allergies:    Allergies   Allergen Reactions    Penicillins Hives     Onset date: 11-24-2011    Shrimp [Shellfish Allergy] Swelling  Also allergic to crab             Past Surgical History:    Past Surgical History:   Procedure Laterality Date    AV FISTULA PLACEMENT  2012    CHOLECYSTECTOMY      COLONOSCOPY  05/22/2018    Results: normal. Reported by Patient.    HYSTERECTOMY      INSERTION [facility] Left 07/07/2019    REVISION, A-V FISTULA UPPER EXTREMITY Left 07/21/2019    Procedure: REVISION, A-V FISTULA UPPER EXTREMITY, WITH PATCH ANGIOPLASTY;  Surgeon: [name], MD;  Location: [place] [facility] OR;  Service: Vascular;  Laterality: Left;    THROMBECTOMY Left 07/21/2019    Procedure: THROMBECTOMY;  Surgeon: [name], MD;  Location[name];  Service: Vascular;  Laterality: Left;            Family History:    Family History   Problem Relation Age of Onset    Diabetes Mother     Cancer Father         throat           Social History:    Social History     Socioeconomic History    Marital status: Widowed     Spouse name: Not on file    Number of children: Not on file    Years of education: Not on file    Highest education level: Not on file   Occupational History    Not on file   Social Needs    Financial resource strain: Not on file    Food insecurity     Worry: Not on file     Inability: Not on file    Transportation needs     Medical: Not on file     Non-medical: Not on file   Tobacco Use    Smoking status: Never Smoker    Smokeless tobacco: Never Used   Substance and Sexual Activity    Alcohol use: No    Drug use: Never    Sexual activity: Not on file   Lifestyle    Physical activity     Days per week: Not on file     Minutes per session: Not on file    Stress: Not on file   Relationships    Social connections     Talks on phone: Not on file     Gets together: Not on file     Attends religious service: Not on file     Active member of club or organization: Not on file     Attends meetings of clubs or organizations: Not on file     Relationship status: Not on file    Intimate partner violence     Fear of current or ex partner: Not on file     Emotionally abused: Not on file     Physically abused: Not on file     Forced sexual activity: Not on file   Other Topics Concern    Not on file   Social History Narrative    Not on file                  Vital Signs:    Wt 81.6 kg (180 lb)    BMI 32.92 kg/m          ROS:    Review of Systems   Constitutional: Positive for appetite change (improving), fatigue and unexpected weight change (has lost 4  lbs since last visit). Negative for chills and fever.   HENT: Positive for hearing loss. Negative for trouble swallowing and voice change.    Eyes: Negative for visual disturbance.   Respiratory: Negative for cough, shortness of breath and  wheezing.    Cardiovascular: Negative for chest pain, palpitations and leg swelling.   Gastrointestinal: Positive for vomiting (a few days prior to ER visit). Negative for abdominal pain, constipation, diarrhea and nausea.   Genitourinary: Negative for dysuria, frequency and pelvic pain.   Musculoskeletal: Positive for arthralgias.        Leg and foot pain   Neurological: Positive for weakness (now resolved) and numbness (neuropathy stable). Negative for dizziness and headaches.   Psychiatric/Behavioral: Negative for dysphoric mood and sleep disturbance.              Physical Exam:    Physical Exam  Vitals signs reviewed.   Constitutional:       General: She is not in acute distress.  Pulmonary:      Comments: No audible wheezing or stridor  No cough  Speaking in full sentences without difficulty  Neurological:      Mental Status: She is alert and oriented to person, place, and time.      Comments: Speech normal  Some difficulty with comprehension  Memory impaired   Psychiatric:         Mood and Affect: Mood normal.      Comments: Requires frequent redirection             Assessment:    1. Generalized weakness    2. Dark emesis  - Ambulatory referral to Gastroenterology    3. Elevated LFTs  - Hepatic function panel (LFT); Future  - US Abdomen Limited Ruq; Future    4. Dependence on renal dialysis    5. Diabetic polyneuropathy associated with type 2 diabetes mellitus    6. Hypertensive renal disease    7. Chronic renal failure, stage 5            Plan:    Recommend GI evaluation  Will check liver sonogram  Repeat LFTs  Advised to go to ER if vomiting black material again  Reviewed worrisome signs/symptoms, advised to go to ER if present  Patient with significant difficulty comprehending plan of care, will touch base with her daughter  20 min spent on phone with patient        Follow-up:    No follow-ups on file.         Ferol Luz, MD

## 2020-01-04 NOTE — Telephone Encounter (Signed)
Pt would like covid vaccine. Please assist with scheduling

## 2020-01-10 ENCOUNTER — Other Ambulatory Visit (INDEPENDENT_AMBULATORY_CARE_PROVIDER_SITE_OTHER): Payer: Self-pay | Admitting: Student in an Organized Health Care Education/Training Program

## 2020-01-10 DIAGNOSIS — E1121 Type 2 diabetes mellitus with diabetic nephropathy: Secondary | ICD-10-CM

## 2020-01-12 ENCOUNTER — Telehealth (INDEPENDENT_AMBULATORY_CARE_PROVIDER_SITE_OTHER): Payer: Self-pay | Admitting: Student in an Organized Health Care Education/Training Program

## 2020-01-12 NOTE — Telephone Encounter (Signed)
Please advise 

## 2020-01-12 NOTE — Telephone Encounter (Signed)
Name of Requester: Patient  Clinical Question: patient wanted to know is she able to get the COVID-19 Vaccine @ [facility]  She has appt w/them tomorrow and they want to give her the vaccine please advise

## 2020-01-12 NOTE — Telephone Encounter (Signed)
This is fine if she is able to get this done. Let me know if anything else is needed

## 2020-01-12 NOTE — Telephone Encounter (Signed)
S/w pt and informed of message per Dr.White

## 2020-01-17 ENCOUNTER — Encounter (INDEPENDENT_AMBULATORY_CARE_PROVIDER_SITE_OTHER): Payer: Self-pay

## 2020-01-17 ENCOUNTER — Ambulatory Visit (INDEPENDENT_AMBULATORY_CARE_PROVIDER_SITE_OTHER): Payer: Medicare Other

## 2020-01-17 DIAGNOSIS — N185 Chronic kidney disease, stage 5: Secondary | ICD-10-CM

## 2020-01-17 NOTE — Progress Notes (Signed)
VASCULAR SURGERY PATIENT FORM       Appt Date/Time: 01/18/2020 @2 :20 pm  PCP: Ferol Luz, MD  Referring Physician:    New Patient Follow-Up   X Post-op   [place] [facility] [place] [facility]  01/16/19 hemo Other Imaging     Chief Complaint/HPI: F/u, post Left AV Fistula revision 07/21/2019.    Date of Last Visit: 11/16/2019  F/u 1 month. I have referred the patient to the access center for percutaneous angioplasty and if this does not eliminate the stenosis then possibly a revision with patch should be considered.   Allergies   Allergen Reactions    Penicillins Hives     Onset date: 11-24-2011    Shrimp [Shellfish Allergy] Swelling     Also allergic to crab        Selected Medications:    Coumadin Plavix Eliquis Xarelto Aspirin Fish Oil Lovenox   Insulin Metformin Other Diabetes Atorvastatin  Rosuvastatin  Simvastatin Pravastatin  Lovastatin Other Cholesterol Pradaxa   MHx:  Stroke/TIA/Seizures Thyroid  Diabetes   Myocardial Infarction Arrhythmia  COPD/Asthma (other lung)   Kidney Disease Liver Disease DVT   Wounds Musculoskeletal (spine) Cancer             Smoker           Former Smoker   X       Never Smoker    Vital Signs this Visit Pulses  HT WT HR   Rad Ulnar Brach Fem Pop DP PT       R          BP L BP R SpO2  L                        Imaging results:           Plan of Care:

## 2020-01-18 ENCOUNTER — Encounter (INDEPENDENT_AMBULATORY_CARE_PROVIDER_SITE_OTHER): Payer: Self-pay | Admitting: Specialist

## 2020-01-18 ENCOUNTER — Ambulatory Visit (INDEPENDENT_AMBULATORY_CARE_PROVIDER_SITE_OTHER): Payer: Medicare Other | Admitting: Specialist

## 2020-01-18 ENCOUNTER — Encounter (INDEPENDENT_AMBULATORY_CARE_PROVIDER_SITE_OTHER): Payer: Self-pay

## 2020-01-18 ENCOUNTER — Telehealth (INDEPENDENT_AMBULATORY_CARE_PROVIDER_SITE_OTHER): Payer: Medicare Other | Admitting: Student in an Organized Health Care Education/Training Program

## 2020-01-18 VITALS — BP 146/76 | HR 80 | Wt 183.2 lb

## 2020-01-18 DIAGNOSIS — N185 Chronic kidney disease, stage 5: Secondary | ICD-10-CM

## 2020-01-18 NOTE — Progress Notes (Signed)
[facility]    Chief Complaint   Patient presents with    Follow-up      F/u, post Left AV Fistula revision 07/21/2019.         History of Present Illness     Mary [name] is a 74 y.o. female who presents for routine follow-up of left arm AV fistula which was revised in 2020.  Patient has no complaints and has undergone follow-up duplex ultrasound examination.    Past Medical History     Past Medical History:   Diagnosis Date    Abnormal vision     Arthritis     Cataracts, bilateral     Deep venous thrombosis of distal lower extremity ~2013    left leg    End-stage renal disease 2013    Hemodialysis MWF    GERD (gastroesophageal reflux disease)     Hyperlipidemia     Type 2 diabetes mellitus, controlled     Does not check FBS       Allergies     Allergies   Allergen Reactions    Penicillins Hives     Onset date: 11-24-2011    Shrimp [Shellfish Allergy] Swelling     Also allergic to crab       Medications     Current Outpatient Medications on File Prior to Visit   Medication Sig Dispense Refill    Accu-Chek FastClix Lancets Misc USE TO CHECK BLOOD SUGARS THREE TIMES DAILY 300 each 3    Accu-Chek Guide test strip U TID      acetaminophen (TYLENOL) 500 MG tablet Take 1 tablet (500 mg total) by mouth every 12 (twelve) hours as needed. (Patient taking differently: Take 1,000 mg by mouth every 12 (twelve) hours as needed   ) 30 tablet 0    aspirin 81 MG tablet Take 81 mg by mouth daily.        atorvastatin (LIPITOR) 10 MG tablet TAKE 1 TABLET(10 MG) BY MOUTH EVERY MORNING 90 tablet 0    BD Pen Needle Nano 2nd Gen 32G X 4 MM Misc USE WITH INSULIN PEN TID      calcium acetate (PHOSLO) 667 MG capsule TAKE 1 CAPSULE(667 MG) BY MOUTH THREE TIMES DAILY WITH MEALS 90 capsule 0    Colchicine 0.6 MG Cap TK 1 C PO BID PRN FOR GOUT      famotidine (PEPCID) 20 MG tablet Take 20 mg by mouth daily as needed      Febuxostat (ULORIC) 40 MG tablet Take 40 mg by mouth every morning         gabapentin  (NEURONTIN) 300 MG capsule TAKE 2 CAPSULES(600 MG) BY MOUTH THREE TIMES DAILY 540 capsule 0    indomethacin (INDOCIN SR) 75 MG CR capsule Take 1 capsule by mouth daily      insulin detemir (LEVEMIR) 100 UNIT/ML injection Inject 10 Units into the skin nightly 3 pen 1    insulin lispro (HumaLOG) 100 UNIT/ML injection Inject 5 Units into the skin 3 (three) times daily before meals      midodrine (PROAMATINE) 5 MG tablet Take 10 mg by mouth Once every Monday, Wednesday and Friday morning Before dialysis      Multiple Vitamin (MULTIVITAMIN PO) Take 1 tablet by mouth daily         nystatin (MYCOSTATIN) ointment APPLY TWICE DAILY X 10 DAYS 30 g 0    oxyCODONE-acetaminophen (Percocet) 5-325 MG per tablet Take 1 tablet by mouth every 4 (four)  hours as needed for Pain 20 tablet 0    SITagliptin (JANUVIA) 25 MG tablet Take 25 mg by mouth every morning      triamcinolone (KENALOG) 0.1 % cream       triamcinolone (KENALOG) 0.1 % cream Apply to affected area twice daily 80 g 1    vitamin D (CHOLECALCIFEROL) 25 MCG (1000 UT) tablet Take 1,000 Units by mouth daily      [DISCONTINUED] ciprofloxacin (CIPRO) 500 MG tablet Take 500 mg by mouth 2 (two) times daily       No current facility-administered medications on file prior to visit.        Review of Systems     Constitutional: Negative for fevers and chills  Skin: No rash or lesions  Respiratory: Negative for cough, wheezing, or hemoptysis  Cardiovascular: as per HPI  Gastrointestinal: Negative for abdominal pain, nausea, vomiting and diarrhea  Musculoskeletal:  No arthritic symptoms  Genitourinary: Negative for dysuria  All other systems were reviewed and are negative except what is stated in the HPI    Physical Exam     Vitals:    01/18/20 1431   BP: 146/76   Pulse: 80   SpO2: 97%       Body mass index is 33.51 kg/m.    General:  Patient appears their stated age, well-nourished.  Alert and in no apparent distress.  Lungs: Respiratory effort unlabored, chest expansion  symmetric.  Cardiac: RRR,  no JVD.   Extremities warm,   Abd: Soft, nondistended, nontender.    TIR:WERX ROM in all 4 extremities, symmetric pulsation left arm AV fistula  Skin: Color appropriate for race, Skin warm, dry, no gangrene, no non healing ulcers, no varicose veins , no hyperpigmentation, no lipo-dermatosclerosis  Neuro: Good insight and judgment, oriented to person, place, and time CN II-XII intact, gross motor and sensory intact       Labs     CBC:   WBC   Date/Time Value Ref Range Status   11/11/2013 05:13 PM 6.19 3.50 - 10.80 Final     RBC   Date/Time Value Ref Range Status   11/11/2013 05:13 PM 3.86 (L) 4.20 - 5.40 Final     Hgb   Date/Time Value Ref Range Status   11/11/2013 05:13 PM 11.5 (L) 12.0 - 16.0 g/dL Final     Hematocrit   Date/Time Value Ref Range Status   11/11/2013 05:13 PM 34.8 (L) 37.0 - 47.0 % Final     MCV   Date/Time Value Ref Range Status   11/11/2013 05:13 PM 90.2 80.0 - 100.0 fL Final     MCHC   Date/Time Value Ref Range Status   11/11/2013 05:13 PM 33.0 32.0 - 36.0 g/dL Final     RDW   Date/Time Value Ref Range Status   11/11/2013 05:13 PM 17 (H) 12 - 15 % Final     Platelets   Date/Time Value Ref Range Status   11/11/2013 05:13 PM 183 140 - 400 Final       CMP:   Sodium   Date/Time Value Ref Range Status   02/20/2014 04:37 PM 141 136 - 145 mEq/L Final     Potassium   Date/Time Value Ref Range Status   02/20/2014 04:37 PM 4.5 3.5 - 5.1 mEq/L Final     Chloride   Date/Time Value Ref Range Status   02/20/2014 04:37 PM 100 98 - 107 mEq/L Final     CO2   Date/Time Value Ref  Range Status   02/20/2014 04:37 PM 23 22 - 29 mEq/L Final     Glucose   Date/Time Value Ref Range Status   02/20/2014 04:37 PM 194 (H) 70 - 100 mg/dL Final     Comment:     Specimen moderately lipemic. ER  Interpretive Data for Adult Female and Female Population  Indeterminate Range:  100-125 mg/dL  Equal to or greater than 126 mg/dL meets the ADA  guidelines for Diabetes Mellitus diagnosis if symptoms  are present  and confirmed by repeat testing.  Random (Non-Fasting)Interpretive Data (Adults):  Equal to or greater than 200 mg/dL meets the ADA  guidelines for Diabetes Mellitus diagnosis if symptoms  are present and confirmed by Fasting Glucose or GTT.     BUN   Date/Time Value Ref Range Status   02/20/2014 04:37 PM 75 (H) 7 - 19 mg/dL Final     Protein, Total   Date/Time Value Ref Range Status   08/15/2013 09:25 AM 6.1 6.0 - 8.3 g/dL Final     Alkaline Phosphatase   Date/Time Value Ref Range Status   08/15/2013 09:25 AM 72 40 - 150 U/L Final     AST (SGOT)   Date/Time Value Ref Range Status   08/15/2013 09:25 AM 15 5 - 34 U/L Final     ALT   Date/Time Value Ref Range Status   08/15/2013 09:25 AM 21 0 - 55 U/L Final     Anion Gap   Date/Time Value Ref Range Status   02/20/2014 04:37 PM 18.0 (H) 5.0 - 15.0 Final       Lipid Panel No results found for: CHOL, TRIG, HDL    Coags:   PT   Date/Time Value Ref Range Status   02/20/2014 04:37 PM 25.5 (H) 12.6 - 15.0 sec Final     PT INR   Date/Time Value Ref Range Status   02/20/2014 04:37 PM 2.4 (H) 0.9 - 1.1 Final     Comment:     Recommended Ranges for Protime INR:    2.0-3.0 for most medical and surgical thromboembolic states    4.0-9.7 for artificial heart valves  INR result may not represent exact Warfarin dosing level during  the transition period from Heparin to Warfarin therapy.  Recommend close clinical monitoring.     PTT   Date/Time Value Ref Range Status   11/11/2013 05:13 PM 36 23 - 37 Final       Assessment and Plan       1. Chronic renal failure, stage 5  [place] Hemodialysis Access Duplex Dopp    [place] Hemodialysis Access Duplex Dopp       Mary [name] is a 74 y.o. female who presents for routine follow-up of left arm AV fistula which was revised earlier this year.  Patient has no complaints and has undergone follow-up duplex ultrasound examination.  The duplex ultrasound revealed evidence of high-grade stenosis near the arterial anastomosis as well as cephalic arch stent  therefore I have referred the patient to the access center for percutaneous angioplasty and if this does not eliminate the stenosis then possibly a revision with patch should be considered.  This was discussed with the patient and daughter and they have agreed.  I will be seeing her back in the office in 3 months.    Dr. Mina Marble I would like to thank you for allowing me to participate in the care of this patient.    Best regards    [employer],  MD, [facility], [facility]  Chief, Section of Vascular [facility]

## 2020-01-27 NOTE — Telephone Encounter (Signed)
Patient stated that she was going to have her vaccine done somewhere else. Patient stated it was ok to take her off the list.

## 2020-02-13 ENCOUNTER — Encounter (INDEPENDENT_AMBULATORY_CARE_PROVIDER_SITE_OTHER): Payer: Self-pay | Admitting: Student in an Organized Health Care Education/Training Program

## 2020-02-20 ENCOUNTER — Other Ambulatory Visit (INDEPENDENT_AMBULATORY_CARE_PROVIDER_SITE_OTHER): Payer: Self-pay | Admitting: Student in an Organized Health Care Education/Training Program

## 2020-02-20 DIAGNOSIS — R238 Other skin changes: Secondary | ICD-10-CM

## 2020-03-02 ENCOUNTER — Other Ambulatory Visit (INDEPENDENT_AMBULATORY_CARE_PROVIDER_SITE_OTHER): Payer: Self-pay | Admitting: Family Medicine

## 2020-03-27 ENCOUNTER — Telehealth (INDEPENDENT_AMBULATORY_CARE_PROVIDER_SITE_OTHER): Payer: Self-pay | Admitting: Student in an Organized Health Care Education/Training Program

## 2020-04-03 NOTE — Telephone Encounter (Signed)
error 

## 2020-04-08 ENCOUNTER — Other Ambulatory Visit (INDEPENDENT_AMBULATORY_CARE_PROVIDER_SITE_OTHER): Payer: Self-pay | Admitting: Student in an Organized Health Care Education/Training Program

## 2020-04-08 DIAGNOSIS — E1142 Type 2 diabetes mellitus with diabetic polyneuropathy: Secondary | ICD-10-CM

## 2020-04-09 ENCOUNTER — Other Ambulatory Visit (INDEPENDENT_AMBULATORY_CARE_PROVIDER_SITE_OTHER): Payer: Self-pay | Admitting: Student in an Organized Health Care Education/Training Program

## 2020-04-09 DIAGNOSIS — E1121 Type 2 diabetes mellitus with diabetic nephropathy: Secondary | ICD-10-CM

## 2020-04-09 NOTE — Telephone Encounter (Signed)
Med renewed. Please schedule chronic care visit prior to next refill. Please schedule for 30 min.

## 2020-04-11 ENCOUNTER — Telehealth (INDEPENDENT_AMBULATORY_CARE_PROVIDER_SITE_OTHER): Payer: Self-pay | Admitting: Student in an Organized Health Care Education/Training Program

## 2020-04-11 NOTE — Telephone Encounter (Signed)
error 

## 2020-04-16 ENCOUNTER — Encounter (INDEPENDENT_AMBULATORY_CARE_PROVIDER_SITE_OTHER): Payer: Self-pay

## 2020-04-16 NOTE — Progress Notes (Signed)
Appt Date/Time: 04/18/20  @ 12:00 pm    74 y.o. female   PCP: Ferol Luz, MD  Referring Physician:    New Patient Follow-Up   X Post-op   [place] Today  11:30 hemo [place] - [facility] Other Imaging     Chief Complaint/HPI:  3 mont F/u Left AV Fistula.  Date of Last Visit: 01/18/20 I have referred the patient to the access center for percutaneous angioplasty.    Allergies:   Allergies   Allergen Reactions    Penicillins Hives     Onset date: 11-24-2011    Shrimp [Shellfish Allergy] Swelling     Also allergic to crab          Selected Medications:    Coumadin Xarelto Eliquis Pradaxa Lovenox Other thinner:    Plavix Aspirin Brillinta Effient  Pletal Trental Fish Oil   Insulin Metformin Other Diabetes Atorvastatin Rosuvastatin Other Chol:    MHx:  Stroke / TIA / Seizures HTN / HLD  Diabetes   MI / CAD A-fib / Arrhythmia  COPD / Asthma / (other lung)   Kidney Disease Liver Disease DVT / Coagulopathy   Wounds Spine / other MSK  Cancer            Smoker           Former Smoker    X      Never Smoker    Vital Signs this Visit Pulses  HT  BP R BP L Temp   Rad Ulnar Brach Fem Pop DP PT        R          WT  Pulse R Pulse L SpO2       L          Imaging results:        Plan of Care:

## 2020-04-17 ENCOUNTER — Encounter (INDEPENDENT_AMBULATORY_CARE_PROVIDER_SITE_OTHER): Payer: Self-pay | Admitting: Student in an Organized Health Care Education/Training Program

## 2020-04-18 ENCOUNTER — Ambulatory Visit (INDEPENDENT_AMBULATORY_CARE_PROVIDER_SITE_OTHER): Payer: Medicare Other | Admitting: Specialist

## 2020-04-18 ENCOUNTER — Encounter (INDEPENDENT_AMBULATORY_CARE_PROVIDER_SITE_OTHER): Payer: Self-pay | Admitting: Specialist

## 2020-04-18 ENCOUNTER — Ambulatory Visit (INDEPENDENT_AMBULATORY_CARE_PROVIDER_SITE_OTHER): Payer: Medicare Other

## 2020-04-18 ENCOUNTER — Encounter (INDEPENDENT_AMBULATORY_CARE_PROVIDER_SITE_OTHER): Payer: Self-pay

## 2020-04-18 VITALS — BP 129/80 | HR 83 | Temp 98.1°F | Wt 181.0 lb

## 2020-04-18 DIAGNOSIS — N185 Chronic kidney disease, stage 5: Secondary | ICD-10-CM

## 2020-04-18 NOTE — Progress Notes (Signed)
[facility]    Chief Complaint   Patient presents with    Follow-up      3 mont F/u Left AV Fistula.         History of Present Illness     Mary [name] is a 74 y.o. female who presents for routine follow-up of left arm AV fistula which was revised in 2020.  Patient has no complaints except some excess bleeding and elevated venous pressures and has undergone follow-up duplex ultrasound examination.    Past Medical History     Past Medical History:   Diagnosis Date    Abnormal vision     Arthritis     Cataracts, bilateral     Deep venous thrombosis of distal lower extremity ~2013    left leg    End-stage renal disease 2013    Hemodialysis MWF    GERD (gastroesophageal reflux disease)     Hyperlipidemia     Type 2 diabetes mellitus, controlled     Does not check FBS       Allergies     Allergies   Allergen Reactions    Penicillins Hives     Onset date: 11-24-2011    Shrimp [Shellfish Allergy] Swelling     Also allergic to crab       Medications     Current Outpatient Medications on File Prior to Visit   Medication Sig Dispense Refill    Accu-Chek FastClix Lancets Misc USE TO CHECK BLOOD SUGARS THREE TIMES DAILY 300 each 3    Accu-Chek Guide test strip U TID      acetaminophen (TYLENOL) 500 MG tablet Take 1 tablet (500 mg total) by mouth every 12 (twelve) hours as needed. (Patient taking differently: Take 1,000 mg by mouth every 12 (twelve) hours as needed   ) 30 tablet 0    aspirin 81 MG tablet Take 81 mg by mouth daily.        atorvastatin (LIPITOR) 10 MG tablet TAKE 1 TABLET(10 MG) BY MOUTH EVERY MORNING 90 tablet 0    BD Pen Needle Nano 2nd Gen 32G X 4 MM Misc USE WITH INSULIN PEN TID      calcium acetate (PHOSLO) 667 MG capsule TAKE 1 CAPSULE(667 MG) BY MOUTH THREE TIMES DAILY WITH MEALS 90 capsule 0    Colchicine 0.6 MG Cap TK 1 C PO BID PRN FOR GOUT      famotidine (PEPCID) 20 MG tablet Take 20 mg by mouth daily as needed      Febuxostat (ULORIC) 40 MG tablet Take 40 mg by  mouth every morning         gabapentin (NEURONTIN) 300 MG capsule TAKE 2 CAPSULES(600 MG) BY MOUTH THREE TIMES DAILY 540 capsule 0    indomethacin (INDOCIN SR) 75 MG CR capsule Take 1 capsule by mouth daily      insulin detemir (LEVEMIR) 100 UNIT/ML injection Inject 10 Units into the skin nightly 3 pen 1    insulin lispro (HumaLOG) 100 UNIT/ML injection Inject 5 Units into the skin 3 (three) times daily before meals      midodrine (PROAMATINE) 5 MG tablet Take 10 mg by mouth Once every Monday, Wednesday and Friday morning Before dialysis      Multiple Vitamin (MULTIVITAMIN PO) Take 1 tablet by mouth daily         nystatin (MYCOSTATIN) ointment APPLY TWICE DAILY X 10 DAYS 30 g 0    oxyCODONE-acetaminophen (Percocet) 5-325 MG per tablet Take  1 tablet by mouth every 4 (four) hours as needed for Pain 20 tablet 0    SITagliptin (JANUVIA) 25 MG tablet Take 25 mg by mouth every morning      triamcinolone (KENALOG) 0.1 % cream       triamcinolone (KENALOG) 0.1 % cream APPLY EXTERNALLY TO THE AFFECTED AREA TWICE DAILY 80 g 1    vitamin D (CHOLECALCIFEROL) 25 MCG (1000 UT) tablet Take 1,000 Units by mouth daily       No current facility-administered medications on file prior to visit.       Review of Systems     Constitutional: Negative for fevers and chills  Skin: No rash or lesions  Respiratory: Negative for cough, wheezing, or hemoptysis  Cardiovascular: as per HPI  Gastrointestinal: Negative for abdominal pain, nausea, vomiting and diarrhea  Musculoskeletal:  No arthritic symptoms  Genitourinary: Negative for dysuria  All other systems were reviewed and are negative except what is stated in the HPI    Physical Exam     Vitals:    04/18/20 1223   BP: 129/80   Pulse: 83   Temp: 98.1 F (36.7 C)   SpO2: 98%       Body mass index is 33.1 kg/m.    General:  Patient appears their stated age, well-nourished.  Alert and in no apparent distress.  Lungs: Respiratory effort unlabored, chest expansion  symmetric.  Cardiac: RRR,  no JVD.   Extremities warm,   Abd: Soft, nondistended, nontender.    MCN:OBSJ ROM in all 4 extremities, symmetric pulsation left arm AV fistula  Skin: Color appropriate for race, Skin warm, dry, no gangrene, no non healing ulcers, no varicose veins , no hyperpigmentation, no lipo-dermatosclerosis  Neuro: Good insight and judgment, oriented to person, place, and time CN II-XII intact, gross motor and sensory intact       Labs     CBC:   WBC   Date/Time Value Ref Range Status   11/11/2013 05:13 PM 6.19 3.50 - 10.80 Final     RBC   Date/Time Value Ref Range Status   11/11/2013 05:13 PM 3.86 (L) 4.20 - 5.40 Final     Hgb   Date/Time Value Ref Range Status   11/11/2013 05:13 PM 11.5 (L) 12.0 - 16.0 g/dL Final     Hematocrit   Date/Time Value Ref Range Status   11/11/2013 05:13 PM 34.8 (L) 37.0 - 47.0 % Final     MCV   Date/Time Value Ref Range Status   11/11/2013 05:13 PM 90.2 80.0 - 100.0 fL Final     MCHC   Date/Time Value Ref Range Status   11/11/2013 05:13 PM 33.0 32.0 - 36.0 g/dL Final     RDW   Date/Time Value Ref Range Status   11/11/2013 05:13 PM 17 (H) 12 - 15 % Final     Platelets   Date/Time Value Ref Range Status   11/11/2013 05:13 PM 183 140 - 400 Final       CMP:   Sodium   Date/Time Value Ref Range Status   02/20/2014 04:37 PM 141 136 - 145 mEq/L Final     Potassium   Date/Time Value Ref Range Status   02/20/2014 04:37 PM 4.5 3.5 - 5.1 mEq/L Final     Chloride   Date/Time Value Ref Range Status   02/20/2014 04:37 PM 100 98 - 107 mEq/L Final     CO2   Date/Time Value Ref Range Status   02/20/2014  04:37 PM 23 22 - 29 mEq/L Final     Glucose   Date/Time Value Ref Range Status   02/20/2014 04:37 PM 194 (H) 70 - 100 mg/dL Final     Comment:     Specimen moderately lipemic. ER  Interpretive Data for Adult Female and Female Population  Indeterminate Range:  100-125 mg/dL  Equal to or greater than 126 mg/dL meets the ADA  guidelines for Diabetes Mellitus diagnosis if symptoms  are present  and confirmed by repeat testing.  Random (Non-Fasting)Interpretive Data (Adults):  Equal to or greater than 200 mg/dL meets the ADA  guidelines for Diabetes Mellitus diagnosis if symptoms  are present and confirmed by Fasting Glucose or GTT.     BUN   Date/Time Value Ref Range Status   02/20/2014 04:37 PM 75 (H) 7 - 19 mg/dL Final     Protein, Total   Date/Time Value Ref Range Status   08/15/2013 09:25 AM 6.1 6.0 - 8.3 g/dL Final     Alkaline Phosphatase   Date/Time Value Ref Range Status   08/15/2013 09:25 AM 72 40 - 150 U/L Final     AST (SGOT)   Date/Time Value Ref Range Status   08/15/2013 09:25 AM 15 5 - 34 U/L Final     ALT   Date/Time Value Ref Range Status   08/15/2013 09:25 AM 21 0 - 55 U/L Final     Anion Gap   Date/Time Value Ref Range Status   02/20/2014 04:37 PM 18.0 (H) 5.0 - 15.0 Final       Lipid Panel No results found for: CHOL, TRIG, HDL    Coags:   PT   Date/Time Value Ref Range Status   02/20/2014 04:37 PM 25.5 (H) 12.6 - 15.0 sec Final     PT INR   Date/Time Value Ref Range Status   02/20/2014 04:37 PM 2.4 (H) 0.9 - 1.1 Final     Comment:     Recommended Ranges for Protime INR:    2.0-3.0 for most medical and surgical thromboembolic states    4.4-6.1 for artificial heart valves  INR result may not represent exact Warfarin dosing level during  the transition period from Heparin to Warfarin therapy.  Recommend close clinical monitoring.     PTT   Date/Time Value Ref Range Status   11/11/2013 05:13 PM 36 23 - 37 Final       Assessment and Plan       1. Chronic renal failure, stage 5         Mary [name] is a 74 y.o. female who presents for routine follow-up of left arm AV fistula which was revised earlier this year.  Patient has no complaints except mild excess bleeding and elevated venous pressures and has undergone follow-up duplex ultrasound examination.  The duplex ultrasound revealed evidence of high-grade stenosis near the arterial anastomosis as well as cephalic arch stent.  Patient has  previously undergone percutaneous angioplasty with no significant improvement therefore I will plan for repeat fistulogram with possible open surgical revision this was discussed with the patient and has agreed.  The risks and benefits of the procedures were explained to the patient and agreed. The risk of infection, bleeding, thrombosis, steal syndrome, nerve injury and other complications were explained in detail and agreed.     Shary Key, MD, [place], [facility]  Chief, Section of Vascular [facility]

## 2020-05-07 ENCOUNTER — Telehealth (INDEPENDENT_AMBULATORY_CARE_PROVIDER_SITE_OTHER): Payer: Self-pay

## 2020-05-07 ENCOUNTER — Telehealth (INDEPENDENT_AMBULATORY_CARE_PROVIDER_SITE_OTHER): Payer: Self-pay | Admitting: Student in an Organized Health Care Education/Training Program

## 2020-05-07 ENCOUNTER — Encounter (INDEPENDENT_AMBULATORY_CARE_PROVIDER_SITE_OTHER): Payer: Self-pay | Admitting: Student in an Organized Health Care Education/Training Program

## 2020-05-07 NOTE — Telephone Encounter (Signed)
Patient scheduled for:05/14/2020    Surgeon: Shary Key, MD  Procedure: Left arm fistulogram and revision   Arrive at: 10:30 am  Anesthesia: Choice   Location: [facility]  Special instructions: NPO   Knows to have Labs: Not Needed  Need medication instructions: Yes    Mailing out packet - will mail    Daughter will call back to schedule post op

## 2020-05-07 NOTE — Telephone Encounter (Signed)
Placed form in [place] PCP inbox.

## 2020-05-07 NOTE — Telephone Encounter (Signed)
Dr. Richardson Landry office states they faxed a form 2 x to our [phone], asked to refax to [phone]. Awaiting forms.

## 2020-05-07 NOTE — Telephone Encounter (Signed)
Ok to continue to take Aspirin. No other pre op testing required due to low risk sx.     The following pre op medication instructions to be sent to pt:     Pre-Op Day of Surgery Medication Instructions for Mary [name]:        - Ok to continue to take Aspirin as scheduled and no need to hold preoperatively.         Day of Surgery Medication Instructions:    - A nurse from [facility] will call you to discuss all other medication instructions. If you do not want to wait for their phone call, you can reach them by calling [phone].

## 2020-05-07 NOTE — Telephone Encounter (Signed)
Patient is waiting for shoes to be sent over. Patient has been waiting about a month  Saw Dr. Richardson Landry (foot doctor)     Good call back # for patient: Ph. [phone]

## 2020-05-07 NOTE — Telephone Encounter (Signed)
-----   Message from [name], MD sent at 04/18/2020 12:31 PM EDT -----  Left arm fistulogram and revision avf need 1 1/4 hr    [phone] daughter carol

## 2020-05-08 NOTE — Telephone Encounter (Signed)
Pt scheduled a VV for Friday 05/11/2020. Pt stated she will use her daughter's phone for the VV.

## 2020-05-08 NOTE — Telephone Encounter (Signed)
I do not have enough information to be able to answer the questions on the form. Please schedule a visit with me to discuss

## 2020-05-10 ENCOUNTER — Ambulatory Visit: Payer: Medicare Other | Attending: Specialist

## 2020-05-10 ENCOUNTER — Other Ambulatory Visit (INDEPENDENT_AMBULATORY_CARE_PROVIDER_SITE_OTHER): Payer: Self-pay | Admitting: Student in an Organized Health Care Education/Training Program

## 2020-05-10 ENCOUNTER — Encounter: Payer: Self-pay | Admitting: Specialist

## 2020-05-10 DIAGNOSIS — R238 Other skin changes: Secondary | ICD-10-CM

## 2020-05-10 NOTE — Pre-Procedure Instructions (Signed)
Pt left Vm [phone] requesting a call back to review medication instructions.   Pt called back and reviewed all of pts medications with preop instructions provided. Pt verbalizes understanding and states no further questions at this time.

## 2020-05-10 NOTE — Pre-Procedure Instructions (Signed)
•   Surgical Risk Level : (Low, Intermediate, High)  o low     Surgeon Testing Requirements:  o NA     Anesthesia Guideline Requirements:  o NA     Specialist Notes / Test Results / Records Requested:  o NA     Recent Hospitalization / ED Visit:   o NA     Future Plan / Upcoming Appts:   o NA     Labs/Testing @ [facility]:   o NA     Email Sent To Patient:   o NA     NPO Instructions given to patient:    o Per surgeon, nothing to eat after MN, sips of water with meds     Faxes Sent To:   o NA     Epic Orders Entered:   o NS, PIV, blood glucose, potassium     Other Outlying information gathered that does not fit anywhere else  o NA     Chart Room Handoff for Further  Follow-up if Applicable:  o NA      Visitor Restriction Guidelines:   One family member may accompany the patient on day of surgery-All persons will be screened at hospital main entrance upon arrival, which will include a temperature reading and donning a face mask.   Family members and patients must wear masks and use social distancing while in waiting areas and cafeteria area.   Once patient goes to OR, family members are encouraged to go home for inpatients or stay in car for outpatients. Physicians will call contact person to give report of procedure.      Inpatient visitation will be honored during inpatient visiting hours- 1 consistent family member to visit daily from 1300-1700. No family members will be allowed into Phase 1 recovery areas.   For outpatients, family will be called to provide discharge instructions.      Advise to call surgeon if need to cancel surgery arises.      Patient verbalized understanding and acceptance of above information.

## 2020-05-11 ENCOUNTER — Encounter (INDEPENDENT_AMBULATORY_CARE_PROVIDER_SITE_OTHER): Payer: Self-pay | Admitting: Student in an Organized Health Care Education/Training Program

## 2020-05-11 ENCOUNTER — Telehealth (INDEPENDENT_AMBULATORY_CARE_PROVIDER_SITE_OTHER): Payer: Medicare Other | Admitting: Student in an Organized Health Care Education/Training Program

## 2020-05-11 ENCOUNTER — Telehealth (INDEPENDENT_AMBULATORY_CARE_PROVIDER_SITE_OTHER): Payer: Self-pay

## 2020-05-11 VITALS — Ht 63.0 in | Wt 194.7 lb

## 2020-05-11 DIAGNOSIS — Z992 Dependence on renal dialysis: Secondary | ICD-10-CM

## 2020-05-11 DIAGNOSIS — Z794 Long term (current) use of insulin: Secondary | ICD-10-CM

## 2020-05-11 DIAGNOSIS — I70244 Atherosclerosis of native arteries of left leg with ulceration of heel and midfoot: Secondary | ICD-10-CM

## 2020-05-11 DIAGNOSIS — N185 Chronic kidney disease, stage 5: Secondary | ICD-10-CM

## 2020-05-11 DIAGNOSIS — E1142 Type 2 diabetes mellitus with diabetic polyneuropathy: Secondary | ICD-10-CM

## 2020-05-11 DIAGNOSIS — I70234 Atherosclerosis of native arteries of right leg with ulceration of heel and midfoot: Secondary | ICD-10-CM

## 2020-05-11 NOTE — Telephone Encounter (Signed)
Statement of certifying physician for therapeutic shoes and office note faxed to [facility] [phone].

## 2020-05-11 NOTE — Progress Notes (Signed)
[place]                       Date of Virtual Visit: 05/11/2020 11:20 PM      Verbal consent has been obtained from the patient to conduct this video visit encounter to minimize exposure to COVID-19. Patient was positively identified and was verified to be located in the state of [place] at the time of the visit       Patient ID: Mary [name] is a 74 y.o. female.  Attending Physician: Ferol Luz, MD       Telemedicine Eligibility:    State Location:  [x]  [place]  []  [place]  []  [facility] []  [place]  []  Other:    Physical Location:  [x]  Home  []         []        []          []  Other:    Patient Identity Verification:  [x]  State Issued ID  []  Insurance Eligibility Check  []  Other:    Physical Address Verification: (for 911)  [x]  Yes  []  No    Personal identity shared with patient:  [x]  Yes  []  No    Education on nature of video visit shared with patient:  [x]  Yes  []  No    Emergency plan agreed upon with patient:  [x]  Yes  []  No    If the patient had not had this virtual visit, what would they have done?  []         []         []        []          []  Other:    Visit terminated since not appropriate for virtual care:  [x]  N/A  []  Reason:         Chief Complaint:    Chief Complaint   Patient presents with    Peripheral Neuropathy               HPI:    74 YO Female  Consents to VV and billing     Present for form completion for   Needs authorization to get diabetic shoes  Sees Dr 66 (podiatry)  Has diabetic neuropathy, reports that Dr Barbette Merino has had to pare down multiple calluses  Has peripheral vascular disease, has had ulcers on both heels  Treated by podiatry    Blood sugars have been under good control per pt  No hypoglycemia    Gets dialysis 3 times/week  Has labs done at dialysis  Unsure if Dr Richardson Landry has been monitoring cholesterol or a1c    Will be undergoing revision of her AV fistula with Dr Mina Marble        *Permission obtained to  bill insurance for today's VV*          Problem List:    Patient Active Problem List   Diagnosis    Cellulitis of left leg    Unspecified kidney failure    IDDM (insulin dependent diabetes mellitus)    Essential (primary) hypertension    Cellulitis    Chest pain at rest    Viral pericarditis    Exertional shortness of breath    Chest pain    Pleural effusion    Chronic renal failure, stage 5    Acute calculous cholecystitis    Acute pain of left knee    Bilateral low back  pain with sciatica    Dependence on renal dialysis    Diabetic neuropathy    Dvt femoral (deep venous thrombosis)    Edema    Elevated LFTs    Hyperkalemia    Hyperlipidemia    Hypertensive renal disease    Neuropathy    Primary localized osteoarthritis    Spondylosis of lumbosacral region without myelopathy or radiculopathy    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 (Patient taking differently: Take 1,000 mg by mouth every 12 (twelve) hours as needed   ) 30 tablet 0    aspirin 81 MG tablet Take 81 mg by mouth daily.        atorvastatin (LIPITOR) 10 MG tablet TAKE 1 TABLET(10 MG) BY MOUTH EVERY MORNING 90 tablet 0    BD Pen Needle Nano 2nd Gen 32G X 4 MM Misc USE WITH INSULIN PEN TID      calcium acetate (PHOSLO) 667 MG capsule TAKE 1 CAPSULE(667 MG) BY MOUTH THREE TIMES DAILY WITH MEALS 90 capsule 0    Colchicine 0.6 MG Cap TK 1 C PO BID PRN FOR GOUT       Febuxostat (ULORIC) 40 MG tablet Take 40 mg by mouth every morning         gabapentin (NEURONTIN) 300 MG capsule TAKE 2 CAPSULES(600 MG) BY MOUTH THREE TIMES DAILY 540 capsule 0    indomethacin (INDOCIN) 50 MG capsule Take 50 mg by mouth 2 (two) times daily with meals      insulin detemir (LEVEMIR) 100 UNIT/ML injection Inject 10 Units into the skin nightly (Patient taking differently: Inject 10 Units into the skin nightly as needed   ) 3 pen 1    insulin lispro (HumaLOG) 100 UNIT/ML injection Inject 5 Units into the skin 3 (three) times daily before meals      midodrine (PROAMATINE) 5 MG tablet Take 10 mg by mouth Once every Monday, Wednesday and Friday morning Before dialysis      Multiple Vitamin (MULTIVITAMIN PO) Take 1 tablet by mouth daily         nystatin (MYCOSTATIN) ointment APPLY TWICE DAILY X 10 DAYS 30 g 0    SITagliptin (JANUVIA) 25 MG tablet Take 25 mg by mouth every morning      triamcinolone (KENALOG) 0.1 % cream APPLY EXTERNALLY TO THE AFFECTED AREA TWICE DAILY 80 g 1    vitamin D (CHOLECALCIFEROL) 25 MCG (1000 UT) tablet Take 1,000 Units by mouth daily            Allergies:    Allergies   Allergen Reactions    Penicillins Hives     Onset date: 11-24-2011    Shrimp [Shellfish Allergy] Swelling     Also allergic to crab             Past Surgical History:  Past Surgical History:   Procedure Laterality Date    AV FISTULA PLACEMENT  2012    CHOLECYSTECTOMY      COLONOSCOPY  05/22/2018    Results: normal. Reported by Patient.    HYSTERECTOMY      INSERTION [facility] Left 07/07/2019    REVISION, A-V FISTULA UPPER EXTREMITY Left 07/21/2019    Procedure: REVISION, A-V FISTULA UPPER EXTREMITY, WITH PATCH ANGIOPLASTY;  Surgeon: [name], MD;  Location: [place] MAIN OR;  Service: Vascular;  Laterality: Left;    THROMBECTOMY Left 07/21/2019    Procedure: THROMBECTOMY;  Surgeon: [name], MD;  Location: [place] MAIN OR;  Service: Vascular;  Laterality: Left;            Family History:    Family History   Problem Relation Age of Onset    Diabetes Mother     Cancer Father         throat           Social History:    Social History     Tobacco Use    Smoking status: Never Smoker    Smokeless tobacco: Never Used   Substance Use Topics    Alcohol use: No    Drug use: Never          The following sections were reviewed this encounter by the provider:   Tobacco   Allergies   Meds   Problems   Med Hx   Surg Hx   Fam Hx              Vital Signs:    Ht 1.6 m (5\' 3" )    Wt 88.3 kg (194 lb 10.7 oz)    BMI 34.48 kg/m          ROS:    Review of Systems   Constitutional: Positive for fatigue. Negative for appetite change, chills and fever.   HENT: Positive for hearing loss.    Respiratory: Negative for cough and shortness of breath.    Cardiovascular: Positive for leg swelling (stable). Negative for chest pain and palpitations.   Gastrointestinal: Negative for abdominal pain, constipation, diarrhea, nausea and vomiting.   Musculoskeletal: Positive for arthralgias.        Leg and foot pain   Skin: Positive for wound (seeing podiatry).   Neurological: Positive for numbness (neuropathy stable). Negative for dizziness, weakness and headaches.              Physical Exam:    Physical Exam  Vitals reviewed.   Constitutional:       General: She is not in acute distress.  Pulmonary:      Comments: No audible wheezing or stridor  No cough  Speaking in full sentences without difficulty  Musculoskeletal:      Right lower leg: Edema (trace) present.      Left lower leg: Edema (trace) present.      Comments: Exam limited due to patient's limited mobility with maneuvering the camera; unable to visualize ulcers   Neurological:      Mental Status: She is alert and oriented to person, place, and time.      Comments: Speech normal  Some difficulty with comprehension  Memory impaired   Psychiatric:         Mood and Affect: Mood normal.      Comments: Requires frequent redirection                Assessment:  1. Diabetic polyneuropathy associated with type 2 diabetes mellitus    2. 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 Chronic renal failure, stage 5    4. Dependence on renal dialysis    5. Long-term insulin use          Plan:    Form completed  Continue to follow up with nephrology, podiatry and vascular surgery  Will obtain recent labs from nephrology  Follow up in 1 month in office        Follow-up:    Return in about 1 month (around 06/11/2020) for diabetes.         Ferol Luz, MD

## 2020-05-11 NOTE — Pre-Procedure Instructions (Signed)
Pt left VM [phone] requesting call back for an issue.   Pt called back and discussed Januvia on her profile, she needs to hold AM DOS. She can continue to take it until her DOS. Pt verbalizes understanding and states no further questions at this time.

## 2020-05-14 ENCOUNTER — Ambulatory Visit
Admission: RE | Admit: 2020-05-14 | Discharge: 2020-05-14 | Disposition: A | Discharge status: Home / Self Care | Payer: Medicare Other | Source: Ambulatory Visit | Attending: Specialist | Admitting: Specialist

## 2020-05-14 ENCOUNTER — Ambulatory Visit: Payer: Medicare Other | Admitting: Certified Registered"

## 2020-05-14 ENCOUNTER — Encounter: Payer: Self-pay | Admitting: Specialist

## 2020-05-14 ENCOUNTER — Ambulatory Visit: Payer: Medicare Other

## 2020-05-14 ENCOUNTER — Encounter
Admission: RE | Disposition: A | Discharge status: Home / Self Care | Payer: Self-pay | Source: Ambulatory Visit | Attending: Specialist

## 2020-05-14 ENCOUNTER — Encounter (INDEPENDENT_AMBULATORY_CARE_PROVIDER_SITE_OTHER): Payer: Self-pay | Admitting: Student in an Organized Health Care Education/Training Program

## 2020-05-14 DIAGNOSIS — N185 Chronic kidney disease, stage 5: Secondary | ICD-10-CM

## 2020-05-14 DIAGNOSIS — E1122 Type 2 diabetes mellitus with diabetic chronic kidney disease: Secondary | ICD-10-CM | POA: Insufficient documentation

## 2020-05-14 DIAGNOSIS — I120 Hypertensive chronic kidney disease with stage 5 chronic kidney disease or end stage renal disease: Secondary | ICD-10-CM | POA: Insufficient documentation

## 2020-05-14 DIAGNOSIS — Y832 Surgical operation with anastomosis, bypass or graft as the cause of abnormal reaction of the patient, or of later complication, without mention of misadventure at the time of the procedure: Secondary | ICD-10-CM | POA: Insufficient documentation

## 2020-05-14 DIAGNOSIS — N186 End stage renal disease: Secondary | ICD-10-CM | POA: Insufficient documentation

## 2020-05-14 DIAGNOSIS — Z7982 Long term (current) use of aspirin: Secondary | ICD-10-CM | POA: Insufficient documentation

## 2020-05-14 DIAGNOSIS — Z992 Dependence on renal dialysis: Secondary | ICD-10-CM | POA: Insufficient documentation

## 2020-05-14 DIAGNOSIS — Z86718 Personal history of other venous thrombosis and embolism: Secondary | ICD-10-CM | POA: Insufficient documentation

## 2020-05-14 DIAGNOSIS — E785 Hyperlipidemia, unspecified: Secondary | ICD-10-CM | POA: Insufficient documentation

## 2020-05-14 DIAGNOSIS — K219 Gastro-esophageal reflux disease without esophagitis: Secondary | ICD-10-CM | POA: Insufficient documentation

## 2020-05-14 DIAGNOSIS — T82858A Stenosis of vascular prosthetic devices, implants and grafts, initial encounter: Secondary | ICD-10-CM

## 2020-05-14 DIAGNOSIS — T82590A Other mechanical complication of surgically created arteriovenous fistula, initial encounter: Secondary | ICD-10-CM | POA: Insufficient documentation

## 2020-05-14 DIAGNOSIS — Z794 Long term (current) use of insulin: Secondary | ICD-10-CM | POA: Insufficient documentation

## 2020-05-14 HISTORY — PX: FISTULOGRAM: SHX4117

## 2020-05-14 HISTORY — PX: REVISION, A-V FISTULA UPPER EXTREMITY: SHX5416

## 2020-05-14 LAB — GLUCOSE WHOLE BLOOD - POCT
Whole Blood Glucose POCT: 71 mg/dL (ref 70–100)
Whole Blood Glucose POCT: 67 mg/dL — ABNORMAL LOW (ref 70–100)
Whole Blood Glucose POCT: 77 mg/dL (ref 70–100)
Whole Blood Glucose POCT: 115 mg/dL — ABNORMAL HIGH (ref 70–100)
Whole Blood Glucose POCT: 98 mg/dL (ref 70–100)

## 2020-05-14 LAB — POTASSIUM WHOLE BLOOD: Whole Blood Potassium: 3.7 mEq/L (ref 3.6–5.0)

## 2020-05-14 SURGERY — FISTULOGRAM
Anesthesia: Anesthesia MAC / Sedation | Site: Arm Upper | Laterality: Left | Wound class: Clean

## 2020-05-14 MED ORDER — PROPOFOL 10 MG/ML IV EMUL (WRAP)
INTRAVENOUS | Status: AC
Start: 2020-05-14 — End: ?
  Filled 2020-05-14: qty 20

## 2020-05-14 MED ORDER — SODIUM CHLORIDE 0.9% BAG (IRRIGATION USE)
INTRAVENOUS | Status: DC | PRN
Start: 2020-05-14 — End: 2020-05-14
  Administered 2020-05-14: 1000 mL

## 2020-05-14 MED ORDER — KETAMINE HCL 50 MG/ML IJ SOLN
INTRAMUSCULAR | Status: DC | PRN
Start: 2020-05-14 — End: 2020-05-14
  Administered 2020-05-14 (×2): 12.5 mg via INTRAVENOUS

## 2020-05-14 MED ORDER — FENTANYL CITRATE (PF) 50 MCG/ML IJ SOLN (WRAP)
INTRAMUSCULAR | Status: DC | PRN
Start: 2020-05-14 — End: 2020-05-14
  Administered 2020-05-14: 25 ug via INTRAVENOUS

## 2020-05-14 MED ORDER — OXYCODONE-ACETAMINOPHEN 5-325 MG PO TABS
1.0000 | ORAL_TABLET | ORAL | 0 refills | Status: DC | PRN
Start: 2020-05-14 — End: 2020-06-12

## 2020-05-14 MED ORDER — HEPARIN SODIUM (PORCINE) 5000 UNIT/ML IJ SOLN
INTRAMUSCULAR | Status: DC | PRN
Start: 2020-05-14 — End: 2020-05-14
  Administered 2020-05-14: 15:00:00 500 mL

## 2020-05-14 MED ORDER — EPHEDRINE SULFATE 50 MG/ML IJ/IV SOLN (WRAP)
Status: DC | PRN
Start: 2020-05-14 — End: 2020-05-14
  Administered 2020-05-14: 10 mg via INTRAVENOUS

## 2020-05-14 MED ORDER — FENTANYL CITRATE (PF) 50 MCG/ML IJ SOLN (WRAP)
INTRAMUSCULAR | Status: AC
Start: 2020-05-14 — End: ?
  Filled 2020-05-14: qty 2

## 2020-05-14 MED ORDER — DEXTROSE 50 % IV SOLN
INTRAVENOUS | Status: DC | PRN
Start: 2020-05-14 — End: 2020-05-14
  Administered 2020-05-14: 25 mL via INTRAVENOUS

## 2020-05-14 MED ORDER — DEXTROSE-NACL 5-0.9 % IV BOLUS
100.0000 mL | Freq: Once | INTRAVENOUS | Status: AC
Start: 2020-05-14 — End: 2020-05-14
  Administered 2020-05-14: 13:00:00 100 mL via INTRAVENOUS

## 2020-05-14 MED ORDER — IODIXANOL 320 MG/ML IV SOLN
INTRAVENOUS | Status: DC | PRN
Start: 2020-05-14 — End: 2020-05-14
  Administered 2020-05-14: 35 mL

## 2020-05-14 MED ORDER — LIDOCAINE-EPINEPHRINE 1 %-1:100000 IJ SOLN
INTRAMUSCULAR | Status: AC
Start: 2020-05-14 — End: ?
  Filled 2020-05-14: qty 20

## 2020-05-14 MED ORDER — SODIUM CHLORIDE 0.9 % IV SOLN
INTRAVENOUS | Status: DC
Start: 2020-05-14 — End: 2020-05-14

## 2020-05-14 MED ORDER — ONDANSETRON HCL 4 MG/2ML IJ SOLN
4.0000 mg | Freq: Once | INTRAMUSCULAR | Status: DC | PRN
Start: 2020-05-14 — End: 2020-05-14

## 2020-05-14 MED ORDER — LIDOCAINE HCL 2 % IJ SOLN
INTRAMUSCULAR | Status: DC | PRN
Start: 2020-05-14 — End: 2020-05-14
  Administered 2020-05-14: 60 mg

## 2020-05-14 MED ORDER — DEXTROSE 50 % IV SOLN
INTRAVENOUS | Status: AC
Start: 2020-05-14 — End: ?
  Filled 2020-05-14: qty 50

## 2020-05-14 MED ORDER — GELATIN ABSORBABLE 12-7 MM EX MISC
CUTANEOUS | Status: DC | PRN
Start: 2020-05-14 — End: 2020-05-14
  Administered 2020-05-14: 1 via TOPICAL

## 2020-05-14 MED ORDER — SODIUM CHLORIDE 0.9 % IV SOLN
INTRAVENOUS | Status: DC | PRN
Start: 2020-05-14 — End: 2020-05-14

## 2020-05-14 MED ORDER — EPHEDRINE SULFATE 50 MG/ML IJ/IV SOLN (WRAP)
Status: AC
Start: 2020-05-14 — End: ?
  Filled 2020-05-14: qty 1

## 2020-05-14 MED ORDER — ACETAMINOPHEN 325 MG PO TABS
650.0000 mg | ORAL_TABLET | Freq: Once | ORAL | Status: DC | PRN
Start: 2020-05-14 — End: 2020-05-14

## 2020-05-14 MED ORDER — GELATIN ABSORBABLE 12-7 MM EX MISC
CUTANEOUS | Status: AC
Start: 2020-05-14 — End: ?
  Filled 2020-05-14: qty 1

## 2020-05-14 MED ORDER — PROPOFOL INFUSION 10 MG/ML
INTRAVENOUS | Status: DC | PRN
Start: 2020-05-14 — End: 2020-05-14
  Administered 2020-05-14: 50 ug/kg/min via INTRAVENOUS

## 2020-05-14 MED ORDER — OXYCODONE HCL 5 MG PO TABS
5.0000 mg | ORAL_TABLET | Freq: Once | ORAL | Status: DC | PRN
Start: 2020-05-14 — End: 2020-05-14

## 2020-05-14 MED ORDER — CEFAZOLIN SODIUM 1 G IJ SOLR
INTRAMUSCULAR | Status: DC | PRN
Start: 2020-05-14 — End: 2020-05-14
  Administered 2020-05-14: 2 g via INTRAVENOUS

## 2020-05-14 MED ORDER — BUPIVACAINE-EPINEPHRINE (PF) 0.5% -1:200000 IJ SOLN
INTRAMUSCULAR | Status: AC
Start: 2020-05-14 — End: ?
  Filled 2020-05-14: qty 20

## 2020-05-14 MED ORDER — KETAMINE HCL 50 MG/ML IJ SOLN
INTRAMUSCULAR | Status: AC
Start: 2020-05-14 — End: ?
  Filled 2020-05-14: qty 0.5

## 2020-05-14 MED ORDER — AMMONIA AROMATIC IN INHA
1.0000 | Freq: Once | RESPIRATORY_TRACT | Status: DC | PRN
Start: 2020-05-14 — End: 2020-05-14

## 2020-05-14 MED ORDER — FENTANYL CITRATE (PF) 50 MCG/ML IJ SOLN (WRAP)
25.0000 ug | INTRAMUSCULAR | Status: DC | PRN
Start: 2020-05-14 — End: 2020-05-14

## 2020-05-14 MED ORDER — HEPARIN SODIUM (PORCINE) 5000 UNIT/ML IJ SOLN
INTRAMUSCULAR | Status: AC
Start: 2020-05-14 — End: ?
  Filled 2020-05-14: qty 1

## 2020-05-14 MED ORDER — LIDOCAINE-EPINEPHRINE 1 %-1:100000 IJ SOLN
INTRAMUSCULAR | Status: DC | PRN
Start: 2020-05-14 — End: 2020-05-14
  Administered 2020-05-14: 15:00:00 14 mL via INTRAMUSCULAR

## 2020-05-14 SURGICAL SUPPLY — 81 items
ADHESIVE SKIN CLOSURE DERMABOND ADVANCED (Skin Closure) ×1 IMPLANT
ADHESIVE SKIN CLOSURE DERMABOND ADVANCED .7 ML LIQUID APPLICATOR (Skin Closure) ×1 IMPLANT
ADHESIVE SKNCLS 2 OCTYL CYNCRLT .7ML (Skin Closure) ×2
BANDAGE CMPR PLSTR CTTN SFWRP 5YDX6IN LF (Procedure Accessories) ×2
BANDAGE COMPRESSION L5 YD X W6 IN ELASTIC CLIP CLOSURE POLYESTER (Procedure Accessories) IMPLANT
BANDAGE ESMARK COMP L12 FT X W6 IN ELASTIC PWDR FREE DRESSING BLUE (Procedure Accessories) IMPLANT
BANDAGE ESMARK COMPRESSION L12 FT X W6 (Procedure Accessories) ×1 IMPLANT
BANDAGE MEDLINE COMPRESSION L5 YD X W6 (Procedure Accessories) ×1 IMPLANT
BDG ELSTC ESMRK STL 6X12 (Procedure Accessories) ×2
CATHETER BALLOON DILATATION L4 CM L75 CM (Balloons) ×1 IMPLANT
CATHETER BLNDIL CONQ 8MM 75CM RADOPQ RPD (Balloons) ×2
CATHETER EMBL FGRTY 4FR 9MM 40CM RND TIP (Catheter) ×2 IMPLANT
CATHETER EMBL SS SIL FGRTY 3FR 5MM 40CM (Catheter) ×2 IMPLANT
CATHETER EMBOLECTOMY 40CM 4FR 9MM FOGARTY ARTERIAL ROUND TIP (Catheter) IMPLANT
CATHETER EMBOLECTOMY OD3 FR ODSEC5 MM L40 CM BALLOON OTW RADIOPAQUE (Catheter) IMPLANT
CATHETER ODSEC8 MM L75 CM CONQUEST BALLOON DILATATION L4 CM RADIOPAQUE (Balloons) IMPLANT
CLIP INTERNAL SMALL WIDE CHEVRON HEART (Clips) IMPLANT
CLIP INTERNAL SMALL WIDE CHEVRON LIGATE TRIANGULATE CROSS SECTION (Clips) IMPLANT
CLIP INTNL TI SM W CHEVRON HRT WECK HRZN (Clips)
CONTAINER SPEC PE 4OZ PRCS 2.5X3IN LF (Procedure Accessories) ×2 IMPLANT
COVER PRB ISOSILK ULTRACOVER 48X5IN LF (Procedure Accessories) ×2 IMPLANT
COVER PROBE ULTRACOVER 48X5IN TELESCOPIC FOLD TAPE STRIP BAND (Procedure Accessories) ×1 IMPLANT
CUFF TOURNIQUET CYLINDRICAL L18 IN X W4 IN 2 PORT BLADDER QUICK (Cuff) IMPLANT
DEVICE INFL 25 ATM INDFLTR 35.5CM DGT (Procedure Accessories) ×2
DEVICE INFLATION L35.5 CM INDEFLATOR 25 (Procedure Accessories) ×1 IMPLANT
DEVICE INFLATION L35.5 CM INDEFLATOR 25 ATM DIGITAL ANGIOPLASTY (Procedure Accessories) IMPLANT
DRAPE 74X41IN UNIVERSAL POLY XRAY C ARM CLOSURE STRAP (Drape) ×1 IMPLANT
DRAPE EQP NS FTSWTCH BG DRW STNG (Drape) ×2 IMPLANT
DRAPE EQP VLCR POLY UNV STRDRP 74X41IN (Drape) ×2 IMPLANT
DRAPE FOOTSWITCH BAG DRAW STRING EQUIPMENT NONSTERILE (Drape) ×1 IMPLANT
DRESSING TRANSPARENT L4 3/4 IN X W4 IN (Dressing) IMPLANT
DRESSING TRANSPARENT L4 3/4 IN X W4 IN POLYURETHANE ADHESIVE (Dressing) IMPLANT
DRESSING TRNS PU STD TGDRM 4.75X4IN LF (Dressing)
ELECTRODE ADULT PATIENT RETURN L9 FT REM POLYHESIVE ACRYLIC FOAM (Procedure Accessories) ×1 IMPLANT
ELECTRODE PATIENT RETURN L9 FT VALLEYLAB (Procedure Accessories) ×1 IMPLANT
ELECTRODE PT RTN RM PHSV ACRL FM C30- LB (Procedure Accessories) ×2
GLOVE SRG NTR RBR 8 BGL SRG LTX STRL PF (Glove) ×4
GLOVE SURGICAL 8 BIOGEL SURGEONS POWDER (Glove) ×2 IMPLANT
GLOVE SURGICAL 8 BIOGEL SURGEONS POWDER FREE BEAD CUFF TEXTURE SURFACE (Glove) ×2 IMPLANT
GUIDEWIRE VASC ANG STORQ .035IN 180CM LF (Guidwire) ×2 IMPLANT
GUIDEWIRE VASCULAR OD.035 IN L180 CM STORQ ANGLE SOFT TIP (Guidwire) ×1 IMPLANT
INTRODUCER SHEATH OD7 FR L11 CM MINI (Sheaths) ×1 IMPLANT
INTRODUCER SHEATH OD7 FR L11 CM MINI GUIDEWIRE AVANTI+ STAINLESS STEEL (Sheaths) ×1 IMPLANT
INTRODUCER SHTH SS .038IN STDLN AVNT+ 7 (Sheaths) ×2
KIT INFECTION CONTROL CUSTOM (Kits) ×2 IMPLANT
KIT INFECTION CONTROL CUSTOM IFOH03 (Kits) ×1 IMPLANT
KIT INTRO .018IN MERIT S-MAK 5FR 21GA 10 (Introducer) ×2
KIT INTRODUCER L10 CM .018 IN STIFFENED (Introducer) ×1 IMPLANT
KIT INTRODUCER L10 CM .018 IN STIFFENED DILATOR STAINLESS STEEL (Introducer) ×1 IMPLANT
MANIFOLD SCT 2 STD NPTN 2 LF NS 4 PORT (Filter) ×2
MANIFOLD SUCTION 2 STANDARD 4 PORT (Filter) ×1 IMPLANT
MANIFOLD SUCTION 2 STANDARD 4 PORT NEPTUNE 2 WASTE MANAGEMENT SYSTEM (Filter) IMPLANT
PATCH VASCULAR L9 CM X W2 CM (Graft) ×1 IMPLANT
PATCH VASCULAR L9 CM X W2 CM NONPYROGENIC XENOSURE BOVINE PERICARDIAL (Graft) IMPLANT
PATCH XENOSURE BIOLOGIC 2X9CM (Graft) ×2 IMPLANT
SET EXTENSION L29 IN STRAIGHT MALE LUER (IV Supply) ×1 IMPLANT
SET EXTENSION L29 IN STRAIGHT MALE LUER LOCK ADAPTER STANDARD BORE (IV Supply) ×1 IMPLANT
SET XTN DEHP 4ML STRG 29IN LF STRL M LL (IV Supply) ×2
SOLUTION IV 0.9% NACL 500ML VFLX LF PLS (IV Solutions) ×2
SOLUTION IV 0.9% SODIUM CHLORIDE 500 ML (IV Solutions) ×1 IMPLANT
SOLUTION IV 0.9% SODIUM CHLORIDE 500 ML PLASTIC CONTAINER (IV Solutions) ×1 IMPLANT
STOPCOCK IV 4 WAY LARGE BORE ROTATE MALE (IV Supply) ×2 IMPLANT
STOPCOCK IV 4 WAY LARGE BORE ROTATE MALE LUER LOCK ADAPTER LIPID (IV Supply) ×1 IMPLANT
STOPCOCK IV LF STRL 4W LG BORE ROT M LL (IV Supply) ×4
STRAP PSTN VLCR DVN 60X3IN LF KN DISP (Procedure Accessories) ×2 IMPLANT
SUTURE NABSB 5-0 BV-1 PRLN HMSL 24IN 2 (Suture)
SUTURE NABSB 5-0 C-1 PRLN 36IN 2 ARM MFL (Suture) ×1 IMPLANT
SUTURE PROLENE WITH HEMOSEAL BLUE 5-0 (Suture) IMPLANT
SUTURE PROLENE WITH HEMOSEAL BLUE 5-0 BV-1 L24 IN 2 ARM MONOFILAMENT (Suture) IMPLANT
SYRINGE 1 ML GRADUATE LOK MDCL (Syringes, Needles) IMPLANT
SYRINGE 1 ML GRADUATE LOK MEDICAL (Syringes, Needles) ×1
SYRINGE 10 ML GRADUATE NONPYROGENIC DEHP (Syringes, Needles) ×2 IMPLANT
SYRINGE 10 ML GRADUATE NONPYROGENIC DEHP FREE PVC FREE LOK MEDICAL (Syringes, Needles) ×2 IMPLANT
SYRINGE 30 ML CONCENTRIC TIP GRADUATE (Syringes, Needles) ×2 IMPLANT
SYRINGE 30 ML CONCENTRIC TIP GRADUATE NONPYROGENIC DEHP FREE LOK (Syringes, Needles) ×2 IMPLANT
SYRINGE MED 10ML LL LF STRL GRAD N-PYRG (Syringes, Needles) ×4
SYRINGE MED 30ML LL LF STRL CONC TIP (Syringes, Needles) ×4
SYRINGE MED LL LF STRL GRAD DISP (Syringes, Needles) ×2
TOURNIQUET REPRC 18X4IN RED (Cuff) ×2 IMPLANT
TRAY SRG VASCULAR IFOH (Pack) ×2 IMPLANT
TRAY SURGICAL VASCULAR (Pack) ×1 IMPLANT

## 2020-05-14 NOTE — Nursing Progress Note (Signed)
The patient expressed readiness to leave. D/C instructions and med list explained to the patient and her daughter Arbie Cookey. Deny questions. All belongings with the patient. Escorted via wheelchair to car.

## 2020-05-14 NOTE — Transfer of Care (Signed)
Anesthesia Transfer of Care Note    Patient: Mary [name]    Procedures performed: Procedure(s):  REVISION, A-V FISTULA UPPER EXTREMITY WITH PATCH ANGIOPLASTY  FISTULOGRAM WITH BALLOON ANGIOPLASTY    Anesthesia type: General TIVA    Patient location:Phase II PACU    Last vitals:   Vitals:    05/14/20 1102   BP: 131/62   Pulse: 72   Resp: 16   Temp: 36.5 C (97.7 F)       Post pain: Patient not complaining of pain, continue current therapy      Mental Status:awake    Respiratory Function: tolerating room air    Cardiovascular: stable    Nausea/Vomiting: patient not complaining of nausea or vomiting    Hydration Status: adequate    Post assessment: no reportable events    Signed by: Johna Roles  05/14/20 4:16 PM

## 2020-05-14 NOTE — Anesthesia Preprocedure Evaluation (Addendum)
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      pertinent labs reviewed             Signed by: Armoni Kludt Donell Sievert 05/14/20 11:48 AM

## 2020-05-14 NOTE — Progress Notes (Signed)
Anesthesia notified of blood sugar 67,new order received.

## 2020-05-14 NOTE — Op Note (Signed)
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    Operative Notes:   Patient was brought to the operating suite was placed in supine position 2 g of Ancef was given.  Left arm was prepped draped in sterile fashion.  Micropuncture was used and then 7 [place] sheath was inserted a fistulogram was obtained which showed area of severe stenosis in the proximal aspect of the previously placed stents which was throughout the entire cephalic vein and also a severe stenosis near the arterial anastomosis.  Therefore 8 mm x 4 cm conquest balloon was used the entire length of the stenotic cephalic vein from near the arch down to the midportion was balloon angioplastied follow-up angiogram revealed excellent results 3-0 Monocryl pursestring suture was placed around the sheath and sheath was removed.  Then local anesthesia was administered near the arterial anastomosis and a 5 cm incision was made the fistula was dissected out tourniquet was applied proximally venotomy was made using 11 blade and Potts scissors and there was severe area of stenosis just proximal to the anastomosis 2 cm pericardial patch was used with 5-0 Prolene a patch  angioplasty was performed flow was stored there was excellent thrill present wound was closed using 3-0 Monocryl in running fashion skin was closed using 4-0 Monocryl in subcuticular fashion.  Dermabond was applied patient taught procedure well was transferred car room.    Estimated Blood Loss:   * No values recorded between 05/14/2020  2:32 PM and 05/14/2020  4:08 PM *    Implants:     Implant Name Type Inv. Item Serial No. Manufacturer Lot No. LRB No. Used Action   PATCH [employer] [id number] - [id number] Graft PATCH [employer]IC 2X9CM  LEMAITRE VASCULAR [id number] Left 1 Implanted       Drains:       Specimens:       Complications:       Signed by: [name], MD

## 2020-05-14 NOTE — Discharge Instr - AVS First Page (Addendum)
ACCESS DISCHARGE INSTRUCTIONS    Surgeon: Francy Mcilvaine MD    Wound Care:  If there is an ACE please remove in AM.  If there are dressings please remove them in 72 hours and wet the incisions  If there are Steri-Strips please remove them in 5 days     Blood Thinners:  If you are taking aspirin or Plavix you may resume as scheduled  If you are taking anticoagulation therapy (Eliquis, Xarelto etc) please restart 48 hours postoperatively.  If you are taking warfarin you may restart on the day of the procedure.        Pain Control:  Some pain and swelling is normal after surgery.  You may take the pain medication as prescribed.        Driving:  When pain is controlled and not taking pain medications      Diet:   You may not feel hungry all the time or you may eat a small amount and feel full.  This is ok.  Some people find eating multiple small meals is easier than 3 large meals.  Continue to drink lots of clear fluids.      Reasons to call the doctor:  Call your doctor or go to emergency room if you have any of the following:  Fever greater than 101.5 F  Pus draining from your wound  Redness around your wound that is spreading  You may not drive or do anything requiring coordination or balance for 24 hours.    Avoid heavy lifting for 1 weeks after any surgery.   Persistent bleeding, swelling or pus at the operative site.  Loss of feeling, severe pain, inability to move fingers or cold hand.    Follow up Care:  Call the office [phone] and make an appointment  To have a duplex US of the fistula in my office in 4 weeks and see me.

## 2020-05-14 NOTE — Anesthesia Postprocedure Evaluation (Signed)
Anesthesia Post Evaluation    Patient: Mary [name]    Procedure(s):  REVISION, A-V FISTULA UPPER EXTREMITY WITH PATCH ANGIOPLASTY  FISTULOGRAM WITH BALLOON ANGIOPLASTY    Anesthesia type: MAC    Last Vitals:   Vitals Value Taken Time   BP 139/66 05/14/20 1620   Temp 36.7 C (98.1 F) 05/14/20 1617   Pulse 73 05/14/20 1620   Resp 12 05/14/20 1617   SpO2 97 % 05/14/20 1620                 Anesthesia Post Evaluation:     Patient Evaluated: PACU  Patient Participation: complete - patient participated  Level of Consciousness: sleepy but conscious  Pain Score: 0  Pain Management: adequate    Airway Patency: patent    Anesthetic complications: No      PONV Status: none    Cardiovascular status: acceptable  Respiratory status: acceptable  Hydration status: acceptable        Signed by: Edson Snowball, 05/14/2020 4:34 PM

## 2020-05-14 NOTE — H&P (Signed)
History and Physical currently available, in Epic or on paper, has been reviewed, and there are no major changes. Pt. seen and examined by me prior to procedure.     Bernedette Auston A Tremaine Earwood, MD

## 2020-05-15 ENCOUNTER — Encounter: Payer: Self-pay | Admitting: Specialist

## 2020-05-15 ENCOUNTER — Other Ambulatory Visit (INDEPENDENT_AMBULATORY_CARE_PROVIDER_SITE_OTHER): Payer: Self-pay | Admitting: Student in an Organized Health Care Education/Training Program

## 2020-05-18 ENCOUNTER — Telehealth (INDEPENDENT_AMBULATORY_CARE_PROVIDER_SITE_OTHER): Payer: Self-pay | Admitting: Student in an Organized Health Care Education/Training Program

## 2020-05-18 NOTE — Telephone Encounter (Signed)
Patient dropped off lab order results per provider.

## 2020-05-22 NOTE — Telephone Encounter (Signed)
Labs in you in-box for your review. Thanks

## 2020-05-23 NOTE — Telephone Encounter (Signed)
Labs reviewed, will scan into chart. I would recommend checking her cholesterol when she comes in for her follow up. She does not need to fast

## 2020-05-23 NOTE — Telephone Encounter (Signed)
Spoke with patient and informed of message per Dr.White. patient verbalized understanding. Patient wanted later appointment time. Changed appointment for 06/12/2020 at 1:30 .

## 2020-05-24 ENCOUNTER — Telehealth (INDEPENDENT_AMBULATORY_CARE_PROVIDER_SITE_OTHER): Payer: Self-pay

## 2020-05-24 NOTE — Telephone Encounter (Signed)
-   Patient LVM asking about suture removal.  - Called back and LVM advising that suture is dissolvable, and that she may need to rub visible portion of suture to remove it, as internal portion may have already dissolved.  - Provided x [phone] for call back.

## 2020-05-28 ENCOUNTER — Encounter (INDEPENDENT_AMBULATORY_CARE_PROVIDER_SITE_OTHER): Payer: Self-pay | Admitting: Student in an Organized Health Care Education/Training Program

## 2020-05-28 NOTE — Telephone Encounter (Signed)
Patient called back.  - Advised of below regarding suture removal.   - Patient states hand is sore. Denies coolness, color change or loss of motor function.  - Advised use of stress ball, 30 squeezes TID to start.  - Patient verbalized understanding of instructions and had no questions or concerns at this time.

## 2020-06-04 ENCOUNTER — Encounter (INDEPENDENT_AMBULATORY_CARE_PROVIDER_SITE_OTHER): Payer: Self-pay

## 2020-06-04 NOTE — Progress Notes (Signed)
Room:         Appt Date/Time: 06/22@ 11:40am    74 y.o. female   PCP: Ferol Luz, MD  Referring Physician:    New Patient Follow-Up  Post-op X   [place] Today X  Hemo 11a [place] - [facility] Other Imaging     Chief Complaint/HPI:   -post op Left arm fistulogram and revision 05/14/20  -discuss U/S results.    Date of Last Visit: 04/18/20    Allergies:   Allergies   Allergen Reactions    Penicillins Hives     Onset date: 11-24-2011    Shrimp [Shellfish Allergy] Swelling     Also allergic to crab          Selected Medications:    Coumadin Xarelto Eliquis Pradaxa Lovenox Other thinner:    Plavix Aspirin Brillinta Effient  Pletal [place] Fish Oil   Insulin; Detemir, Lispro Metformin: Januvia Other Diabetes Atorvastatin Rosuvastatin Other Chol:    MHx:  Stroke / TIA / Seizures HTN / HLD  Diabetes   MI / CAD A-fib / Arrhythmia  COPD / Asthma / (other lung)   Kidney Disease Liver Disease DVT / Coagulopathy   Wounds Spine / other MSK  Cancer            Smoker           Former Smoker      X    Never Smoker    Vital Signs this Visit Pulses  HT  BP R BP L Temp   Rad Ulnar Brach Fem Pop DP PT        R          WT  Pulse R Pulse L SpO2       L          Imaging results:        Plan of Care:

## 2020-06-11 ENCOUNTER — Telehealth (INDEPENDENT_AMBULATORY_CARE_PROVIDER_SITE_OTHER): Payer: Self-pay | Admitting: Specialist

## 2020-06-11 NOTE — Telephone Encounter (Signed)
06/11/20- lvm to ask pt to arrive 15 min early for [place] on 6/22- CS

## 2020-06-12 ENCOUNTER — Ambulatory Visit (INDEPENDENT_AMBULATORY_CARE_PROVIDER_SITE_OTHER): Payer: Medicare Other

## 2020-06-12 ENCOUNTER — Other Ambulatory Visit (INDEPENDENT_AMBULATORY_CARE_PROVIDER_SITE_OTHER): Payer: 59

## 2020-06-12 ENCOUNTER — Ambulatory Visit (INDEPENDENT_AMBULATORY_CARE_PROVIDER_SITE_OTHER): Payer: Medicare Other | Admitting: Student in an Organized Health Care Education/Training Program

## 2020-06-12 ENCOUNTER — Ambulatory Visit (INDEPENDENT_AMBULATORY_CARE_PROVIDER_SITE_OTHER): Payer: 59 | Admitting: Student in an Organized Health Care Education/Training Program

## 2020-06-12 ENCOUNTER — Encounter (INDEPENDENT_AMBULATORY_CARE_PROVIDER_SITE_OTHER): Payer: Self-pay | Admitting: Student in an Organized Health Care Education/Training Program

## 2020-06-12 ENCOUNTER — Encounter (HOSPITAL_BASED_OUTPATIENT_CLINIC_OR_DEPARTMENT_OTHER): Payer: Medicare Other | Admitting: Specialist

## 2020-06-12 ENCOUNTER — Other Ambulatory Visit (HOSPITAL_BASED_OUTPATIENT_CLINIC_OR_DEPARTMENT_OTHER): Payer: Medicare Other

## 2020-06-12 VITALS — BP 142/76 | HR 72 | Temp 97.2°F | Ht 63.0 in | Wt 174.4 lb

## 2020-06-12 DIAGNOSIS — Z992 Dependence on renal dialysis: Secondary | ICD-10-CM

## 2020-06-12 DIAGNOSIS — R7989 Other specified abnormal findings of blood chemistry: Secondary | ICD-10-CM

## 2020-06-12 DIAGNOSIS — R1319 Other dysphagia: Secondary | ICD-10-CM

## 2020-06-12 DIAGNOSIS — R21 Rash and other nonspecific skin eruption: Secondary | ICD-10-CM

## 2020-06-12 DIAGNOSIS — I509 Heart failure, unspecified: Secondary | ICD-10-CM

## 2020-06-12 DIAGNOSIS — D539 Nutritional anemia, unspecified: Secondary | ICD-10-CM

## 2020-06-12 DIAGNOSIS — E1142 Type 2 diabetes mellitus with diabetic polyneuropathy: Secondary | ICD-10-CM

## 2020-06-12 DIAGNOSIS — N185 Chronic kidney disease, stage 5: Secondary | ICD-10-CM

## 2020-06-12 DIAGNOSIS — R1310 Dysphagia, unspecified: Secondary | ICD-10-CM

## 2020-06-12 LAB — HEPATIC FUNCTION PANEL
ALT: 23 U/L (ref 0–55)
AST (SGOT): 44 U/L — ABNORMAL HIGH (ref 5–34)
Albumin/Globulin Ratio: 0.9 (ref 0.9–2.2)
Albumin: 3.7 g/dL (ref 3.5–5.0)
Alkaline Phosphatase: 126 U/L — ABNORMAL HIGH (ref 37–106)
Bilirubin Direct: 0.2 mg/dL (ref 0.0–0.5)
Bilirubin Indirect: 0.3 mg/dL (ref 0.2–1.0)
Bilirubin, Total: 0.5 mg/dL (ref 0.2–1.2)
Globulin: 4.1 g/dL — ABNORMAL HIGH (ref 2.0–3.7)
Protein, Total: 7.8 g/dL (ref 6.0–8.3)

## 2020-06-12 LAB — LIPID PANEL
Cholesterol / HDL Ratio: 3.3
Cholesterol: 134 mg/dL (ref 0–199)
HDL: 41 mg/dL (ref 40–9999)
LDL Calculated: 63 mg/dL (ref 0–99)
Triglycerides: 151 mg/dL — ABNORMAL HIGH (ref 34–149)
VLDL Calculated: 30 mg/dL (ref 10–40)

## 2020-06-12 LAB — HEMOLYSIS INDEX: Hemolysis Index: 201 — ABNORMAL HIGH (ref 0–18)

## 2020-06-12 MED ORDER — NOVOLOG FLEXPEN 100 UNIT/ML SC SOPN
5.00 [IU] | PEN_INJECTOR | Freq: Three times a day (TID) | SUBCUTANEOUS | 3 refills | Status: DC
Start: 2020-06-12 — End: 2020-11-18

## 2020-06-12 MED ORDER — TRIAMCINOLONE ACETONIDE 0.1 % EX CREA
TOPICAL_CREAM | CUTANEOUS | 1 refills | Status: DC
Start: 2020-06-12 — End: 2021-02-20

## 2020-06-12 NOTE — Progress Notes (Signed)
Subjective:      Patient ID: Mary [name] is a 74 y.o. female.    Chief Complaint:  Chief Complaint   Patient presents with    Hypertension    Diabetes Follow-up    Hyperlipidemia       HPI:  74 YO Female  Presents for chronic care F/U    States takes medications as prescribed  BS at home was 113 this am. States usually runs around 110s  Occasional hypoglycemia  Uses humalog with meals if BS >150s  a1c 5.3 when checked by nephrology  States some swelling at times in ankles  Denies sores/ulcers on feet  Sees podiatry (Dr Richardson Landry), recent ultrasound showed atherosclerosis    Reports vomiting 3 days ago  Lasted 24 hours, then resolved  C/o frequent diarrhea after breakfast  Some intermittent difficulty swallowing, food gets stuck  Has not seen GI recently    C/o ongoing rash on buttock  Minimal improvement with steroid    Dialysis 3x/week  Makes some urine  Sees Dr Lutricia Feil for dialysis fistula    Notes worsening memory  Went to vascular surgeon's office today, thought her appointment with me was tomorrow  No difficulty taking her medications  No disorientation or difficulty recognizing people    Per previous records, has been diagnosed with CHF in the past  Pt unaware of this diagnosis, but does report that she was seeing cardiologist in [place] prior to moving here  Denies chest pain or SOB      Problem List:  Patient Active Problem List   Diagnosis    Cellulitis of left leg    Unspecified kidney failure    IDDM (insulin dependent diabetes mellitus)    Essential (primary) hypertension    Cellulitis    Chest pain at rest    Viral pericarditis    Exertional shortness of breath    Chest pain    Pleural effusion    Chronic renal failure, stage 5    Acute calculous cholecystitis    Acute pain of left knee    Bilateral low back pain with sciatica    Dependence on renal dialysis    Diabetic neuropathy    Dvt femoral (deep venous thrombosis)    Edema    Elevated LFTs    Hyperkalemia    Hyperlipidemia     Hypertensive renal disease    Neuropathy    Primary localized osteoarthritis    Spondylosis of lumbosacral region without myelopathy or radiculopathy    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 (Patient taking differently: Take 1,000 mg by mouth every 12 (twelve) hours as needed   ) 30 tablet 0    aspirin 81 MG tablet Take 81 mg by mouth daily.        atorvastatin (LIPITOR) 10 MG tablet TAKE 1 TABLET(10 MG) BY MOUTH EVERY MORNING 90 tablet 0    BD Pen Needle Nano 2nd Gen 32G X 4 MM  Misc USE WITH INSULIN PEN TID      calcium acetate (PHOSLO) 667 MG capsule TAKE 1 CAPSULE(667 MG) BY MOUTH THREE TIMES DAILY WITH MEALS 90 capsule 0    Colchicine 0.6 MG Cap TK 1 C PO BID PRN FOR GOUT      Febuxostat (ULORIC) 40 MG tablet Take 40 mg by mouth every morning         gabapentin (NEURONTIN) 300 MG capsule TAKE 2 CAPSULES(600 MG) BY MOUTH THREE TIMES DAILY 540 capsule 0    indomethacin (INDOCIN) 50 MG capsule Take 50 mg by mouth 2 (two) times daily with meals      insulin lispro (HumaLOG) 100 UNIT/ML injection Inject 5 Units into the skin 3 (three) times daily before meals      Levemir FlexTouch 100 UNIT/ML injection pen INJECT 10 UNITS INTO THE SKIN EVERY EVENING 9 mL 1    Magnesium Hydroxide (DULCOLAX PO) Take by mouth as needed      Multiple Vitamin (MULTIVITAMIN PO) Take 1 tablet by mouth daily         nystatin (MYCOSTATIN) ointment APPLY TWICE DAILY X 10 DAYS 30 g 0    SITagliptin (JANUVIA) 25 MG tablet Take 25 mg by mouth every  morning      triamcinolone (KENALOG) 0.1 % cream Apply twice daily 80 g 1    vitamin D (CHOLECALCIFEROL) 25 MCG (1000 UT) tablet Take 1,000 Units by mouth daily      insulin aspart (NovoLOG FlexPen) 100 UNIT/ML injection pen Inject 5 Units into the skin 3 (three) times daily before meals 2 pen 3     No current facility-administered medications for this visit.       Allergies:  Allergies   Allergen Reactions    Penicillins Hives     Onset date: 11-24-2011    Shrimp [Shellfish Allergy] Swelling     Also allergic to crab       Past Medical History:  Past Medical History:   Diagnosis Date    Abnormal vision     glasses    Arthritis     Cataracts, bilateral     Deep venous thrombosis of distal lower extremity ~2013    left leg    End-stage renal disease 2013    Hemodialysis MWF- [facility]    GERD (gastroesophageal reflux disease)     Hyperlipidemia     Hypertension     no meds    Type 2 diabetes mellitus, controlled     IDDm/ oral med. FBS ~ 119-120        Past Surgical History:  Past Surgical History:   Procedure Laterality Date    AV FISTULA PLACEMENT  2012    CHOLECYSTECTOMY      COLONOSCOPY  05/22/2018    Results: normal. Reported by Patient.    FISTULOGRAM Left 05/14/2020    Procedure: FISTULOGRAM WITH BALLOON ANGIOPLASTY;  Surgeon: [name], MD;  Location: [facility];  Service: Vascular;  Laterality: Left;    HYSTERECTOMY      INSERTION [place] Left 07/07/2019    REVISION, A-V FISTULA UPPER EXTREMITY Left 07/21/2019    Procedure: REVISION, A-V FISTULA UPPER EXTREMITY, WITH PATCH ANGIOPLASTY;  Surgeon: [name], MD;  Location: [facility];  Service: Vascular;  Laterality: Left;    REVISION, A-V FISTULA UPPER EXTREMITY Left 05/14/2020    Procedure: REVISION, A-V FISTULA UPPER EXTREMITY WITH PATCH ANGIOPLASTY;  Surgeon: [name], MD;  [name];  Service: Vascular;  Laterality: Left;    THROMBECTOMY Left 07/21/2019    Procedure:  THROMBECTOMY;  Surgeon: [name], MD;  Location: [facility];  Service: Vascular;  Laterality: Left;       Family History:  Family History   Problem Relation Age of Onset    Diabetes Mother     Cancer Father         throat       Social History:  Social History     Socioeconomic History    Marital status: Widowed     Spouse name: Not on file    Number of children: Not on file    Years of education: Not on file    Highest education level: Not on file   Occupational History    Not on file   Tobacco Use    Smoking status: Never Smoker    Smokeless tobacco: Never Used   Vaping Use    Vaping Use: Never used   Substance and Sexual Activity    Alcohol use: No    Drug use: Never    Sexual activity: Not on file   Other Topics Concern    Not on file   Social History Narrative    Not on file     Social Determinants of Health     Financial Resource Strain:     Difficulty of Paying Living Expenses:    Food Insecurity:     Worried About [place] in the Last Year:     Arboriculturist in the Last Year:    Transportation Needs:     Film/video editor (Medical):     Lack of Transportation (Non-Medical):    Physical Activity:     Days of Exercise per Week:     Minutes of Exercise per Session:    Stress:     Feeling of Stress :    Social Connections:     Frequency of Communication with Friends and Family:     Frequency of Social Gatherings with Friends and Family:     Attends Religious Services:     Active Member of Clubs or Organizations:     Attends Archivist Meetings:     Marital Status:    Intimate Partner Violence:     Fear of Current or Ex-Partner:     Emotionally Abused:     Physically Abused:     Sexually Abused:        The following sections were reviewed this encounter by the provider:        ROS:  Review of Systems   Constitutional: Positive for fatigue. Negative for appetite change and fever.   HENT: Positive for trouble swallowing. Negative for  voice change.    Respiratory: Negative for shortness of breath.    Cardiovascular: Negative for chest pain.   Gastrointestinal: Positive for diarrhea and vomiting. Negative for abdominal pain, constipation and nausea.   Genitourinary:        Makes small amount of urine   Musculoskeletal: Positive for arthralgias.   Psychiatric/Behavioral: Positive for confusion. Negative for dysphoric mood. The patient is not nervous/anxious.        Vitals:  BP 142/76    Pulse 72    Temp 97.2 F (36.2 C)    Ht 1.6 m (5\' 3" )    Wt 79.1 kg (174 lb 6.4 oz)    BMI 30.89 kg/m  Objective:     Physical Exam:  Physical Exam  Constitutional:       General: She is not in acute distress.     Appearance: She is not ill-appearing.   Eyes:      General: No scleral icterus.     Extraocular Movements: Extraocular movements intact.   Cardiovascular:      Rate and Rhythm: Normal rate and regular rhythm.      Heart sounds: No murmur heard.        Comments: No edema  Pulmonary:      Effort: Pulmonary effort is normal. No respiratory distress.      Breath sounds: Normal breath sounds. No stridor. No wheezing or rhonchi.   Abdominal:      General: There is no distension.      Palpations: There is no mass.      Tenderness: There is no abdominal tenderness.   Musculoskeletal:      Cervical back: Neck supple. No rigidity.   Skin:     Comments: Patch on L buttock now healed; no scaling or raised lesions; skin dry   Neurological:      Cranial Nerves: No cranial nerve deficit.   Psychiatric:         Mood and Affect: Mood normal.         Thought Content: Thought content normal.          Assessment:     1. Diabetic polyneuropathy associated with type 2 diabetes mellitus  - Lipid panel  - TSH  - Vitamin B12  - insulin aspart (NovoLOG FlexPen) 100 UNIT/ML injection pen; Inject 5 Units into the skin 3 (three) times daily before meals  Dispense: 2 pen; Refill: 3    2. Elevated LFTs  - Hepatic function panel (LFT)  - US Abdomen Limited Ruq; Future    3. Rash  -  triamcinolone (KENALOG) 0.1 % cream; Apply twice daily  Dispense: 80 g; Refill: 1  - Referral to Dermatology - EXTERNAL    4. Esophageal dysphagia  - Ambulatory referral to Gastroenterology    5. Macrocytic anemia  - Vitamin B12    6. Chronic congestive heart failure, unspecified heart failure type  - Referral to Cardiology - EXTERNAL    7. Chronic renal failure, stage 5    8. Dependence on renal dialysis      Plan:     Recommend limiting coffee in the mornings  Check labs  Will see Dr Lutricia Feil tomorrow  Reviewed labs from nephrology  Recommend GI evaluation  Recommend seeing cardiology to get updated testing  Recommend moisturizer, such as cetaphil multiple times/day  Will see derm if not improving  Discussed need to keep notes, calender  Recommend puzzles/games to help with memory  Reviewed worrisome signs/symptoms, seek care if present    Ferol Luz, MD

## 2020-06-13 ENCOUNTER — Ambulatory Visit (INDEPENDENT_AMBULATORY_CARE_PROVIDER_SITE_OTHER): Payer: Medicare Other | Admitting: Specialist

## 2020-06-13 ENCOUNTER — Encounter (INDEPENDENT_AMBULATORY_CARE_PROVIDER_SITE_OTHER): Payer: Self-pay | Admitting: Specialist

## 2020-06-13 ENCOUNTER — Encounter (INDEPENDENT_AMBULATORY_CARE_PROVIDER_SITE_OTHER): Payer: Medicare Other | Admitting: Specialist

## 2020-06-13 VITALS — BP 113/67 | HR 80 | Ht 63.0 in | Wt 174.6 lb

## 2020-06-13 DIAGNOSIS — N185 Chronic kidney disease, stage 5: Secondary | ICD-10-CM

## 2020-06-13 LAB — VITAMIN B12: Vitamin B-12: >2000 pg/mL — ABNORMAL HIGH (ref 211–911)

## 2020-06-13 LAB — TSH: TSH: 0.76 u[IU]/mL (ref 0.35–4.94)

## 2020-06-13 NOTE — Progress Notes (Signed)
[facility]    Chief Complaint   Patient presents with    Post-op     left arm fistulogram and revision 05/14/20         History of Present Illness     Mary [name] is a 73 y.o. female who presents status post percutaneous angioplasty of the cephalic arch.  Patient has no complaints and states the fistula is working very well.  Patient has undergone follow-up duplex ultrasound examination.    Past Medical History     Past Medical History:   Diagnosis Date    Abnormal vision     glasses    Arthritis     Cataracts, bilateral     Deep venous thrombosis of distal lower extremity ~2013    left leg    End-stage renal disease 2013    Hemodialysis MWF- [facility]    GERD (gastroesophageal reflux disease)     Hyperlipidemia     Hypertension     no meds    Type 2 diabetes mellitus, controlled     IDDm/ oral med. FBS ~ 119-120        Allergies     Allergies   Allergen Reactions    Penicillins Hives     Onset date: 11-24-2011    Shrimp [Shellfish Allergy] Swelling     Also allergic to crab       Medications     Current Outpatient Medications on File Prior to Visit   Medication Sig Dispense Refill    Accu-Chek FastClix Lancets Misc USE TO CHECK BLOOD SUGARS THREE TIMES DAILY 300 each 3    Accu-Chek Guide test strip U TID      acetaminophen (TYLENOL) 500 MG tablet Take 1 tablet (500 mg total) by mouth every 12 (twelve) hours as needed. (Patient taking differently: Take 1,000 mg by mouth every 12 (twelve) hours as needed   ) 30 tablet 0    aspirin 81 MG tablet Take 81 mg by mouth daily.        atorvastatin (LIPITOR) 10 MG tablet TAKE 1 TABLET(10 MG) BY MOUTH EVERY MORNING 90 tablet 0    BD Pen Needle Nano 2nd Gen 32G X 4 MM Misc USE WITH INSULIN PEN TID      calcium acetate (PHOSLO) 667 MG capsule TAKE 1 CAPSULE(667 MG) BY MOUTH THREE TIMES DAILY WITH MEALS 90 capsule 0    Colchicine 0.6 MG Cap TK 1 C PO BID PRN FOR GOUT      Febuxostat (ULORIC) 40 MG tablet Take 40 mg by mouth every morning          gabapentin (NEURONTIN) 300 MG capsule TAKE 2 CAPSULES(600 MG) BY MOUTH THREE TIMES DAILY 540 capsule 0    indomethacin (INDOCIN) 50 MG capsule Take 50 mg by mouth 2 (two) times daily with meals      insulin aspart (NovoLOG FlexPen) 100 UNIT/ML injection pen Inject 5 Units into the skin 3 (three) times daily before meals 2 pen 3    insulin lispro (HumaLOG) 100 UNIT/ML injection Inject 5 Units into the skin 3 (three) times daily before meals      Levemir FlexTouch 100 UNIT/ML injection pen INJECT 10 UNITS INTO THE SKIN EVERY EVENING 9 mL 1    Magnesium Hydroxide (DULCOLAX PO) Take by mouth as needed      Multiple Vitamin (MULTIVITAMIN PO) Take 1 tablet by mouth daily         nystatin (MYCOSTATIN) ointment APPLY TWICE DAILY  X 10 DAYS 30 g 0    SITagliptin (JANUVIA) 25 MG tablet Take 25 mg by mouth every morning      triamcinolone (KENALOG) 0.1 % cream Apply twice daily 80 g 1    vitamin D (CHOLECALCIFEROL) 25 MCG (1000 UT) tablet Take 1,000 Units by mouth daily       No current facility-administered medications on file prior to visit.       Review of Systems     Constitutional: Negative for fevers and chills  Skin: No rash or lesions  Respiratory: Negative for cough, wheezing, or hemoptysis  Cardiovascular: as per HPI  Gastrointestinal: Negative for abdominal pain, nausea, vomiting and diarrhea  Musculoskeletal:  No arthritic symptoms  Genitourinary: Negative for dysuria  All other systems were reviewed and are negative except what is stated in the HPI    Physical Exam     Vitals:    06/13/20 1159   BP: 113/67   Pulse: 80       Body mass index is 30.93 kg/m.    General:  Patient appears their stated age, well-nourished.  Alert and in no apparent distress.  Lungs: Respiratory effort unlabored, chest expansion symmetric.  Cardiac: RRR,  no JVD.   Extremities warm, Right Femoral pulses 2+, Left Femoral 2+, Right popliteal 2+, Left popliteal 2+, Right DP 2+, Left DP 2+, Right PT 2+, Left PT 2+,   Abd:  Soft, nondistended, nontender.    ZOX:WRUE ROM in all 4 extremities, symmetric excellent thrill left arm AV fistula  Skin: Color appropriate for race, Skin warm, dry, no gangrene, no non healing ulcers, no varicose veins , no hyperpigmentation, no lipo-dermatosclerosis  Neuro: Good insight and judgment, oriented to person, place, and time CN II-XII intact, gross motor and sensory intact       Labs     CBC:   WBC   Date/Time Value Ref Range Status   11/11/2013 05:13 PM 6.19 3.5 - 10.8 Final     RBC   Date/Time Value Ref Range Status   11/11/2013 05:13 PM 3.86 (L) 4.20 - 5.40 Final     Hgb   Date/Time Value Ref Range Status   11/11/2013 05:13 PM 11.5 (L) 12.0 - 16.0 g/dL Final     Hematocrit   Date/Time Value Ref Range Status   11/11/2013 05:13 PM 34.8 (L) 37.0 - 47.0 % Final     MCV   Date/Time Value Ref Range Status   11/11/2013 05:13 PM 90.2 80.0 - 100.0 fL Final     MCHC   Date/Time Value Ref Range Status   11/11/2013 05:13 PM 33.0 32 - 36 g/dL Final     RDW   Date/Time Value Ref Range Status   11/11/2013 05:13 PM 17 (H) 12.0 - 15.0 % Final     Platelets   Date/Time Value Ref Range Status   11/11/2013 05:13 PM 183 140 - 400 Final       CMP:   Sodium   Date/Time Value Ref Range Status   02/20/2014 04:37 PM 141 136 - 145 mEq/L Final     Potassium   Date/Time Value Ref Range Status   02/20/2014 04:37 PM 4.5 3.5 - 5.1 mEq/L Final     Chloride   Date/Time Value Ref Range Status   02/20/2014 04:37 PM 100 98 - 107 mEq/L Final     CO2   Date/Time Value Ref Range Status   02/20/2014 04:37 PM 23 22 - 29 mEq/L Final  Glucose   Date/Time Value Ref Range Status   02/20/2014 04:37 PM 194 (H) 70 - 100 mg/dL Final     Comment:     Specimen moderately lipemic. ER  Interpretive Data for Adult Female and Female Population  Indeterminate Range:  100-125 mg/dL  Equal to or greater than 126 mg/dL meets the ADA  guidelines for Diabetes Mellitus diagnosis if symptoms  are present and confirmed by repeat testing.  Random  (Non-Fasting)Interpretive Data (Adults):  Equal to or greater than 200 mg/dL meets the ADA  guidelines for Diabetes Mellitus diagnosis if symptoms  are present and confirmed by Fasting Glucose or GTT.     BUN   Date/Time Value Ref Range Status   02/20/2014 04:37 PM 75 (H) 7 - 19 mg/dL Final     Protein, Total   Date/Time Value Ref Range Status   06/12/2020 02:44 PM 7.8 6.0 - 8.3 g/dL Final     Alkaline Phosphatase   Date/Time Value Ref Range Status   06/12/2020 02:44 PM 126 (H) 37 - 106 U/L Final     AST (SGOT)   Date/Time Value Ref Range Status   06/12/2020 02:44 PM 44 (H) 5 - 34 U/L Final     ALT   Date/Time Value Ref Range Status   06/12/2020 02:44 PM 23 0 - 55 U/L Final     Anion Gap   Date/Time Value Ref Range Status   02/20/2014 04:37 PM 18.0 (H) 5 - 15 Final       Lipid Panel   Cholesterol   Date/Time Value Ref Range Status   06/12/2020 02:44 PM 134 0 - 199 mg/dL Final     Triglycerides   Date/Time Value Ref Range Status   06/12/2020 02:44 PM 151 (H) 34 - 149 mg/dL Final     HDL   Date/Time Value Ref Range Status   06/12/2020 02:44 PM 41 40 - 9,999 mg/dL Final     Comment:     An HDL cholesterol <40 mg/dL is low and constitutes a  coronary heart disease risk factor, and HDL-C>59 mg/dL is  a negative risk factor for CHD.  Ref: American Heart Association; Circulation 2004         Coags:   PT   Date/Time Value Ref Range Status   02/20/2014 04:37 PM 25.5 (H) 12.6 - 15.0 sec Final     PT INR   Date/Time Value Ref Range Status   02/20/2014 04:37 PM 2.4 (H) 0.9 - 1.1 Final     Comment:     Recommended Ranges for Protime INR:    2.0-3.0 for most medical and surgical thromboembolic states    1.3-0.8 for artificial heart valves  INR result may not represent exact Warfarin dosing level during  the transition period from Heparin to Warfarin therapy.  Recommend close clinical monitoring.     PTT   Date/Time Value Ref Range Status   11/11/2013 05:13 PM 36 23 - 37 Final       Assessment and Plan       1. Chronic renal  failure, stage 5  [place] [facility] Access Duplex Dopp       Mary [name] is a 74 y.o. female who presents status post percutaneous angioplasty of the cephalic arch.  Patient has no complaints and states the fistula is working very well.  Patient has undergone follow-up duplex ultrasound examination.  The duplex ultrasound reveals widely patent left arm AV fistula.  Therefore we will continue to monitor  her closely with a follow-up duplex ultrasound examination in 3 months.  This was explained to the patient and has agreed.    Dr. Mina Marble I would like to thank you for allowing me to participate in the care of this patient.    Best regards    Shary Key, MD, FACS, [facility]  Chief, Section of Vascular Surgery  [facility]      This note was generated by the Epic EMR system/ Dragon speech recognition and may contain inherent errors or omissions not intended by the user. Grammatical errors, random word insertions, deletions, pronoun errors and incomplete sentences are occasional consequences of this technology due to software limitations. Not all errors are caught or corrected. If there are questions or concerns about the content of this note or information contained within the body of this dictation they should be addressed directly with the author for clarification

## 2020-07-04 ENCOUNTER — Encounter (INDEPENDENT_AMBULATORY_CARE_PROVIDER_SITE_OTHER): Payer: Self-pay | Admitting: Specialist

## 2020-07-04 ENCOUNTER — Ambulatory Visit (INDEPENDENT_AMBULATORY_CARE_PROVIDER_SITE_OTHER): Payer: Medicare Other | Admitting: Specialist

## 2020-07-04 ENCOUNTER — Encounter (INDEPENDENT_AMBULATORY_CARE_PROVIDER_SITE_OTHER): Payer: Self-pay

## 2020-07-04 VITALS — BP 118/61 | HR 71 | Temp 97.4°F | Ht 63.0 in | Wt 170.6 lb

## 2020-07-04 DIAGNOSIS — I70244 Atherosclerosis of native arteries of left leg with ulceration of heel and midfoot: Secondary | ICD-10-CM

## 2020-07-04 DIAGNOSIS — N185 Chronic kidney disease, stage 5: Secondary | ICD-10-CM

## 2020-07-04 DIAGNOSIS — Z09 Encounter for follow-up examination after completed treatment for conditions other than malignant neoplasm: Secondary | ICD-10-CM

## 2020-07-04 NOTE — Progress Notes (Signed)
Appt Date/Time: 07/04/20 12:40 pm    74 y.o. female   PCP: Ferol Luz, MD  Referring Physician:    New Patient Follow-Up Post-op   [place] [facility] [place] [facility] Other [facility]     Chief Complaint/HPI: Lower extremity, circulation and foot ulcer  Date of Last Visit: 06/13/20    Allergies:   Allergies   Allergen Reactions    Penicillins Hives     Onset date: 11-24-2011    Shrimp [Shellfish Allergy] Swelling     Also allergic to crab          Selected Medications:    Coumadin Xarelto Eliquis Pradaxa Lovenox Other thinner:    Plavix Aspirin Brillinta Effient  Pletal Trental Fish Oil   Insulin Metformin Other Diabetes  Januvia Atorvastatin Rosuvastatin Other Chol:    MHx:  Stroke / TIA / Seizures HTN / HLD  Diabetes   MI / CAD A-fib / Arrhythmia  COPD / Asthma / (other lung)   Kidney Disease Liver Disease DVT / Coagulopathy   Wounds Spine / other MSK  Cancer            Smoker           Former Smoker          Never Smoker    Vital Signs this Visit Pulses  HT  BP R BP L Temp   Rad Ulnar Brach Fem Pop DP PT        R          WT  Pulse R Pulse L SpO2       L          Imaging results:        Plan of Care:

## 2020-07-04 NOTE — Progress Notes (Signed)
[facility]    Chief Complaint   Patient presents with    Follow-up     c/o BLE circulation issues          History of Present Illness     Mary [name] is a 74 y.o. female who presents with complaints of some bilateral leg pain and very small ulcers in the right foot.  Patient has been referred for arterial ultrasound examination of lower extremity arterial system.  She states that her fistula has been functioning reasonably well.    Past Medical History     Past Medical History:   Diagnosis Date    Abnormal vision     glasses    Arthritis     Cataracts, bilateral     Deep venous thrombosis of distal lower extremity ~2013    left leg    End-stage renal disease 2013    Hemodialysis MWF- [facility]    GERD (gastroesophageal reflux disease)     Hyperlipidemia     Hypertension     no meds    Type 2 diabetes mellitus, controlled     IDDm/ oral med. FBS ~ 119-120        Allergies     Allergies   Allergen Reactions    Penicillins Hives     Onset date: 11-24-2011    Shrimp [Shellfish Allergy] Swelling     Also allergic to crab       Medications     Current Outpatient Medications on File Prior to Visit   Medication Sig Dispense Refill    Accu-Chek FastClix Lancets Misc USE TO CHECK BLOOD SUGARS THREE TIMES DAILY 300 each 3    Accu-Chek Guide test strip U TID      acetaminophen (TYLENOL) 500 MG tablet Take 1 tablet (500 mg total) by mouth every 12 (twelve) hours as needed. (Patient taking differently: Take 1,000 mg by mouth every 12 (twelve) hours as needed   ) 30 tablet 0    aspirin 81 MG tablet Take 81 mg by mouth daily.        atorvastatin (LIPITOR) 10 MG tablet TAKE 1 TABLET(10 MG) BY MOUTH EVERY MORNING (Patient taking differently: 20 mg   ) 90 tablet 0    BD Pen Needle Nano 2nd Gen 32G X 4 MM Misc USE WITH INSULIN PEN TID      calcium acetate (PHOSLO) 667 MG capsule TAKE 1 CAPSULE(667 MG) BY MOUTH THREE TIMES DAILY WITH MEALS 90 capsule 0    Colchicine 0.6 MG Cap TK 1 C PO BID PRN  FOR GOUT      Febuxostat (ULORIC) 40 MG tablet Take 40 mg by mouth every morning         gabapentin (NEURONTIN) 300 MG capsule TAKE 2 CAPSULES(600 MG) BY MOUTH THREE TIMES DAILY 540 capsule 0    indomethacin (INDOCIN) 50 MG capsule Take 50 mg by mouth 2 (two) times daily with meals      insulin aspart (NovoLOG FlexPen) 100 UNIT/ML injection pen Inject 5 Units into the skin 3 (three) times daily before meals 2 pen 3    insulin lispro (HumaLOG) 100 UNIT/ML injection Inject 5 Units into the skin 3 (three) times daily before meals      Levemir FlexTouch 100 UNIT/ML injection pen INJECT 10 UNITS INTO THE SKIN EVERY EVENING 9 mL 1    Magnesium Hydroxide (DULCOLAX PO) Take by mouth as needed      Multiple Vitamin (MULTIVITAMIN PO) Take 1  tablet by mouth daily         nystatin (MYCOSTATIN) ointment APPLY TWICE DAILY X 10 DAYS 30 g 0    SITagliptin (JANUVIA) 25 MG tablet Take 25 mg by mouth every morning      triamcinolone (KENALOG) 0.1 % cream Apply twice daily 80 g 1    vitamin D (CHOLECALCIFEROL) 25 MCG (1000 UT) tablet Take 1,000 Units by mouth daily       No current facility-administered medications on file prior to visit.       Review of Systems     Constitutional: Negative for fevers and chills  Skin: No rash or lesions  Respiratory: Negative for cough, wheezing, or hemoptysis  Cardiovascular: as per HPI  Gastrointestinal: Negative for abdominal pain, nausea, vomiting and diarrhea  Musculoskeletal:  No arthritic symptoms  Genitourinary: Negative for dysuria  All other systems were reviewed and are negative except what is stated in the HPI    Physical Exam     Vitals:    07/04/20 1251   BP: 118/61   Pulse: 71   Temp: 97.4 F (36.3 C)   SpO2: 97%       Body mass index is 30.22 kg/m.    General:  Patient appears their stated age, well-nourished.  Alert and in no apparent distress.  Lungs: Respiratory effort unlabored, chest expansion symmetric.  Cardiac: RRR,  no JVD.   Extremities warm, Right Femoral  pulses 2+, Left Femoral 2+,  Abd: Soft, nondistended, nontender.    OAC:ZYSA ROM in all 4 extremities, symmetric   Skin: Color appropriate for race, Skin warm, dry, no gangrene, no non healing ulcers, no varicose veins , no hyperpigmentation, no lipo-dermatosclerosis  Neuro: Good insight and judgment, oriented to person, place, and time CN II-XII intact, gross motor and sensory intact       Labs     CBC:   WBC   Date/Time Value Ref Range Status   11/11/2013 05:13 PM 6.19 3.5 - 10.8 Final     RBC   Date/Time Value Ref Range Status   11/11/2013 05:13 PM 3.86 (L) 4.20 - 5.40 Final     Hgb   Date/Time Value Ref Range Status   11/11/2013 05:13 PM 11.5 (L) 12.0 - 16.0 g/dL Final     Hematocrit   Date/Time Value Ref Range Status   11/11/2013 05:13 PM 34.8 (L) 37.0 - 47.0 % Final     MCV   Date/Time Value Ref Range Status   11/11/2013 05:13 PM 90.2 80.0 - 100.0 fL Final     MCHC   Date/Time Value Ref Range Status   11/11/2013 05:13 PM 33.0 32 - 36 g/dL Final     RDW   Date/Time Value Ref Range Status   11/11/2013 05:13 PM 17 (H) 12.0 - 15.0 % Final     Platelets   Date/Time Value Ref Range Status   11/11/2013 05:13 PM 183 140 - 400 Final       CMP:   Sodium   Date/Time Value Ref Range Status   02/20/2014 04:37 PM 141 136 - 145 mEq/L Final     Potassium   Date/Time Value Ref Range Status   02/20/2014 04:37 PM 4.5 3.5 - 5.1 mEq/L Final     Chloride   Date/Time Value Ref Range Status   02/20/2014 04:37 PM 100 98 - 107 mEq/L Final     CO2   Date/Time Value Ref Range Status   02/20/2014 04:37 PM 23 22 -  29 mEq/L Final     Glucose   Date/Time Value Ref Range Status   02/20/2014 04:37 PM 194 (H) 70 - 100 mg/dL Final     Comment:     Specimen moderately lipemic. ER  Interpretive Data for Adult Female and Female Population  Indeterminate Range:  100-125 mg/dL  Equal to or greater than 126 mg/dL meets the ADA  guidelines for Diabetes Mellitus diagnosis if symptoms  are present and confirmed by repeat testing.  Random  (Non-Fasting)Interpretive Data (Adults):  Equal to or greater than 200 mg/dL meets the ADA  guidelines for Diabetes Mellitus diagnosis if symptoms  are present and confirmed by Fasting Glucose or GTT.     BUN   Date/Time Value Ref Range Status   02/20/2014 04:37 PM 75 (H) 7 - 19 mg/dL Final     Protein, Total   Date/Time Value Ref Range Status   06/12/2020 02:44 PM 7.8 6.0 - 8.3 g/dL Final     Alkaline Phosphatase   Date/Time Value Ref Range Status   06/12/2020 02:44 PM 126 (H) 37 - 106 U/L Final     AST (SGOT)   Date/Time Value Ref Range Status   06/12/2020 02:44 PM 44 (H) 5 - 34 U/L Final     ALT   Date/Time Value Ref Range Status   06/12/2020 02:44 PM 23 0 - 55 U/L Final     Anion Gap   Date/Time Value Ref Range Status   02/20/2014 04:37 PM 18.0 (H) 5 - 15 Final       Lipid Panel   Cholesterol   Date/Time Value Ref Range Status   06/12/2020 02:44 PM 134 0 - 199 mg/dL Final     Triglycerides   Date/Time Value Ref Range Status   06/12/2020 02:44 PM 151 (H) 34 - 149 mg/dL Final     HDL   Date/Time Value Ref Range Status   06/12/2020 02:44 PM 41 40 - 9,999 mg/dL Final     Comment:     An HDL cholesterol <40 mg/dL is low and constitutes a  coronary heart disease risk factor, and HDL-C>59 mg/dL is  a negative risk factor for CHD.  Ref: American Heart Association; Circulation 2004         Coags:   PT   Date/Time Value Ref Range Status   02/20/2014 04:37 PM 25.5 (H) 12.6 - 15.0 sec Final     PT INR   Date/Time Value Ref Range Status   02/20/2014 04:37 PM 2.4 (H) 0.9 - 1.1 Final     Comment:     Recommended Ranges for Protime INR:    2.0-3.0 for most medical and surgical thromboembolic states    2.7-2.5 for artificial heart valves  INR result may not represent exact Warfarin dosing level during  the transition period from Heparin to Warfarin therapy.  Recommend close clinical monitoring.     PTT   Date/Time Value Ref Range Status   11/11/2013 05:13 PM 36 23 - 37 Final       Assessment and Plan       1. 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 Chronic renal failure, stage 5  US Arterial/ Graft Duplex Dopp Low Extrem Bil Comp    Ankle brachial index    [place] Hemodialysis Access  Duplex Dopp       Mary [name] is a 74 y.o. female who presents with complaints of some bilateral leg pain and very small ulcers in the right foot.  Patient has been referred for arterial ultrasound examination of lower extremity arterial system.  She states that her fistula has been functioning reasonably well.  Therefore I will obtain a duplex ultrasound examination of both lower extremity arterial system as well as left upper arm AV fistula and I will be seeing her back in the office after the above studies have been completed.  It was explained to the patient and has agreed.    Shary Key, MD, [place], [facility]  Chief, Section of Vascular [facility]

## 2020-07-07 ENCOUNTER — Other Ambulatory Visit (INDEPENDENT_AMBULATORY_CARE_PROVIDER_SITE_OTHER): Payer: Self-pay | Admitting: Student in an Organized Health Care Education/Training Program

## 2020-07-07 DIAGNOSIS — E1121 Type 2 diabetes mellitus with diabetic nephropathy: Secondary | ICD-10-CM

## 2020-07-13 ENCOUNTER — Other Ambulatory Visit (INDEPENDENT_AMBULATORY_CARE_PROVIDER_SITE_OTHER): Payer: Self-pay | Admitting: Student in an Organized Health Care Education/Training Program

## 2020-07-13 DIAGNOSIS — E1142 Type 2 diabetes mellitus with diabetic polyneuropathy: Secondary | ICD-10-CM

## 2020-07-16 ENCOUNTER — Other Ambulatory Visit (INDEPENDENT_AMBULATORY_CARE_PROVIDER_SITE_OTHER): Payer: Medicare Other

## 2020-07-16 ENCOUNTER — Ambulatory Visit (INDEPENDENT_AMBULATORY_CARE_PROVIDER_SITE_OTHER): Payer: Medicare Other

## 2020-07-16 DIAGNOSIS — N185 Chronic kidney disease, stage 5: Secondary | ICD-10-CM

## 2020-07-16 DIAGNOSIS — I70244 Atherosclerosis of native arteries of left leg with ulceration of heel and midfoot: Secondary | ICD-10-CM

## 2020-07-16 DIAGNOSIS — I70234 Atherosclerosis of native arteries of right leg with ulceration of heel and midfoot: Secondary | ICD-10-CM

## 2020-07-17 ENCOUNTER — Encounter (INDEPENDENT_AMBULATORY_CARE_PROVIDER_SITE_OTHER): Payer: Self-pay

## 2020-07-17 NOTE — Progress Notes (Signed)
Appt Date/Time:  07/18/20  @  2:00 pm    74 y.o. female   PCP: Ferol Luz, MD  Referring Physician:    New Patient Follow-Up  X Post-op   [place] [facility] [place] [facility]  07/16/20 hemo, ABI, art/graft Other Imaging     Chief Complaint/HPI: 2 week F/u Left AV Fistula.  Bilateral leg pain w/ulcers in right foot.    Date of Last Visit: 07/04/20  2 weeks (around 07/18/2020).   Ultrasound examination of both lower extremity arterial system as well as left upper arm AV fistula.      Allergies:   Allergies   Allergen Reactions    Penicillins Hives     Onset date: 11-24-2011    Shrimp [Shellfish Allergy] Swelling     Also allergic to crab          Selected Medications:    Coumadin Xarelto Eliquis Pradaxa Lovenox Other thinner:    Plavix Aspirin Brillinta Effient  Pletal Trental Fish Oil   Insulin Metformin Other Diabetes Atorvastatin Rosuvastatin Other Chol:    MHx:  Stroke / TIA / Seizures HTN / HLD  Diabetes   MI / CAD A-fib / Arrhythmia  COPD / Asthma / (other lung)   Kidney Disease Liver Disease DVT / Coagulopathy   Wounds Spine / other MSK  Cancer            Smoker           Former Smoker    X      Never Smoker    Vital Signs this Visit Pulses  HT  BP R BP L Temp   Rad Ulnar Brach Fem Pop DP PT        R          WT  Pulse R Pulse L SpO2       L          Imaging results:        Plan of Care:

## 2020-07-18 ENCOUNTER — Encounter (INDEPENDENT_AMBULATORY_CARE_PROVIDER_SITE_OTHER): Payer: Self-pay | Admitting: Specialist

## 2020-07-18 ENCOUNTER — Ambulatory Visit (INDEPENDENT_AMBULATORY_CARE_PROVIDER_SITE_OTHER): Payer: Medicare Other | Admitting: Specialist

## 2020-07-18 VITALS — BP 95/43 | HR 74 | Temp 97.0°F | Wt 169.8 lb

## 2020-07-18 DIAGNOSIS — Z09 Encounter for follow-up examination after completed treatment for conditions other than malignant neoplasm: Secondary | ICD-10-CM

## 2020-07-18 DIAGNOSIS — I739 Peripheral vascular disease, unspecified: Secondary | ICD-10-CM

## 2020-07-18 DIAGNOSIS — N185 Chronic kidney disease, stage 5: Secondary | ICD-10-CM

## 2020-07-18 NOTE — Progress Notes (Signed)
[facility]    Chief Complaint   Patient presents with    Follow-up     2 week F/u Left AV Fistula.  Bilateral leg pain w/ulcers in right foot         History of Present Illness     Mary [name] is a 74 y.o. female who presents with history of chronic renal failure as well as peripheral vascular disease.  Patient states that the ulcers in the lower extremities have healed.  Patient denies having any significant complaints with the upper extremity AV fistula.  Has undergone duplex ultrasound examination.    Past Medical History     Past Medical History:   Diagnosis Date    Abnormal vision     glasses    Arthritis     Cataracts, bilateral     Deep venous thrombosis of distal lower extremity ~2013    left leg    End-stage renal disease 2013    H[facility]od[facility] MWF- [name]    GERD (gastroesophageal reflux disease)     Hyperlipidemia     Hypertension     no meds    Type 2 diabetes mellitus, controlled     IDDm/ oral med. FBS ~ 119-120        Allergies     Allergies   Allergen Reactions    Penicillins Hives     Onset date: 11-24-2011    Shrimp [Shellfish Allergy] Swelling     Also allergic to crab       Medications     Current Outpatient Medications on File Prior to Visit   Medication Sig Dispense Refill    Accu-Chek FastClix Lancets Misc USE TO CHECK BLOOD SUGARS THREE TIMES DAILY 300 each 3    Accu-Chek Guide test strip U TID      acetaminophen (TYLENOL) 500 MG tablet Take 1 tablet (500 mg total) by mouth every 12 (twelve) hours as needed. (Patient taking differently: Take 1,000 mg by mouth every 12 (twelve) hours as needed   ) 30 tablet 0    aspirin 81 MG tablet Take 81 mg by mouth daily.        atorvastatin (LIPITOR) 20 MG tablet Take 1 tablet (20 mg total) by mouth daily 90 tablet 1    BD Pen Needle Nano 2nd Gen 32G X 4 MM Misc USE WITH INSULIN PEN TID      calcium acetate (PHOSLO) 667 MG capsule TAKE 1 CAPSULE(667 MG) BY MOUTH THREE TIMES DAILY WITH MEALS 90 capsule 0     Colchicine 0.6 MG Cap TK 1 C PO BID PRN FOR GOUT      Febuxostat (ULORIC) 40 MG tablet Take 40 mg by mouth every morning         gabapentin (NEURONTIN) 300 MG capsule TAKE 2 CAPSULES(600 MG) BY MOUTH THREE TIMES DAILY 540 capsule 0    indomethacin (INDOCIN) 50 MG capsule Take 50 mg by mouth 2 (two) times daily with meals      insulin aspart (NovoLOG FlexPen) 100 UNIT/ML injection pen Inject 5 Units into the skin 3 (three) times daily before meals 2 pen 3    insulin lispro (HumaLOG) 100 UNIT/ML injection Inject 5 Units into the skin 3 (three) times daily before meals      Levemir FlexTouch 100 UNIT/ML injection pen INJECT 10 UNITS INTO THE SKIN EVERY EVENING 9 mL 1    Magnesium Hydroxide (DULCOLAX PO) Take by mouth as needed      Multiple  Vitamin (MULTIVITAMIN PO) Take 1 tablet by mouth daily         nystatin (MYCOSTATIN) ointment APPLY TWICE DAILY X 10 DAYS 30 g 0    SITagliptin (JANUVIA) 25 MG tablet Take 25 mg by mouth every morning      triamcinolone (KENALOG) 0.1 % cream Apply twice daily 80 g 1    vitamin D (CHOLECALCIFEROL) 25 MCG (1000 UT) tablet Take 1,000 Units by mouth daily       No current facility-administered medications on file prior to visit.       Review of Systems     Constitutional: Negative for fevers and chills  Skin: No rash or lesions  Respiratory: Negative for cough, wheezing, or hemoptysis  Cardiovascular: as per HPI  Gastrointestinal: Negative for abdominal pain, nausea, vomiting and diarrhea  Musculoskeletal:  No arthritic symptoms  Genitourinary: Negative for dysuria  All other systems were reviewed and are negative except what is stated in the HPI    Physical Exam     Vitals:    07/18/20 1253   BP: 95/43   Pulse: 74   Temp: 97 F (36.1 C)   SpO2: 97%       Body mass index is 30.07 kg/m.    General:  Patient appears their stated age, well-nourished.  Alert and in no apparent distress.  Lungs: Respiratory effort unlabored, chest expansion symmetric.  Cardiac: RRR,  no JVD.    Extremities warm, Right Femoral pulses 2+, Left Femoral 2+,   Abd: Soft, nondistended, nontender.    UEK:CMKL ROM in all 4 extremities, symmetric pulsatile flow left upper arm cephalic vein transposition  Skin: Color appropriate for race, Skin warm, dry, no gangrene, no non healing ulcers, no varicose veins , no hyperpigmentation, no lipo-dermatosclerosis  Neuro: Good insight and judgment, oriented to person, place, and time CN II-XII intact, gross motor and sensory intact       Labs     CBC:   WBC   Date/Time Value Ref Range Status   11/11/2013 05:13 PM 6.19 3.5 - 10.8 Final     RBC   Date/Time Value Ref Range Status   11/11/2013 05:13 PM 3.86 (L) 4.20 - 5.40 Final     Hgb   Date/Time Value Ref Range Status   11/11/2013 05:13 PM 11.5 (L) 12.0 - 16.0 g/dL Final     Hematocrit   Date/Time Value Ref Range Status   11/11/2013 05:13 PM 34.8 (L) 37.0 - 47.0 % Final     MCV   Date/Time Value Ref Range Status   11/11/2013 05:13 PM 90.2 80.0 - 100.0 fL Final     MCHC   Date/Time Value Ref Range Status   11/11/2013 05:13 PM 33.0 32 - 36 g/dL Final     RDW   Date/Time Value Ref Range Status   11/11/2013 05:13 PM 17 (H) 12.0 - 15.0 % Final     Platelets   Date/Time Value Ref Range Status   11/11/2013 05:13 PM 183 140 - 400 Final       CMP:   Sodium   Date/Time Value Ref Range Status   02/20/2014 04:37 PM 141 136 - 145 mEq/L Final     Potassium   Date/Time Value Ref Range Status   02/20/2014 04:37 PM 4.5 3.5 - 5.1 mEq/L Final     Chloride   Date/Time Value Ref Range Status   02/20/2014 04:37 PM 100 98 - 107 mEq/L Final     CO2  Date/Time Value Ref Range Status   02/20/2014 04:37 PM 23 22 - 29 mEq/L Final     Glucose   Date/Time Value Ref Range Status   02/20/2014 04:37 PM 194 (H) 70 - 100 mg/dL Final     Comment:     Specimen moderately lipemic. ER  Interpretive Data for Adult Female and Female Population  Indeterminate Range:  100-125 mg/dL  Equal to or greater than 126 mg/dL meets the ADA  guidelines for Diabetes Mellitus  diagnosis if symptoms  are present and confirmed by repeat testing.  Random (Non-Fasting)Interpretive Data (Adults):  Equal to or greater than 200 mg/dL meets the ADA  guidelines for Diabetes Mellitus diagnosis if symptoms  are present and confirmed by Fasting Glucose or GTT.     BUN   Date/Time Value Ref Range Status   02/20/2014 04:37 PM 75 (H) 7 - 19 mg/dL Final     Protein, Total   Date/Time Value Ref Range Status   06/12/2020 02:44 PM 7.8 6.0 - 8.3 g/dL Final     Alkaline Phosphatase   Date/Time Value Ref Range Status   06/12/2020 02:44 PM 126 (H) 37 - 106 U/L Final     AST (SGOT)   Date/Time Value Ref Range Status   06/12/2020 02:44 PM 44 (H) 5 - 34 U/L Final     ALT   Date/Time Value Ref Range Status   06/12/2020 02:44 PM 23 0 - 55 U/L Final     Anion Gap   Date/Time Value Ref Range Status   02/20/2014 04:37 PM 18.0 (H) 5 - 15 Final       Lipid Panel   Cholesterol   Date/Time Value Ref Range Status   06/12/2020 02:44 PM 134 0 - 199 mg/dL Final     Triglycerides   Date/Time Value Ref Range Status   06/12/2020 02:44 PM 151 (H) 34 - 149 mg/dL Final     HDL   Date/Time Value Ref Range Status   06/12/2020 02:44 PM 41 40 - 9,999 mg/dL Final     Comment:     An HDL cholesterol <40 mg/dL is low and constitutes a  coronary heart disease risk factor, and HDL-C>59 mg/dL is  a negative risk factor for CHD.  Ref: American Heart Association; Circulation 2004         Coags:   PT   Date/Time Value Ref Range Status   02/20/2014 04:37 PM 25.5 (H) 12.6 - 15.0 sec Final     PT INR   Date/Time Value Ref Range Status   02/20/2014 04:37 PM 2.4 (H) 0.9 - 1.1 Final     Comment:     Recommended Ranges for Protime INR:    2.0-3.0 for most medical and surgical thromboembolic states    3.8-9.3 for artificial heart valves  INR result may not represent exact Warfarin dosing level during  the transition period from Heparin to Warfarin therapy.  Recommend close clinical monitoring.     PTT   Date/Time Value Ref Range Status   11/11/2013  05:13 PM 36 23 - 37 Final       Assessment and Plan       1. Chronic renal failure, stage 5  [place] Hemodialysis Access Duplex Dopp   2. PVD (peripheral vascular disease)  [place] Hemodialysis Access Duplex Dopp       Mary [name] is a 74 y.o. female who presents with history of chronic renal failure as well as peripheral vascular disease.  Patient  states that the ulcers in the lower extremities have healed.  Patient denies having any significant complaints with the upper extremity AV fistula.  Has undergone duplex ultrasound examination.  The duplex ultrasound reveals patent left upper arm cephalic vein transposition with some area of stenosis in the proximal aspect of the cephalic arch.  She states that there is no excess bleeding and all her numbers are good during dialysis and the machine does not alarms therefore at the present time we will continue to monitor her conservatively and I will be seeing her back for follow-up duplex ultrasound examination in 3 months.  I have instructed her to return if she develops excess bleeding or malfunctioning of the fistula.  This was explained to the patient and has agreed.    Dr. Mina Marble I would like to thank you for allowing me to participate in the care of this patient.    Best regards    Shary Key, MD, FACS, [facility]  Chief, Section of Vascular Surgery  [facility]      This note was generated by the Epic EMR system/ Dragon speech recognition and may contain inherent errors or omissions not intended by the user. Grammatical errors, random word insertions, deletions, pronoun errors and incomplete sentences are occasional consequences of this technology due to software limitations. Not all errors are caught or corrected. If there are questions or concerns about the content of this note or information contained within the body of this dictation they should be addressed directly with the author for clarification

## 2020-07-30 ENCOUNTER — Telehealth (INDEPENDENT_AMBULATORY_CARE_PROVIDER_SITE_OTHER): Payer: Self-pay

## 2020-07-30 ENCOUNTER — Telehealth (INDEPENDENT_AMBULATORY_CARE_PROVIDER_SITE_OTHER): Payer: Medicare Other | Admitting: Student in an Organized Health Care Education/Training Program

## 2020-07-30 ENCOUNTER — Encounter (INDEPENDENT_AMBULATORY_CARE_PROVIDER_SITE_OTHER): Payer: Self-pay | Admitting: Student in an Organized Health Care Education/Training Program

## 2020-07-30 VITALS — Ht 63.0 in

## 2020-07-30 DIAGNOSIS — R197 Diarrhea, unspecified: Secondary | ICD-10-CM

## 2020-07-30 DIAGNOSIS — R634 Abnormal weight loss: Secondary | ICD-10-CM

## 2020-07-30 DIAGNOSIS — I129 Hypertensive chronic kidney disease with stage 1 through stage 4 chronic kidney disease, or unspecified chronic kidney disease: Secondary | ICD-10-CM

## 2020-07-30 DIAGNOSIS — N185 Chronic kidney disease, stage 5: Secondary | ICD-10-CM

## 2020-07-30 DIAGNOSIS — E1142 Type 2 diabetes mellitus with diabetic polyneuropathy: Secondary | ICD-10-CM

## 2020-07-30 DIAGNOSIS — Z992 Dependence on renal dialysis: Secondary | ICD-10-CM

## 2020-07-30 MED ORDER — NYSTATIN 100000 UNIT/GM EX CREA
TOPICAL_CREAM | Freq: Two times a day (BID) | CUTANEOUS | 2 refills | Status: DC
Start: 2020-07-30 — End: 2021-01-14

## 2020-07-30 NOTE — Progress Notes (Signed)
[place]                       Date of Virtual Visit: 07/30/2020 1:32 PM      Verbal consent has been obtained from the patient to conduct this video visit encounter to minimize exposure to COVID-19. Patient was positively identified and was verified to be located in the state of [place] at the time of the visit       Patient ID: Mary [name] is a 74 y.o. female.  Attending Physician: Ferol Luz, MD       Telemedicine Eligibility:    State Location:  [x]  [place]  []  [place]  []  [place] []  [place]  []  Other:    Physical Location:  [x]  Home  []         []        []          []  Other:    Patient Identity Verification:  [x]  State Issued ID  []  Insurance Eligibility Check  []  Other:    Physical Address Verification: (for 911)  [x]  Yes  []  No    Personal identity shared with patient:  [x]  Yes  []  No    Education on nature of video visit shared with patient:  [x]  Yes  []  No    Emergency plan agreed upon with patient:  [x]  Yes  []  No    If the patient had not had this virtual visit, what would they have done?  []         []         []        []          []  Other:    Visit terminated since not appropriate for virtual care:  [x]  N/A  []  Reason:         Chief Complaint:    Chief Complaint   Patient presents with    Diarrhea               HPI:    74 YO Female  Consents to VV and billing     Presents with diarrhea after eating  States BP was low this am and needed to take her medicine - was taken at dialysis this am  C/o having diarrhea immediately after eating  States occurs with every meal X 1 week  Tried peptobismal without relief  Denies bleeding with BM  States rectal area is sore  Denies fever, nausea, or vomiting    Cardiologist increased atorvastatin to 20mg  from 10mg  last month  Symptoms started prior to med change  No other med changes    BP 119 this morning  Have been 110s-120s per pt  Neuropathy has been stable    Diarrhea   This is a new problem. The  current episode started 1 to 4 weeks ago. The problem has been gradually worsening. Diarrhea characteristics: No blood in stool. The patient states that diarrhea does not awaken her from sleep. Associated symptoms include bloating and weight loss (unsure how much). Pertinent negatives include no abdominal pain, chills, coughing, fever or vomiting. Risk factors: dialysis 3 days/week. She has tried bismuth subsalicylate for the symptoms. The treatment provided mild relief.     *Permission obtained to bill insurance for today's VV*          Problem List:    Patient Active Problem List   Diagnosis    Cellulitis of left leg  Unspecified kidney failure    IDDM (insulin dependent diabetes mellitus)    Essential (primary) hypertension    Cellulitis    Chest pain at rest    Viral pericarditis    Exertional shortness of breath    Chest pain    Pleural effusion    Chronic renal failure, stage 5    Acute calculous cholecystitis    Acute pain of left knee    Bilateral low back pain with sciatica    Dependence on renal dialysis    Diabetic neuropathy    Dvt femoral (deep venous thrombosis)    Edema    Elevated LFTs    Hyperkalemia    Hyperlipidemia    Hypertensive renal disease    Neuropathy    Primary localized osteoarthritis    Spondylosis of lumbosacral region without myelopathy or radiculopathy    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 (Patient taking differently: Take 1,000 mg by mouth every 12 (twelve) hours as needed   ) 30 tablet 0    aspirin 81 MG tablet Take 81 mg by mouth daily.        atorvastatin (LIPITOR) 20 MG tablet Take 1 tablet (20 mg total) by mouth daily 90 tablet 1    BD Pen Needle Nano 2nd Gen 32G X 4 MM Misc USE WITH INSULIN PEN TID      calcium acetate (PHOSLO) 667 MG capsule TAKE 1 CAPSULE(667 MG) BY MOUTH THREE TIMES DAILY WITH MEALS 90 capsule 0    Colchicine 0.6 MG Cap TK 1 C PO BID PRN FOR GOUT      Febuxostat (ULORIC) 40 MG tablet Take 40 mg by mouth every morning         gabapentin (NEURONTIN) 300 MG capsule TAKE 2 CAPSULES(600 MG) BY MOUTH THREE TIMES DAILY 540 capsule 0    indomethacin (INDOCIN) 50 MG capsule Take 50 mg by mouth 2 (two) times daily with meals      insulin aspart (NovoLOG FlexPen) 100 UNIT/ML injection pen Inject 5 Units into the skin 3 (three) times daily before meals 2 pen 3    insulin lispro (HumaLOG) 100 UNIT/ML injection Inject 5 Units into the skin 3 (three) times daily before meals      Levemir FlexTouch 100 UNIT/ML injection pen INJECT 10 UNITS INTO THE SKIN EVERY EVENING 9 mL 1    Magnesium Hydroxide (DULCOLAX PO) Take by mouth as needed      midodrine (PROAMATINE) 10 MG tablet Take 10 mg by mouth as needed for low blood pressure Takes med prior to dialysis      Multiple Vitamin (MULTIVITAMIN PO) Take 1 tablet by mouth daily         nystatin (MYCOSTATIN) ointment APPLY TWICE DAILY X 10 DAYS 30 g 0  SITagliptin (JANUVIA) 25 MG tablet Take 25 mg by mouth every morning      triamcinolone (KENALOG) 0.1 % cream Apply twice daily 80 g 1    vitamin D (CHOLECALCIFEROL) 25 MCG (1000 UT) tablet Take 1,000 Units by mouth daily            Allergies:    Allergies   Allergen Reactions    Penicillins Hives     Onset date: 11-24-2011    Shrimp [Shellfish Allergy] Swelling     Also allergic to crab             Past Surgical History:    Past Surgical History:    Procedure Laterality Date    AV FISTULA PLACEMENT  2012    CHOLECYSTECTOMY      COLONOSCOPY  05/22/2018    Results: normal. Reported by Patient.    FISTULOGRAM Left 05/14/2020    Procedure: FISTULOGRAM WITH BALLOON ANGIOPLASTY;  Surgeon: [name], MD;  Location: [facility];  Service: Vascular;  Laterality: Left;    HYSTERECTOMY      INSERTION [place] Left 07/07/2019    REVISION, A-V FISTULA UPPER EXTREMITY Left 07/21/2019    Procedure: REVISION, A-V FISTULA UPPER EXTREMITY, WITH PATCH ANGIOPLASTY;  Surgeon: [name], MD;  Location: [facility];  Service: Vascular;  Laterality: Left;    REVISION, A-V FISTULA UPPER EXTREMITY Left 05/14/2020    Procedure: REVISION, A-V FISTULA UPPER EXTREMITY WITH PATCH ANGIOPLASTY;  Surgeon: [name], MD;  Location: [facility];  Service: Vascular;  Laterality: Left;    THROMBECTOMY Left 07/21/2019    Procedure: THROMBECTOMY;  Surgeon: [name], MD;  Location: [facility];  Service: Vascular;  Laterality: Left;           Family History:    Family History   Problem Relation Age of Onset    Diabetes Mother     Cancer Father         throat           Social History:    Social History     Tobacco Use    Smoking status: Never Smoker    Smokeless tobacco: Never Used   Vaping Use    Vaping Use: Never used   Substance Use Topics    Alcohol use: No    Drug use: Never          The following sections were reviewed this encounter by the provider:   Tobacco   Allergies   Meds   Problems   Med Hx   Surg Hx   Fam Hx              Vital Signs:    Ht 1.6 m (5\' 3" )    BMI 30.07 kg/m          ROS:    Review of Systems   Constitutional: Positive for fatigue, unexpected weight change and weight loss (unsure how much). Negative for chills and fever.   Respiratory: Negative for cough.    Gastrointestinal: Positive for bloating, diarrhea and rectal pain (feels sore). Negative for abdominal pain, anal bleeding, blood in stool,  constipation and vomiting.   Neurological: Positive for dizziness (resolved with midodrine) and numbness (stable). Negative for weakness.              Physical Exam:    Physical Exam  Vitals reviewed.   Constitutional:       General: She is  not in acute distress.     Appearance: She is not ill-appearing.   HENT:      Ears:      Comments: Hard of hearing     Mouth/Throat:      Comments: No swelling in lips or tongue  Voice normal  Eyes:      General: No scleral icterus.     Extraocular Movements: Extraocular movements intact.      Conjunctiva/sclera: Conjunctivae normal.   Cardiovascular:      Comments: No cyanosis  No JVD  Pulmonary:      Effort: Pulmonary effort is normal.      Comments: No audible wheezing or stridor  No cough  Speaking in full sentences without difficulty  Abdominal:      Tenderness: There is no abdominal tenderness. There is no guarding.   Musculoskeletal:      Cervical back: Neck supple.   Neurological:      Mental Status: She is alert and oriented to person, place, and time. Mental status is at baseline.      Cranial Nerves: No cranial nerve deficit.      Comments: Some difficulty with comprehension   Psychiatric:         Mood and Affect: Mood normal.         Thought Content: Thought content normal.                Assessment:    1. Diarrhea, unspecified type  - Stool culture (IL/LC/QU); Future  - Clostridium difficile toxin B PCR; Future    2. Unintentional weight loss    3. Diabetic polyneuropathy associated with type 2 diabetes mellitus    4. Hypertensive renal disease    5. Chronic renal failure, stage 5    6. Dependence on renal dialysis            Plan:    Will obtain recent labs from nephrology  Recommend covid test  Will check stool cultures  Recommend GI evaluation if symptoms persist  Reviewed worrisome signs/symptoms, seek care if present  Reviewed ER precautions          Follow-up:    No follow-ups on file.         Ferol Luz, MD

## 2020-07-30 NOTE — Telephone Encounter (Signed)
Review how to collect stool specimens to be sent out. Containers and instructions are up front for patient to have someone come pickup. Advised need for COVID testing per Dr. Dema Severin. States was told by [facility] that she didn't need testing because she has already been vaccinated. Advised can still get COVID. Still needs testing. Advised to call testing site back to schedule and if still giving her problem have them call the office here

## 2020-08-05 DIAGNOSIS — R627 Adult failure to thrive: Secondary | ICD-10-CM | POA: Insufficient documentation

## 2020-08-06 DIAGNOSIS — L02612 Cutaneous abscess of left foot: Secondary | ICD-10-CM

## 2020-08-06 DIAGNOSIS — L03032 Cellulitis of left toe: Secondary | ICD-10-CM

## 2020-08-06 HISTORY — DX: Cellulitis of left toe: L03.032

## 2020-08-06 HISTORY — DX: Cutaneous abscess of left foot: L02.612

## 2020-08-07 ENCOUNTER — Encounter (INDEPENDENT_AMBULATORY_CARE_PROVIDER_SITE_OTHER): Payer: Self-pay | Admitting: Student in an Organized Health Care Education/Training Program

## 2020-08-07 DIAGNOSIS — M7989 Other specified soft tissue disorders: Secondary | ICD-10-CM

## 2020-08-07 DIAGNOSIS — M79675 Pain in left toe(s): Secondary | ICD-10-CM

## 2020-08-07 DIAGNOSIS — A0472 Enterocolitis due to Clostridium difficile, not specified as recurrent: Secondary | ICD-10-CM | POA: Insufficient documentation

## 2020-08-07 HISTORY — DX: Pain in left toe(s): M79.675

## 2020-08-07 HISTORY — DX: Other specified soft tissue disorders: M79.89

## 2020-08-09 ENCOUNTER — Telehealth (INDEPENDENT_AMBULATORY_CARE_PROVIDER_SITE_OTHER): Payer: Self-pay | Admitting: Student in an Organized Health Care Education/Training Program

## 2020-08-09 MED ORDER — SODIUM CHLORIDE 0.9 % IV SOLN
10.00 mL/h | INTRAVENOUS | Status: DC
Start: ? — End: 2020-08-09

## 2020-08-09 MED ORDER — PREDNISONE 20 MG PO TABS
40.00 mg | ORAL_TABLET | ORAL | Status: DC
Start: 2020-08-10 — End: 2020-08-09

## 2020-08-09 MED ORDER — ALTEPLASE 2 MG IJ SOLR
2.00 mg | INTRAMUSCULAR | Status: DC
Start: ? — End: 2020-08-09

## 2020-08-09 MED ORDER — ALBUMIN HUMAN 25 % IV SOLN
12.50 g | INTRAVENOUS | Status: DC
Start: ? — End: 2020-08-09

## 2020-08-09 MED ORDER — CALCIUM ACETATE (PHOS BINDER) 667 MG PO CAPS
1334.00 mg | ORAL_CAPSULE | ORAL | Status: DC
Start: 2020-08-09 — End: 2020-08-09

## 2020-08-09 MED ORDER — GENERIC EXTERNAL MEDICATION
Status: DC
Start: ? — End: 2020-08-09

## 2020-08-09 MED ORDER — NITROGLYCERIN 2 % TD OINT
0.50 [in_us] | TOPICAL_OINTMENT | TRANSDERMAL | Status: DC
Start: 2020-08-10 — End: 2020-08-09

## 2020-08-09 MED ORDER — MUPIROCIN 2 % EX OINT
TOPICAL_OINTMENT | CUTANEOUS | Status: DC
Start: 2020-08-09 — End: 2020-08-09

## 2020-08-09 MED ORDER — CLOTRIMAZOLE 1 % EX CREA
TOPICAL_CREAM | CUTANEOUS | Status: DC
Start: 2020-08-09 — End: 2020-08-09

## 2020-08-09 MED ORDER — HEPARIN SODIUM (PORCINE) 5000 UNIT/ML IJ SOLN
5000.00 [IU] | INTRAMUSCULAR | Status: DC
Start: 2020-08-09 — End: 2020-08-09

## 2020-08-09 MED ORDER — FAMOTIDINE 20 MG PO TABS
20.00 mg | ORAL_TABLET | ORAL | Status: DC
Start: 2020-08-10 — End: 2020-08-09

## 2020-08-09 MED ORDER — CHOLECALCIFEROL 25 MCG (1000 UT) PO TABS
1000.00 [IU] | ORAL_TABLET | ORAL | Status: DC
Start: 2020-08-10 — End: 2020-08-09

## 2020-08-09 MED ORDER — DIPHENHYDRAMINE HCL 50 MG/ML IJ SOLN
12.50 mg | INTRAMUSCULAR | Status: DC
Start: ? — End: 2020-08-09

## 2020-08-09 MED ORDER — SODIUM CHLORIDE 0.9 % IV SOLN
150.00 mL | INTRAVENOUS | Status: DC
Start: ? — End: 2020-08-09

## 2020-08-09 MED ORDER — ONDANSETRON HCL 4 MG/2ML IJ SOLN
4.00 mg | INTRAMUSCULAR | Status: DC | PRN
Start: ? — End: 2020-08-09

## 2020-08-09 MED ORDER — SODIUM CHLORIDE 0.9 % IJ SOLN
50.00 mL | INTRAMUSCULAR | Status: DC
Start: ? — End: 2020-08-09

## 2020-08-09 MED ORDER — VANCOMYCIN HCL 125 MG PO CAPS
125.00 mg | ORAL_CAPSULE | ORAL | Status: DC
Start: 2020-08-09 — End: 2020-08-09

## 2020-08-09 MED ORDER — ATORVASTATIN CALCIUM 20 MG PO TABS
20.00 mg | ORAL_TABLET | ORAL | Status: DC
Start: 2020-08-09 — End: 2020-08-09

## 2020-08-09 MED ORDER — GENERIC EXTERNAL MEDICATION
1.00 g | Status: DC
Start: 2020-08-10 — End: 2020-08-09

## 2020-08-09 MED ORDER — GABAPENTIN 300 MG PO CAPS
300.00 mg | ORAL_CAPSULE | ORAL | Status: DC
Start: 2020-08-09 — End: 2020-08-09

## 2020-08-09 MED ORDER — LORAZEPAM 2 MG/ML IJ SOLN
1.00 mg | INTRAMUSCULAR | Status: DC | PRN
Start: ? — End: 2020-08-09

## 2020-08-09 MED ORDER — ASPIRIN 81 MG PO TBEC
81.00 mg | DELAYED_RELEASE_TABLET | ORAL | Status: DC
Start: 2020-08-10 — End: 2020-08-09

## 2020-08-09 NOTE — Telephone Encounter (Signed)
Pt was admitted to [facility] due to C diff diarrhea. Discharged today. Should have hospital follow up in the next few days. This may need to be with a different provider if I don't have any openings

## 2020-08-10 ENCOUNTER — Encounter (INDEPENDENT_AMBULATORY_CARE_PROVIDER_SITE_OTHER): Payer: Self-pay | Admitting: Student in an Organized Health Care Education/Training Program

## 2020-08-10 ENCOUNTER — Telehealth (INDEPENDENT_AMBULATORY_CARE_PROVIDER_SITE_OTHER): Payer: Self-pay | Admitting: Student in an Organized Health Care Education/Training Program

## 2020-08-10 NOTE — Telephone Encounter (Signed)
Please advise if we can use same day visit early next week?

## 2020-08-10 NOTE — Telephone Encounter (Signed)
Please assist with hospital follow up per Dr. Dema Severin.

## 2020-08-10 NOTE — Telephone Encounter (Signed)
Checked schedule and no openings upcoming this week. Please advise for patient hospital discharge visit.

## 2020-08-10 NOTE — Telephone Encounter (Signed)
Patient Scheduled 08/15/2020 with Amy R. At 1:15PM

## 2020-08-10 NOTE — Telephone Encounter (Signed)
OK to use a same day for hospital follow up appointments

## 2020-08-15 ENCOUNTER — Inpatient Hospital Stay (INDEPENDENT_AMBULATORY_CARE_PROVIDER_SITE_OTHER): Payer: Medicare Other | Admitting: Registered"

## 2020-08-15 NOTE — Telephone Encounter (Signed)
Error

## 2020-08-18 ENCOUNTER — Other Ambulatory Visit (INDEPENDENT_AMBULATORY_CARE_PROVIDER_SITE_OTHER): Payer: Self-pay | Admitting: Student in an Organized Health Care Education/Training Program

## 2020-08-30 ENCOUNTER — Other Ambulatory Visit (INDEPENDENT_AMBULATORY_CARE_PROVIDER_SITE_OTHER): Payer: Self-pay | Admitting: Family Medicine

## 2020-08-30 ENCOUNTER — Telehealth (INDEPENDENT_AMBULATORY_CARE_PROVIDER_SITE_OTHER): Payer: Self-pay | Admitting: Specialist

## 2020-08-31 ENCOUNTER — Ambulatory Visit (INDEPENDENT_AMBULATORY_CARE_PROVIDER_SITE_OTHER): Payer: Medicare Other | Admitting: Specialist

## 2020-09-03 ENCOUNTER — Encounter (INDEPENDENT_AMBULATORY_CARE_PROVIDER_SITE_OTHER): Payer: Self-pay | Admitting: Student in an Organized Health Care Education/Training Program

## 2020-09-07 ENCOUNTER — Telehealth (INDEPENDENT_AMBULATORY_CARE_PROVIDER_SITE_OTHER): Payer: Self-pay | Admitting: Student in an Organized Health Care Education/Training Program

## 2020-09-07 NOTE — Telephone Encounter (Signed)
FYI:  [facility]  Phone: [phone]  Fax: [phone]    Notes: following up on the orders that where faxed and scanned in to the system if they have been sign by Dr Dema Severin and if they can please fax to the number above. There are two orders.

## 2020-09-10 NOTE — Telephone Encounter (Signed)
re-faxed

## 2020-09-10 NOTE — Telephone Encounter (Signed)
Placed in PCP in box

## 2020-09-11 ENCOUNTER — Other Ambulatory Visit (INDEPENDENT_AMBULATORY_CARE_PROVIDER_SITE_OTHER): Payer: 59

## 2020-09-12 ENCOUNTER — Ambulatory Visit (INDEPENDENT_AMBULATORY_CARE_PROVIDER_SITE_OTHER): Payer: 59 | Admitting: Specialist

## 2020-09-12 ENCOUNTER — Encounter (INDEPENDENT_AMBULATORY_CARE_PROVIDER_SITE_OTHER): Payer: Self-pay | Admitting: Specialist

## 2020-09-12 NOTE — Progress Notes (Signed)
CLINIC PROGRESS NOTE    VASCULAR SURGERY & ENDOVASCULAR INTERVENTION     Date: 09/12/2020  Patient Name: Mary [name]  Attending Physician: Jettie Booze, MD, FACS, RPVI  Chief Complaint: No chief complaint on file.      History of Present Illness:   Mary [name] is a 74 y.o. female who presents to the clinic today for evaluation regarding toe discoloration.  Pt referred by her podiatrist, Dr. Richardson Landry.  Pt said she had a black toe and now that's resolved as skin has come off.  CTA was done revealing small arteries and plaque in the arteries.  She had 3 vessel runoff, albeit a limited study.  She had an AVF in the LUE done by Dr. Lutricia Feil.  Denies rest pain.      Past Medical History:     Past Medical History:   Diagnosis Date    Abnormal vision     glasses    Arthritis     Cataracts, bilateral     Deep venous thrombosis of distal lower extremity ~2013    left leg    End-stage renal disease 2013    Hemodialysis MWF- [facility]    GERD (gastroesophageal reflux disease)     Hyperlipidemia     Hypertension     no meds    Type 2 diabetes mellitus, controlled     IDDm/ oral med. FBS ~ 119-120        Past Surgical History:     Past Surgical History:   Procedure Laterality Date    AV FISTULA PLACEMENT  2012    CHOLECYSTECTOMY      COLONOSCOPY  05/22/2018    Results: normal. Reported by Patient.    FISTULOGRAM Left 05/14/2020    Procedure: FISTULOGRAM WITH BALLOON ANGIOPLASTY;  Surgeon: [name], MD;  Location: [facility];  Service: Vascular;  Laterality: Left;    HYSTERECTOMY      INSERTION [place] Left 07/07/2019    REVISION, A-V FISTULA UPPER EXTREMITY Left 07/21/2019    Procedure: REVISION, A-V FISTULA UPPER EXTREMITY, WITH PATCH ANGIOPLASTY;  Surgeon: [name], MD;  Location: [facility];  Service: Vascular;  Laterality: Left;    REVISION, A-V FISTULA UPPER EXTREMITY Left 05/14/2020    Procedure: REVISION, A-V FISTULA UPPER EXTREMITY WITH PATCH ANGIOPLASTY;   Surgeon: [name], MD;  Location:  MAIN OR;  Service: Vascular;  Laterality: Left;    THROMBECTOMY Left 07/21/2019    Procedure: THROMBECTOMY;  Surgeon: [name], MD;  [name];  Service: Vascular;  Laterality: Left;       Family History:     Family History   Problem Relation Age of Onset    Diabetes Mother     Cancer Father         throat       Social History:     Social History     Tobacco Use    Smoking status: Never Smoker    Smokeless tobacco: Never Used   Vaping Use    Vaping Use: Never used   Substance Use Topics    Alcohol use: No    Drug use: Never       Allergies:     Allergies   Allergen Reactions    Penicillins Hives     Onset date: 11-24-2011    Shrimp [Shellfish Allergy] Swelling     Also allergic to crab  Medications:     Current Outpatient Medications:     Accu-Chek FastClix Lancets Misc, USE TO CHECK BLOOD SUGAR THREE TIMES DAILY, Disp: 300 each, Rfl: 3    Accu-Chek Guide test strip, U TID, Disp: , Rfl:     acetaminophen (TYLENOL) 500 MG tablet, Take 1 tablet (500 mg total) by mouth every 12 (twelve) hours as needed. (Patient taking differently: Take 1,000 mg by mouth every 12 (twelve) hours as needed  ), Disp: 30 tablet, Rfl: 0    aspirin 81 MG tablet, Take 81 mg by mouth daily.  , Disp: , Rfl:     atorvastatin (LIPITOR) 20 MG tablet, Take 1 tablet (20 mg total) by mouth daily, Disp: 90 tablet, Rfl: 1    BD Pen Needle Nano 2nd Gen 32G X 4 MM Misc, USE WITH INSULIN PEN TID, Disp: , Rfl:     calcium acetate (PHOSLO) 667 MG capsule, TAKE 1 CAPSULE(667 MG) BY MOUTH THREE TIMES DAILY WITH MEALS, Disp: 90 capsule, Rfl: 0    Colchicine 0.6 MG Cap, TK 1 C PO BID PRN FOR GOUT, Disp: , Rfl:     Febuxostat (ULORIC) 40 MG tablet, Take 40 mg by mouth every morning  , Disp: , Rfl:     gabapentin (NEURONTIN) 300 MG capsule, TAKE 2 CAPSULES(600 MG) BY MOUTH THREE TIMES DAILY, Disp: 540 capsule, Rfl: 0    indomethacin (INDOCIN) 50 MG  capsule, Take 50 mg by mouth 2 (two) times daily with meals, Disp: , Rfl:     insulin aspart (NovoLOG FlexPen) 100 UNIT/ML injection pen, Inject 5 Units into the skin 3 (three) times daily before meals, Disp: 2 pen, Rfl: 3    insulin lispro (HumaLOG) 100 UNIT/ML injection, Inject 5 Units into the skin 3 (three) times daily before meals, Disp: , Rfl:     Levemir FlexTouch 100 UNIT/ML injection pen, INJECT 10 UNITS INTO THE SKIN EVERY EVENING, Disp: 9 mL, Rfl: 1    Magnesium Hydroxide (DULCOLAX PO), Take by mouth as needed, Disp: , Rfl:     midodrine (PROAMATINE) 10 MG tablet, Take 10 mg by mouth as needed for low blood pressure Takes med prior to dialysis, Disp: , Rfl:     Multiple Vitamin (MULTIVITAMIN PO), Take 1 tablet by mouth daily  , Disp: , Rfl:     nystatin (MYCOSTATIN) cream, Apply topically 2 (two) times daily, Disp: 60 g, Rfl: 2    nystatin (MYCOSTATIN) ointment, APPLY TWICE DAILY X 10 DAYS, Disp: 30 g, Rfl: 0    SITagliptin (JANUVIA) 25 MG tablet, Take 25 mg by mouth every morning, Disp: , Rfl:     triamcinolone (KENALOG) 0.1 % cream, Apply twice daily, Disp: 80 g, Rfl: 1    vitamin D (CHOLECALCIFEROL) 25 MCG (1000 UT) tablet, Take 1,000 Units by mouth daily, Disp: , Rfl:     Review of Systems:   General: negative for - chills, fever, night sweats  Psychological: negative for - depression, amnesia, suicidal ideation  Ophthalmic: negative for - blurry vision, double vision, blindness  ENT: negative for - epistaxis, deafness, tinnitus, vertigo, dysphagia  Allergy and Immunology: negative for - hives  Hematological and Lymphatic:  negative for - bleeding problems, blood clots, jaundice, swollen lymph nodes  Endocrine:  negative for - palpitations, polydipsia/polyuria  Respiratory: negative for - cough, hemoptysis, shortness of breath, wheezing  Cardiovascular: negative for - chest pain, irregular heartbeat, palpitations  Gastrointestinal: negative for - abdominal pain, early satiety, blood in  stools, constipation,  diarrhea, hematemesis, melena, nausea/vomiting  Genito-Urinary: negative for - dysuria, hematuria  Musculoskeletal: negative for - joint pain, joint swelling, muscle pain or muscular weakness  Neurological: negative for - TIA or stroke symptoms, dizziness, headaches, memory loss, seizures, paralysis  Dermatological:  negative for lumps, pruritis or rash    Physical Exam:   There were no vitals filed for this visit.    There is no height or weight on file to calculate BMI.    HEENT:  N/C, AT, CN 2-12 intact, no LAD, no TMG, O/P clear, no carotid bruits  CARDIAC:  S1, S2, RRR, no M/G/R  CHEST:  CTA  ABD:  S/NT/ND, no pulsatile abdominal masses, no tenderness, no guarding, no hernias, normal BS, no abdominal or femoral bruits   EXTR:  No C/C/E, L 2nd toe with denuded skin, no varicose veins  PULSES:    R: 2+ radial, ulnar, brachial, carotid, femoral, 0 popliteal, DP, PT  L:  2+ radial, ulnar, brachial, carotid, femoral, 0 popliteal, DP, PT    Labs:   None    Rads:   Per HPI    Problem List:     Patient Active Problem List   Diagnosis    Cellulitis of left leg    Unspecified kidney failure    IDDM (insulin dependent diabetes mellitus)    Essential (primary) hypertension    Cellulitis    Chest pain at rest    Viral pericarditis    Exertional shortness of breath    Chest pain    Pleural effusion    Chronic renal failure, stage 5    Acute calculous cholecystitis    Acute pain of left knee    Bilateral low back pain with sciatica    Dependence on renal dialysis    Diabetic neuropathy    Dvt femoral (deep venous thrombosis)    Edema    Elevated LFTs    Hyperkalemia    Hyperlipidemia    Hypertensive renal disease    Neuropathy    Primary localized osteoarthritis    Spondylosis of lumbosacral region without myelopathy or radiculopathy    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 female with probable peripheral arterial disease of the left lower extremity due to nonpalpable left lower extremity pedal pulses, multiple risk factors for vascular disease, and ischemic changes to the left second toe.    Plan:   1.  I have ordered a Bilateral Lower Extremity Arterial Duplex, Ankle Brachial and Toe Brachial indices, and Ankle Pulse Volume Recording, to evaluate the location and extent of peripheral arterial disease, and guide therapy.  2.  I will inform you of the results of the study.    3.  Continue ASA and Statin.  4.  Left lower extremity angiogram, angioplasty, atherectomy, stent for limb salvage.  5.  The indications, benefits, and risks of the procedure were explained to the patient in the presence of the nurse/resident/family.  Risks including but not limited to bleeding, infection, groin puncture complication including pseudoaneurysm, embolization, vessel perforation, ischemia, retained foreign bodies, anaphylaxis, nerve damage, damage to adjacent structures, renal failure, need for adjunctive procedures, need for open surgical conversion, lack of resolution of signs and symptoms, inability to complete the procedure, limb loss, stroke, heart  attack, multisystem organ failure, and death were explained to the patient. No guarantees to surgical outcome were given or implied. The patient demonstrated understanding, and acceptance of the risks, and consented to proceed forth with the procedure.  I answered all of the patient's questions to the patient's satisfaction.  6.  My office will call to schedule the patient for the procedure as soon as possible.  7.  I will inform you as to the results of the procedure.    Thank you for allowing me the privilege of participating in the care of your patient.  Please call me if you have any questions.      Sincerely,    Kawehi Hostetter Thyra Breed, MD,  FACS, [place]  Vascular Surgery, Endovascular Intervention & Vascular Medicine     Vascular / [facility]  Assistant Professor of Vascular [place]  Tel: [phone] / Fax: [phone]    I performed a face to face clinical assessment of the patient including history and physical exam with pertinent findings noted. Additional time was spent reviewing existing diagnostic studies and constructing a diagnostic and treatment plan. Greater than 50% of the time was spent face to face in coordination and counseling regarding implementation of the plan which was discussed in detail with the patient including risks and benefits. Based on the complexity of the case, 20 minutes were spent in this process.    This note was generated by the Epic EMR system/ Dragon speech recognition and may contain inherent errors or omissions not intended by the user. Grammatical errors, random word insertions, deletions, pronoun errors and incomplete sentences are occasional consequences of this technology due to software limitations. Not all errors are caught or corrected. If there are questions or concerns about the content of this note or information contained within the body of this dictation they should be addressed directly with the author for clarification.

## 2020-09-13 ENCOUNTER — Encounter (INDEPENDENT_AMBULATORY_CARE_PROVIDER_SITE_OTHER): Payer: Self-pay

## 2020-09-13 NOTE — Progress Notes (Signed)
74 y.o. female [phone]    Appt Date/Time:  09-14-20 1030  PCP: Ferol Luz, MD  Referring Physician:  DR. Richardson Landry  New_Patient Follow_Up Post_op   US_Today US_[employer] Other_Imaging  08-07-20 CTA ABD AORTA @[facility]      Chief Complaint/HPI:  BLACKENING OF TOE- PATIENT SEEN AT [facility] 08/05/2020  Date of Last Visit:  [facility] PT FOR AVF  :  Allergies   Allergen Reactions    Penicillins Hives     Onset date: 11-24-2011    Shrimp [Shellfish Allergy] Swelling     Also allergic to crab        Selected Medications:    Coumadin Xarelto Eliquis Pradaxa Lovenox Other thinner:    Plavix Aspirin Brillinta Effient  Pletal [place] Fish_Oil   Insulin Metformin JANUVIA Atorvastatin Rosuvastatin Other Chol:    MHx:  Stroke / TIA / Seizures HTN / HLD  Diabetes   MI / CAD A_fib / Arrhythmia  COPD / Asthma / (other lung)   Kidney_Disease Liver_Disease DVT / Coagulopathy   Wounds Spine / other MSK  Cancer            Smoker           Former Smoker      X    Never Smoker    Vital Signs this Visit Pulses  HT BP R BP L TEMP   Rad Ulnar Brach Fem Pop DP PT          R          Wt HR R HR L SpO2     L          Imaging results:        Plan of Care:

## 2020-09-14 ENCOUNTER — Telehealth (INDEPENDENT_AMBULATORY_CARE_PROVIDER_SITE_OTHER): Payer: Self-pay | Admitting: Specialist

## 2020-09-14 ENCOUNTER — Encounter (INDEPENDENT_AMBULATORY_CARE_PROVIDER_SITE_OTHER): Payer: Self-pay | Admitting: Specialist

## 2020-09-14 ENCOUNTER — Ambulatory Visit (INDEPENDENT_AMBULATORY_CARE_PROVIDER_SITE_OTHER): Payer: Medicare Other | Admitting: Specialist

## 2020-09-14 VITALS — BP 85/60 | HR 82 | Temp 97.4°F | Ht 62.0 in | Wt 161.0 lb

## 2020-09-14 DIAGNOSIS — I739 Peripheral vascular disease, unspecified: Secondary | ICD-10-CM

## 2020-09-14 DIAGNOSIS — R0989 Other specified symptoms and signs involving the circulatory and respiratory systems: Secondary | ICD-10-CM

## 2020-09-14 NOTE — Telephone Encounter (Signed)
09/14/20- lvm about missed appt - CS

## 2020-09-17 ENCOUNTER — Telehealth (INDEPENDENT_AMBULATORY_CARE_PROVIDER_SITE_OTHER): Payer: Self-pay

## 2020-09-17 DIAGNOSIS — N185 Chronic kidney disease, stage 5: Secondary | ICD-10-CM

## 2020-09-17 DIAGNOSIS — Z01818 Encounter for other preprocedural examination: Secondary | ICD-10-CM

## 2020-09-17 DIAGNOSIS — I739 Peripheral vascular disease, unspecified: Secondary | ICD-10-CM

## 2020-09-17 DIAGNOSIS — E878 Other disorders of electrolyte and fluid balance, not elsewhere classified: Secondary | ICD-10-CM

## 2020-09-17 NOTE — Telephone Encounter (Signed)
-----   Message from Jettie Booze, MD sent at 09/14/2020 12:11 PM EDT -----  Please call patient to schedule:    Procedure: LLE PAD  DX: LLE Gangrene  Anesthesia: IV Sedation  and Local   Location: Cath Lab, [facility], or Tower  Cardiology clearance: No   Special instructions: None    Maseer Thyra Breed, MD, FACS, RPVI  Vascular Surgery, Endovascular Intervention & Vascular Medicine    [facility]  Assistant Professor of Vascular [place]   Tel: [phone] / Fax: [phone]  Cell:  [phone]  [email or web address]

## 2020-09-18 ENCOUNTER — Ambulatory Visit (INDEPENDENT_AMBULATORY_CARE_PROVIDER_SITE_OTHER): Payer: Medicare Other | Admitting: Student in an Organized Health Care Education/Training Program

## 2020-09-18 ENCOUNTER — Encounter (INDEPENDENT_AMBULATORY_CARE_PROVIDER_SITE_OTHER): Payer: Self-pay | Admitting: Student in an Organized Health Care Education/Training Program

## 2020-09-18 VITALS — BP 112/62 | HR 60 | Temp 97.3°F | Ht 62.0 in | Wt 159.4 lb

## 2020-09-18 DIAGNOSIS — E11628 Type 2 diabetes mellitus with other skin complications: Secondary | ICD-10-CM

## 2020-09-18 DIAGNOSIS — R413 Other amnesia: Secondary | ICD-10-CM

## 2020-09-18 DIAGNOSIS — L03119 Cellulitis of unspecified part of limb: Secondary | ICD-10-CM

## 2020-09-18 DIAGNOSIS — Z794 Long term (current) use of insulin: Secondary | ICD-10-CM

## 2020-09-18 DIAGNOSIS — L97521 Non-pressure chronic ulcer of other part of left foot limited to breakdown of skin: Secondary | ICD-10-CM

## 2020-09-18 DIAGNOSIS — E1121 Type 2 diabetes mellitus with diabetic nephropathy: Secondary | ICD-10-CM

## 2020-09-18 DIAGNOSIS — E11621 Type 2 diabetes mellitus with foot ulcer: Secondary | ICD-10-CM

## 2020-09-18 DIAGNOSIS — A0472 Enterocolitis due to Clostridium difficile, not specified as recurrent: Secondary | ICD-10-CM

## 2020-09-18 DIAGNOSIS — I739 Peripheral vascular disease, unspecified: Secondary | ICD-10-CM

## 2020-09-18 NOTE — Progress Notes (Signed)
Subjective:      Patient ID: Mary [name] is a 74 y.o. female.    Chief Complaint:  Chief Complaint   Patient presents with    F/U from Rehab       HPI:  74 YO Female  Presents for hospital follow up    Admitted to [facility] 08/05/20-08/09/20 due to diarrhea  Diagnosed with C. Diff in the hospital  Treated with oral vancomycin  Denies diarrhea, Nausea, vomiting, or abdominal pain at this point  Stools are looser than baseline but much better than it was    Cellulitis of L great toe  Treated with antibiotics  Was treated with prednisone for possible gout  Reports that redness and swelling have improved  Ulcer on side of toe, seeing Dr Clinton Gallant Dr Rob Bunting last week  Has arterial study scheduled    Discharged to rehab facility, unsure which one she was at  Discharged from rehab 1-2 weeks ago  Getting home health    CKD- dialysis 3 days/week  Had labs 2 days ago for dialysis    Using nystatin for area of itching between buttocks  Was not getting this at rehab  Working since she restarted it      Problem List:  Patient Active Problem List   Diagnosis    Cellulitis of left leg    Unspecified kidney failure    IDDM (insulin dependent diabetes mellitus)    Essential (primary) hypertension    Cellulitis    Chest pain at rest    Viral pericarditis    Exertional shortness of breath    Chest pain    Pleural effusion    Chronic renal failure, stage 5    Acute calculous cholecystitis    Acute pain of left knee    Bilateral low back pain with sciatica    Dependence on renal dialysis    Diabetic neuropathy    Dvt femoral (deep venous thrombosis)    Edema    Elevated LFTs    Hyperkalemia    Hyperlipidemia    Hypertensive renal disease    Neuropathy    Primary localized osteoarthritis    Spondylosis of lumbosacral region without myelopathy or radiculopathy    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    Congestive heart failure    Pure hypercholesterolemia, unspecified     Disease of pericardium    Long-term insulin use    PVD (peripheral vascular disease)    Cellulitis and abscess of toe of left foot    Clostridioides difficile diarrhea    Failure to thrive in adult    Pain and swelling of toe of left foot       Current Medications:  Current Outpatient Medications   Medication Sig Dispense Refill    Accu-Chek FastClix Lancets Misc USE TO CHECK BLOOD SUGAR THREE TIMES DAILY 300 each 3    Accu-Chek Guide test strip U TID      acetaminophen (TYLENOL) 500 MG tablet Take 1 tablet (500 mg total) by mouth every 12 (twelve) hours as needed. (Patient taking differently: Take 1,000 mg by mouth every 12 (twelve) hours as needed   ) 30 tablet 0    aspirin 81 MG tablet Take 81 mg by mouth daily.        atorvastatin (LIPITOR) 20 MG tablet Take 1 tablet (20 mg total) by mouth daily 90 tablet 1    BD Pen Needle Nano 2nd Gen 32G X 4 MM Misc  USE WITH INSULIN PEN TID      calcium acetate (PHOSLO) 667 MG capsule TAKE 1 CAPSULE(667 MG) BY MOUTH THREE TIMES DAILY WITH MEALS 90 capsule 0    Colchicine 0.6 MG Cap TK 1 C PO BID PRN FOR GOUT      Febuxostat (ULORIC) 40 MG tablet Take 40 mg by mouth every morning         gabapentin (NEURONTIN) 300 MG capsule TAKE 2 CAPSULES(600 MG) BY MOUTH THREE TIMES DAILY 540 capsule 0    insulin aspart (NovoLOG FlexPen) 100 UNIT/ML injection pen Inject 5 Units into the skin 3 (three) times daily before meals 2 pen 3    insulin lispro (HumaLOG) 100 UNIT/ML injection Inject 5 Units into the skin 3 (three) times daily before meals      Levemir FlexTouch 100 UNIT/ML injection pen INJECT 10 UNITS INTO THE SKIN EVERY EVENING 9 mL 1    Magnesium Hydroxide (DULCOLAX PO) Take by mouth as needed      midodrine (PROAMATINE) 10 MG tablet Take 10 mg by mouth as needed for low blood pressure Takes med prior to dialysis      Multiple Vitamin (MULTIVITAMIN PO) Take 1 tablet by mouth daily         nystatin (MYCOSTATIN) cream Apply topically 2 (two) times daily 60  g 2    SITagliptin (JANUVIA) 25 MG tablet Take 25 mg by mouth every morning      triamcinolone (KENALOG) 0.1 % cream Apply twice daily 80 g 1    vitamin D (CHOLECALCIFEROL) 25 MCG (1000 UT) tablet Take 1,000 Units by mouth daily      B Complex-C-Folic Acid (Rena-Vite) Tab Take 1 tablet by mouth daily      indomethacin (INDOCIN) 50 MG capsule Take 50 mg by mouth 2 (two) times daily with meals       No current facility-administered medications for this visit.       Allergies:  Allergies   Allergen Reactions    Penicillins Hives     Onset date: 11-24-2011    Shrimp [Shellfish Allergy] Swelling     Also allergic to crab       Past Medical History:  Past Medical History:   Diagnosis Date    Abnormal vision     glasses    Arthritis     Cataracts, bilateral     Deep venous thrombosis of distal lower extremity ~2013    left leg    End-stage renal disease 2013    Hemodialysis MWF- [facility]    GERD (gastroesophageal reflux disease)     Hyperlipidemia     Hypertension     no meds    Type 2 diabetes mellitus, controlled     IDDm/ oral med. FBS ~ 119-120        Past Surgical History:  Past Surgical History:   Procedure Laterality Date    AV FISTULA PLACEMENT  2012    CHOLECYSTECTOMY      COLONOSCOPY  05/22/2018    Results: normal. Reported by Patient.    FISTULOGRAM Left 05/14/2020    Procedure: FISTULOGRAM WITH BALLOON ANGIOPLASTY;  Surgeon: [name], MD;  Location: [facility];  Service: Vascular;  Laterality: Left;    HYSTERECTOMY      INSERTION [place] Left 07/07/2019    REVISION, A-V FISTULA UPPER EXTREMITY Left 07/21/2019    Procedure: REVISION, A-V FISTULA UPPER EXTREMITY, WITH PATCH ANGIOPLASTY;  Surgeon: [name], MD;  [facility];  Service: Vascular;  Laterality: Left;    REVISION, A-V FISTULA UPPER EXTREMITY Left 05/14/2020    Procedure: REVISION, A-V FISTULA UPPER EXTREMITY WITH PATCH ANGIOPLASTY;  Surgeon: [name], MD;  Location:   MAIN OR;  Service: Vascular;  Laterality: Left;    THROMBECTOMY Left 07/21/2019    Procedure: THROMBECTOMY;  Surgeon: [name], MD;  [name];  Service: Vascular;  Laterality: Left;       Family History:  Family History   Problem Relation Age of Onset    Diabetes Mother     Cancer Father         throat       Social History:  Social History     Socioeconomic History    Marital status: Widowed     Spouse name: Not on file    Number of children: Not on file    Years of education: Not on file    Highest education level: Not on file   Occupational History    Not on file   Tobacco Use    Smoking status: Never Smoker    Smokeless tobacco: Never Used   Vaping Use    Vaping Use: Never used   Substance and Sexual Activity    Alcohol use: No    Drug use: Never    Sexual activity: Not on file   Other Topics Concern    Not on file   Social History Narrative    Not on file     Social Determinants of Health     Financial Resource Strain:     Difficulty of Paying Living Expenses:    Food Insecurity:     Worried About [place] in the Last Year:     Arboriculturist in the Last Year:    Transportation Needs:     Film/video editor (Medical):     Lack of Transportation (Non-Medical):    Physical Activity:     Days of Exercise per Week:     Minutes of Exercise per Session:    Stress:     Feeling of Stress :    Social Connections:     Frequency of Communication with Friends and Family:     Frequency of Social Gatherings with Friends and Family:     Attends Religious Services:     Active Member of Clubs or Organizations:     Attends Archivist Meetings:     Marital Status:    Intimate Partner Violence:     Fear of Current or Ex-Partner:     Emotionally Abused:     Physically Abused:     Sexually Abused:        The following sections were reviewed this encounter by the provider:   Tobacco   Allergies   Meds   Problems   Med Hx   Surg Hx   Fam  Hx          ROS:  Review of Systems   Constitutional: Positive for fatigue. Negative for appetite change and fever.   HENT: Positive for trouble swallowing. Negative for voice change.    Respiratory: Negative for cough and shortness of breath.    Cardiovascular: Negative for chest pain.   Gastrointestinal: Positive for diarrhea (improving). Negative for abdominal pain, constipation, nausea and vomiting.   Genitourinary:        Makes small amount of urine   Musculoskeletal: Positive for  arthralgias.   Psychiatric/Behavioral: Positive for confusion. Negative for dysphoric mood. The patient is not nervous/anxious.        Vitals:  BP 112/62    Pulse 60    Temp 97.3 F (36.3 C)    Ht 1.575 m (5\' 2" )    Wt 72.3 kg (159 lb 6.4 oz)    BMI 29.15 kg/m      Objective:     Physical Exam:  Physical Exam  Constitutional:       General: She is not in acute distress.     Appearance: She is not ill-appearing.      Comments: Poor historian   Eyes:      General: No scleral icterus.     Extraocular Movements: Extraocular movements intact.   Cardiovascular:      Rate and Rhythm: Normal rate and regular rhythm.      Heart sounds: No murmur heard.        Comments: No edema  Pulmonary:      Effort: Pulmonary effort is normal. No respiratory distress.      Breath sounds: Normal breath sounds. No stridor. No wheezing or rhonchi.   Abdominal:      General: There is no distension.      Palpations: There is no mass.      Tenderness: There is no abdominal tenderness.   Musculoskeletal:      Cervical back: Neck supple. No rigidity.      Right lower leg: No edema.      Left lower leg: No edema.      Comments: Diminished pulses in L foot; ulcer present on L middle toe, no purulence; no swelling or erythema   Skin:     Comments: Erythema over gluteal cleft, no drainage or purulence; no ulcers   Neurological:      Mental Status: She is oriented to person, place, and time.      Cranial Nerves: No cranial nerve deficit.      Sensory: Sensory deficit  (diminshed over both feet) present.      Gait: Gait abnormal.      Comments: Comprehension impaired  Speech normal   Psychiatric:         Mood and Affect: Mood normal.         Thought Content: Thought content normal.         Cognition and Memory: Cognition is impaired. Memory is impaired.          Assessment:     1. C. difficile colitis    2. Cellulitis in diabetic foot    3. Peripheral arterial disease    4. Diabetic ulcer of toe of left foot associated with type 2 diabetes mellitus, limited to breakdown of skin    5. Type 2 diabetes mellitus with diabetic nephropathy, with long-term current use of insulin    6. Impaired memory      Plan:   Hospital records reviewed  Reviewed hospital records  Will obtain recent labs from nephrology  Follow up with vascular as scheduled  Refer to GI if not continuing to improve  Continue home health  Pt confused about her current medications  Reviewed pt's med list in detail  30 min spent with patient and reviewing hospital records  Ferol Luz, MD

## 2020-09-19 ENCOUNTER — Encounter (INDEPENDENT_AMBULATORY_CARE_PROVIDER_SITE_OTHER): Payer: Self-pay | Admitting: Student in an Organized Health Care Education/Training Program

## 2020-09-20 ENCOUNTER — Telehealth (INDEPENDENT_AMBULATORY_CARE_PROVIDER_SITE_OTHER): Payer: Self-pay | Admitting: Student in an Organized Health Care Education/Training Program

## 2020-09-20 ENCOUNTER — Inpatient Hospital Stay (INDEPENDENT_AMBULATORY_CARE_PROVIDER_SITE_OTHER): Payer: Medicare Other | Admitting: Adult Health

## 2020-09-20 NOTE — Telephone Encounter (Signed)
Form completed for pt's daughter to take FMLA to assist pt. Placed in my outbox folder at station 1. Please notify daughter form is ready for pickup.

## 2020-09-21 ENCOUNTER — Telehealth (INDEPENDENT_AMBULATORY_CARE_PROVIDER_SITE_OTHER): Payer: Self-pay | Admitting: Student in an Organized Health Care Education/Training Program

## 2020-09-21 NOTE — Telephone Encounter (Signed)
Daughter notified . Will come by to pickup.

## 2020-09-21 NOTE — Telephone Encounter (Signed)
Name of Requester: Patient  Clinical Question: Has questions (Could not elaborate) regarding forms/follow up from rehab and wanted to speak with Clinical.   Last DOS: 09/18/2020  Requesters preferred ph: [phone]  Please note:

## 2020-09-21 NOTE — Telephone Encounter (Signed)
Daughter advised. Will come by to pickup forms.

## 2020-09-24 ENCOUNTER — Encounter (INDEPENDENT_AMBULATORY_CARE_PROVIDER_SITE_OTHER): Payer: Self-pay | Admitting: Student in an Organized Health Care Education/Training Program

## 2020-09-26 ENCOUNTER — Encounter (INDEPENDENT_AMBULATORY_CARE_PROVIDER_SITE_OTHER): Payer: Self-pay | Admitting: Student in an Organized Health Care Education/Training Program

## 2020-09-28 ENCOUNTER — Encounter (INDEPENDENT_AMBULATORY_CARE_PROVIDER_SITE_OTHER): Payer: Self-pay

## 2020-09-28 ENCOUNTER — Ambulatory Visit (INDEPENDENT_AMBULATORY_CARE_PROVIDER_SITE_OTHER): Payer: Medicare Other

## 2020-09-28 ENCOUNTER — Telehealth (INDEPENDENT_AMBULATORY_CARE_PROVIDER_SITE_OTHER): Payer: Self-pay

## 2020-09-28 DIAGNOSIS — I739 Peripheral vascular disease, unspecified: Secondary | ICD-10-CM

## 2020-09-28 NOTE — Telephone Encounter (Addendum)
error 

## 2020-09-29 ENCOUNTER — Encounter (INDEPENDENT_AMBULATORY_CARE_PROVIDER_SITE_OTHER): Payer: Self-pay

## 2020-10-08 ENCOUNTER — Encounter (INDEPENDENT_AMBULATORY_CARE_PROVIDER_SITE_OTHER): Payer: Self-pay | Admitting: Student in an Organized Health Care Education/Training Program

## 2020-10-09 ENCOUNTER — Telehealth (INDEPENDENT_AMBULATORY_CARE_PROVIDER_SITE_OTHER): Payer: Self-pay | Admitting: Student in an Organized Health Care Education/Training Program

## 2020-10-09 NOTE — Telephone Encounter (Signed)
Name of Requester: Patient (walked in)  Clinical Question: Requesting for a form or letter  that can be submitted to the [facility] for handicap   Last DOS: 09/18/20  Requesters preferred ph: [phone]  Please note: Patient is requesting to have a call back, to let her know if the form is ready. Patient did not give any DMV form.

## 2020-10-10 ENCOUNTER — Encounter (INDEPENDENT_AMBULATORY_CARE_PROVIDER_SITE_OTHER): Payer: Self-pay

## 2020-10-10 ENCOUNTER — Encounter (INDEPENDENT_AMBULATORY_CARE_PROVIDER_SITE_OTHER): Payer: Self-pay | Admitting: Student in an Organized Health Care Education/Training Program

## 2020-10-10 NOTE — Progress Notes (Addendum)
Appt Date/Time: 04/17/21 12 pm    74 y.o. female   PCP: Ferol Luz, MD  Referring Physician:    New Patient Follow-Up Post-op   [place]  Today [place] [facility] Other Imaging     Chief Complaint/HPI:  f/u left AVF  Date of Last Visit: 01/30/21     Allergies:   Allergies   Allergen Reactions    Penicillins Hives     Onset date: 11-24-2011    Shrimp [Shellfish Allergy] Swelling     Also allergic to crab        Selected Medications:    Coumadin Xarelto Eliquis Pradaxa Lovenox Other thinner:    Plavix Aspirin Brillinta Effient  Pletal Trental Fish Oil   Insulin Metformin Other Diabetes  Januvia Atorvastatin Rosuvastatin Other Chol:    MHx:  Stroke / TIA / Seizures HTN / HLD  Diabetes   MI / CAD A-fib / Arrhythmia  COPD / Asthma / (other lung)   Kidney Disease Liver Disease DVT / Coagulopathy   Wounds Spine / other MSK  Cancer            Smoker           Former Smoker      x    Never Smoker    Vital Signs this Visit Pulses  HT  BP R BP L Temp   Rad Ulnar Brach Fem Pop DP PT        R          WT  Pulse R Pulse L SpO2       L          Imaging results:        Plan of Care:

## 2020-10-11 NOTE — Telephone Encounter (Signed)
Please advise 

## 2020-10-11 NOTE — Telephone Encounter (Signed)
Please have pt/daughter download the [facility] form and complete their portion. Once their portion is complete, I can fill out my portion

## 2020-10-12 NOTE — Telephone Encounter (Signed)
Left message to call back  

## 2020-10-12 NOTE — Telephone Encounter (Signed)
Patient advised she needs to complete her portion of the DMV form before Dr. Dema Severin can complete her portion. She or her daughter can download the form, complete it, and then send it to [place] through MyChart or bring it into the office for Dr. Dema Severin to complete.t

## 2020-10-12 NOTE — Telephone Encounter (Addendum)
Surgeon:  Dr. Dorise Bullion   Procedure:  Left lower extremity angiogram, angioplasty, atherectomy, stent   CPT code:   [phone], 23762, 83151, 76160, 73710, [id number] OUTPATIENT  DX:  LLE gangrene       I96  Location:  [facility]  Date/time:  11/11    @  9am  Arrival time:   7:30am  SX packet delivery: mail  Medication instructions:  need    I have requested blood work results from :      [facility]  [address]  [place] [address]  [phone] office  [phone] fax

## 2020-10-12 NOTE — Telephone Encounter (Signed)
Patient called requesting clinical staff please give her a call back because she missed their call.    Ph: [phone]

## 2020-10-15 NOTE — Telephone Encounter (Signed)
COVID orders placed into Epic.  No allergies to iodine/contrast. The following pre op med instructions to be sent to pt:    Dear Mary [name],    Below are your medication instructions for your upcoming procedure with Dr Rob Bunting:    Day of Procedure Medication Instructions:  o You must have nothing to eat or drink from midnight onwards the morning of procedure.      o If Levemir insulin is taken in the evening, please take  the dose the evening before the procedure.      o You must NOT take the following medications the morning of procedure.  - Vitamins or supplements (Rena-Vite, Phoslo,Multivitamin, Vit D)  - Diabetes meds- insulins (Levemir, Humalog) & Januvia  - Stool softners/ laxatives      You may take your evening meds as scheduled the night before. Any medications taken in the morning need to be taken with sips of water. The following medications are ok to continue taking:  o Febuxostat (Uloric)  o Indomethacin (Indocin)  o Aspirin  o Cholchicine  o Gabapentin  o Atorvastatin  o Midodrine      Pre-Op Testing:  - CBC, BMP, COVID: pending from dialysis center

## 2020-10-16 NOTE — Telephone Encounter (Signed)
Surgery packet emailed to patient.

## 2020-10-16 NOTE — Telephone Encounter (Signed)
Surgery  packet mailed to patient included:  Hospital map  covid orders  Lab locations  Medication instruction sheet  date/time of surgery and post-op appointment

## 2020-10-17 ENCOUNTER — Other Ambulatory Visit (INDEPENDENT_AMBULATORY_CARE_PROVIDER_SITE_OTHER): Payer: Medicare Other

## 2020-10-17 ENCOUNTER — Ambulatory Visit (INDEPENDENT_AMBULATORY_CARE_PROVIDER_SITE_OTHER): Payer: Medicare Other | Admitting: Specialist

## 2020-10-17 ENCOUNTER — Encounter (INDEPENDENT_AMBULATORY_CARE_PROVIDER_SITE_OTHER): Payer: Self-pay | Admitting: Student in an Organized Health Care Education/Training Program

## 2020-10-17 ENCOUNTER — Telehealth (INDEPENDENT_AMBULATORY_CARE_PROVIDER_SITE_OTHER): Payer: Self-pay

## 2020-10-17 NOTE — Telephone Encounter (Signed)
-   Scheduling called pt to reschedule missed appt today.   - States pt did not know why she had appt scheduled or why she sees Dr. Rob Bunting and Dr. Lutricia Feil. Pt also unaware of 11/11 sx with Dr. Rob Bunting.   - Called mobile number on file. No answer. Unable to leave VM (VM box full).   - Called pt's daughter. She is unsure of pt's questions, but will see pt after work today and have her call our office.

## 2020-10-18 ENCOUNTER — Ambulatory Visit (INDEPENDENT_AMBULATORY_CARE_PROVIDER_SITE_OTHER): Payer: Medicare Other

## 2020-10-18 DIAGNOSIS — I739 Peripheral vascular disease, unspecified: Secondary | ICD-10-CM

## 2020-10-18 DIAGNOSIS — N185 Chronic kidney disease, stage 5: Secondary | ICD-10-CM

## 2020-10-26 NOTE — Telephone Encounter (Signed)
Pre-Op Testing:  - CBC, BMP: remains pending  -COVID: remains pending

## 2020-10-29 ENCOUNTER — Encounter (INDEPENDENT_AMBULATORY_CARE_PROVIDER_SITE_OTHER): Payer: Self-pay

## 2020-10-29 NOTE — Telephone Encounter (Signed)
Blood work received - know ESRD on dialysis  elevated creatinine 5.32, calcium 10.5  Low Cl- 94  Plts 149 -OK for angio  These labs [facility] for LLE angiogram  Recommend repeating BMP AM of surgery given low Cl- on recent blood work and that pt is currently on dialysis and will likely need repeat K+ AM of angiogram

## 2020-10-29 NOTE — Telephone Encounter (Signed)
Will not need to be covid tested. Will need CBC, BMP most recent from dialysis center.

## 2020-10-29 NOTE — Addendum Note (Signed)
Addended by: Trudie Buckler on: 10/29/2020 05:31 PM     Modules accepted: Orders

## 2020-10-29 NOTE — Telephone Encounter (Signed)
RN Jamas Lav from pre-surgical services at [facility] called to informed the office that patient has tested positive for  covid-19.    Patient rescheduled to:    Surgeon:Dr. Maseer Bade   Procedure: Left lower extremityangiogram, angioplasty, atherectomy, stent  CPT code:   [phone], 23762, 83151, 76160, 73710, 37227 OUTPATIENT  DX:  LLE gangrene       I96  Location:  [facility]  Date/time:  12/1    @  1:66m  Arrival time:  12:47m  SX packet delivery:mail  Medication instructions:need    Blood work done on 10/29/20

## 2020-10-29 NOTE — Progress Notes (Signed)
Room:         Appt Date/Time: 11/10 1220pm    74 y.o. female   PCP: Ferol Luz, MD  Referring Physician:    New Patient Follow-Up Post-op   [place] Today [place] [facility] 10/28 Other Imaging     Chief Complaint/HPI: f/u- Left AVF  -PT sees [facility] for PAD related issues  Date of Last Visit: 07/18/20 with [facility] 09/14/20 with [facility]     Allergies:   Allergies   Allergen Reactions    Penicillins Hives     Onset date: 11-24-2011    Shrimp [Shellfish Allergy] Swelling     Also allergic to crab        Selected Medications:    Coumadin Xarelto Eliquis Pradaxa Lovenox Other thinner:    Plavix Aspirin Brillinta Effient  Pletal Trental Fish Oil   Insulin Metformin Other Diabetes Atorvastatin Rosuvastatin Other Chol:    MHx:  Stroke / TIA / Seizures HTN / HLD  Diabetes   MI / CAD A-fib / Arrhythmia  COPD / Asthma / (other lung)   Kidney Disease Liver Disease DVT / Coagulopathy   Wounds Spine / other MSK  Cancer            Smoker           Former Smoker          Never Smoker    Vital Signs this Visit Pulses  HT  BP R BP L Temp   Rad Ulnar Brach Fem Pop DP PT        R          WT  Pulse R Pulse L SpO2       L          Imaging results:        Plan of Care:

## 2020-10-30 ENCOUNTER — Encounter (INDEPENDENT_AMBULATORY_CARE_PROVIDER_SITE_OTHER): Payer: Self-pay

## 2020-10-31 ENCOUNTER — Ambulatory Visit (INDEPENDENT_AMBULATORY_CARE_PROVIDER_SITE_OTHER): Payer: Medicare Other | Admitting: Specialist

## 2020-11-01 ENCOUNTER — Encounter (INDEPENDENT_AMBULATORY_CARE_PROVIDER_SITE_OTHER): Payer: Medicare Other | Admitting: Specialist

## 2020-11-02 ENCOUNTER — Ambulatory Visit (INDEPENDENT_AMBULATORY_CARE_PROVIDER_SITE_OTHER): Payer: Medicare Other | Admitting: Specialist

## 2020-11-06 NOTE — Telephone Encounter (Signed)
Surgery packet mailed out to patient.

## 2020-11-13 NOTE — Telephone Encounter (Addendum)
Pre-Op Testing:  - CBC, BMP in Epic from 11/8- abnormal due to ESRD on dialysis. DOS BMP sent to [facility] testing not required- was + on 10/29/20

## 2020-11-17 ENCOUNTER — Other Ambulatory Visit (INDEPENDENT_AMBULATORY_CARE_PROVIDER_SITE_OTHER): Payer: Self-pay | Admitting: Student in an Organized Health Care Education/Training Program

## 2020-11-17 DIAGNOSIS — E1142 Type 2 diabetes mellitus with diabetic polyneuropathy: Secondary | ICD-10-CM

## 2020-11-20 ENCOUNTER — Encounter (INDEPENDENT_AMBULATORY_CARE_PROVIDER_SITE_OTHER): Payer: Self-pay | Admitting: Specialist

## 2020-11-21 ENCOUNTER — Encounter (INDEPENDENT_AMBULATORY_CARE_PROVIDER_SITE_OTHER): Payer: Medicare Other | Admitting: Specialist

## 2020-11-21 DIAGNOSIS — I70245 Atherosclerosis of native arteries of left leg with ulceration of other part of foot: Secondary | ICD-10-CM

## 2020-11-22 ENCOUNTER — Other Ambulatory Visit (INDEPENDENT_AMBULATORY_CARE_PROVIDER_SITE_OTHER): Payer: Self-pay | Admitting: Student in an Organized Health Care Education/Training Program

## 2020-11-26 ENCOUNTER — Telehealth (INDEPENDENT_AMBULATORY_CARE_PROVIDER_SITE_OTHER): Payer: Self-pay

## 2020-11-26 ENCOUNTER — Telehealth (INDEPENDENT_AMBULATORY_CARE_PROVIDER_SITE_OTHER): Payer: Self-pay | Admitting: Specialist

## 2020-11-26 NOTE — Telephone Encounter (Signed)
Patient s/p LLE angio with Dr. Rob Bunting 12/01.  - Called stating that she removed R groin dressing Saturday. States that when she removed it, the groin area was irritated.  - States that the tape was on her "private" and that her "private is irritated". Denies that dressing was touching the vagina or labia. States it is to the side of it.  - Denies drainage coming from puncture site but states that pants felt a little bit wet.   - Denies redness or inflammation at the site.  - States that she has been cleaning the area with soap and water, and patting dry with a clean paper towel.  - Patient advised to cover the puncture site with a Band-Aid. States that she cannot do this because this is on her pubic hair. Advised that the catheter entry site would have been shaved, or further away from the pubic hair.  - Advised that if she is having discomfort in the pubic area, it could be from her removing tape from groin entry site.  - Patient unable to determine if she has had fever of chills, states "I am old and always cold".  - Patient again states that the area is wet.  - Based on inability to discern issue over the phone, based on patient descriptions, and answers to questions, advised that we should schedule OV for her.  - Message sent to Nps for possible visit.

## 2020-11-26 NOTE — Telephone Encounter (Signed)
Called and spoke with patient to offer an appointment in [place] office with Benjamine Mola. Patient declined appointment that was provider by nurses stated she would go to her primary care to get care.

## 2020-11-28 ENCOUNTER — Encounter (INDEPENDENT_AMBULATORY_CARE_PROVIDER_SITE_OTHER): Payer: Self-pay

## 2020-11-29 ENCOUNTER — Ambulatory Visit (INDEPENDENT_AMBULATORY_CARE_PROVIDER_SITE_OTHER): Payer: 59 | Admitting: Registered Nurse

## 2020-11-29 ENCOUNTER — Other Ambulatory Visit (INDEPENDENT_AMBULATORY_CARE_PROVIDER_SITE_OTHER): Payer: 59

## 2020-11-29 ENCOUNTER — Encounter (INDEPENDENT_AMBULATORY_CARE_PROVIDER_SITE_OTHER): Payer: Self-pay

## 2020-12-12 ENCOUNTER — Other Ambulatory Visit (INDEPENDENT_AMBULATORY_CARE_PROVIDER_SITE_OTHER): Payer: Self-pay | Admitting: Student in an Organized Health Care Education/Training Program

## 2020-12-12 DIAGNOSIS — E1142 Type 2 diabetes mellitus with diabetic polyneuropathy: Secondary | ICD-10-CM

## 2020-12-27 ENCOUNTER — Encounter (INDEPENDENT_AMBULATORY_CARE_PROVIDER_SITE_OTHER): Payer: Self-pay | Admitting: Student in an Organized Health Care Education/Training Program

## 2020-12-27 ENCOUNTER — Ambulatory Visit (FREE_STANDING_LABORATORY_FACILITY): Payer: Medicare Other | Admitting: Student in an Organized Health Care Education/Training Program

## 2020-12-27 ENCOUNTER — Telehealth (INDEPENDENT_AMBULATORY_CARE_PROVIDER_SITE_OTHER): Payer: Self-pay | Admitting: Student in an Organized Health Care Education/Training Program

## 2020-12-27 VITALS — BP 119/64 | HR 85 | Temp 98.6°F | Ht 62.0 in | Wt 164.4 lb

## 2020-12-27 DIAGNOSIS — Z794 Long term (current) use of insulin: Secondary | ICD-10-CM

## 2020-12-27 DIAGNOSIS — R7989 Other specified abnormal findings of blood chemistry: Secondary | ICD-10-CM

## 2020-12-27 DIAGNOSIS — I739 Peripheral vascular disease, unspecified: Secondary | ICD-10-CM

## 2020-12-27 DIAGNOSIS — E213 Hyperparathyroidism, unspecified: Secondary | ICD-10-CM

## 2020-12-27 DIAGNOSIS — Z0001 Encounter for general adult medical examination with abnormal findings: Secondary | ICD-10-CM

## 2020-12-27 DIAGNOSIS — I129 Hypertensive chronic kidney disease with stage 1 through stage 4 chronic kidney disease, or unspecified chronic kidney disease: Secondary | ICD-10-CM

## 2020-12-27 DIAGNOSIS — I509 Heart failure, unspecified: Secondary | ICD-10-CM

## 2020-12-27 DIAGNOSIS — Z1231 Encounter for screening mammogram for malignant neoplasm of breast: Secondary | ICD-10-CM

## 2020-12-27 DIAGNOSIS — D539 Nutritional anemia, unspecified: Secondary | ICD-10-CM

## 2020-12-27 DIAGNOSIS — E1121 Type 2 diabetes mellitus with diabetic nephropathy: Secondary | ICD-10-CM

## 2020-12-27 DIAGNOSIS — Z992 Dependence on renal dialysis: Secondary | ICD-10-CM

## 2020-12-27 DIAGNOSIS — D696 Thrombocytopenia, unspecified: Secondary | ICD-10-CM

## 2020-12-27 DIAGNOSIS — Z780 Asymptomatic menopausal state: Secondary | ICD-10-CM

## 2020-12-27 DIAGNOSIS — N185 Chronic kidney disease, stage 5: Secondary | ICD-10-CM

## 2020-12-27 DIAGNOSIS — E1142 Type 2 diabetes mellitus with diabetic polyneuropathy: Secondary | ICD-10-CM

## 2020-12-27 LAB — TSH: TSH: 0.6 u[IU]/mL (ref 0.35–4.94)

## 2020-12-27 LAB — CBC AND DIFFERENTIAL
Absolute NRBC: 0 10*3/uL (ref 0.00–0.00)
Basophils Absolute Automated: 0.04 10*3/uL (ref 0.00–0.08)
Basophils Automated: 0.5 %
Eosinophils Absolute Automated: 0.31 10*3/uL (ref 0.00–0.44)
Eosinophils Automated: 4.1 %
Hematocrit: 35.9 % (ref 34.7–43.7)
Hgb: 11.3 g/dL — ABNORMAL LOW (ref 11.4–14.8)
Immature Granulocytes Absolute: 0.02 10*3/uL (ref 0.00–0.07)
Immature Granulocytes: 0.3 %
Lymphocytes Absolute Automated: 1.69 10*3/uL (ref 0.42–3.22)
Lymphocytes Automated: 22.4 %
MCH: 32.1 pg (ref 25.1–33.5)
MCHC: 31.5 g/dL (ref 31.5–35.8)
MCV: 102 fL — ABNORMAL HIGH (ref 78.0–96.0)
MPV: 10.9 fL (ref 8.9–12.5)
Monocytes Absolute Automated: 0.96 10*3/uL — ABNORMAL HIGH (ref 0.21–0.85)
Monocytes: 12.7 %
Neutrophils Absolute: 4.54 10*3/uL (ref 1.10–6.33)
Neutrophils: 60 %
Nucleated RBC: 0 /100 WBC (ref 0.0–0.0)
Platelets: 171 10*3/uL (ref 142–346)
RBC: 3.52 10*6/uL — ABNORMAL LOW (ref 3.90–5.10)
RDW: 14 % (ref 11–15)
WBC: 7.56 10*3/uL (ref 3.10–9.50)

## 2020-12-27 LAB — COMPREHENSIVE METABOLIC PANEL
ALT: 16 U/L (ref 0–55)
AST (SGOT): 21 U/L (ref 5–34)
Albumin/Globulin Ratio: 1 (ref 0.9–2.2)
Albumin: 3.8 g/dL (ref 3.5–5.0)
Alkaline Phosphatase: 126 U/L — ABNORMAL HIGH (ref 37–117)
Anion Gap: 15 (ref 5.0–15.0)
BUN: 29 mg/dL — ABNORMAL HIGH (ref 7.0–19.0)
Bilirubin, Total: 0.6 mg/dL (ref 0.2–1.2)
CO2: 32 mEq/L — ABNORMAL HIGH (ref 21–29)
Calcium: 9.6 mg/dL (ref 7.9–10.2)
Chloride: 90 mEq/L — ABNORMAL LOW (ref 100–111)
Creatinine: 7.9 mg/dL — ABNORMAL HIGH (ref 0.4–1.5)
Globulin: 3.8 g/dL — ABNORMAL HIGH (ref 2.0–3.7)
Glucose: 190 mg/dL — ABNORMAL HIGH (ref 70–100)
Potassium: 4.9 mEq/L (ref 3.5–5.1)
Protein, Total: 7.6 g/dL (ref 6.0–8.3)
Sodium: 137 mEq/L (ref 136–145)

## 2020-12-27 LAB — LIPID PANEL
Cholesterol / HDL Ratio: 2.5
Cholesterol: 141 mg/dL (ref 0–199)
HDL: 57 mg/dL (ref 40–9999)
LDL Calculated: 66 mg/dL (ref 0–99)
Triglycerides: 91 mg/dL (ref 34–149)
VLDL Calculated: 18 mg/dL (ref 10–40)

## 2020-12-27 LAB — HEMOGLOBIN A1C
Average Estimated Glucose: 122.6 mg/dL
Hemoglobin A1C: 5.9 % (ref 4.6–5.9)

## 2020-12-27 LAB — GFR: EGFR: 6

## 2020-12-27 LAB — T4, FREE: T4 Free: 0.74 ng/dL (ref 0.70–1.48)

## 2020-12-27 LAB — HEMOLYSIS INDEX: Hemolysis Index: 3 (ref 0–24)

## 2020-12-27 NOTE — Telephone Encounter (Signed)
error 

## 2020-12-27 NOTE — Progress Notes (Addendum)
Subjective:      Patient ID: Mary [name] is a 75 y.o. female.    Chief Complaint:  Chief Complaint   Patient presents with    Annual Exam       HPI:  75 YO Female  Presents for chronic care and follow up of multiple medical problems    Last mammogram - 2020  Last dexa scan - date unknown  Last colonoscopy - 2019 - every 10 years  Exercises - stretches  Diet - states eats healthy  Has gained 5 lbs since last visit    C/O irritation near rectal area  Steroid cream helps but symptoms never resolve  No blood in stool or abdominal pain  Skin is very dry in general    Underwent LE angiogram last month  Sees Dr Rob Bunting, has follow up this month per pt  Showed occlusion of dorsal pedalis arteries  Ulcer on foot now healed, sees Dr Richardson Landry    [facility]F- reports that tests were attempted but "unable to get it"  Advised to follow up with cardiology by the tech, but has not done this  Does get SOB, has not noticed if triggered by exertion  Notices it more during dialysis    CKD- on dialysis 3x/week  Was getting dizzy and SOB during dialysis, kidney doctor added midodrine  PTH high on labs done by nephrology    DM- BS 120s-140s  Occasional hypoglycemia if she does not eat      Problem List:  Patient Active Problem List   Diagnosis    Cellulitis of left leg    Unspecified kidney failure    IDDM (insulin dependent diabetes mellitus)    Essential (primary) hypertension    Cellulitis    Chest pain at rest    Viral pericarditis    Exertional shortness of breath    Chest pain    Pleural effusion    Chronic renal failure, stage 5    Acute calculous cholecystitis    Acute pain of left knee    Bilateral low back pain with sciatica    Dependence on renal dialysis    Diabetic neuropathy    Dvt femoral (deep venous thrombosis)    Edema    Elevated LFTs    Hyperkalemia    Hyperlipidemia    Hypertensive renal disease    Neuropathy    Primary localized osteoarthritis    Spondylosis of lumbosacral region without myelopathy  or radiculopathy    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    Congestive heart failure    Pure hypercholesterolemia, unspecified    Disease of pericardium    Long-term insulin use    PVD (peripheral vascular disease)    Cellulitis and abscess of toe of left foot    Clostridioides difficile diarrhea    Failure to thrive in adult    Pain and swelling of toe of left foot       Current Medications:  Current Outpatient Medications   Medication Sig Dispense Refill    Accu-Chek FastClix Lancets Misc USE TO CHECK BLOOD SUGAR THREE TIMES DAILY 300 each 3    Accu-Chek Guide test strip U TID      acetaminophen (TYLENOL) 500 MG tablet Take 1 tablet (500 mg total) by mouth every 12 (twelve) hours as needed. (Patient taking differently: Take 1,000 mg by mouth every 12 (twelve) hours as needed   ) 30 tablet 0    aspirin 81 MG tablet Take 81 mg by  mouth daily.        atorvastatin (LIPITOR) 20 MG tablet Take 1 tablet (20 mg total) by mouth daily 90 tablet 1    B Complex-C-Folic Acid (Rena-Vite) Tab Take 1 tablet by mouth daily      BD Pen Needle Nano 2nd Gen 32G X 4 MM Misc USE WITH INSULIN PEN TID      gabapentin (NEURONTIN) 300 MG capsule TAKE 2 CAPSULES(600 MG) BY MOUTH THREE TIMES DAILY 540 capsule 0    indomethacin (INDOCIN) 50 MG capsule Take 50 mg by mouth 2 (two) times daily with meals      Levemir FlexTouch 100 UNIT/ML injection pen INJECT 10 UNITS INTO THE SKIN EVERY EVENING 9 mL 1    Magnesium Hydroxide (DULCOLAX PO) Take by mouth as needed      midodrine (PROAMATINE) 10 MG tablet Take 10 mg by mouth as needed for low blood pressure Takes med prior to dialysis      Multiple Vitamin (MULTIVITAMIN PO) Take 1 tablet by mouth daily         NovoLOG FlexPen 100 UNIT/ML injection pen ADMINISTER 5 UNITS UNDER THE SKIN THREE TIMES DAILY BEFORE MEALS 6 mL 0    nystatin (MYCOSTATIN) cream Apply topically 2 (two) times daily 60 g 2    SITagliptin (JANUVIA) 25 MG tablet Take 25 mg by  mouth every morning      triamcinolone (KENALOG) 0.1 % cream Apply twice daily 80 g 1    vitamin D (CHOLECALCIFEROL) 25 MCG (1000 UT) tablet Take 1,000 Units by mouth daily       No current facility-administered medications for this visit.       Allergies:  Allergies   Allergen Reactions    Penicillins Hives     Onset date: 11-24-2011    Shrimp [Shellfish Allergy] Swelling     Also allergic to crab       Past Medical History:  Past Medical History:   Diagnosis Date    Abnormal vision     glasses    Arthritis     Cataracts, bilateral     Deep venous thrombosis of distal lower extremity ~2013    left leg    End-stage renal disease 2013    Hemodialysis MWF- [facility]    GERD (gastroesophageal reflux disease)     Hyperlipidemia     Hypertension     no meds    Type 2 diabetes mellitus, controlled     IDDm/ oral med. FBS ~ 119-120        Past Surgical History:  Past Surgical History:   Procedure Laterality Date    AV FISTULA PLACEMENT  2012    CHOLECYSTECTOMY      COLONOSCOPY  05/22/2018    Results: normal. Reported by Patient.    FISTULOGRAM Left 05/14/2020    Procedure: FISTULOGRAM WITH BALLOON ANGIOPLASTY;  Surgeon: [name], MD;  Location: [facility];  Service: Vascular;  Laterality: Left;    HYSTERECTOMY      INSERTION [place] Left 07/07/2019    REVISION, A-V FISTULA UPPER EXTREMITY Left 07/21/2019    Procedure: REVISION, A-V FISTULA UPPER EXTREMITY, WITH PATCH ANGIOPLASTY;  Surgeon: [name], MD;  Location: [facility];  Service: Vascular;  Laterality: Left;    REVISION, A-V FISTULA UPPER EXTREMITY Left 05/14/2020    Procedure: REVISION, A-V FISTULA UPPER EXTREMITY WITH PATCH ANGIOPLASTY;  Surgeon: [name], MD;  Location: [facility];  Service: Vascular;  Laterality: Left;    THROMBECTOMY Left 07/21/2019    Procedure: THROMBECTOMY;  Surgeon: [name], MD;  Location: [facility];  Service: Vascular;  Laterality: Left;        Family History:  Family History   Problem Relation Age of Onset    Diabetes Mother     Cancer Father         throat       Social History:  Social History     Socioeconomic History    Marital status: Widowed   Tobacco Use    Smoking status: Never Smoker    Smokeless tobacco: Never Used   Vaping Use    Vaping Use: Never used   Substance and Sexual Activity    Alcohol use: No    Drug use: Never       The following sections were reviewed this encounter by the provider:   Tobacco   Allergies   Meds   Problems   Med Hx   Surg Hx   Fam Hx          ROS:  Review of Systems   Constitutional: Positive for fatigue. Negative for appetite change and fever.   HENT: Negative for trouble swallowing and voice change.    Respiratory: Negative for cough and shortness of breath.    Cardiovascular: Negative for chest pain, palpitations and leg swelling.   Gastrointestinal: Negative for abdominal pain, blood in stool, constipation, diarrhea, nausea and vomiting.        Rectal itching   Genitourinary:        Makes small amount of urine   Musculoskeletal: Positive for arthralgias.   Neurological: Positive for dizziness (after dialysis, nephrology added midodrine) and numbness (neuropathy stable). Negative for weakness and headaches.   Psychiatric/Behavioral: Positive for confusion. Negative for dysphoric mood. The patient is not nervous/anxious.        Vitals:  BP 119/64    Pulse 85    Temp 98.6 F (37 C)    Ht 1.575 m (5\' 2" )    Wt 74.6 kg (164 lb 6.4 oz)    SpO2 97%    BMI 30.07 kg/m      Objective:     Physical Exam:  Physical Exam  Constitutional:       General: She is not in acute distress.     Appearance: She is not ill-appearing.      Comments: Poor historian   Eyes:      General: No scleral icterus.     Extraocular Movements: Extraocular movements intact.   Cardiovascular:      Rate and Rhythm: Normal rate and regular rhythm.      Heart sounds: No murmur heard.       Comments: No edema  Pulmonary:      Effort:  Pulmonary effort is normal. No respiratory distress.      Breath sounds: Normal breath sounds. No stridor. No wheezing or rhonchi.   Abdominal:      General: There is no distension.      Palpations: There is no mass.      Tenderness: There is no abdominal tenderness.   Genitourinary:     Rectum: Normal.      Comments: Skin near rectum normal, no erythema or purulence; no visible lesions; no fissures  Musculoskeletal:      Cervical back: Neck supple. No rigidity.      Right lower leg: No edema.      Left lower leg:  No edema.      Comments: Diminished pulses in L foot; ulcer on middle toe has healed; no purulence; no swelling or erythema   Skin:     Comments: Skin very dry; no erythema or purulence   Neurological:      Mental Status: She is oriented to person, place, and time.      Cranial Nerves: No cranial nerve deficit.      Sensory: Sensory deficit (diminshed over both feet) present.      Gait: Gait abnormal.      Comments: Comprehension impaired  Speech normal   Psychiatric:         Mood and Affect: Mood normal.         Thought Content: Thought content normal.         Cognition and Memory: Cognition is impaired. Memory is impaired.          Assessment:     1. Encounter for well adult exam with abnormal findings    2. Diabetic polyneuropathy associated with type 2 diabetes mellitus  - Comprehensive metabolic panel  - Hemoglobin A1C  - Lipid panel  - CBC and differential  - TSH  - T4, free    3. Type 2 diabetes mellitus with diabetic nephropathy, with long-term current use of insulin    4. Dependence on renal dialysis    5. Chronic congestive heart failure, unspecified heart failure type    6. Peripheral arterial disease    7. Hypertensive renal disease  - Dxa Bone Density Axial Skeleton; Future    8. Chronic renal failure, stage 5  - Dxa Bone Density Axial Skeleton; Future    9. Postmenopausal  - Dxa Bone Density Axial Skeleton; Future    10. Thrombocytopenia  - CBC and differential    11. Encounter for screening  mammogram for breast cancer  - Mammo Screening w/[facility]o Bilateral; Future    12. Hyperparathyroidism      Plan:   Mammogram ordered  dexa ordered  Colonoscopy up to date  Check labs  Follow up with cardiology and vascular  Has advanced care plan per pt  Shingrix recommended, advised to get this through pharmacy  Pt reports that she will get covid booster through dialysis center    Ferol Luz, MD

## 2020-12-28 ENCOUNTER — Encounter (INDEPENDENT_AMBULATORY_CARE_PROVIDER_SITE_OTHER): Payer: Self-pay | Admitting: Student in an Organized Health Care Education/Training Program

## 2020-12-29 ENCOUNTER — Encounter (INDEPENDENT_AMBULATORY_CARE_PROVIDER_SITE_OTHER): Payer: Self-pay

## 2020-12-30 ENCOUNTER — Encounter (INDEPENDENT_AMBULATORY_CARE_PROVIDER_SITE_OTHER): Payer: Self-pay

## 2020-12-31 ENCOUNTER — Encounter (INDEPENDENT_AMBULATORY_CARE_PROVIDER_SITE_OTHER): Payer: Self-pay | Admitting: Student in an Organized Health Care Education/Training Program

## 2021-01-02 ENCOUNTER — Other Ambulatory Visit (INDEPENDENT_AMBULATORY_CARE_PROVIDER_SITE_OTHER): Payer: Self-pay | Admitting: Student in an Organized Health Care Education/Training Program

## 2021-01-02 DIAGNOSIS — E1142 Type 2 diabetes mellitus with diabetic polyneuropathy: Secondary | ICD-10-CM

## 2021-01-03 NOTE — Addendum Note (Signed)
Addended by: Ferol Luz. on: 01/03/2021 11:26 AM     Modules accepted: Level of Service

## 2021-01-09 ENCOUNTER — Encounter (INDEPENDENT_AMBULATORY_CARE_PROVIDER_SITE_OTHER): Payer: Self-pay

## 2021-01-09 NOTE — Progress Notes (Signed)
Room:         Appt Date/Time: 1/21 230pm    75 y.o. female   PCP: Ferol Luz, MD  Referring Physician:     New Patient Follow-Up Post-op   [place] [facility] [place] [facility] Other Imaging     Chief Complaint/HPI: f/u- angio 11/21/20; diagnostic w/ no intervention done   Date of Last Visit:  1st f/u after angio     Allergies:   Allergies   Allergen Reactions    Penicillins Hives     Onset date: 11-24-2011    Shrimp [Shellfish Allergy] Swelling     Also allergic to crab       Selected Medications:    Coumadin Xarelto Eliquis Pradaxa Lovenox Other thinner: Zetia    Plavix Aspirin Brillinta Effient  Pletal [place] Fish Oil   Insulin Metformin Other Diabetes Atorvastatin Rosuvastatin Other Chol:       MHx:  Stroke / TIA / Seizures HTN / HLD  Diabetes   MI / CAD A-fib / Arrhythmia  COPD / Asthma / (other lung)   Kidney Disease Liver Disease DVT / Coagulopathy   Wounds Spine / other MSK  Cancer            Smoker           Former Smoker     x     Never Smoker    Vital Signs this Visit Pulses  HT  BP R BP L Temp   Rad Ulnar Brach Fem Pop DP PT        R          WT  Pulse R Pulse L SpO2       L          Imaging results:        Plan of Care:

## 2021-01-10 NOTE — Progress Notes (Signed)
CLINIC PROGRESS NOTE    VASCULAR SURGERY & ENDOVASCULAR INTERVENTION     Date: 01/11/2021  Patient Name: Mary [name]  Attending Physician: Jettie Booze, MD, FACS, RPVI  Chief Complaint: Follow-up (angio 11/21/20)      History of Present Illness:   Mary [name] is a 75 y.o. female who presents to the clinic today for follow up s/p LLE angiogram 11/21/20 by Dr. Rob Bunting.  No intervention was performed. She was noted to have occluded dorsalis pedis artery and small vessel pedal disease.  She denies any post angiogram complications.  She denies issues involving the puncture site.  She remains on aspirin and atorvastatin therapy.    Pt was initially seen for evaluation regarding toe discoloration.  Pt referred by her podiatrist, Dr. Richardson Landry.  Pt said she had a black toe and now that's resolved as skin has come off.  CTA was done revealing small arteries and plaque in the arteries.  She had 3 vessel runoff on CTA, albeit a limited study.  Patient reports that her toe discoloration has gone on to completely resolve.    She had an AVF in the LUE done by Dr. Lutricia Feil.  Denies rest pain.      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 FBS ~ 119-120        Past Surgical History:     Past Surgical History:   Procedure Laterality Date    AV FISTULA PLACEMENT  2012    CHOLECYSTECTOMY      COLONOSCOPY  05/22/2018    Results:  normal. Reported by Patient.    FISTULOGRAM Left 05/14/2020    Procedure: FISTULOGRAM WITH BALLOON ANGIOPLASTY;  Surgeon: [name], MD;  Location: [facility];  Service: Vascular;  Laterality: Left;    HYSTERECTOMY      INSERTION [place] Left 07/07/2019    REVISION, A-V FISTULA UPPER EXTREMITY Left 07/21/2019    Procedure: REVISION, A-V FISTULA UPPER EXTREMITY, WITH PATCH ANGIOPLASTY;  Surgeon: [name], MD;  Location: [facility];  Service: Vascular;  Laterality: Left;    REVISION, A-V FISTULA UPPER EXTREMITY Left 05/14/2020    Procedure: REVISION, A-V FISTULA UPPER EXTREMITY WITH PATCH ANGIOPLASTY;  Surgeon: [name], MD;  Location: [facility];  Service: Vascular;  Laterality: Left;    THROMBECTOMY Left 07/21/2019    Procedure: THROMBECTOMY;  Surgeon: [name], MD;  Location: [name];  Service: Vascular;  Laterality: Left;       Family History:     Family History   Problem Relation Age of Onset    Diabetes Mother     Cancer Father  throat       Social History:     Social History     Tobacco Use    Smoking status: Never Smoker    Smokeless tobacco: Never Used   Brewing technologist Use: Never used   Substance Use Topics    Alcohol use: No    Drug use: Never       Allergies:     Allergies   Allergen Reactions    Penicillins Hives     Onset date: 11-24-2011    Shrimp [Shellfish Allergy] Swelling     Also allergic to crab       Medications:     Current Outpatient Medications:     Accu-Chek FastClix Lancets Misc, USE TO CHECK BLOOD SUGAR THREE TIMES DAILY, Disp: 300 each, Rfl: 3    Accu-Chek Guide test strip, U TID, Disp: , Rfl:     acetaminophen (TYLENOL) 500 MG tablet, Take 1 tablet (500 mg total) by mouth every 12 (twelve) hours as needed. (Patient taking differently: Take 1,000 mg by mouth every 12 (twelve) hours as needed  ), Disp: 30 tablet, Rfl: 0    aspirin 81 MG tablet, Take 81 mg by mouth daily.  , Disp: , Rfl:      atorvastatin (LIPITOR) 20 MG tablet, Take 1 tablet (20 mg total) by mouth daily, Disp: 90 tablet, Rfl: 1    B Complex-C-Folic Acid (Rena-Vite) Tab, Take 1 tablet by mouth daily, Disp: , Rfl:     BD Pen Needle Nano 2nd Gen 32G X 4 MM Misc, USE WITH INSULIN PEN TID, Disp: , Rfl:     gabapentin (NEURONTIN) 300 MG capsule, TAKE 2 CAPSULES(600 MG) BY MOUTH THREE TIMES DAILY, Disp: 540 capsule, Rfl: 0    indomethacin (INDOCIN) 50 MG capsule, Take 50 mg by mouth 2 (two) times daily with meals, Disp: , Rfl:     Levemir FlexTouch 100 UNIT/ML injection pen, INJECT 10 UNITS INTO THE SKIN EVERY EVENING, Disp: 9 mL, Rfl: 1    Magnesium Hydroxide (DULCOLAX PO), Take by mouth as needed, Disp: , Rfl:     midodrine (PROAMATINE) 10 MG tablet, Take 10 mg by mouth as needed for low blood pressure Takes med prior to dialysis, Disp: , Rfl:     Multiple Vitamin (MULTIVITAMIN PO), Take 1 tablet by mouth daily  , Disp: , Rfl:     NovoLOG FlexPen 100 UNIT/ML injection pen, ADMINISTER 5 UNITS UNDER THE SKIN THREE TIMES DAILY BEFORE MEALS, Disp: 6 mL, Rfl: 2    nystatin (MYCOSTATIN) cream, Apply topically 2 (two) times daily, Disp: 60 g, Rfl: 2    SITagliptin (JANUVIA) 25 MG tablet, Take 25 mg by mouth every morning, Disp: , Rfl:     triamcinolone (KENALOG) 0.1 % cream, Apply twice daily, Disp: 80 g, Rfl: 1    vitamin D (CHOLECALCIFEROL) 25 MCG (1000 UT) tablet, Take 1,000 Units by mouth daily, Disp: , Rfl:     Review of Systems:   General: negative for - chills, fever, night sweats  Psychological: negative for - depression, amnesia, suicidal ideation  Ophthalmic: negative for - blurry vision, double vision, blindness  ENT: negative for - epistaxis, deafness, tinnitus, vertigo, dysphagia  Allergy and Immunology: negative for - hives  Hematological and Lymphatic:  negative for - bleeding problems, blood clots, jaundice, swollen lymph nodes  Endocrine:  negative for - palpitations, polydipsia/polyuria  Respiratory: negative for  - cough, hemoptysis, shortness of breath, wheezing  Cardiovascular: negative  for - chest pain, irregular heartbeat, palpitations  Gastrointestinal: negative for - abdominal pain, early satiety, blood in stools, constipation, diarrhea, hematemesis, melena, nausea/vomiting  Genito-Urinary: negative for - dysuria, hematuria  Musculoskeletal: negative for - joint pain, joint swelling, muscle pain or muscular weakness  Neurological: negative for - TIA or stroke symptoms, dizziness, headaches, memory loss, seizures, paralysis  Dermatological:  negative for lumps, pruritis or rash      Physical Exam:     Vitals:    01/11/21 1623   BP: 184/71   BP Site: Right arm   Patient Position: Sitting   Cuff Size: Medium   Pulse: 75   Temp: 97.9 F (36.6 C)   TempSrc: Temporal   SpO2: 93%   Weight: 73.5 kg (162 lb)   Height: 1.6 m ('5\' 3"'$ )       Body mass index is 28.7 kg/m.    HEENT:  N/C, AT, CN 2-12 intact, no LAD, no TMG, O/P clear, no carotid bruits  CARDIAC:  S1, S2, RRR, no M/G/R  CHEST:  CTA  ABD:  S/NT/ND, no pulsatile abdominal masses, no tenderness, no guarding, no hernias, normal BS, no abdominal or femoral bruits   EXTR:  No C/C/E, L 2nd toe with denuded skin, no varicose veins  PULSES:    R: 2+ radial, ulnar, brachial, carotid, femoral, 0 popliteal, DP, PT  L:  1+ DP, PT    Labs:   None    Rads:   Per HPI    Problem List:     Patient Active Problem List   Diagnosis    Unspecified kidney failure    IDDM (insulin dependent diabetes mellitus)    Essential (primary) hypertension    Chest pain at rest    Viral pericarditis    Exertional shortness of breath    Chest pain    Pleural effusion    Chronic renal failure, stage 5    Acute calculous cholecystitis    Acute pain of left knee    Bilateral low back pain with sciatica    Dependence on renal dialysis    Diabetic neuropathy    Dvt femoral (deep venous thrombosis)    Edema    Elevated LFTs    Hyperkalemia    Hyperlipidemia    Hypertensive renal disease     Neuropathy    Primary localized osteoarthritis    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 female with     1. PVD (peripheral vascular disease)      -The patient is status post left lower extremity diagnostic angiogram.  No intervention was performed.  Angiogram demonstrated occlusion of the dorsalis pedis artery and small vessel disease in the left foot.  -Left foot discoloration has resolved.  Currently no ulcers of the feet or legs.  She currently denies symptoms of rest pain or claudication.  -Patient is currently taking aspirin and atorvastatin daily.    Plan:   - The patient may follow up with our office on an as needed basis with regard to PAD.  Recommend vascular consultation prior to any elective foot surgery given known small vessel disease in the left foot.   -Continue  antiplatelet and statin therapy for medical management of peripheral arterial disease.    Thank you for allowing me the privilege of participating in the care of your patient.  Please call me if you have any questions.      Sincerely,    Arlone Lenhardt Thyra Breed, MD, FACS, [place]  Vascular Surgery, Endovascular Intervention & Vascular Medicine    [facility]  Assistant Professor of Vascular Surgery   [place]  Tel: [phone] / Fax: [phone]    Mary Azure, FNP  [facility]   [address] [place]  [place], [place] [address]  T  [phone]    F  [phone]    This note was generated by the Epic EMR system/ Dragon speech recognition and may contain inherent errors or omissions not intended by the user. Grammatical errors, random word insertions, deletions, pronoun errors and  incomplete sentences are occasional consequences of this technology due to software limitations. Not all errors are caught or corrected. If there are questions or concerns about the content of this note or information contained within the body of this dictation they should be addressed directly with the author for clarification.

## 2021-01-11 ENCOUNTER — Telehealth (INDEPENDENT_AMBULATORY_CARE_PROVIDER_SITE_OTHER): Payer: Self-pay | Admitting: Specialist

## 2021-01-11 ENCOUNTER — Ambulatory Visit (INDEPENDENT_AMBULATORY_CARE_PROVIDER_SITE_OTHER): Payer: Medicare Other | Admitting: Specialist

## 2021-01-11 ENCOUNTER — Other Ambulatory Visit (INDEPENDENT_AMBULATORY_CARE_PROVIDER_SITE_OTHER): Payer: Medicare Other

## 2021-01-11 ENCOUNTER — Encounter (INDEPENDENT_AMBULATORY_CARE_PROVIDER_SITE_OTHER): Payer: Self-pay | Admitting: Specialist

## 2021-01-11 VITALS — BP 184/71 | HR 75 | Temp 97.9°F | Ht 63.0 in | Wt 162.0 lb

## 2021-01-11 DIAGNOSIS — I739 Peripheral vascular disease, unspecified: Secondary | ICD-10-CM

## 2021-01-11 NOTE — Telephone Encounter (Signed)
LF VM TO CALL BACK TO  RESCHEDULE SINCE PT WAS A NO CALL NO SHOW TODAY, FP

## 2021-01-14 ENCOUNTER — Encounter (INDEPENDENT_AMBULATORY_CARE_PROVIDER_SITE_OTHER): Payer: Self-pay

## 2021-01-14 ENCOUNTER — Telehealth (INDEPENDENT_AMBULATORY_CARE_PROVIDER_SITE_OTHER): Payer: Self-pay | Admitting: Student in an Organized Health Care Education/Training Program

## 2021-01-14 MED ORDER — NYSTATIN 100000 UNIT/GM EX CREA
TOPICAL_CREAM | Freq: Two times a day (BID) | CUTANEOUS | 0 refills | Status: DC
Start: 2021-01-14 — End: 2021-04-25

## 2021-01-14 NOTE — Telephone Encounter (Signed)
Refill Request:    Last Office Visit (Less than 6 months): 12-27-2020  (If greater than 6 months- NEEDS OV SCHEDULED)  Scheduled or declined?    Reason for declining OV0  Prescriber: White  Medication:   Name:Nystatin cream   Dose: 0   Directions: Apply twice a day  How many days left of meds: 0  Pharmacy: [facility] [address]  Comments: 0

## 2021-01-22 ENCOUNTER — Telehealth (INDEPENDENT_AMBULATORY_CARE_PROVIDER_SITE_OTHER): Payer: Self-pay | Admitting: Student in an Organized Health Care Education/Training Program

## 2021-01-22 NOTE — Telephone Encounter (Signed)
Patient called requesting to speak with clinical staff regarding "Itchy fingers" offered pt an appointment, pt stated she doesn't need one and only wants to speak with clinical staff. Please advise    Ph: [phone]

## 2021-01-22 NOTE — Telephone Encounter (Signed)
Name of Requester: patinet  Clinical Question: patient has itchy finger tips on all fingers on left hand sxs started this morning. Patient has put on lotion, rinsed, and washed them itch is presistant.   Last DOS: 01/11/2021  Requesters preferred ph: [phone]  Please note: please advise

## 2021-01-23 NOTE — Telephone Encounter (Signed)
Call attempted. No answer. Mailbox is full.

## 2021-01-23 NOTE — Telephone Encounter (Signed)
Patient was on the phone with clinical, states phone got disconnected. Requesting call back.

## 2021-01-23 NOTE — Telephone Encounter (Signed)
Left message for patient to return call.

## 2021-01-23 NOTE — Telephone Encounter (Signed)
Spoke with pt who states she went to [facility] ER yesterday, Pt states she was prescribed Cymbalta for 7 days. Advised pt to schedule a follow up ER. Pt refused stating she will wait until she finish her medication

## 2021-01-28 ENCOUNTER — Other Ambulatory Visit (INDEPENDENT_AMBULATORY_CARE_PROVIDER_SITE_OTHER): Payer: Self-pay

## 2021-01-28 DIAGNOSIS — N185 Chronic kidney disease, stage 5: Secondary | ICD-10-CM

## 2021-01-29 ENCOUNTER — Encounter (INDEPENDENT_AMBULATORY_CARE_PROVIDER_SITE_OTHER): Payer: Self-pay

## 2021-01-29 ENCOUNTER — Other Ambulatory Visit (INDEPENDENT_AMBULATORY_CARE_PROVIDER_SITE_OTHER): Payer: Medicare Other

## 2021-01-29 ENCOUNTER — Telehealth (INDEPENDENT_AMBULATORY_CARE_PROVIDER_SITE_OTHER): Payer: Self-pay | Admitting: Specialist

## 2021-01-29 NOTE — Telephone Encounter (Signed)
Called to have the Pt reschedule due [place] to tech being sick- possible reschedule [place] and Dr. Arta Bruce

## 2021-01-30 ENCOUNTER — Encounter (INDEPENDENT_AMBULATORY_CARE_PROVIDER_SITE_OTHER): Payer: Self-pay

## 2021-01-30 ENCOUNTER — Ambulatory Visit (INDEPENDENT_AMBULATORY_CARE_PROVIDER_SITE_OTHER): Payer: Medicare Other

## 2021-01-30 ENCOUNTER — Ambulatory Visit (INDEPENDENT_AMBULATORY_CARE_PROVIDER_SITE_OTHER): Payer: Medicare Other | Admitting: Specialist

## 2021-01-30 ENCOUNTER — Encounter (INDEPENDENT_AMBULATORY_CARE_PROVIDER_SITE_OTHER): Payer: Self-pay | Admitting: Specialist

## 2021-01-30 VITALS — BP 136/70 | HR 74 | Temp 97.2°F | Ht 63.0 in | Wt 179.0 lb

## 2021-01-30 DIAGNOSIS — N185 Chronic kidney disease, stage 5: Secondary | ICD-10-CM

## 2021-01-30 NOTE — Progress Notes (Signed)
[facility]         Chief Complaint   Patient presents with    Follow-up     c/o BLE circulation issues          History of Present Illness     Mary [name] is a 75 y.o. female who presents  for routine follow-up of left upper arm cephalic vein transposition.  Patient complains of excess bleeding.  Patient has undergone follow-up duplex ultrasound examination.    Past Medical History     Past Medical History        Past Medical History:   Diagnosis Date    Abnormal vision     glasses    Arthritis     Cataracts, bilateral     Deep venous thrombosis of distal lower extremity ~2013    left leg    End-stage renal disease 2013    Hemodialysis MWF- [facility]    GERD (gastroesophageal reflux disease)     Hyperlipidemia     Hypertension     no meds    Type 2 diabetes mellitus, controlled     IDDm/ oral med. FBS ~ 119-120           Allergies           Allergies   Allergen Reactions    Penicillins Hives     Onset date: 11-24-2011    Shrimp [Shellfish Allergy] Swelling     Also allergic to crab       Medications            Current Outpatient Medications on File Prior to Visit   Medication Sig Dispense Refill    Accu-Chek FastClix Lancets Misc USE TO CHECK BLOOD SUGARS THREE TIMES DAILY 300 each 3    Accu-Chek Guide test strip U TID      acetaminophen (TYLENOL) 500 MG tablet Take 1 tablet (500 mg total) by mouth every 12 (twelve) hours as needed. (Patient taking differently: Take 1,000 mg by mouth every 12 (twelve) hours as needed   ) 30 tablet 0    aspirin 81 MG tablet Take 81 mg by mouth daily.        atorvastatin (LIPITOR) 10 MG tablet TAKE 1 TABLET(10 MG) BY MOUTH EVERY MORNING (Patient taking differently: 20 mg   ) 90 tablet 0    BD Pen Needle Nano 2nd Gen 32G X 4 MM Misc USE WITH INSULIN PEN TID      calcium acetate (PHOSLO) 667 MG capsule TAKE 1 CAPSULE(667 MG) BY MOUTH THREE TIMES DAILY WITH MEALS 90 capsule 0    Colchicine 0.6 MG Cap TK 1 C PO  BID PRN FOR GOUT      Febuxostat (ULORIC) 40 MG tablet Take 40 mg by mouth every morning         gabapentin (NEURONTIN) 300 MG capsule TAKE 2 CAPSULES(600 MG) BY MOUTH THREE TIMES DAILY 540 capsule 0    indomethacin (INDOCIN) 50 MG capsule Take 50 mg by mouth 2 (two) times daily with meals      insulin aspart (NovoLOG FlexPen) 100 UNIT/ML injection pen Inject 5 Units into the skin 3 (three) times daily before meals 2 pen 3    insulin lispro (HumaLOG) 100 UNIT/ML injection Inject 5 Units into the skin 3 (three) times daily before meals      Levemir FlexTouch 100 UNIT/ML injection pen INJECT 10 UNITS INTO THE SKIN EVERY EVENING 9 mL 1    Magnesium Hydroxide (DULCOLAX PO)  Take by mouth as needed      Multiple Vitamin (MULTIVITAMIN PO) Take 1 tablet by mouth daily         nystatin (MYCOSTATIN) ointment APPLY TWICE DAILY X 10 DAYS 30 g 0    SITagliptin (JANUVIA) 25 MG tablet Take 25 mg by mouth every morning      triamcinolone (KENALOG) 0.1 % cream Apply twice daily 80 g 1    vitamin D (CHOLECALCIFEROL) 25 MCG (1000 UT) tablet Take 1,000 Units by mouth daily       No current facility-administered medications on file prior to visit.       Review of Systems     Constitutional: Negative for fevers and chills  Skin: No rash or lesions  Respiratory: Negative for cough, wheezing, or hemoptysis  Cardiovascular: as per HPI  Gastrointestinal: Negative for abdominal pain, nausea, vomiting and diarrhea  Musculoskeletal:  No arthritic symptoms  Genitourinary: Negative for dysuria  All other systems were reviewed and are negative except what is stated in the HPI    Physical Exam     Vitals       Vitals:    07/04/20 1251   BP: 118/61   Pulse: 71   Temp: 97.4 F (36.3 C)   SpO2: 97%          Body mass index is 30.22 kg/m.    General: Patient appears their stated age, well-nourished. Alert and in no apparent distress.  Lungs: Respiratory effort unlabored, chest expansion symmetric.  Cardiac:  RRR,  no JVD.   Extremities warm, Right Femoral pulses 2+, Left Femoral 2+,  Abd: Soft, nondistended, nontender.   [id number] ROM in all 4 extremities, symmetric   Skin: Color appropriate for race, Skin warm, dry, no gangrene, no non healing ulcers, no varicose veins , no hyperpigmentation, no lipo-dermatosclerosis  Neuro: Good insight and judgment, oriented to person, place, and time CN II-XII intact, gross motor and sensory intact      Labs     CBC:   WBC   Date/Time Value Ref Range Status   11/11/2013 05:13 PM 6.19 3.5 - 10.8 Final           RBC   Date/Time Value Ref Range Status   11/11/2013 05:13 PM 3.86 (L) 4.20 - 5.40 Final           Hgb   Date/Time Value Ref Range Status   11/11/2013 05:13 PM 11.5 (L) 12.0 - 16.0 g/dL Final           Hematocrit   Date/Time Value Ref Range Status   11/11/2013 05:13 PM 34.8 (L) 37.0 - 47.0 % Final           MCV   Date/Time Value Ref Range Status   11/11/2013 05:13 PM 90.2 80.0 - 100.0 fL Final           MCHC   Date/Time Value Ref Range Status   11/11/2013 05:13 PM 33.0 32 - 36 g/dL Final           RDW   Date/Time Value Ref Range Status   11/11/2013 05:13 PM 17 (H) 12.0 - 15.0 % Final           Platelets   Date/Time Value Ref Range Status   11/11/2013 05:13 PM 183 140 - 400 Final       CMP:         Sodium   Date/Time Value Ref Range Status   02/20/2014 04:37 PM 141 136 -  145 mEq/L Final           Potassium   Date/Time Value Ref Range Status   02/20/2014 04:37 PM 4.5 3.5 - 5.1 mEq/L Final           Chloride   Date/Time Value Ref Range Status   02/20/2014 04:37 PM 100 98 - 107 mEq/L Final           CO2   Date/Time Value Ref Range Status   02/20/2014 04:37 PM 23 22 - 29 mEq/L Final             Glucose   Date/Time Value Ref Range Status   02/20/2014 04:37 PM 194 (H) 70 - 100 mg/dL Final     Comment:     Specimen moderately lipemic. ER  Interpretive Data for Adult Female and Female Population  Indeterminate Range:  100-125 mg/dL  Equal to or greater than 126 mg/dL  meets the ADA  guidelines for Diabetes Mellitus diagnosis if symptoms  are present and confirmed by repeat testing.  Random (Non-Fasting)Interpretive Data (Adults):  Equal to or greater than 200 mg/dL meets the ADA  guidelines for Diabetes Mellitus diagnosis if symptoms  are present and confirmed by Fasting Glucose or GTT.           BUN   Date/Time Value Ref Range Status   02/20/2014 04:37 PM 75 (H) 7 - 19 mg/dL Final           Protein, Total   Date/Time Value Ref Range Status   06/12/2020 02:44 PM 7.8 6.0 - 8.3 g/dL Final           Alkaline Phosphatase   Date/Time Value Ref Range Status   06/12/2020 02:44 PM 126 (H) 37 - 106 U/L Final           AST (SGOT)   Date/Time Value Ref Range Status   06/12/2020 02:44 PM 44 (H) 5 - 34 U/L Final           ALT   Date/Time Value Ref Range Status   06/12/2020 02:44 PM 23 0 - 55 U/L Final           Anion Gap   Date/Time Value Ref Range Status   02/20/2014 04:37 PM 18.0 (H) 5 - 15 Final       Lipid Panel         Cholesterol   Date/Time Value Ref Range Status   06/12/2020 02:44 PM 134 0 - 199 mg/dL Final           Triglycerides   Date/Time Value Ref Range Status   06/12/2020 02:44 PM 151 (H) 34 - 149 mg/dL Final             HDL   Date/Time Value Ref Range Status   06/12/2020 02:44 PM 41 40 - 9,999 mg/dL Final     Comment:     An HDL cholesterol <40 mg/dL is low and constitutes a  coronary heart disease risk factor, and HDL-C>59 mg/dL is  a negative risk factor for CHD.  Ref: American Heart Association; Circulation 2004         Coags:         PT   Date/Time Value Ref Range Status   02/20/2014 04:37 PM 25.5 (H) 12.6 - 15.0 sec Final             PT INR   Date/Time Value Ref Range Status   02/20/2014 04:37 PM 2.4 (H) 0.9 - 1.1  Final     Comment:     Recommended Ranges for Protime INR:    2.0-3.0 for most medical and surgical thromboembolic states    [id number] for artificial heart valves  INR result may not represent exact Warfarin dosing level during  the transition  period from Heparin to Warfarin therapy.  Recommend close clinical monitoring.           PTT   Date/Time Value Ref Range Status   11/11/2013 05:13 PM 36 23 - 37 Final       Assessment and Plan       1. 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 Chronic renal failure, stage 5  US Arterial/ Graft Duplex Dopp Low Extrem Bil Comp    Ankle brachial index    [place] Hemodialysis Access Duplex Dopp       Mary [name] is a 75 y.o. female who presents  for routine follow-up of left upper arm cephalic vein transposition.  Patient complains of excess bleeding.  Patient has undergone follow-up duplex ultrasound examination.  The duplex ultrasound reveals high-grade area of stenosis at the cephalic arch proximal to the previously placed covered stent therefore because of significant pulsation I have recommended to her to undergo fistulogram with possible angioplasty. It was explained to the patient and has agreed.  The risks and benefits of the procedures were explained to the patient and agreed. The risk of infection, bleeding, thrombosis, steal syndrome, nerve injury and other complications were explained in detail and agreed.     Shary Key, MD, [place], [facility]  Chief, Section of [facility]

## 2021-02-05 ENCOUNTER — Telehealth (INDEPENDENT_AMBULATORY_CARE_PROVIDER_SITE_OTHER): Payer: Self-pay

## 2021-02-05 NOTE — Telephone Encounter (Signed)
Left message for patient to call back to schedule surgery

## 2021-02-19 ENCOUNTER — Telehealth (INDEPENDENT_AMBULATORY_CARE_PROVIDER_SITE_OTHER): Payer: Self-pay

## 2021-02-19 ENCOUNTER — Other Ambulatory Visit (INDEPENDENT_AMBULATORY_CARE_PROVIDER_SITE_OTHER): Payer: Self-pay

## 2021-02-19 DIAGNOSIS — N186 End stage renal disease: Secondary | ICD-10-CM

## 2021-02-19 NOTE — Telephone Encounter (Signed)
-----   Message from [name], MD sent at 01/30/2021  1:19 PM EST -----  Left arm fistulogram in March    Daughter [phone] carol    d

## 2021-02-19 NOTE — Telephone Encounter (Signed)
Patient scheduled for:03/11/2021    Surgeon: Shary Key, MDProcedure: Left arm fistulogram   Arrive at: 11:00 AM   Anesthesia: Choice   Location: [facility]  Special instructions: NPO   Knows to have Labs: Not Required  Need medication instructions: Yes    Mailing out packet: Will mail    Post op: 04/27    CPT Code:36901-36902  ICD Code:N18.6 ESRD  Admit Status:Outpatient      Dialysis Days:MWF

## 2021-02-19 NOTE — Telephone Encounter (Signed)
Below medication instructions forwarded to surgery scheduler, Norma, to be sent to patient with pre-op packet:      Day of Surgery Medication Instructions:  -Aspirin: continue this on your normal schedule. Do not stop it prior to, or hold it on the day of surgery.    A nurse from [facility] will call you to discuss all other medication instructions. If you do not want to wait for their phone call, you can reach them by calling [phone].    Pre-Op Testing:  -All pre-op testing tbd by FOH

## 2021-02-20 ENCOUNTER — Ambulatory Visit (INDEPENDENT_AMBULATORY_CARE_PROVIDER_SITE_OTHER): Payer: Medicare Other

## 2021-02-20 ENCOUNTER — Encounter (INDEPENDENT_AMBULATORY_CARE_PROVIDER_SITE_OTHER): Payer: Self-pay

## 2021-02-20 VITALS — BP 125/80 | HR 72 | Temp 98.9°F | Ht 63.0 in | Wt 163.0 lb

## 2021-02-20 DIAGNOSIS — Z1283 Encounter for screening for malignant neoplasm of skin: Secondary | ICD-10-CM

## 2021-02-20 DIAGNOSIS — R21 Rash and other nonspecific skin eruption: Secondary | ICD-10-CM

## 2021-02-20 MED ORDER — TRIAMCINOLONE ACETONIDE 0.1 % EX CREA
TOPICAL_CREAM | CUTANEOUS | 0 refills | Status: AC
Start: 2021-02-20 — End: ?

## 2021-02-20 NOTE — Progress Notes (Signed)
[facility]                       Date of Exam: 02/20/2021 4:31 PM        Patient ID: Mary [name] is a 75 y.o. female.  Attending Physician: Gerlene Fee, FNP     Subjective:      Chief Complaint:  Chief Complaint   Patient presents with    Rash     itchy rash under her breast       HPI:  75 y.o. year old female patient presents here today for itchy rash under her breast.    Patient states that she notice the  symptoms last Monday 02/18/2021 for the rash and it's very itchy ( right by the mole)  Patient states she tried a dove pink soap and per patient she started itching.  Patient denies using any new laundry products, ointment or cream.    Has another mole on back that is itchy  Denies ever seeing dermatologist      Problem List:  Patient Active Problem List   Diagnosis    Unspecified kidney failure    IDDM (insulin dependent diabetes mellitus)    Essential (primary) hypertension    Chest pain at rest    Viral pericarditis    Exertional shortness of breath    Chest pain    Pleural effusion    Chronic renal failure, stage 5    Acute calculous cholecystitis    Acute pain of left knee    Bilateral low back pain with sciatica    Dependence on renal dialysis    Diabetic neuropathy    Dvt femoral (deep venous thrombosis)    Edema    Elevated LFTs    Hyperkalemia    Hyperlipidemia    Hypertensive renal disease    Neuropathy    Primary localized osteoarthritis    Spondylosis of lumbosacral region without myelopathy or radiculopathy    Congestive heart failure    Pure hypercholesterolemia, unspecified    Disease of pericardium    Long-term insulin use    PVD (peripheral vascular disease)    Clostridioides difficile diarrhea    Failure to thrive in adult       Current Medications:  Current Outpatient Medications   Medication Sig Dispense Refill    Accu-Chek FastClix Lancets Misc USE TO CHECK BLOOD SUGAR THREE TIMES DAILY 300 each 3    Accu-Chek Guide  test strip U TID      acetaminophen (TYLENOL) 500 MG tablet Take 1 tablet (500 mg total) by mouth every 12 (twelve) hours as needed. (Patient taking differently: Take 1,000 mg by mouth every 12 (twelve) hours as needed   ) 30 tablet 0    aspirin 81 MG tablet Take 81 mg by mouth daily.        atorvastatin (LIPITOR) 20 MG tablet Take 1 tablet (20 mg total) by mouth daily 90 tablet 1    B Complex-C-Folic Acid (Rena-Vite) Tab Take 1 tablet by mouth daily      BD Pen Needle Nano 2nd Gen 32G X 4 MM Misc USE WITH INSULIN PEN TID      gabapentin (NEURONTIN) 300 MG capsule TAKE 2 CAPSULES(600 MG) BY MOUTH THREE TIMES DAILY 540 capsule 0    indomethacin (INDOCIN) 50 MG capsule Take 50 mg by mouth 2 (two) times daily with meals      Levemir FlexTouch 100 UNIT/ML injection pen  INJECT 10 UNITS INTO THE SKIN EVERY EVENING 9 mL 1    Magnesium Hydroxide (DULCOLAX PO) Take by mouth as needed      midodrine (PROAMATINE) 10 MG tablet Take 10 mg by mouth as needed for low blood pressure Takes med prior to dialysis      Multiple Vitamin (MULTIVITAMIN PO) Take 1 tablet by mouth daily         NovoLOG FlexPen 100 UNIT/ML injection pen ADMINISTER 5 UNITS UNDER THE SKIN THREE TIMES DAILY BEFORE MEALS 6 mL 2    nystatin (MYCOSTATIN) cream Apply topically 2 (two) times daily 60 g 0    SITagliptin (JANUVIA) 25 MG tablet Take 25 mg by mouth every morning      triamcinolone (KENALOG) 0.1 % cream Apply twice daily 80 g 0    vitamin D (CHOLECALCIFEROL) 25 MCG (1000 UT) tablet Take 1,000 Units by mouth daily       No current facility-administered medications for this visit.       Allergies:  Allergies   Allergen Reactions    Penicillins Hives     Onset date: 11-24-2011    Shrimp [Shellfish Allergy] Swelling     Also allergic to crab       Past Medical History:  Past Medical History:   Diagnosis Date    Abnormal vision     glasses    Arthritis     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 08/17/2019     Cataracts, bilateral     Cellulitis and abscess of toe of left foot 08/06/2020    Cellulitis of left leg 06/21/2013    Deep venous thrombosis of distal lower extremity ~2013    left leg    End-stage renal disease 2013    Hemodialysis MWF- [facility]    GERD (gastroesophageal reflux disease)     Hyperlipidemia     Hypertension     no meds    Pain and swelling of toe of left foot 08/07/2020    Type 2 diabetes mellitus, controlled     IDDm/ oral med. FBS ~ 119-120        Past Surgical History:  Past Surgical History:   Procedure Laterality Date    AV FISTULA PLACEMENT  2012    CHOLECYSTECTOMY      COLONOSCOPY  05/22/2018    Results: normal. Reported by Patient.    FISTULOGRAM Left 05/14/2020    Procedure: FISTULOGRAM WITH BALLOON ANGIOPLASTY;  Surgeon: [name], MD;  Location: [facility];  Service: Vascular;  Laterality: Left;    HYSTERECTOMY      INSERTION [place] Left 07/07/2019    REVISION, A-V FISTULA UPPER EXTREMITY Left 07/21/2019    Procedure: REVISION, A-V FISTULA UPPER EXTREMITY, WITH PATCH ANGIOPLASTY;  Surgeon: [name], MD;  Location: [facility];  Service: Vascular;  Laterality: Left;    REVISION, A-V FISTULA UPPER EXTREMITY Left 05/14/2020    Procedure: REVISION, A-V FISTULA UPPER EXTREMITY WITH PATCH ANGIOPLASTY;  Surgeon: [name], MD;  Location: [facility];  Service: Vascular;  Laterality: Left;    THROMBECTOMY Left 07/21/2019    Procedure: THROMBECTOMY;  Surgeon: [name], MD;  [name];  Service: Vascular;  Laterality: Left;       Family History:  Family History   Problem Relation Age of Onset    Diabetes Mother     Cancer Father  throat       Social History:  Social History     Socioeconomic History    Marital status: Widowed   Tobacco Use    Smoking status: Never Smoker    Smokeless tobacco: Never Used   Vaping Use    Vaping Use: Never used   Substance and Sexual Activity    Alcohol  use: No    Drug use: Never        The following sections were reviewed this encounter by the provider:        ROS:  Review of Systems   Constitutional: Negative for fatigue and fever.   Respiratory: Negative for cough and shortness of breath.    Cardiovascular: Negative for chest pain.   Gastrointestinal: Negative for abdominal pain.   Skin: Positive for rash.       Vitals:  BP 125/80 (BP Site: Left arm, Patient Position: Sitting, Cuff Size: Large)    Pulse 72    Temp 98.9 F (37.2 C)    Ht 1.6 m ('5\' 3"'$ )    Wt 73.9 kg (163 lb)    BMI 28.87 kg/m      Objective:     Physical Exam:  Physical Exam  Constitutional:       General: She is not in acute distress.     Appearance: Normal appearance. She is not ill-appearing.   HENT:      Head: Normocephalic.      Nose: Nose normal.   Eyes:      Extraocular Movements: Extraocular movements intact.      Conjunctiva/sclera: Conjunctivae normal.   Cardiovascular:      Rate and Rhythm: Normal rate and regular rhythm.      Pulses: Normal pulses.      Heart sounds: Normal heart sounds.   Pulmonary:      Effort: Pulmonary effort is normal.      Breath sounds: Normal breath sounds.   Musculoskeletal:         General: Normal range of motion.      Cervical back: Normal range of motion.   Skin:     General: Skin is warm and dry.   Neurological:      Mental Status: She is alert and oriented to person, place, and time.   Psychiatric:         Mood and Affect: Mood normal.         Behavior: Behavior normal.         Thought Content: Thought content normal.         Judgment: Judgment normal.          Assessment:     1. Rash  - triamcinolone (KENALOG) 0.1 % cream; Apply twice daily  Dispense: 80 g; Refill: 0    2. Skin cancer screening  - Ambulatory referral to Dermatology      Plan:   Follow up if no improvement in symptoms  Can take otc antihistamine in addition to kenalog  Refer to dermatologist for skin cancer screening due to changes in moles being itchy    Gerlene Fee, FNP

## 2021-02-26 ENCOUNTER — Other Ambulatory Visit (INDEPENDENT_AMBULATORY_CARE_PROVIDER_SITE_OTHER): Payer: Self-pay

## 2021-02-26 ENCOUNTER — Telehealth (INDEPENDENT_AMBULATORY_CARE_PROVIDER_SITE_OTHER): Payer: Self-pay | Admitting: Student in an Organized Health Care Education/Training Program

## 2021-02-26 DIAGNOSIS — E1121 Type 2 diabetes mellitus with diabetic nephropathy: Secondary | ICD-10-CM

## 2021-02-26 MED ORDER — ONETOUCH ULTRASOFT LANCETS MISC
3 refills | Status: DC
Start: 2021-02-26 — End: 2021-03-05

## 2021-02-26 MED ORDER — ONETOUCH VERIO VI STRP
ORAL_STRIP | 11 refills | Status: DC
Start: 2021-02-26 — End: 2021-03-05

## 2021-02-26 MED ORDER — ONETOUCH ULTRA 2 W/DEVICE KIT
PACK | 0 refills | Status: DC
Start: 2021-02-26 — End: 2021-03-05

## 2021-02-26 NOTE — Telephone Encounter (Signed)
Diabetic medical supply sent to [facility] on [address].as requested by patient.

## 2021-02-26 NOTE — Telephone Encounter (Signed)
Name of Requester: Self   Clinical Question: Patient Glucose meter broke needs an order for a new machine, test strips and lancets called in.    Last DOS: 02-20-2021  Requesters preferred ph: [phone]  Please note: [facility] [place]. Please call patient when called in.

## 2021-02-26 NOTE — Telephone Encounter (Signed)
Patient called stating she called this morning regarding her requested. Pt stated that her sugar is running up and down                      Name of Requester: Self   Clinical Question: Patient Glucose meter broke needs an order for a new machine, test strips and lancets called in.               Last DOS: 02-20-2021  Requesters preferred ph: [phone]  Please note: [facility] [place]. Please call patient when called in.

## 2021-02-27 ENCOUNTER — Encounter (INDEPENDENT_AMBULATORY_CARE_PROVIDER_SITE_OTHER): Payer: Self-pay

## 2021-03-05 ENCOUNTER — Encounter: Payer: Self-pay | Admitting: Specialist

## 2021-03-05 ENCOUNTER — Ambulatory Visit: Payer: Medicare Other | Attending: Specialist

## 2021-03-05 NOTE — Pre-Procedure Instructions (Addendum)
Instructed client to consult her prescribing doctor for insulin adjustments. She stated she knows what to do; she's had previous surgeries.     Surgical Risk Level: low     Surgeon Testing Requirements: na    H&P: epic notes     Anesthesia Guideline Requirements:na    Specialist Notes/Test Results/Records Requested:    Mary [name] - cardiology notes in care everywhere.     Recent Hospitalization/ED Visit: na    Future Plan/Appointments: na    Labs/Testing @ [facility] PSS: na    Email sent to patient: na    E-Fax sent to pharmacy:  na    NPO Instructions given to patient:    o NPO instructions reviewed: Clear liquids up to 2 hours prior to arrival time, then NPO. No solid food 8 hours prior to scheduled procedure time. Examples of clear liquids include water, apple juice, sports drinks such as Gatorade, coffee or tea without milk or cream. Sugar or sweetener may be added  o Fasting Requirements per Preoperative Fasting Guidelines for Elective Surgeries and Procedures Requiring Anesthesia Policy Revised Q000111Q.  Ingested material Fasting requirement   Clear liquids/Ice Chips 2 hours prior to arrival time   Breast milk 4 hours prior to scheduled procedure time   Infant formula 6 hours prior to scheduled procedure time   Non-human milk 8 hours prior to scheduled procedure time   Solid food 8 hours prior to scheduled procedure time     o Bowel prep instructions (if applicable): follow doctor's instructions    Epic Orders Entered:   o Preop Nursing Anesthesia Orders      Other Outlying information gathered that does not fit anywhere else: na    Chart Room Handoff for Further  Follow-up if Applicable: see above if applicable      Visitor Restriction Guidelines per [facility] as of [id number]:  All visitors must adhere to the following:    Exhibit no COVID-19 symptoms    Age 27+ (internally exceptions can be made by administrative team as needed, e.g., siblings, end of life situations, etc.)    In keeping with the CDCs  current guidance, regardless of vaccination status, everyone in a healthcare facility must wear a mask covering their mouth and nose the entire time they are in the facility. Visitors who fail to wear a mask properly will be asked to leave.    The following face coverings cannot be worn at any [place] location: gaiter style masks, bandanas or vented masks.    No visitors are allowed for patients with suspected or confirmed COVID-19, except in end-of-life situations.  Hospital Inpatient:   Visitation hours: 9 a.m. - 6:30 p.m. daily    Adult patients may have two visitors, in addition to a [employer] Person (DSP), if applicable    Pediatric patients may have two parents/guardians at bedside 24/7. For pediatric outpatient areas, only parents/guardians may visit  Outpatient/Ambulatory Surgery:    Adult patients may have one visitor, in addition to their Designated Support Person (DSP) on the day of surgery.    No family members will be allowed into Phase 1 recovery areas. Physicians will call contact person to give report of procedure.    Family / visitor will be called by PACU staff  to review discharge instructions via phone and answer any questions.     Advise to call surgeon if need to cancel surgery arises.      Patient verbalized understanding and acceptance of above information.

## 2021-03-08 NOTE — Pre-Procedure Instructions (Signed)
Day Before Surgery Confirmation Call    Spoke to:pt  Left Message:    Confirmed surgery date, arrival time, and location.   Arrival: 1315  Surgery: 1115    NPO instructions reviewed: Clear liquids up to 2 hours prior to arrival time, then NPO. No solid food 8 hours prior to scheduled procedure time. Examples of clear liquids include water, apple juice, sports drinks such as Gatorade, coffee or tea without milk or cream. Sugar or sweetener may be added    Fasting Requirements per Preoperative Fasting Guidelines for Elective Surgeries and Procedures Requiring Anesthesia Policy Revised [id number]  Ingested material Fasting requirement   Clear liquids/Ice Chips 2 hours prior to arrival time   Breast milk 4 hours prior to scheduled procedure time   Infant formula 6 hours prior to scheduled procedure time   Non-human milk 8 hours prior to scheduled procedure time   Solid food 8 hours prior to scheduled procedure time       Ambulatory Screening Tool:   Patient instructed that if you or your family does develop new symptoms of acute respiratory illness, to contact surgeons office immediately, prior to arrival at the hospital.     Patient denies recent visit to ED, hospitalization or PCP visit for acute illness since PSS RN interview.     Visitor Restriction Guidelines per [facility] as of 0000000:  All visitors must adhere to the following:    Exhibit no COVID-19 symptoms    Age 29+    In keeping with the CDCs current guidance, regardless of vaccination status, everyone in a healthcare facility must wear a mask covering their mouth and nose the entire time they are in the facility. Visitors who fail to wear a mask properly will be asked to leave. The following face coverings cannot be worn at any [place] location: gaiter style masks, bandanas or vented masks.    No visitors are allowed for patients with suspected or confirmed COVID-19, except in end-of-life situations.  Hospital Inpatient:   Visitation hours: 9:00  a.m. - 6:30 p.m. daily    Adult patients may have two (age 29+) per day.   Pediatric patients may have two parents/guardians at bedside 24/7.   Outpatient/Ambulatory Surgery:    Adult patients may have only one designated visitor (age 47+) to accompany the patient   For pediatric outpatients, only parents/guardians at bedside.    No family members/visitors will be allowed into Phase 1 recovery areas. Physicians will call contact person to give report of procedure.    Family / visitor will be called by PACU staff  to review discharge instructions via phone and answer any questions.     Advise to call surgeon if need to cancel surgery arises.     Patient verbalized understanding and acceptance of above information.  If a voicemail is left for the patient and any of the [place] ambulatory screening questions are positive, patient is asked to call PSS ASAP.

## 2021-03-09 DIAGNOSIS — R031 Nonspecific low blood-pressure reading: Secondary | ICD-10-CM

## 2021-03-09 HISTORY — DX: Nonspecific low blood-pressure reading: R03.1

## 2021-03-11 ENCOUNTER — Ambulatory Visit: Payer: Medicare Other | Admitting: Certified Registered"

## 2021-03-11 ENCOUNTER — Encounter: Payer: Self-pay | Admitting: Specialist

## 2021-03-11 ENCOUNTER — Ambulatory Visit: Payer: Medicare Other

## 2021-03-11 ENCOUNTER — Ambulatory Visit
Admission: RE | Admit: 2021-03-11 | Discharge: 2021-03-11 | Disposition: A | Discharge status: Home / Self Care | Payer: Medicare Other | Source: Ambulatory Visit | Attending: Specialist | Admitting: Specialist

## 2021-03-11 ENCOUNTER — Encounter
Admission: RE | Disposition: A | Discharge status: Home / Self Care | Payer: Self-pay | Source: Ambulatory Visit | Attending: Specialist

## 2021-03-11 DIAGNOSIS — T82858A Stenosis of vascular prosthetic devices, implants and grafts, initial encounter: Secondary | ICD-10-CM | POA: Insufficient documentation

## 2021-03-11 DIAGNOSIS — Y841 Kidney dialysis as the cause of abnormal reaction of the patient, or of later complication, without mention of misadventure at the time of the procedure: Secondary | ICD-10-CM | POA: Insufficient documentation

## 2021-03-11 DIAGNOSIS — N185 Chronic kidney disease, stage 5: Secondary | ICD-10-CM | POA: Insufficient documentation

## 2021-03-11 DIAGNOSIS — Y828 Other medical devices associated with adverse incidents: Secondary | ICD-10-CM | POA: Insufficient documentation

## 2021-03-11 DIAGNOSIS — N186 End stage renal disease: Secondary | ICD-10-CM | POA: Insufficient documentation

## 2021-03-11 HISTORY — PX: FISTULOGRAM: SHX4117

## 2021-03-11 LAB — GLUCOSE WHOLE BLOOD - POCT: Whole Blood Glucose POCT: 129 mg/dL — ABNORMAL HIGH (ref 70–100)

## 2021-03-11 SURGERY — FISTULOGRAM
Anesthesia: Anesthesia MAC / Sedation | Site: Arm Upper | Laterality: Left | Wound class: Clean

## 2021-03-11 MED ORDER — IODIXANOL 320 MG/ML IV SOLN
INTRAVENOUS | Status: DC | PRN
Start: 2021-03-11 — End: 2021-03-11
  Administered 2021-03-11: 30 mL via INTRAVENOUS

## 2021-03-11 MED ORDER — CEFAZOLIN SODIUM 1 G IJ SOLR
INTRAMUSCULAR | Status: AC
Start: 2021-03-11 — End: ?
  Filled 2021-03-11: qty 2000

## 2021-03-11 MED ORDER — PROPOFOL 10 MG/ML IV EMUL (WRAP)
INTRAVENOUS | Status: AC
Start: 2021-03-11 — End: ?
  Filled 2021-03-11: qty 40

## 2021-03-11 MED ORDER — CEFAZOLIN SODIUM 1 G IJ SOLR
INTRAMUSCULAR | Status: DC | PRN
Start: 2021-03-11 — End: 2021-03-11
  Administered 2021-03-11: 2 g via INTRAVENOUS

## 2021-03-11 MED ORDER — LIDOCAINE-EPINEPHRINE 2 %-1:100000 IJ SOLN
INTRAMUSCULAR | Status: DC | PRN
Start: 2021-03-11 — End: 2021-03-11
  Administered 2021-03-11: 2 mL

## 2021-03-11 MED ORDER — ONDANSETRON HCL 4 MG/2ML IJ SOLN
INTRAMUSCULAR | Status: AC
Start: 2021-03-11 — End: ?
  Filled 2021-03-11: qty 2

## 2021-03-11 MED ORDER — ACETAMINOPHEN 500 MG PO TABS
1000.0000 mg | ORAL_TABLET | Freq: Once | ORAL | Status: DC | PRN
Start: 2021-03-11 — End: 2021-03-11

## 2021-03-11 MED ORDER — LIDOCAINE-EPINEPHRINE 2 %-1:100000 IJ SOLN
INTRAMUSCULAR | Status: AC
Start: 2021-03-11 — End: ?
  Filled 2021-03-11: qty 20

## 2021-03-11 MED ORDER — SODIUM CHLORIDE 0.9 % IV SOLN
INTRAVENOUS | Status: DC | PRN
Start: 2021-03-11 — End: 2021-03-11

## 2021-03-11 MED ORDER — AMMONIA AROMATIC IN INHA
1.0000 | Freq: Once | RESPIRATORY_TRACT | Status: DC | PRN
Start: 2021-03-11 — End: 2021-03-11

## 2021-03-11 MED ORDER — OXYCODONE HCL 5 MG PO TABS
5.0000 mg | ORAL_TABLET | Freq: Once | ORAL | Status: DC | PRN
Start: 2021-03-11 — End: 2021-03-11

## 2021-03-11 MED ORDER — FENTANYL CITRATE (PF) 50 MCG/ML IJ SOLN (WRAP)
INTRAMUSCULAR | Status: AC
Start: 2021-03-11 — End: ?
  Filled 2021-03-11: qty 2

## 2021-03-11 MED ORDER — LACTATED RINGERS IV SOLN
INTRAVENOUS | Status: DC
Start: 2021-03-11 — End: 2021-03-11

## 2021-03-11 MED ORDER — HEPARIN SODIUM (PORCINE) 5000 UNIT/ML IJ SOLN
INTRAMUSCULAR | Status: AC
Start: 2021-03-11 — End: ?
  Filled 2021-03-11: qty 2

## 2021-03-11 SURGICAL SUPPLY — 88 items
ADHESIVE SKIN CLOSURE DERMABOND ADVANCED (Skin Closure) ×1 IMPLANT
ADHESIVE SKIN CLOSURE DERMABOND ADVANCED .7 ML LIQUID APPLICATOR (Skin Closure) ×1 IMPLANT
ADHESIVE SKIN DERMABOND ADV (Skin Closure) ×1
ADHESIVE SKNCLS 2 OCTYL CYNCRLT .7ML (Skin Closure) ×1
BALLOON CATH CONQUEST 8X4X75 (Balloons) ×1
BANDAGE GZE CTTN MED KRLX 3.6YDX6.4IN LF (Dressing)
BANDAGE KERLIX MEDIUM GAUZE L3.6 YD X (Dressing) IMPLANT
CATH DIAGNSTC 0SH 65CM 5FR (Opthamology Supply) ×1
CATHETER ANGIO NYL HDRPH 135D VERT CRV (Opthamology Supply) ×1
CATHETER BALLOON DILATATION L4 CM L75 CM (Balloons) ×1 IMPLANT
CATHETER BLNDIL CONQ 8MM 75CM RADOPQ RPD (Balloons) ×1
CATHETER OD5 FR L65 CM RADIOPAQUE BRAID (Opthamology Supply) ×1 IMPLANT
CATHETER OD5 FR L65 CM RADIOPAQUE BRAID SELECTIVE 135 D VERT CURVE (Opthamology Supply) ×1 IMPLANT
CATHETER ODSEC8 MM L75 CM CONQUEST BALLOON DILATATION L4 CM RADIOPAQUE (Balloons) IMPLANT
CONTAINER SPEC PE 4OZ PRCS 2.5X3IN LF (Procedure Accessories) ×1 IMPLANT
CONTAINER STRL 4.5OZ (Procedure Accessories) ×1
COVER FOOT SWITCH (Drape) ×1
COVER PRB ISOSILK ULTRACOVER 48X5IN LF (Procedure Accessories) ×1 IMPLANT
COVER PROBE ULTRACOVER 48X5IN TELESCOPIC FOLD TAPE STRIP BAND (Procedure Accessories) ×1 IMPLANT
COVER SURG PROBE 5X48IN (Procedure Accessories) ×1
DEVICE INFL 25 ATM INDFLTR 35.5CM DGT (Procedure Accessories) ×1
DEVICE INFLATION L35.5 CM INDEFLATOR 25 (Procedure Accessories) ×1 IMPLANT
DEVICE INFLATION L35.5 CM INDEFLATOR 25 ATM DIGITAL ANGIOPLASTY (Procedure Accessories) IMPLANT
DRAPE 74X41IN UNIVERSAL POLY XRAY C ARM CLOSURE STRAP (Drape) ×1 IMPLANT
DRAPE EQP NS FTSWTCH BG DRW STNG (Drape) ×1 IMPLANT
DRAPE EQP VLCR POLY UNV STRDRP 74X41IN (Drape) ×1 IMPLANT
DRAPE FOOTSWITCH BAG DRAW STRING EQUIPMENT NONSTERILE (Drape) ×1 IMPLANT
DRAPE XRAY C-ARM 27X17X70 (Drape) ×1
ELECTRODE ADULT PATIENT RETURN L9 FT REM POLYHESIVE ACRYLIC FOAM (Procedure Accessories) ×1 IMPLANT
ELECTRODE PATIENT RETURN L9 FT VALLEYLAB (Procedure Accessories) ×1 IMPLANT
ELECTRODE PT RTN RM PHSV ACRL FM C30- LB (Procedure Accessories) ×1
GAUZE KRLX STRL 3.4INX3.6YRD (Dressing)
GEL ULTRASOUND AQUASONIC 60GM (Procedure Accessories) ×1
GEL ULTRASOUND AQUASONIC CLEAR DOPPLER TRANSMISSION BACTERIOSTATIC (Procedure Accessories) ×1 IMPLANT
GEL US AQSN C DPLR TRNMS BCTRST FRHYD FR (Procedure Accessories) ×1 IMPLANT
GLOVE SRG NTR RBR 8 BGL SRG LTX STRL PF (Glove) ×2
GLOVE SURG BIOGEL LF SZ8 (Glove) ×2
GLOVE SURGICAL 8 BIOGEL SURGEONS POWDER (Glove) ×2 IMPLANT
GLOVE SURGICAL 8 BIOGEL SURGEONS POWDER FREE BEAD CUFF TEXTURE SURFACE (Glove) ×2 IMPLANT
GUIDEWIRE VASC ANG STORQ .035IN 180CM LF (Guidwire) ×1 IMPLANT
GUIDEWIRE VASCULAR OD.035 IN L180 CM STORQ ANGLE SOFT TIP (Guidwire) ×1 IMPLANT
GW STORQ SFTTIP .035X180CM ANG (Guidwire) ×1
INFLTN DEVICE MONRCH+ANGIO PK (Procedure Accessories) ×1
INTRO AVANTI+ 8FX11CM (Introducer) ×1
INTRODUCER 9F PINN 10CM (Introducer)
INTRODUCER MICRPNCT 4F 10CM (Introducer) ×1
INTRODUCER SHEATH OD7 FR L11 CM MINI (Sheaths) IMPLANT
INTRODUCER SHEATH OD7 FR L11 CM MINI GUIDEWIRE AVANTI+ STAINLESS STEEL (Sheaths) IMPLANT
INTRODUCER SHEATH OD8 FR L11 CM MINI (Introducer) ×1 IMPLANT
INTRODUCER SHEATH OD8 FR L11 CM MINI GUIDEWIRE AVANTI+ STAINLESS STEEL (Introducer) IMPLANT
INTRODUCER SHTH SS .038IN STD LN AVNT+ 8 (Introducer) ×1
INTRODUCER SHTH SS .038IN STDLN AVNT+ 7 (Sheaths)
KIT INFECTION CONTROL CUSTOM (Kits) ×2 IMPLANT
KIT INFECTION CONTROL CUSTOM IFOH03 (Kits) ×1 IMPLANT
MANIFOLD NEPTUNE II 4PORT SUCT (Filter)
MANIFOLD SCT 2 STD NPTN 2 LF NS 4 PORT (Filter)
MANIFOLD SUCTION 2 STANDARD 4 PORT (Filter) IMPLANT
MANIFOLD SUCTION 2 STANDARD 4 PORT NEPTUNE 2 WASTE MANAGEMENT SYSTEM (Filter) IMPLANT
PAD ELECTROSRG GRND REM W CRD (Procedure Accessories) ×1
SET ACC NTNL PLTN .018IN MPNCH 4FR 21GA (Introducer) ×1 IMPLANT
SET ACCESS L40 CM .018 IN STIFFEN CANNULA COAXIAL CATHETER GUIDEWIRE (Introducer) ×1 IMPLANT
SET EXTEN 4ML 30IN LL NONLTX (IV Supply) ×1
SET EXTENSION L29 IN STRAIGHT MALE LUER (IV Supply) ×1 IMPLANT
SET EXTENSION L29 IN STRAIGHT MALE LUER LOCK ADAPTER STANDARD BORE (IV Supply) ×1 IMPLANT
SET XTN DEHP 4ML STRG 29IN LF STRL M LL (IV Supply) ×1
SHEATH AVANTI+ W/MW 7F 11CM (Sheaths)
SHEATH INTRO SS HDRPH PTFE PNCL 9FR 10CM (Introducer)
SHEATH INTRODUCER L10 CM L2.5 CM ID9 FR SNAP ON DILATOR PERIPHERAL (Introducer) IMPLANT
SHEATH INTRODUCER L10 CM SNAP ON DILATOR (Introducer) IMPLANT
SOL NACL .9% 500ML LATEX (IV Solutions) ×1
SOLUTION IV 0.9% NACL 500ML VFLX LF PLS (IV Solutions) ×1
SOLUTION IV 0.9% SODIUM CHLORIDE 500 ML (IV Solutions) ×1 IMPLANT
SOLUTION IV 0.9% SODIUM CHLORIDE 500 ML PLASTIC CONTAINER (IV Solutions) ×1 IMPLANT
STOPCOCK 4-WAY LARGE BORE (IV Supply) ×1
STOPCOCK IV 4 WAY LARGE BORE ROTATE MALE (IV Supply) ×1 IMPLANT
STOPCOCK IV 4 WAY LARGE BORE ROTATE MALE LUER LOCK ADAPTER LIPID (IV Supply) ×1 IMPLANT
STOPCOCK IV LF STRL 4W LG BORE ROT M LL (IV Supply) ×1
SYRINGE 10 ML GRADUATE NONPYROGENIC DEHP (Syringes, Needles) ×2 IMPLANT
SYRINGE 10 ML GRADUATE NONPYROGENIC DEHP FREE PVC FREE LOK MEDICAL (Syringes, Needles) ×2 IMPLANT
SYRINGE 30 ML CONCENTRIC TIP GRADUATE (Syringes, Needles) ×2 IMPLANT
SYRINGE 30 ML CONCENTRIC TIP GRADUATE NONPYROGENIC DEHP FREE LOK (Syringes, Needles) ×2 IMPLANT
SYRINGE LUER LOCK 10CC (Syringes, Needles) ×2
SYRINGE LUER LOCK WO SFTY 30ML (Syringes, Needles) ×2
SYRINGE MED 10ML LL LF STRL GRAD N-PYRG (Syringes, Needles) ×2
SYRINGE MED 30ML LL LF STRL CONC TIP (Syringes, Needles) ×2
TRAY SRG VASCULAR IFOH (Pack) ×1 IMPLANT
TRAY SURGICAL VASCULAR (Pack) ×1 IMPLANT
TRAY VASCULAR FOAKS (Pack) ×1

## 2021-03-11 NOTE — Op Note (Signed)
FULL OPERATIVE NOTE    Date Time: 03/11/21 1:43 PM  Patient Name: Mary [name]  Attending Physician: [name], MD      Date of Operation:   03/11/2021    Providers Performing:   Surgeon(s):  [name], MD    Circulator: Raliegh Scarlet, RN  Scrub Person: Alesia Richards, RN  First Assistant: Laverle Patter  Preceptor: Lavella Hammock, RN    Operative Procedure:   Procedure(s):  LEFT ARM FISTULOGRAM    Preoperative Diagnosis:   Pre-Op Diagnosis Codes:     * ESRD (end stage renal disease) [N18.6]    Postoperative Diagnosis:   Post-Op Diagnosis Codes:     * ESRD (end stage renal disease) [N18.6]    Indications:   Patient has chronic renal failure with significant pulsation left brachiocephalic AV fistula with significant stenosis at the cephalic arch therefore decision was made to proceed with the above procedure  The risks and benefits of the procedures were explained to the patient and agreed. The risk of infection, bleeding, thrombosis, steal syndrome, nerve injury and other complications were explained in detail and agreed.       Operative Notes:   Patient was brought to the operating suite no IV could be obtained by the anesthesia staff therefore left brachiocephalic AV fistula was cannulated with a micropuncture needle micropuncture sheath was inserted and a fistulogram was obtained which showed the cephalic arch which was previously stented from cephalic arch all the way down almost the entire segment of the fistula had significant neointimal hyperplasia in the proximal aspect.  2 g of Ancef was given through the sheath and then sheath was upsized to a 8 [place] sheath repeat fistulogram was obtained 035 wire was advanced and a 8 mm x 4 cm conquest balloon was used the entire cephalic arch and proximal stents were balloon angioplastied to full effacement.  Then at this time there was reasonable thrill present repeat fistulogram revealed significant improvement.  There is short  segment area of narrowing of the fistula distally which possibly in future would benefit from revision with patch angioplasty.  3-0 Monocryl pursestring suture was placed around the sheath patient tolerated the procedure well was transferred to recovery room    Estimated Blood Loss:   * No values recorded between 03/11/2021  1:00 PM and 03/11/2021  1:43 PM *    Implants:   * No implants in log *    Drains:       Specimens:       Complications:       Signed by: [name], MD, MD

## 2021-03-11 NOTE — Anesthesia Preprocedure Evaluation (Signed)
Anesthesia Evaluation    AIRWAY    Mallampati: II    TM distance: >3 FB  Neck ROM: full  Mouth Opening:full   CARDIOVASCULAR    regular and normal       DENTAL        (+) upper dentures and lower dentures   PULMONARY    clear to auscultation     OTHER FINDINGS                  Relevant Problems   PULMONARY   (+) Exertional shortness of breath      CARDIO   (+) Congestive heart failure   (+) Dvt femoral (deep venous thrombosis)   (+) Essential (primary) hypertension   (+) Exertional shortness of breath      GU/RENAL   (+) Chronic renal failure, stage 5   (+) Dependence on renal dialysis   (+) Hypertensive renal disease   (+) Unspecified kidney failure               Anesthesia Plan    ASA 3     MAC               (Dialysis done this AM, [facility]ers at 8:30. The fistulogram will be done under MAC with no sedation, accepted by patient. Poor IV access, will use AV fistula for IV.)                  informed consent obtained    Plan discussed with CRNA.                   Signed by: Norberta Keens, MD 03/11/21 12:55 PM

## 2021-03-11 NOTE — Anesthesia Postprocedure Evaluation (Signed)
Anesthesia Post Evaluation    Patient: Mary [name]    Procedure(s):  LEFT ARM FISTULOGRAM    Anesthesia type: MAC    Last Vitals:   Vitals Value Taken Time   BP 166/73 03/11/21 1350   Temp 36.4 C (97.6 F) 03/11/21 1346   Pulse 65 03/11/21 1350   Resp 16 03/11/21 1350   SpO2 97 % 03/11/21 1350                 Anesthesia Post Evaluation:     Patient Evaluated: PACU  Patient Participation: complete - patient participated  Level of Consciousness: awake and alert  Pain Score: 0  Pain Management: adequate    Airway Patency: patent    Anesthetic complications: No      PONV Status: none    Cardiovascular status: acceptable  Respiratory status: acceptable  Hydration status: acceptable        Signed by: Norberta Keens, MD, 03/11/2021 2:34 PM

## 2021-03-11 NOTE — H&P (Signed)
Mary Altes, MD    Vascular Surgery  Chronic renal failure, stage 5    Dx  Follow-up; Referred by Ferol Luz, MD    Reason for Visit       Progress Notes  Mary Altes, MD (Physician)   Vascular Surgery  [facility]         Chief Complaint   Patient presents with    Follow-up     c/o BLE circulation issues         History of Present Illness     Mary [name] a 75 y.o.femalewho presents for routine follow-up of left upper arm cephalic vein transposition.  Patient complains of excess bleeding.  Patient has undergone follow-up duplex ultrasound examination.    Past Medical History     Past Medical History        Past Medical History:   Diagnosis Date    Abnormal vision     glasses    Arthritis     Cataracts, bilateral     Deep venous thrombosis of distal lower extremity ~2013    left leg    End-stage renal disease 2013    Hemodialysis MWF- [facility]    GERD (gastroesophageal reflux disease)     Hyperlipidemia     Hypertension     no meds    Type 2 diabetes mellitus, controlled     IDDm/ oral med. FBS ~ 119-120          Allergies           Allergies   Allergen Reactions    Penicillins Hives     Onset date: 11-24-2011    Shrimp [Shellfish Allergy] Swelling     Also allergic to crab       Medications            Current Outpatient Medications on File Prior to Visit   Medication Sig Dispense Refill    Accu-Chek FastClix Lancets Misc USE TO CHECK BLOOD SUGARS THREE TIMES DAILY 300 each 3    Accu-Chek Guide test strip U TID      acetaminophen (TYLENOL) 500 MG tablet Take 1 tablet (500 mg total) by mouth every 12 (twelve) hours as needed. (Patient taking differently: Take 1,000 mg by mouth every 12 (twelve) hours as needed   ) 30 tablet 0    aspirin 81 MG tablet Take 81 mg by mouth daily.       atorvastatin (LIPITOR) 10 MG tablet TAKE 1 TABLET(10 MG) BY MOUTH EVERY MORNING (Patient taking differently: 20 mg   )  90 tablet 0    BD Pen Needle Nano 2nd Gen 32G X 4 MM Misc USE WITH INSULIN PEN TID      calcium acetate (PHOSLO) 667 MG capsule TAKE 1 CAPSULE(667 MG) BY MOUTH THREE TIMES DAILY WITH MEALS 90 capsule 0    Colchicine 0.6 MG Cap TK 1 C PO BID PRN FOR GOUT      Febuxostat (ULORIC) 40 MG tablet Take 40 mg by mouth every morning         gabapentin (NEURONTIN) 300 MG capsule TAKE 2 CAPSULES(600 MG) BY MOUTH THREE TIMES DAILY 540 capsule 0    indomethacin (INDOCIN) 50 MG capsule Take 50 mg by mouth 2 (two) times daily with meals      insulin aspart (NovoLOG FlexPen) 100 UNIT/ML injection pen Inject 5 Units into the skin 3 (three) times daily before meals 2 pen 3    insulin lispro (  HumaLOG) 100 UNIT/ML injection Inject 5 Units into the skin 3 (three) times daily before meals      Levemir FlexTouch 100 UNIT/ML injection pen INJECT 10 UNITS INTO THE SKIN EVERY EVENING 9 mL 1    Magnesium Hydroxide (DULCOLAX PO) Take by mouth as needed      Multiple Vitamin (MULTIVITAMIN PO) Take 1 tablet by mouth daily         nystatin (MYCOSTATIN) ointment APPLY TWICE DAILY X 10 DAYS 30 g 0    SITagliptin (JANUVIA) 25 MG tablet Take 25 mg by mouth every morning      triamcinolone (KENALOG) 0.1 % cream Apply twice daily 80 g 1    vitamin D (CHOLECALCIFEROL) 25 MCG (1000 UT) tablet Take 1,000 Units by mouth daily       No current facility-administered medications on file prior to visit.       Review of Systems     Constitutional: Negative for fevers and chills  Skin: No rash or lesions  Respiratory: Negative for cough, wheezing, or hemoptysis  Cardiovascular: as per HPI  Gastrointestinal: Negative for abdominal pain, nausea, vomiting and diarrhea  Musculoskeletal: No arthritic symptoms  Genitourinary: Negative for dysuria  All other systems were reviewed and are negative except what is stated in the HPI    Physical Exam     Vitals       Vitals:    07/04/20 1251   BP: 118/61   Pulse: 71   Temp: 97.4  F (36.3 C)   SpO2: 97%          Body mass index is 30.22 kg/m.    General:Patient appears their stated age, well-nourished.Alert and in no apparent distress.  Lungs: Respiratory effort unlabored, chest expansion symmetric.  Cardiac: RRR, no JVD.   Extremities warm, Right Femoral pulses 2+, Left Femoral 2+,  Abd: Soft, nondistended, nontender.  WG:[id number] ROM in all 4 extremities, symmetric   Skin: Color appropriate for race, Skin warm, dry,no gangrene, no non healing ulcers, no varicose veins , no hyperpigmentation, no lipo-dermatosclerosis  Neuro: Good insight and judgment, oriented to person, place, and time CN II-XII intact, gross motor and sensory intact      Labs     CBC:        WBC   Date/Time Value Ref Range Status   11/11/2013 05:13 PM 6.19 3.5 - 10.8 Final           RBC   Date/Time Value Ref Range Status   11/11/2013 05:13 PM 3.86 (L) 4.20 - 5.40 Final           Hgb   Date/Time Value Ref Range Status   11/11/2013 05:13 PM 11.5 (L) 12.0 - 16.0 g/dL Final           Hematocrit   Date/Time Value Ref Range Status   11/11/2013 05:13 PM 34.8 (L) 37.0 - 47.0 % Final           MCV   Date/Time Value Ref Range Status   11/11/2013 05:13 PM 90.2 80.0 - 100.0 fL Final           MCHC   Date/Time Value Ref Range Status   11/11/2013 05:13 PM 33.0 32 - 36 g/dL Final           RDW   Date/Time Value Ref Range Status   11/11/2013 05:13 PM 17 (H) 12.0 - 15.0 % Final           Platelets   Date/Time  Value Ref Range Status   11/11/2013 05:13 PM 183 140 - 400 Final       CMP:        Sodium   Date/Time Value Ref Range Status   02/20/2014 04:37 PM 141 136 - 145 mEq/L Final           Potassium   Date/Time Value Ref Range Status   02/20/2014 04:37 PM 4.5 3.5 - 5.1 mEq/L Final           Chloride   Date/Time Value Ref Range Status   02/20/2014 04:37 PM 100 98 - 107 mEq/L Final           CO2   Date/Time Value Ref Range Status   02/20/2014 04:37 PM 23 22 - 29 mEq/L  Final             Glucose   Date/Time Value Ref Range Status   02/20/2014 04:37 PM 194 (H) 70 - 100 mg/dL Final     Comment:     Specimen moderately lipemic. ER  Interpretive Data for Adult Female and Female Population  Indeterminate Range: 100-125 mg/dL  Equal to or greater than 126 mg/dL meets the ADA  guidelines for Diabetes Mellitus diagnosis if symptoms  are present and confirmed by repeat testing.  Random (Non-Fasting)Interpretive Data (Adults):  Equal to or greater than 200 mg/dL meets the ADA  guidelines for Diabetes Mellitus diagnosis if symptoms  are present and confirmed by Fasting Glucose or GTT.           BUN   Date/Time Value Ref Range Status   02/20/2014 04:37 PM 75 (H) 7 - 19 mg/dL Final           Protein, Total   Date/Time Value Ref Range Status   06/12/2020 02:44 PM 7.8 6.0 - 8.3 g/dL Final           Alkaline Phosphatase   Date/Time Value Ref Range Status   06/12/2020 02:44 PM 126 (H) 37 - 106 U/L Final           AST (SGOT)   Date/Time Value Ref Range Status   06/12/2020 02:44 PM 44 (H) 5 - 34 U/L Final           ALT   Date/Time Value Ref Range Status   06/12/2020 02:44 PM 23 0 - 55 U/L Final           Anion Gap   Date/Time Value Ref Range Status   02/20/2014 04:37 PM 18.0 (H) 5 - 15 Final       Lipid Panel        Cholesterol   Date/Time Value Ref Range Status   06/12/2020 02:44 PM 134 0 - 199 mg/dL Final           Triglycerides   Date/Time Value Ref Range Status   06/12/2020 02:44 PM 151 (H) 34 - 149 mg/dL Final             HDL   Date/Time Value Ref Range Status   06/12/2020 02:44 PM 41 40 - 9,999 mg/dL Final     Comment:     An HDL cholesterol <40 mg/dL is low and constitutes a  coronary heart disease risk factor, and HDL-C>59 mg/dL is  a negative risk factor for CHD.  Ref: American Heart Association; Circulation 2004         Coags:        PT   Date/Time Value Ref Range Status  02/20/2014 04:37 PM 25.5 (H) 12.6 - 15.0 sec  Final             PT INR   Date/Time Value Ref Range Status   02/20/2014 04:37 PM 2.4 (H) 0.9 - 1.1 Final     Comment:     Recommended Ranges for Protime INR:  2.0-3.0 for most medical and surgical thromboembolic states  [id number] for artificial heart valves  INR result may not represent exact Warfarin dosing level during  the transition period from Heparin to Warfarin therapy.  Recommend close clinical monitoring.           PTT   Date/Time Value Ref Range Status   11/11/2013 05:13 PM 36 23 - 37 Final       Assessment and Plan       1. 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 Chronic renal failure, stage 5  US Arterial/ Graft Duplex Dopp Low Extrem Bil Comp    Ankle brachial index    [place] Hemodialysis Access Duplex Dopp       Mary [name] a 75 y.o.femalewho presents for routine follow-up of left upper arm cephalic vein transposition.  Patient complains of excess bleeding.  Patient has undergone follow-up duplex ultrasound examination.  The duplex ultrasound reveals high-grade area of stenosis at the cephalic arch proximal to the previously placed covered stent therefore because of significant pulsation I have recommended to her to undergo fistulogram with possible angioplasty.It was explained to the patient and has agreed.  The risks and benefits of the procedures were explained to the patient and agreed. The risk of infection, bleeding, thrombosis, steal syndrome, nerve injury and other complications were explained in detail and agreed.     Shary Key, MD, [facility]  Chief, Section of Vascular [facility]

## 2021-03-11 NOTE — Discharge Instr - AVS First Page (Addendum)
ACCESS DISCHARGE INSTRUCTIONS        Wound Care:  If there is an ACE please remove in AM.  If there are dressings please remove them in 72 hours and wet the incisions  If there are Steri-Strips please remove them in 5 days     Blood Thinners:  If you are taking aspirin or Plavix you may resume as scheduled  If you are taking anticoagulation therapy (Eliquis, Xarelto etc) please restart 48 hours postoperatively.  If you are taking warfarin you may restart on the day of the procedure.        Pain Control:  Some pain and swelling is normal after surgery.  You may take the pain medication as prescribed.        Driving:  When pain is controlled and not taking pain medications      Diet:   You may not feel hungry all the time or you may eat a small amount and feel full.  This is ok.  Some people find eating multiple small meals is easier than 3 large meals.  Continue to drink lots of clear fluids.      Reasons to call the doctor:  Call your doctor or go to emergency room if you have any of the following:  Fever greater than 101.5 F  Pus draining from your wound  Redness around your wound that is spreading  You may not drive or do anything requiring coordination or balance for 24 hours.    Avoid heavy lifting for 1 weeks after any surgery.   Persistent bleeding, swelling or pus at the operative site.  Loss of feeling, severe pain, inability to move fingers or cold hand.    Follow up Care:  Call the office [phone] and make an appointment  To have a duplex US of the fistula in my office in 4 weeks and see me.

## 2021-03-11 NOTE — Discharge Instructions (Signed)

## 2021-03-11 NOTE — Transfer of Care (Signed)
Anesthesia Transfer of Care Note    Patient: Mary [name]    Procedures performed: Procedure(s):  LEFT ARM FISTULOGRAM    Anesthesia type: MAC    Patient location:Phase II PACU    Last vitals:   Vitals:    03/11/21 1346   BP: 182/75   Pulse: 67   Resp: 16   Temp: 36.4 C (97.6 F)   SpO2: 95%       Post pain: Patient not complaining of pain, continue current therapy      Mental Status:awake and alert     Respiratory Function: tolerating room air    Cardiovascular: stable    Nausea/Vomiting: patient not complaining of nausea or vomiting    Hydration Status: adequate    Post assessment: no apparent anesthetic complications and no reportable events    Signed by: Helene Shoe, CRNA  03/11/21 1:59 PM

## 2021-03-12 ENCOUNTER — Encounter: Payer: Self-pay | Admitting: Specialist

## 2021-03-19 ENCOUNTER — Other Ambulatory Visit (INDEPENDENT_AMBULATORY_CARE_PROVIDER_SITE_OTHER): Payer: Self-pay

## 2021-03-19 DIAGNOSIS — Z1231 Encounter for screening mammogram for malignant neoplasm of breast: Secondary | ICD-10-CM

## 2021-03-20 ENCOUNTER — Other Ambulatory Visit (INDEPENDENT_AMBULATORY_CARE_PROVIDER_SITE_OTHER): Payer: Self-pay

## 2021-03-20 DIAGNOSIS — Z780 Asymptomatic menopausal state: Secondary | ICD-10-CM

## 2021-03-20 DIAGNOSIS — I129 Hypertensive chronic kidney disease with stage 1 through stage 4 chronic kidney disease, or unspecified chronic kidney disease: Secondary | ICD-10-CM

## 2021-03-20 DIAGNOSIS — N185 Chronic kidney disease, stage 5: Secondary | ICD-10-CM

## 2021-03-22 ENCOUNTER — Encounter (INDEPENDENT_AMBULATORY_CARE_PROVIDER_SITE_OTHER): Payer: Self-pay | Admitting: Student in an Organized Health Care Education/Training Program

## 2021-03-22 ENCOUNTER — Ambulatory Visit (INDEPENDENT_AMBULATORY_CARE_PROVIDER_SITE_OTHER): Payer: Medicare Other | Admitting: Student in an Organized Health Care Education/Training Program

## 2021-03-22 VITALS — BP 103/63 | HR 95 | Temp 98.6°F | Ht 63.0 in | Wt 164.2 lb

## 2021-03-22 DIAGNOSIS — M810 Age-related osteoporosis without current pathological fracture: Secondary | ICD-10-CM

## 2021-03-22 DIAGNOSIS — Z992 Dependence on renal dialysis: Secondary | ICD-10-CM

## 2021-03-22 DIAGNOSIS — N185 Chronic kidney disease, stage 5: Secondary | ICD-10-CM

## 2021-03-22 DIAGNOSIS — E1142 Type 2 diabetes mellitus with diabetic polyneuropathy: Secondary | ICD-10-CM

## 2021-03-22 DIAGNOSIS — I953 Hypotension of hemodialysis: Secondary | ICD-10-CM

## 2021-03-22 DIAGNOSIS — N2581 Secondary hyperparathyroidism of renal origin: Secondary | ICD-10-CM

## 2021-03-22 NOTE — Progress Notes (Signed)
Subjective:      Patient ID: Mary [name] is a 75 y.o. female.    Chief Complaint:  No chief complaint on file.      HPI:  75 YO Female  Presents for hospital F/U    03/13/2021 seen at [facility] ED for weakness after dialysis and in and out consciousness per family  Pt reports that she had not eaten much before dialysis  Now eating crackers or a snack while in dialysis  Given IV fluids  Denies confusion or memory problems since discharge  No problems driving  [id number] seen at [facility] for Left arm fistulogram    Mildly anemic in the ER, stable from baseline  Potassium slightly low  Glucose 218 in the ER    BP dropping during dialysis  Nephrologist increased midodrine to 3 times daily  Had dialysis this morning, was not released to go home until BP increased    Blood sugar 134 this morning  BS 200s if eating higher carb foods  Taking lantus 10 units at bedtime  Takes novolog 5 units if BS >200  a1c 6.1 when checked by nephrology    PTH 230 on labs done by nephrology  Recent dexa showed osteoporosis      Problem List:  Patient Active Problem List   Diagnosis    Unspecified kidney failure    IDDM (insulin dependent diabetes mellitus)    Essential (primary) hypertension    Chest pain at rest    Viral pericarditis    Exertional shortness of breath    Chest pain    Pleural effusion    Chronic renal failure, stage 5    Acute calculous cholecystitis    Acute pain of left knee    Bilateral low back pain with sciatica    Dependence on renal dialysis    Diabetic neuropathy    Dvt femoral (deep venous thrombosis)    Edema    Elevated LFTs    Hyperkalemia    Hyperlipidemia    Hypertensive renal disease    Neuropathy    Primary localized osteoarthritis    Spondylosis of lumbosacral region without myelopathy or radiculopathy    Congestive heart failure    Pure hypercholesterolemia, unspecified    Disease of pericardium    Long-term insulin use    PVD (peripheral vascular disease)     Clostridioides difficile diarrhea    Failure to thrive in adult    Secondary hyperparathyroidism of renal origin    Age-related osteoporosis without current pathological fracture       Current Medications:  Current Outpatient Medications   Medication Sig Dispense Refill    aspirin 81 MG tablet Take 81 mg by mouth in the morning.  Marland Kitchen      atorvastatin (LIPITOR) 20 MG tablet Take 1 tablet (20 mg total) by mouth daily (Patient taking differently: Take 20 mg by mouth every evening) 90 tablet 1    B Complex-C-Folic Acid (Rena-Vite) Tab Take 1 tablet by mouth daily      CALCIUM PO Take by mouth 3 (three) times daily      gabapentin (NEURONTIN) 300 MG capsule TAKE 2 CAPSULES(600 MG) BY MOUTH THREE TIMES DAILY (Patient taking differently: 600 mg 3 (three) times daily) 540 capsule 0    indomethacin (INDOCIN) 50 MG capsule Take 50 mg by mouth 2 (two) times daily with meals      Levemir FlexTouch 100 UNIT/ML injection pen INJECT 10 UNITS INTO THE SKIN EVERY EVENING 9  mL 1    Magnesium Hydroxide (DULCOLAX PO) Take by mouth as needed      midodrine (PROAMATINE) 10 MG tablet Take 10 mg by mouth as needed         NovoLOG FlexPen 100 UNIT/ML injection pen ADMINISTER 5 UNITS UNDER THE SKIN THREE TIMES DAILY BEFORE MEALS 6 mL 2    nystatin (MYCOSTATIN) cream Apply topically 2 (two) times daily 60 g 0    SITagliptin (JANUVIA) 25 MG tablet Take 25 mg by mouth daily      triamcinolone (KENALOG) 0.1 % cream Apply twice daily (Patient taking differently: 2 (two) times daily Itching under breasts) 80 g 0    vitamin D (CHOLECALCIFEROL) 25 MCG (1000 UT) tablet Take 1,000 Units by mouth daily       No current facility-administered medications for this visit.       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 FBS ~ 119-120 Theodosia Quay        Past Surgical History:  Past Surgical History:   Procedure Laterality Date    AV FISTULA PLACEMENT  2012    CHOLECYSTECTOMY  over 10 yrs ago     COLONOSCOPY  05/22/2018    Results: normal. Reported by Patient.    FISTULOGRAM Left 05/14/2020    Procedure: FISTULOGRAM WITH BALLOON ANGIOPLASTY;  Surgeon: [name], MD;  Location: [facility];  Service: Vascular;  Laterality: Left;    FISTULOGRAM Left 03/11/2021    Procedure: LEFT ARM FISTULOGRAM;  Surgeon: [name], MD;  Location: [facility];  Service: Vascular;  Laterality: Left;    HYSTERECTOMY  over 10 yrs ago     Brave Left 07/07/2019    has been removed     REVISION, A-V FISTULA UPPER EXTREMITY Left 07/21/2019    Procedure: REVISION, A-V FISTULA UPPER EXTREMITY, WITH PATCH ANGIOPLASTY;  Surgeon: [name], MD;  Location: [facility];  Service: Vascular;  Laterality: Left;    REVISION, A-V FISTULA UPPER EXTREMITY Left 05/14/2020    Procedure: REVISION, A-V FISTULA UPPER EXTREMITY WITH PATCH ANGIOPLASTY;  Surgeon: [name], MD;  Location: [facility];  Service: Vascular;  Laterality: Left;    THROMBECTOMY Left 07/21/2019    Procedure: THROMBECTOMY;  Surgeon: [name], MD;  Location: [name];  Service: Vascular;  Laterality: Left;       Family History:  Family  History  Problem Relation Age of Onset    Diabetes Mother     Cancer Father         throat       Social History:  Social History     Socioeconomic History    Marital status: Widowed   Tobacco Use    Smoking status: Never Smoker    Smokeless tobacco: Never Used   Vaping Use    Vaping Use: Never used   Substance and Sexual Activity    Alcohol use: No    Drug use: Never        The following sections were reviewed this encounter by the provider:   Tobacco   Allergies   Meds   Problems   Med Hx   Surg Hx   Fam Hx            ROS:  Review of Systems   Constitutional: Positive for fatigue. Negative for appetite change and fever.   HENT: Negative for trouble swallowing and voice change.    Respiratory: Negative for cough and shortness of breath.    Cardiovascular: Negative for chest pain, palpitations and leg swelling.   Gastrointestinal: Negative for abdominal pain, blood in stool, constipation, diarrhea, nausea and vomiting.        Rectal itching   Genitourinary:        Makes small amount of urine   Musculoskeletal: Positive for arthralgias.   Neurological: Positive for dizziness (after dialysis, nephrology increased midodrine) and numbness (neuropathy stable). Negative for weakness and headaches.   Psychiatric/Behavioral: Positive for confusion (intermittent). Negative for dysphoric mood. The patient is not nervous/anxious.        Vitals:  BP 103/63    Pulse 95    Temp 98.6 F (37 C)    Ht 1.6 m ('5\' 3"'$ )    Wt 74.5 kg (164 lb 3.2 oz)    SpO2 96%    BMI 29.09 kg/m      Objective:     Physical Exam:  Physical Exam  Constitutional:       General: She is not in acute distress.     Appearance: She is not ill-appearing.      Comments: Poor historian   Eyes:      General: No scleral icterus.     Extraocular Movements: Extraocular movements intact.   Cardiovascular:      Rate and Rhythm: Normal rate and regular rhythm.      Heart sounds: No murmur heard.     Comments: No edema  Pulmonary:      Effort: Pulmonary effort  is normal. No respiratory distress.      Breath sounds: Normal breath sounds. No stridor. No wheezing or rhonchi.   Abdominal:      General: There is no distension.      Palpations: There is no mass.      Tenderness: There is no abdominal tenderness.   Genitourinary:     Rectum: Normal.      Comments: Skin near rectum normal, no erythema or purulence; no visible lesions; no fissures  Musculoskeletal:      Cervical back: Neck supple. No rigidity.      Right lower leg: No edema.      Left lower leg: No edema.      Comments: L arm- fistula present   Skin:     Comments: Skin very dry; no erythema or purulence   Neurological:      Mental Status: She is oriented to person, place,  and time.      Cranial Nerves: No cranial nerve deficit.      Sensory: No sensory deficit.      Gait: Gait abnormal.      Comments: Comprehension impaired  Speech normal   Psychiatric:         Mood and Affect: Mood normal.         Thought Content: Thought content normal.         Cognition and Memory: Cognition is impaired. Memory is impaired.          Assessment:     1. Hemodialysis-associated hypotension    2. Diabetic polyneuropathy associated with type 2 diabetes mellitus    3. Age-related osteoporosis without current pathological fracture  - Ambulatory referral to Endocrinology    4. Secondary hyperparathyroidism of renal origin  - Ambulatory referral to Endocrinology    5. Chronic renal failure, stage 5    6. Dependence on renal dialysis      Plan:   Reviewed ER Records  Recommend endo referral to discuss osteoporosis  Eat small amounts frequently, avoid going too long without eating  Follow up with vascular as scheduled  Recommend discussion with nephrologist regarding low BP during dialysis    Ferol Luz, MD

## 2021-03-23 DIAGNOSIS — N2581 Secondary hyperparathyroidism of renal origin: Secondary | ICD-10-CM | POA: Insufficient documentation

## 2021-03-23 DIAGNOSIS — M810 Age-related osteoporosis without current pathological fracture: Secondary | ICD-10-CM | POA: Insufficient documentation

## 2021-03-25 ENCOUNTER — Encounter (INDEPENDENT_AMBULATORY_CARE_PROVIDER_SITE_OTHER): Payer: Self-pay | Admitting: Student in an Organized Health Care Education/Training Program

## 2021-03-29 ENCOUNTER — Encounter (INDEPENDENT_AMBULATORY_CARE_PROVIDER_SITE_OTHER): Payer: Self-pay

## 2021-03-29 ENCOUNTER — Telehealth (INDEPENDENT_AMBULATORY_CARE_PROVIDER_SITE_OTHER): Payer: Self-pay | Admitting: Student in an Organized Health Care Education/Training Program

## 2021-03-29 NOTE — Telephone Encounter (Signed)
Patient requested copy of referral from 03/22/2021 PAA sent in mail per patient request.

## 2021-03-30 ENCOUNTER — Encounter (INDEPENDENT_AMBULATORY_CARE_PROVIDER_SITE_OTHER): Payer: Self-pay

## 2021-04-11 ENCOUNTER — Encounter (INDEPENDENT_AMBULATORY_CARE_PROVIDER_SITE_OTHER): Payer: Self-pay

## 2021-04-11 NOTE — Progress Notes (Signed)
error 

## 2021-04-15 ENCOUNTER — Encounter (INDEPENDENT_AMBULATORY_CARE_PROVIDER_SITE_OTHER): Payer: Self-pay

## 2021-04-15 ENCOUNTER — Other Ambulatory Visit (INDEPENDENT_AMBULATORY_CARE_PROVIDER_SITE_OTHER): Payer: Self-pay | Admitting: Student in an Organized Health Care Education/Training Program

## 2021-04-15 DIAGNOSIS — E1142 Type 2 diabetes mellitus with diabetic polyneuropathy: Secondary | ICD-10-CM

## 2021-04-15 MED ORDER — GABAPENTIN 300 MG PO CAPS
ORAL_CAPSULE | ORAL | 1 refills | Status: DC
Start: 2021-04-15 — End: 2021-08-08

## 2021-04-15 NOTE — Telephone Encounter (Signed)
Hi Dr Dema Severin, refill pending for gabapentin. Please advise if pt should be taking calcium requested, thanks!

## 2021-04-15 NOTE — Telephone Encounter (Signed)
Hi Dr Dema Severin pt states usually gets the refill for gabapentin from [place] and is out of meds. Please advise, thanks!

## 2021-04-15 NOTE — Telephone Encounter (Signed)
Refill Request:    Last Office Visit (Less than 6 months): 03/22/2021  (If greater than 6 months- NEEDS OV SCHEDULED)    Scheduled or declined:   Reason for declining OV:     Prescriber: Ferol Luz, MD  Medication:   gabapentin  gabapentin (NEURONTIN) 300 MG capsule    TAKE 2 CAPSULES(600 MG) BY MOUTH THREE TIMES DAILY, E-Rx  Patient taking differently: 600 mg, 3 times daily       How many days left of meds: 0  Pharmacy:   [facility] [place], [address] AT [place] Phone:  [phone]   Fax:  [phone]          Refills needed: Yes  Medication Questions/Comments: Patient is asking to have her medication sooner as she is out of it. Advise patient to please call atleast a week before she run out of medicatio. Patient understand. Please assist.

## 2021-04-15 NOTE — Telephone Encounter (Signed)
Pt should be getting this from her kidney doctor. Let me know if unable to get it from them

## 2021-04-15 NOTE — Telephone Encounter (Signed)
Please advise 

## 2021-04-15 NOTE — Addendum Note (Signed)
Addended by: Carmelina Dane on: 04/15/2021 11:19 AM     Modules accepted: Orders

## 2021-04-15 NOTE — Telephone Encounter (Signed)
Advised through [place] that prescription was sent to her pharmacy.

## 2021-04-15 NOTE — Telephone Encounter (Signed)
Patient is requesting medication Calcium Acetate 667 mgs. Patient not sure who prescribed medication.

## 2021-04-17 ENCOUNTER — Ambulatory Visit (INDEPENDENT_AMBULATORY_CARE_PROVIDER_SITE_OTHER): Payer: Medicare Other

## 2021-04-17 ENCOUNTER — Encounter (INDEPENDENT_AMBULATORY_CARE_PROVIDER_SITE_OTHER): Payer: Self-pay | Admitting: Specialist

## 2021-04-17 ENCOUNTER — Ambulatory Visit (INDEPENDENT_AMBULATORY_CARE_PROVIDER_SITE_OTHER): Payer: Medicare Other | Admitting: Specialist

## 2021-04-17 VITALS — BP 138/71 | HR 67 | Temp 97.2°F | Ht 63.0 in | Wt 164.0 lb

## 2021-04-17 DIAGNOSIS — N185 Chronic kidney disease, stage 5: Secondary | ICD-10-CM

## 2021-04-17 NOTE — Progress Notes (Signed)
[facility]    Chief Complaint   Patient presents with   . Post-op         History of Present Illness     NYSHIA LEAZER is a 75 y.o. female who presents for routine follow-up of left arm brachiocephalic AV fistula which has required recent angioplasty.  Patient has no complaints with respect to the fistula and states that fistula is working very well and there is no more bleeding.    Past Medical History     Past Medical History:   Diagnosis Date   . Abnormal vision     glasses   . Arthritis    . 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 Cataracts, bilateral    . Cellulitis and abscess of toe of left foot 08/06/2020   . Cellulitis of left leg 06/21/2013   . Deep venous thrombosis of distal lower extremity ~2013    left leg   . End-stage renal disease 2013    Hemodialysis MWF- [facility] [place]   . GERD (gastroesophageal reflux disease)    . Hyperlipidemia    . Low blood pressure reading 03/09/2021   . Pain and swelling of toe of left foot 08/07/2020   . Type 2 diabetes mellitus, controlled     IDDm/ oral med. FBS ~ 119-120 [facility] [place] Fraction        Allergies     Allergies   Allergen Reactions   . Penicillins Hives     Onset date: 11-24-2011   . Shrimp [Shellfish Allergy] Swelling     Also allergic to crab       Medications     Current Outpatient Medications on File Prior to Visit   Medication Sig Dispense Refill   . aspirin 81 MG tablet Take 81 mg by mouth in the morning.  .     . atorvastatin (LIPITOR) 20 MG tablet Take 1 tablet (20 mg total) by mouth daily (Patient taking differently: Take 20 mg by mouth every evening) 90 tablet 1   . B Complex-C-Folic Acid (Rena-Vite) Tab Take 1 tablet by mouth daily     . CALCIUM PO Take by mouth 3 (three) times daily     . gabapentin (NEURONTIN) 300 MG capsule TAKE 2 CAPSULES(600 MG) BY MOUTH THREE TIMES DAILY 540 capsule 1   . indomethacin (INDOCIN) 50 MG capsule Take 50 mg by mouth 2 (two) times daily with meals     . Levemir  FlexTouch 100 UNIT/ML injection pen INJECT 10 UNITS INTO THE SKIN EVERY EVENING 9 mL 1   . Magnesium Hydroxide (DULCOLAX PO) Take by mouth as needed     . midodrine (PROAMATINE) 10 MG tablet Take 10 mg by mouth as needed        . NovoLOG FlexPen 100 UNIT/ML injection pen ADMINISTER 5 UNITS UNDER THE SKIN THREE TIMES DAILY BEFORE MEALS 6 mL 2   . nystatin (MYCOSTATIN) cream Apply topically 2 (two) times daily 60 g 0   . SITagliptin (JANUVIA) 25 MG tablet Take 25 mg by mouth daily     . triamcinolone (KENALOG) 0.1 % cream Apply twice daily (Patient taking differently: 2 (two) times daily Itching under breasts) 80 g 0   . vitamin D (CHOLECALCIFEROL) 25 MCG (1000 UT) tablet Take 1,000 Units by mouth daily       No current facility-administered medications on file prior to visit.       Review of Systems  Constitutional: Negative for fevers and chills  Skin: No rash or lesions  Respiratory: Negative for cough, wheezing, or hemoptysis  Cardiovascular: as per HPI  Gastrointestinal: Negative for abdominal pain, nausea, vomiting and diarrhea  Musculoskeletal:  No arthritic symptoms  Genitourinary: Negative for dysuria  All other systems were reviewed and are negative except what is stated in the HPI    Physical Exam     Vitals:    04/17/21 1109   BP: 138/71   Pulse: 67   Temp: 97.2 F (36.2 C)   SpO2: 98%       Body mass index is 29.05 kg/m.    General:  Patient appears their stated age, well-nourished.  Alert and in no apparent distress.  Lungs: Respiratory effort unlabored, chest expansion symmetric.  Cardiac: RRR,  no JVD.   Extremities warm,   Abd: Soft, nondistended, nontender.    [id number] ROM in all 4 extremities, symmetric good thrill left arm AV fistula  Skin: Color appropriate for race, Skin warm, dry, no gangrene, no non healing ulcers, no varicose veins , no hyperpigmentation, no lipo-dermatosclerosis  Neuro: Good insight and judgment, oriented to person, place, and time CN II-XII intact, gross motor and  sensory intact       Labs     CBC:   WBC   Date/Time Value Ref Range Status   12/27/2020 10:30 AM 7.56 3.10 - 9.50 x10 3/uL Final     RBC   Date/Time Value Ref Range Status   12/27/2020 10:30 AM 3.52 (L) 3.90 - 5.10 x10 6/uL Final     Hgb   Date/Time Value Ref Range Status   12/27/2020 10:30 AM 11.3 (L) 11.4 - 14.8 g/dL Final     Hematocrit   Date/Time Value Ref Range Status   12/27/2020 10:30 AM 35.9 34.7 - 43.7 % Final     MCV   Date/Time Value Ref Range Status   12/27/2020 10:30 AM 102.0 (H) 78.0 - 96.0 fL Final     MCHC   Date/Time Value Ref Range Status   12/27/2020 10:30 AM 31.5 31.5 - 35.8 g/dL Final     RDW   Date/Time Value Ref Range Status   12/27/2020 10:30 AM 14 11 - 15 % Final     Platelets   Date/Time Value Ref Range Status   12/27/2020 10:30 AM 171 142 - 346 x10 3/uL Final       CMP:   Sodium   Date/Time Value Ref Range Status   12/27/2020 10:30 AM 137 136 - 145 mEq/L Final     Potassium   Date/Time Value Ref Range Status   12/27/2020 10:30 AM 4.9 3.5 - 5.1 mEq/L Final     Chloride   Date/Time Value Ref Range Status   12/27/2020 10:30 AM 90 (L) 100 - 111 mEq/L Final     CO2   Date/Time Value Ref Range Status   12/27/2020 10:30 AM 32 (H) 21 - 29 mEq/L Final     Glucose   Date/Time Value Ref Range Status   12/27/2020 10:30 AM 190 (H) 70 - 100 mg/dL Final     Comment:     ADA guidelines for diabetes mellitus:  Fasting:  Equal to or greater than 126 mg/dL  Random:   Equal to or greater than 200 mg/dL       BUN   Date/Time Value Ref Range Status   12/27/2020 10:30 AM 29.0 (H) 7.0 - 19.0 mg/dL Final     Protein, Total  Date/Time Value Ref Range Status   12/27/2020 10:30 AM 7.6 6.0 - 8.3 g/dL Final     Alkaline Phosphatase   Date/Time Value Ref Range Status   12/27/2020 10:30 AM 126 (H) 37 - 117 U/L Final     AST (SGOT)   Date/Time Value Ref Range Status   12/27/2020 10:30 AM 21 5 - 34 U/L Final     ALT   Date/Time Value Ref Range Status   12/27/2020 10:30 AM 16 0 - 55 U/L Final     Anion Gap   Date/Time  Value Ref Range Status   12/27/2020 10:30 AM 15.0 5.0 - 15.0 Final     Comment:     Calculated AGAP = Na - (CL + CO2)  Interpret with caution; calculated AGAP may not reflect patient's  true clinical status.         Lipid Panel   Cholesterol   Date/Time Value Ref Range Status   12/27/2020 10:30 AM 141 0 - 199 mg/dL Final     Triglycerides   Date/Time Value Ref Range Status   12/27/2020 10:30 AM 91 34 - 149 mg/dL Final     HDL   Date/Time Value Ref Range Status   12/27/2020 10:30 AM 57 40 - 9,999 mg/dL Final     Comment:     An HDL cholesterol <40 mg/dL is low and constitutes a  coronary heart disease risk factor, and HDL-C>59 mg/dL is  a negative risk factor for CHD.  Ref: American Heart Association; Circulation 2004         Coags:   PT   Date/Time Value Ref Range Status   02/20/2014 04:37 PM 25.5 (H) 12.6 - 15.0 sec Final     PT INR   Date/Time Value Ref Range Status   02/20/2014 04:37 PM 2.4 (H) 0.9 - 1.1 Final     Comment:     Recommended Ranges for Protime INR:    2.0-3.0 for most medical and surgical thromboembolic states    99991111 for artificial heart valves  INR result may not represent exact Warfarin dosing level during  the transition period from Heparin to Warfarin therapy.  Recommend close clinical monitoring.     PTT   Date/Time Value Ref Range Status   11/11/2013 05:13 PM 36 23 - 37 Final       Assessment and Plan       1. Chronic renal failure, stage 5  [place] [facility] Access Duplex Dopp       FAITHANN VALLESE is a 75 y.o. female who presents for routine follow-up of left arm brachiocephalic AV fistula which has required recent angioplasty.  Patient has no complaints with respect to the fistula and states that fistula is working very well and there is no more bleeding.  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  This was explained to the patient and has agreed    Shary Key, MD, [place]  Chief, Section of Vascular [facility]

## 2021-04-24 ENCOUNTER — Telehealth (INDEPENDENT_AMBULATORY_CARE_PROVIDER_SITE_OTHER): Payer: Self-pay | Admitting: Student in an Organized Health Care Education/Training Program

## 2021-04-24 DIAGNOSIS — R21 Rash and other nonspecific skin eruption: Secondary | ICD-10-CM

## 2021-04-24 NOTE — Telephone Encounter (Signed)
Refill Request:    Last Office Visit (Less than 6 months): 03-22-2021  (If greater than 6 months- NEEDS OV SCHEDULED)    Scheduled or declined:   Reason for declining OV:     Prescriber: White  Medication:   Name: Nystatin cream   Directions: 2 times a day  How many days left of meds: 0  Pharmacy: [facility] needed: no  Medication Questions/Comments: NO    (If this case is regarding a prior auth for a medication, please ask if there is a prior auth phone number that was provided and add it under Comments.)

## 2021-04-25 MED ORDER — NYSTATIN 100000 UNIT/GM EX CREA
TOPICAL_CREAM | Freq: Two times a day (BID) | CUTANEOUS | 0 refills | Status: AC
Start: 2021-04-25 — End: ?

## 2021-04-25 NOTE — Telephone Encounter (Signed)
Last visit 03/22/21. Refill sent to the pharmacy.

## 2021-04-28 ENCOUNTER — Encounter (INDEPENDENT_AMBULATORY_CARE_PROVIDER_SITE_OTHER): Payer: Self-pay

## 2021-04-29 ENCOUNTER — Encounter (INDEPENDENT_AMBULATORY_CARE_PROVIDER_SITE_OTHER): Payer: Self-pay

## 2021-05-01 ENCOUNTER — Other Ambulatory Visit (INDEPENDENT_AMBULATORY_CARE_PROVIDER_SITE_OTHER): Payer: Self-pay | Admitting: Student in an Organized Health Care Education/Training Program

## 2021-05-01 DIAGNOSIS — E1142 Type 2 diabetes mellitus with diabetic polyneuropathy: Secondary | ICD-10-CM

## 2021-05-21 ENCOUNTER — Other Ambulatory Visit (INDEPENDENT_AMBULATORY_CARE_PROVIDER_SITE_OTHER): Payer: Self-pay | Admitting: Student in an Organized Health Care Education/Training Program

## 2021-05-29 ENCOUNTER — Encounter (INDEPENDENT_AMBULATORY_CARE_PROVIDER_SITE_OTHER): Payer: Self-pay

## 2021-05-30 ENCOUNTER — Encounter (INDEPENDENT_AMBULATORY_CARE_PROVIDER_SITE_OTHER): Payer: Self-pay

## 2021-06-28 ENCOUNTER — Encounter (INDEPENDENT_AMBULATORY_CARE_PROVIDER_SITE_OTHER): Payer: Self-pay

## 2021-06-29 ENCOUNTER — Encounter (INDEPENDENT_AMBULATORY_CARE_PROVIDER_SITE_OTHER): Payer: Self-pay

## 2021-07-29 ENCOUNTER — Encounter (INDEPENDENT_AMBULATORY_CARE_PROVIDER_SITE_OTHER): Payer: Self-pay

## 2021-07-30 ENCOUNTER — Encounter (INDEPENDENT_AMBULATORY_CARE_PROVIDER_SITE_OTHER): Payer: Self-pay

## 2021-08-08 ENCOUNTER — Other Ambulatory Visit (INDEPENDENT_AMBULATORY_CARE_PROVIDER_SITE_OTHER): Payer: Self-pay | Admitting: Student in an Organized Health Care Education/Training Program

## 2021-08-08 ENCOUNTER — Other Ambulatory Visit: Payer: Self-pay

## 2021-08-08 DIAGNOSIS — E1142 Type 2 diabetes mellitus with diabetic polyneuropathy: Secondary | ICD-10-CM

## 2021-08-08 NOTE — Telephone Encounter (Signed)
Med renewed. Please schedule chronic care visit prior to next refill

## 2021-08-08 NOTE — Progress Notes (Signed)
08/08/21 1552   Pt Outreach/Care Plan Metrics   [employer] for Outreach MSSP   Outreach Status for Metrics listing Left Voicemail 1       Nurse Navigator called pt today to offer care management support. Pt did not answer. VM left with this NN's contact information and a request for a callback.      [id number] Marianna Fuss RN, BSN, CCM, MBA  Patient Care [place]  [phone]

## 2021-08-17 ENCOUNTER — Other Ambulatory Visit (INDEPENDENT_AMBULATORY_CARE_PROVIDER_SITE_OTHER): Payer: Self-pay | Admitting: Student in an Organized Health Care Education/Training Program

## 2021-08-17 DIAGNOSIS — E1121 Type 2 diabetes mellitus with diabetic nephropathy: Secondary | ICD-10-CM

## 2021-08-18 NOTE — Telephone Encounter (Signed)
Med renewed. Please schedule chronic care visit prior to next refill

## 2021-08-29 ENCOUNTER — Encounter (INDEPENDENT_AMBULATORY_CARE_PROVIDER_SITE_OTHER): Payer: Self-pay

## 2021-08-30 ENCOUNTER — Encounter (INDEPENDENT_AMBULATORY_CARE_PROVIDER_SITE_OTHER): Payer: Self-pay

## 2021-09-13 ENCOUNTER — Other Ambulatory Visit: Payer: Self-pay

## 2021-09-13 NOTE — Progress Notes (Signed)
09/13/21 1704   Pt Outreach/Care Plan Metrics   [employer] Grouping for Outreach MSSP   Outreach Status for Metrics listing Screened Not Appropriate;Spoke with Member/Care Giver       Pt in monthly ED list. Nurse Navigator spoke with  patient via phone to offer care management support. Pt said she has moved to [place]. Encouraged pt to establish care with a new PCP and any specialist care she needs in [place].       [id number] Marianna Fuss RN, BSN, CCM, MBA  Patient Care [place]  [phone]

## 2021-09-28 ENCOUNTER — Encounter (INDEPENDENT_AMBULATORY_CARE_PROVIDER_SITE_OTHER): Payer: Self-pay

## 2021-09-29 ENCOUNTER — Encounter (INDEPENDENT_AMBULATORY_CARE_PROVIDER_SITE_OTHER): Payer: Self-pay

## 2021-10-29 ENCOUNTER — Encounter (INDEPENDENT_AMBULATORY_CARE_PROVIDER_SITE_OTHER): Payer: Self-pay

## 2021-10-30 ENCOUNTER — Encounter (INDEPENDENT_AMBULATORY_CARE_PROVIDER_SITE_OTHER): Payer: Self-pay

## 2021-11-28 ENCOUNTER — Encounter (INDEPENDENT_AMBULATORY_CARE_PROVIDER_SITE_OTHER): Payer: Self-pay

## 2021-11-29 ENCOUNTER — Encounter (INDEPENDENT_AMBULATORY_CARE_PROVIDER_SITE_OTHER): Payer: Self-pay

## 2021-12-29 ENCOUNTER — Encounter (INDEPENDENT_AMBULATORY_CARE_PROVIDER_SITE_OTHER): Payer: Self-pay

## 2021-12-30 ENCOUNTER — Encounter (INDEPENDENT_AMBULATORY_CARE_PROVIDER_SITE_OTHER): Payer: Self-pay

## 2022-01-13 ENCOUNTER — Other Ambulatory Visit (INDEPENDENT_AMBULATORY_CARE_PROVIDER_SITE_OTHER): Payer: Self-pay | Admitting: Student in an Organized Health Care Education/Training Program

## 2022-01-13 DIAGNOSIS — E1142 Type 2 diabetes mellitus with diabetic polyneuropathy: Secondary | ICD-10-CM

## 2022-01-29 ENCOUNTER — Encounter (INDEPENDENT_AMBULATORY_CARE_PROVIDER_SITE_OTHER): Payer: Self-pay

## 2022-01-30 ENCOUNTER — Encounter (INDEPENDENT_AMBULATORY_CARE_PROVIDER_SITE_OTHER): Payer: Self-pay

## 2022-02-26 ENCOUNTER — Encounter (INDEPENDENT_AMBULATORY_CARE_PROVIDER_SITE_OTHER): Payer: Self-pay

## 2022-03-06 ENCOUNTER — Encounter (INDEPENDENT_AMBULATORY_CARE_PROVIDER_SITE_OTHER): Payer: Self-pay

## 2022-03-29 ENCOUNTER — Encounter (INDEPENDENT_AMBULATORY_CARE_PROVIDER_SITE_OTHER): Payer: Self-pay

## 2022-03-30 ENCOUNTER — Encounter (INDEPENDENT_AMBULATORY_CARE_PROVIDER_SITE_OTHER): Payer: Self-pay

## 2022-09-11 ENCOUNTER — Other Ambulatory Visit (INDEPENDENT_AMBULATORY_CARE_PROVIDER_SITE_OTHER): Payer: Self-pay | Admitting: Family Medicine

## 2022-11-05 ENCOUNTER — Other Ambulatory Visit: Payer: Self-pay

## 2022-11-05 NOTE — Progress Notes (Signed)
Ambulatory Care Management Progress Note     11/05/22 1634   Pt Outreach/Care Plan Metrics   Payor Grouping for Outreach MSSP   Outreach Status for [employer] Spoke with Member/Care Giver;Screened Not Appropriate  (Pt said [place] has moved out[place]of[place]state. Pt declined to update her address and PCP information in chart.)         Philippa Chester, RN, BSN, CCM, [facility]  Patient [place]  [phone]
